# Patient Record
Sex: Male | Born: 1950 | Race: Black or African American | Hispanic: No | Marital: Single | Smoking: Never smoker
Health system: Southern US, Community
[De-identification: ages and names within clinical notes are randomized; demographics above are authoritative.]

## PROBLEM LIST (undated history)

## (undated) DIAGNOSIS — J449 Chronic obstructive pulmonary disease, unspecified: Secondary | ICD-10-CM

## (undated) DIAGNOSIS — G319 Degenerative disease of nervous system, unspecified: Secondary | ICD-10-CM

## (undated) DIAGNOSIS — Z960 Presence of urogenital implants: Secondary | ICD-10-CM

## (undated) DIAGNOSIS — S78111A Complete traumatic amputation at level between right hip and knee, initial encounter: Secondary | ICD-10-CM

## (undated) DIAGNOSIS — Z978 Presence of other specified devices: Secondary | ICD-10-CM

## (undated) DIAGNOSIS — N184 Chronic kidney disease, stage 4 (severe): Secondary | ICD-10-CM

## (undated) DIAGNOSIS — I480 Paroxysmal atrial fibrillation: Secondary | ICD-10-CM

## (undated) DIAGNOSIS — I5020 Unspecified systolic (congestive) heart failure: Secondary | ICD-10-CM

## (undated) DIAGNOSIS — Z931 Gastrostomy status: Secondary | ICD-10-CM

## (undated) DIAGNOSIS — G8220 Paraplegia, unspecified: Secondary | ICD-10-CM

## (undated) DIAGNOSIS — Z930 Tracheostomy status: Secondary | ICD-10-CM

---

## 2017-01-22 ENCOUNTER — Emergency Department (HOSPITAL_COMMUNITY): Payer: Medicare Other

## 2017-01-22 ENCOUNTER — Inpatient Hospital Stay (HOSPITAL_COMMUNITY)
Admission: EM | Admit: 2017-01-22 | Discharge: 2017-01-25 | DRG: 698 | Disposition: A | Discharge status: Other Acute Inpatient Hospital | Payer: Medicare Other | Attending: Pulmonary Disease | Admitting: Pulmonary Disease

## 2017-01-22 DIAGNOSIS — J151 Pneumonia due to Pseudomonas: Secondary | ICD-10-CM | POA: Diagnosis not present

## 2017-01-22 DIAGNOSIS — Z79899 Other long term (current) drug therapy: Secondary | ICD-10-CM

## 2017-01-22 DIAGNOSIS — A419 Sepsis, unspecified organism: Secondary | ICD-10-CM | POA: Diagnosis present

## 2017-01-22 DIAGNOSIS — E872 Acidosis, unspecified: Secondary | ICD-10-CM

## 2017-01-22 DIAGNOSIS — D638 Anemia in other chronic diseases classified elsewhere: Secondary | ICD-10-CM | POA: Diagnosis not present

## 2017-01-22 DIAGNOSIS — Z930 Tracheostomy status: Secondary | ICD-10-CM

## 2017-01-22 DIAGNOSIS — G8250 Quadriplegia, unspecified: Secondary | ICD-10-CM | POA: Diagnosis not present

## 2017-01-22 DIAGNOSIS — N390 Urinary tract infection, site not specified: Secondary | ICD-10-CM

## 2017-01-22 DIAGNOSIS — D62 Acute posthemorrhagic anemia: Secondary | ICD-10-CM | POA: Diagnosis not present

## 2017-01-22 DIAGNOSIS — I482 Chronic atrial fibrillation: Secondary | ICD-10-CM | POA: Diagnosis present

## 2017-01-22 DIAGNOSIS — Y838 Other surgical procedures as the cause of abnormal reaction of the patient, or of later complication, without mention of misadventure at the time of the procedure: Secondary | ICD-10-CM | POA: Diagnosis present

## 2017-01-22 DIAGNOSIS — J440 Chronic obstructive pulmonary disease with acute lower respiratory infection: Secondary | ICD-10-CM | POA: Diagnosis not present

## 2017-01-22 DIAGNOSIS — D6489 Other specified anemias: Secondary | ICD-10-CM | POA: Diagnosis present

## 2017-01-22 DIAGNOSIS — N179 Acute kidney failure, unspecified: Secondary | ICD-10-CM | POA: Diagnosis not present

## 2017-01-22 DIAGNOSIS — Y95 Nosocomial condition: Secondary | ICD-10-CM | POA: Diagnosis not present

## 2017-01-22 DIAGNOSIS — R680 Hypothermia, not associated with low environmental temperature: Secondary | ICD-10-CM | POA: Diagnosis present

## 2017-01-22 DIAGNOSIS — N183 Chronic kidney disease, stage 3 (moderate): Secondary | ICD-10-CM | POA: Diagnosis present

## 2017-01-22 DIAGNOSIS — E039 Hypothyroidism, unspecified: Secondary | ICD-10-CM | POA: Diagnosis not present

## 2017-01-22 DIAGNOSIS — R6521 Severe sepsis with septic shock: Secondary | ICD-10-CM | POA: Diagnosis not present

## 2017-01-22 DIAGNOSIS — R739 Hyperglycemia, unspecified: Secondary | ICD-10-CM | POA: Diagnosis present

## 2017-01-22 DIAGNOSIS — R578 Other shock: Secondary | ICD-10-CM | POA: Diagnosis present

## 2017-01-22 DIAGNOSIS — L89159 Pressure ulcer of sacral region, unspecified stage: Secondary | ICD-10-CM | POA: Diagnosis present

## 2017-01-22 DIAGNOSIS — T83518A Infection and inflammatory reaction due to other urinary catheter, initial encounter: Principal | ICD-10-CM | POA: Diagnosis present

## 2017-01-22 DIAGNOSIS — I5022 Chronic systolic (congestive) heart failure: Secondary | ICD-10-CM | POA: Diagnosis present

## 2017-01-22 DIAGNOSIS — D72829 Elevated white blood cell count, unspecified: Secondary | ICD-10-CM

## 2017-01-22 DIAGNOSIS — Z7951 Long term (current) use of inhaled steroids: Secondary | ICD-10-CM

## 2017-01-22 DIAGNOSIS — T83511A Infection and inflammatory reaction due to indwelling urethral catheter, initial encounter: Secondary | ICD-10-CM

## 2017-01-22 DIAGNOSIS — J189 Pneumonia, unspecified organism: Secondary | ICD-10-CM

## 2017-01-22 DIAGNOSIS — G319 Degenerative disease of nervous system, unspecified: Secondary | ICD-10-CM | POA: Diagnosis not present

## 2017-01-22 DIAGNOSIS — L89610 Pressure ulcer of right heel, unstageable: Secondary | ICD-10-CM | POA: Diagnosis present

## 2017-01-22 DIAGNOSIS — J9621 Acute and chronic respiratory failure with hypoxia: Secondary | ICD-10-CM | POA: Diagnosis not present

## 2017-01-22 DIAGNOSIS — R1310 Dysphagia, unspecified: Secondary | ICD-10-CM | POA: Diagnosis present

## 2017-01-22 DIAGNOSIS — J9600 Acute respiratory failure, unspecified whether with hypoxia or hypercapnia: Secondary | ICD-10-CM

## 2017-01-22 DIAGNOSIS — R4182 Altered mental status, unspecified: Secondary | ICD-10-CM | POA: Diagnosis present

## 2017-01-22 DIAGNOSIS — Z931 Gastrostomy status: Secondary | ICD-10-CM

## 2017-01-22 DIAGNOSIS — L89314 Pressure ulcer of right buttock, stage 4: Secondary | ICD-10-CM | POA: Insufficient documentation

## 2017-01-22 DIAGNOSIS — J9690 Respiratory failure, unspecified, unspecified whether with hypoxia or hypercapnia: Secondary | ICD-10-CM

## 2017-01-22 DIAGNOSIS — E876 Hypokalemia: Secondary | ICD-10-CM | POA: Diagnosis present

## 2017-01-22 LAB — PROCALCITONIN
PROCALCITONIN: 12.21 ng/mL
Procalcitonin: 16.95 ng/mL

## 2017-01-22 LAB — CBC WITH DIFFERENTIAL/PLATELET
BASOS PCT: 0 %
Basophils Absolute: 0 10*3/uL (ref 0.0–0.1)
EOS ABS: 0.1 10*3/uL (ref 0.0–0.7)
EOS PCT: 1 %
HCT: 16.9 % — ABNORMAL LOW (ref 39.0–52.0)
HEMOGLOBIN: 4.6 g/dL — AB (ref 13.0–17.0)
LYMPHS PCT: 11 %
Lymphs Abs: 1.3 10*3/uL (ref 0.7–4.0)
MCH: 19.8 pg — ABNORMAL LOW (ref 26.0–34.0)
MCHC: 27.2 g/dL — ABNORMAL LOW (ref 30.0–36.0)
MCV: 72.8 fL — AB (ref 78.0–100.0)
Monocytes Absolute: 1.1 10*3/uL — ABNORMAL HIGH (ref 0.1–1.0)
Monocytes Relative: 9 %
NEUTROS PCT: 79 %
Neutro Abs: 9.4 10*3/uL — ABNORMAL HIGH (ref 1.7–7.7)
PLATELETS: 149 10*3/uL — AB (ref 150–400)
RBC: 2.32 MIL/uL — AB (ref 4.22–5.81)
RDW: 20.6 % — ABNORMAL HIGH (ref 11.5–15.5)
WBC: 11.9 10*3/uL — AB (ref 4.0–10.5)

## 2017-01-22 LAB — URINALYSIS, ROUTINE W REFLEX MICROSCOPIC
Bilirubin Urine: NEGATIVE
GLUCOSE, UA: NEGATIVE mg/dL
Ketones, ur: NEGATIVE mg/dL
NITRITE: NEGATIVE
Specific Gravity, Urine: 1.013 (ref 1.005–1.030)
Squamous Epithelial / LPF: NONE SEEN
pH: 8 (ref 5.0–8.0)

## 2017-01-22 LAB — PREPARE RBC (CROSSMATCH)

## 2017-01-22 LAB — GLUCOSE, CAPILLARY: GLUCOSE-CAPILLARY: 130 mg/dL — AB (ref 65–99)

## 2017-01-22 LAB — IRON AND TIBC
IRON: 55 ug/dL (ref 45–182)
Saturation Ratios: 35 % (ref 17.9–39.5)
TIBC: 158 ug/dL — ABNORMAL LOW (ref 250–450)
UIBC: 103 ug/dL

## 2017-01-22 LAB — PROTIME-INR
INR: 1.26
INR: 1.32
PROTHROMBIN TIME: 16.4 s — AB (ref 11.4–15.2)
Prothrombin Time: 15.9 seconds — ABNORMAL HIGH (ref 11.4–15.2)

## 2017-01-22 LAB — COMPREHENSIVE METABOLIC PANEL
ALBUMIN: 1.6 g/dL — AB (ref 3.5–5.0)
ALT: 43 U/L (ref 17–63)
AST: 59 U/L — AB (ref 15–41)
Alkaline Phosphatase: 255 U/L — ABNORMAL HIGH (ref 38–126)
Anion gap: 10 (ref 5–15)
BUN: 81 mg/dL — AB (ref 6–20)
CHLORIDE: 90 mmol/L — AB (ref 101–111)
CO2: 32 mmol/L (ref 22–32)
Calcium: 7.9 mg/dL — ABNORMAL LOW (ref 8.9–10.3)
Creatinine, Ser: 2.66 mg/dL — ABNORMAL HIGH (ref 0.61–1.24)
GFR calc Af Amer: 28 mL/min — ABNORMAL LOW (ref >60)
GFR calc non Af Amer: 24 mL/min — ABNORMAL LOW (ref >60)
GLUCOSE: 182 mg/dL — AB (ref 65–99)
POTASSIUM: 3.8 mmol/L (ref 3.5–5.1)
SODIUM: 132 mmol/L — AB (ref 135–145)
Total Bilirubin: 1.5 mg/dL — ABNORMAL HIGH (ref 0.3–1.2)
Total Protein: 7 g/dL (ref 6.5–8.1)

## 2017-01-22 LAB — I-STAT ARTERIAL BLOOD GAS, ED
Acid-Base Excess: 5 mmol/L — ABNORMAL HIGH (ref 0.0–2.0)
Bicarbonate: 32.8 mmol/L — ABNORMAL HIGH (ref 20.0–28.0)
O2 SAT: 63 %
PCO2 ART: 61.8 mmHg — AB (ref 32.0–48.0)
PO2 ART: 31 mmHg — AB (ref 83.0–108.0)
Patient temperature: 34.2
TCO2: 35 mmol/L (ref 0–100)
pH, Arterial: 7.319 — ABNORMAL LOW (ref 7.350–7.450)

## 2017-01-22 LAB — I-STAT CG4 LACTIC ACID, ED
Lactic Acid, Venous: 2.6 mmol/L (ref 0.5–1.9)
Lactic Acid, Venous: 3.08 mmol/L (ref 0.5–1.9)

## 2017-01-22 LAB — TROPONIN I: Troponin I: 0.11 ng/mL (ref <0.03)

## 2017-01-22 LAB — ABO/RH: ABO/RH(D): O POS

## 2017-01-22 LAB — LACTIC ACID, PLASMA
LACTIC ACID, VENOUS: 3 mmol/L — AB (ref 0.5–1.9)
Lactic Acid, Venous: 2.2 mmol/L (ref 0.5–1.9)

## 2017-01-22 LAB — FOLATE: FOLATE: 60.6 ng/mL (ref >5.9)

## 2017-01-22 LAB — VITAMIN B12: VITAMIN B 12: 1148 pg/mL — AB (ref 180–914)

## 2017-01-22 LAB — POC OCCULT BLOOD, ED: Fecal Occult Bld: POSITIVE — AB

## 2017-01-22 LAB — MRSA PCR SCREENING: MRSA by PCR: NEGATIVE

## 2017-01-22 LAB — PHOSPHORUS: PHOSPHORUS: 2.4 mg/dL — AB (ref 2.5–4.6)

## 2017-01-22 LAB — APTT
aPTT: 40 seconds — ABNORMAL HIGH (ref 24–36)
aPTT: 41 seconds — ABNORMAL HIGH (ref 24–36)

## 2017-01-22 LAB — MAGNESIUM: Magnesium: 3.3 mg/dL — ABNORMAL HIGH (ref 1.7–2.4)

## 2017-01-22 LAB — CORTISOL: Cortisol, Plasma: 16.6 ug/dL

## 2017-01-22 LAB — FERRITIN: Ferritin: >7500 ng/mL — ABNORMAL HIGH (ref 24–336)

## 2017-01-22 MED ORDER — PANTOPRAZOLE SODIUM 40 MG IV SOLR
40.0000 mg | Freq: Once | INTRAVENOUS | Status: DC
  Administered 2017-01-22: 40 mg via INTRAVENOUS
  Filled 2017-01-22: qty 40

## 2017-01-22 MED ORDER — ORAL CARE MOUTH RINSE
15.0000 mL | Freq: Four times a day (QID) | OROMUCOSAL | Status: DC
  Administered 2017-01-23 – 2017-01-25 (×11): 15 mL via OROMUCOSAL

## 2017-01-22 MED ORDER — NOREPINEPHRINE BITARTRATE 1 MG/ML IV SOLN
0.0000 ug/min | Freq: Once | INTRAVENOUS | Status: AC
  Administered 2017-01-22: 2 ug/min via INTRAVENOUS
  Filled 2017-01-22: qty 4

## 2017-01-22 MED ORDER — VANCOMYCIN HCL IN DEXTROSE 1-5 GM/200ML-% IV SOLN
1000.0000 mg | INTRAVENOUS | Status: DC
  Administered 2017-01-23: 1000 mg via INTRAVENOUS
  Filled 2017-01-22: qty 200

## 2017-01-22 MED ORDER — SODIUM CHLORIDE 0.9 % IV SOLN
250.0000 mL | INTRAVENOUS | Status: DC | PRN
  Administered 2017-01-22: 250 mL via INTRAVENOUS

## 2017-01-22 MED ORDER — PIPERACILLIN-TAZOBACTAM 3.375 G IVPB 30 MIN
3.3750 g | Freq: Once | INTRAVENOUS | Status: AC
  Administered 2017-01-22: 3.375 g via INTRAVENOUS

## 2017-01-22 MED ORDER — FERROUS SULFATE 300 (60 FE) MG/5ML PO SYRP
300.0000 mg | ORAL_SOLUTION | Freq: Every day | ORAL | Status: DC
  Administered 2017-01-23 – 2017-01-25 (×3): 300 mg
  Filled 2017-01-22 (×3): qty 5

## 2017-01-22 MED ORDER — SODIUM CHLORIDE 0.9 % IV SOLN: 500.0000 mL | INTRAVENOUS | Status: DC | PRN

## 2017-01-22 MED ORDER — VANCOMYCIN HCL 10 G IV SOLR
2000.0000 mg | Freq: Once | INTRAVENOUS | Status: AC
  Administered 2017-01-22: 2000 mg via INTRAVENOUS
  Filled 2017-01-22: qty 2000

## 2017-01-22 MED ORDER — VANCOMYCIN HCL 10 G IV SOLR: 20.0000 mg/kg | Freq: Once | INTRAVENOUS | Status: DC

## 2017-01-22 MED ORDER — PIPERACILLIN-TAZOBACTAM 3.375 G IVPB
3.3750 g | Freq: Three times a day (TID) | INTRAVENOUS | Status: DC
  Administered 2017-01-22 – 2017-01-25 (×8): 3.375 g via INTRAVENOUS
  Filled 2017-01-22 (×9): qty 50

## 2017-01-22 MED ORDER — SODIUM CHLORIDE 0.9 % IV SOLN: INTRAVENOUS | Status: DC

## 2017-01-22 MED ORDER — CHLORHEXIDINE GLUCONATE 0.12% ORAL RINSE (MEDLINE KIT)
15.0000 mL | Freq: Two times a day (BID) | OROMUCOSAL | Status: DC
  Administered 2017-01-22 – 2017-01-25 (×6): 15 mL via OROMUCOSAL

## 2017-01-22 MED ORDER — PANTOPRAZOLE SODIUM 40 MG IV SOLR
40.0000 mg | Freq: Every day | INTRAVENOUS | Status: DC
  Administered 2017-01-22 – 2017-01-23 (×2): 40 mg via INTRAVENOUS
  Filled 2017-01-22 (×2): qty 40

## 2017-01-22 MED ORDER — INSULIN ASPART 100 UNIT/ML ~~LOC~~ SOLN
0.0000 [IU] | SUBCUTANEOUS | Status: DC
  Administered 2017-01-22 – 2017-01-25 (×5): 2 [IU] via SUBCUTANEOUS

## 2017-01-22 MED ORDER — LACTATED RINGERS IV SOLN
INTRAVENOUS | Status: DC
  Administered 2017-01-22 – 2017-01-24 (×4): via INTRAVENOUS

## 2017-01-22 MED ORDER — SODIUM CHLORIDE 0.9 % IV BOLUS (SEPSIS)
2000.0000 mL | Freq: Once | INTRAVENOUS | Status: AC
  Administered 2017-01-22: 2000 mL via INTRAVENOUS

## 2017-01-22 MED ORDER — SODIUM CHLORIDE 0.9 % IV SOLN
Freq: Once | INTRAVENOUS | Status: AC
  Administered 2017-01-23: 03:00:00 via INTRAVENOUS

## 2017-01-22 MED ORDER — SODIUM CHLORIDE 0.9 % IV BOLUS (SEPSIS): 1700.0000 mL | Freq: Once | INTRAVENOUS | Status: DC

## 2017-01-22 MED ORDER — NOREPINEPHRINE BITARTRATE 1 MG/ML IV SOLN
0.0000 ug/min | INTRAVENOUS | Status: DC
  Administered 2017-01-23: 12 ug/min via INTRAVENOUS
  Filled 2017-01-22: qty 4

## 2017-01-22 MED ORDER — PIPERACILLIN-TAZOBACTAM 4.5 G IVPB
3.3750 g | Freq: Once | INTRAVENOUS | Status: DC
  Filled 2017-01-22: qty 100

## 2017-01-22 MED ORDER — LEVOTHYROXINE SODIUM 75 MCG PO TABS
75.0000 ug | ORAL_TABLET | Freq: Every day | ORAL | Status: DC
  Administered 2017-01-23 – 2017-01-25 (×3): 75 ug
  Filled 2017-01-22 (×3): qty 1

## 2017-01-22 NOTE — Progress Notes (Signed)
Pt stuck for ABG, sample was venous, CCM NP aware. Increased rate to 24 due to increased CO2. Will cont to monitor

## 2017-01-22 NOTE — Progress Notes (Signed)
CRITICAL VALUE ALERT  Critical value received:  Troponin 0.11, Lactic acid 3.0  Date of notification:  01/22/2017  Time of notification:  21:40  Critical value read back:Yes.    Nurse who received alert:  BShepherd, RN  MD notified (1st page):  Sommer  Time of first page:  21:55  MD notified (2nd page):  Time of second page:  Responding MD:  Arsenio LoaderSommer  Time MD responded:  21:55

## 2017-01-22 NOTE — ED Provider Notes (Signed)
I saw and evaluated the Craig Oneal, reviewed the resident's note and I agree with the findings and plan with the following exceptions.   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 Craig Oneal. Had decreased mental status over the last couple days was found to have gram-negative rods in his urine had not been started on antibiotic she had. Arrival here the Craig Oneal is tachycardic, hypotensive, hypothermic. He is minimally responsive to verbal stimuli but will follow commands when he is awake. His extremities are slightly cool to touch but not cold. Has mild bibasilar crackles. Abdomen is distended and tympanic. We'll start treatment for sepsis. Likely urinary source however has multiple other abnormalities so we'll do broad-spectrum antibiotics including vancomycin and Zosyn. We'll resuscitate with 30 mL/kg of normal saline Craig Oneal has a PICC line which is recently placed so I doubt that it is infected but can likely be used for pressors if needed.  Craig Oneal is a full code for now but family would consider having a limited scope of care depending on the clinical situation.  Craig Oneal will require admission to ICU for further management once resuscitation efforts have begun.   CRITICAL CARE Performed by: Marily MemosMesner, Deshanae Lindo Total critical care time: 32 minutes Critical care time was exclusive of separately billable procedures and treating other patients. Critical care was necessary to treat or prevent imminent or life-threatening deterioration. Critical care was time spent personally by me on the following activities: development of treatment plan with Craig Oneal and/or surrogate as well as nursing, discussions with consultants, evaluation of Craig Oneal's response to treatment, examination of Craig Oneal, obtaining history from Craig Oneal or surrogate, ordering and performing treatments and interventions, ordering and review  of laboratory studies, ordering and review of radiographic studies, pulse oximetry and re-evaluation of Craig Oneal's condition.    EKG Interpretation  Date/Time:  Saturday Jan 22 2017 16:19:09 EDT Ventricular Rate:  104 PR Interval:    QRS Duration: 96 QT Interval:  381 QTC Calculation: 502 R Axis:   8 Text Interpretation:  Atrial fibrillation Ventricular premature complex Low voltage, precordial leads Nonspecific T abnormalities, lateral leads Prolonged QT interval Confirmed by Marily MemosMesner, Tory Septer [phone]) on 01/22/2017 6:05:28 PM         Harol Shabazz, Barbara CowerJason, MD 01/22/17 2237

## 2017-01-22 NOTE — ED Provider Notes (Signed)
[facility] DEPT Provider Note   CSN: [id number] Arrival date & time: 01/22/17  1606     History   Chief Complaint Chief Complaint  Patient presents with  . Urinary Tract Infection    HPI Craig See. is a 66 y.o. male.  Patient with history of quadriplegia and currently been staying at [facility] and transferred to [facility] following three days of fever and found to have low blood pressure today. Patient had PICC line place three days ago and antibiotics started for likely UTI. Patient usually alert and able to follow commands and today with decreased level on consciousness.    The history is provided by the EMS personnel and the nursing home.  Illness  The current episode started more than 2 days ago. The problem occurs daily. The problem has been gradually worsening. Nothing aggravates the symptoms. Nothing relieves the symptoms.    No past medical history on file.  Patient Active Problem List   Diagnosis Date Noted  . Septic shock ([place]) 01/22/2017    No past surgical history on file.     Home Medications    Prior to Admission medications   Medication Sig Start Date End Date Taking? Authorizing Provider  acetaminophen (TYLENOL) 325 MG tablet Place 650 mg into feeding tube.   Yes [provider]  aluminum-magnesium hydroxide 200-200 MG/5ML suspension Place 30 mLs into feeding tube.   Yes [provider]  atorvastatin (LIPITOR) 10 MG tablet Place 10 mg into feeding tube.   Yes [provider]  baclofen (LIORESAL) 5 mg TABS tablet Place 5 mg into feeding tube.   Yes [provider]  budesonide (PULMICORT) 0.25 MG/2ML nebulizer solution 0.25 mg.   Yes [provider]  chlorhexidine (PERIDEX) 0.12 % solution Use as directed 15 mLs in the mouth or throat.   Yes [provider]  clonazePAM (KLONOPIN) 0.5 MG tablet Place 0.25 mg into feeding tube.   Yes [provider]  Darbepoetin Alfa  (ARANESP) 100 MCG/0.5ML SOSY injection Inject 100 mcg into the skin.   Yes [provider]  Docusate Sodium 100 MG capsule Place 100 mg into feeding tube.   Yes [provider]  ferrous sulfate 300 (60 Fe) MG/5ML syrup Place 300 mg into feeding tube.   Yes [provider]  fluticasone (FLONASE) 50 MCG/ACT nasal spray Place 2 sprays into both nostrils.   Yes [provider]  furosemide (LASIX) 20 MG tablet Place 20 mg into feeding tube.   Yes [provider]  ipratropium-albuterol (DUONEB) 0.5-2.5 (3) MG/3ML SOLN Take 3 mLs by nebulization.   Yes [provider]  levothyroxine (SYNTHROID, LEVOTHROID) 75 MCG tablet Place 75 mcg into feeding tube.   Yes [provider]  loperamide (IMODIUM A-D) 2 MG tablet Place 2 mg into feeding tube.   Yes [provider]  loratadine (CLARITIN) 10 MG tablet Place 10 mg into feeding tube.   Yes [provider]  metoprolol tartrate (LOPRESSOR) 25 MG tablet Place 25 mg into feeding tube.   Yes [provider]  montelukast (SINGULAIR) 10 MG tablet Place 10 mg into feeding tube.   Yes [provider]  Multiple Vitamin (MULTIVITAMIN WITH MINERALS) TABS tablet Place 1 tablet into feeding tube.   Yes [provider]  pantoprazole sodium (PROTONIX) 40 mg/20 mL PACK Place 40 mg into feeding tube.   Yes [provider]  polyethylene glycol (MIRALAX / GLYCOLAX) packet Place 17 g into feeding tube.  Yes [provider]  potassium chloride in dextrose 5 % and 0.45% NaCl 1,000 mL Inject into the vein See admin instructions. Order date 01/20/17 - use 75 ml/hr intravenously   Yes [provider]  Protein POWD    Yes [provider]  Skin Protectants, Misc. (EUCERIN) cream Apply 1 application topically.   Yes [provider]  Vitamin D, Ergocalciferol, (DRISDOL) 50000 units CAPS capsule Place 50,000 Units into feeding tube.   Yes  [provider]    Family History No family history on file.  Social History Social History  Substance Use Topics  . Smoking status: Not on file  . Smokeless tobacco: Not on file  . Alcohol use Not on file     Allergies   Patient has no known allergies.   Review of Systems Review of Systems  Unable to perform ROS: Acuity of condition     Physical Exam Updated Vital Signs  ED Triage Vitals  Enc Vitals Group     BP 01/22/17 1611 (!) 74/47     Pulse Rate 01/22/17 1611 100     Resp 01/22/17 1611 15     Temp 01/22/17 1611 (!) 94.4 F (34.7 C)     Temp Source 01/22/17 1611 Rectal     SpO2 01/22/17 1608 100 %     Weight 01/22/17 1608 191 lb (86.6 kg)     Height 01/22/17 1608 5' 8"  (1.727 m)     Head Circumference --      Peak Flow --      Pain Score --      Pain Loc --      Pain Edu? --      Excl. in [place]? --     Physical Exam  Constitutional: He appears distressed.  HENT:  Head: Normocephalic and atraumatic.  Eyes: EOM are normal. Pupils are equal, round, and reactive to light.  Neck: Normal range of motion. Neck supple.  Trach in place on home vent settings  Cardiovascular: Normal heart sounds and intact distal pulses.  Tachycardia present.   Pulmonary/Chest: Breath sounds normal. No respiratory distress. He has no wheezes.  Abdominal: Soft. He exhibits no distension.  Musculoskeletal: He exhibits no edema.  Neurological: He is alert. GCS eye subscore is 4. GCS verbal subscore is 5. GCS motor subscore is 6.  Skin: Capillary refill takes less than 2 seconds. No rash noted. He is diaphoretic.     ED Treatments / Results  Labs (all labs ordered are listed, but only abnormal results are displayed) Labs Reviewed  COMPREHENSIVE METABOLIC PANEL - Abnormal; Notable for the following:       Result Value   Sodium 132 (*)    Chloride 90 (*)    Glucose, Bld 182 (*)    BUN 81 (*)    Creatinine, Ser 2.66 (*)    Calcium 7.9 (*)    Albumin 1.6 (*)    AST  59 (*)    Alkaline Phosphatase 255 (*)    Total Bilirubin 1.5 (*)    GFR calc non Af Amer 24 (*)    GFR calc Af Amer 28 (*)    All other components within normal limits  CBC WITH DIFFERENTIAL/PLATELET - Abnormal; Notable for the following:    WBC 11.9 (*)    RBC 2.32 (*)    Hemoglobin 4.6 (*)    HCT 16.9 (*)    MCV 72.8 (*)    MCH 19.8 (*)    MCHC 27.2 (*)  RDW 20.6 (*)    Platelets 149 (*)    Neutro Abs 9.4 (*)    Monocytes Absolute 1.1 (*)    All other components within normal limits  PROTIME-INR - Abnormal; Notable for the following:    Prothrombin Time 15.9 (*)    All other components within normal limits  URINALYSIS, ROUTINE W REFLEX MICROSCOPIC - Abnormal; Notable for the following:    Color, Urine AMBER (*)    APPearance TURBID (*)    Hgb urine dipstick SMALL (*)    Protein, ur >=300 (*)    Leukocytes, UA LARGE (*)    Bacteria, UA MANY (*)    All other components within normal limits  APTT - Abnormal; Notable for the following:    aPTT 40 (*)    All other components within normal limits  LACTIC ACID, PLASMA - Abnormal; Notable for the following:    Lactic Acid, Venous 3.0 (*)    All other components within normal limits  LACTIC ACID, PLASMA - Abnormal; Notable for the following:    Lactic Acid, Venous 2.2 (*)    All other components within normal limits  TROPONIN I - Abnormal; Notable for the following:    Troponin I 0.11 (*)    All other components within normal limits  PROTIME-INR - Abnormal; Notable for the following:    Prothrombin Time 16.4 (*)    All other components within normal limits  APTT - Abnormal; Notable for the following:    aPTT 41 (*)    All other components within normal limits  MAGNESIUM - Abnormal; Notable for the following:    Magnesium 3.3 (*)    All other components within normal limits  PHOSPHORUS - Abnormal; Notable for the following:    Phosphorus 2.4 (*)    All other components within normal limits  VITAMIN B12 - Abnormal;  Notable for the following:    Vitamin B-12 1,148 (*)    All other components within normal limits  IRON AND TIBC - Abnormal; Notable for the following:    TIBC 158 (*)    All other components within normal limits  FERRITIN - Abnormal; Notable for the following:    Ferritin >7,500 (*)    All other components within normal limits  GLUCOSE, CAPILLARY - Abnormal; Notable for the following:    Glucose-Capillary 130 (*)    All other components within normal limits  CBC - Abnormal; Notable for the following:    WBC 15.4 (*)    RBC 3.10 (*)    Hemoglobin 6.6 (*)    HCT 23.2 (*)    MCV 74.8 (*)    MCH 21.3 (*)    MCHC 28.4 (*)    RDW 20.2 (*)    All other components within normal limits  I-STAT CG4 LACTIC ACID, ED - Abnormal; Notable for the following:    Lactic Acid, Venous 2.60 (*)    All other components within normal limits  I-STAT ARTERIAL BLOOD GAS, ED - Abnormal; Notable for the following:    pH, Arterial 7.319 (*)    pCO2 arterial 61.8 (*)    pO2, Arterial 31.0 (*)    Bicarbonate 32.8 (*)    Acid-Base Excess 5.0 (*)    All other components within normal limits  POC OCCULT BLOOD, ED - Abnormal; Notable for the following:    Fecal Occult Bld POSITIVE (*)    All other components within normal limits  I-STAT CG4 LACTIC ACID, ED - Abnormal; Notable for the  following:    Lactic Acid, Venous 3.08 (*)    All other components within normal limits  CULTURE, BLOOD (ROUTINE X 2)  CULTURE, BLOOD (ROUTINE X 2)  CULTURE, RESPIRATORY (NON-EXPECTORATED)  MRSA PCR SCREENING  PROCALCITONIN  PROCALCITONIN  CORTISOL  HIV ANTIBODY (ROUTINE TESTING)  PROCALCITONIN  URINALYSIS, ROUTINE W REFLEX MICROSCOPIC  BLOOD GAS, ARTERIAL  CBC  BASIC METABOLIC PANEL  BLOOD GAS, ARTERIAL  MAGNESIUM  PHOSPHORUS  FOLATE  SAVE SMEAR  TYPE AND SCREEN  ABO/RH  PREPARE RBC (CROSSMATCH)    EKG  EKG Interpretation  Date/Time:  Saturday Jan 22 2017 16:19:09 EDT Ventricular Rate:  104 PR  Interval:    QRS Duration: 96 QT Interval:  381 QTC Calculation: 502 R Axis:   8 Text Interpretation:  Atrial fibrillation Ventricular premature complex Low voltage, precordial leads Nonspecific T abnormalities, lateral leads Prolonged QT interval Confirmed by Merrily Pew [phone]) on 01/22/2017 6:05:28 PM       Radiology Dg Abdomen 1 View  Result Date: 01/22/2017 CLINICAL DATA:  Fever for 2 days.  UTI.  Sepsis. EXAM: ABDOMEN - 1 VIEW COMPARISON:  None. FINDINGS: Normal bowel gas pattern. Gastrostomy tube projects in the central abdomen over the superior aspect of the gastric air shadow. Soft tissues are unremarkable.  No acute skeletal abnormality. IMPRESSION: No acute findings.  No bowel obstruction. Electronically Signed   By: Lajean Manes M.D.   On: 01/22/2017 18:10   Dg Chest Port 1 View  Result Date: 01/22/2017 CLINICAL DATA:  Fever EXAM: PORTABLE CHEST 1 VIEW COMPARISON:  None. FINDINGS: Cardiac shadow is enlarged. Right-sided PICC line is noted in the mid right atrium. Tracheostomy tube is noted in satisfactory position. Diffuse bilateral perihilar density which may be related to pulmonary edema or diffuse bilateral infiltrates. No focal confluent infiltrate is seen. No bony abnormality is noted. IMPRESSION: Diffuse perihilar densities consistent with infiltrate/edema. Follow-up imaging is recommended. Electronically Signed   By: Inez Catalina M.D.   On: 01/22/2017 18:09    Procedures Procedures (including critical care time)  Medications Ordered in ED Medications  0.9 %  sodium chloride infusion (250 mLs Intravenous New Bag/Given 01/22/17 2200)  insulin aspart (novoLOG) injection 0-15 Units (2 Units Subcutaneous Given 01/22/17 2059)  pantoprazole (PROTONIX) injection 40 mg (40 mg Intravenous Given 01/22/17 2229)  lactated ringers infusion ( Intravenous Rate/Dose Verify 01/22/17 2100)  0.9 %  sodium chloride infusion (not administered)  piperacillin-tazobactam (ZOSYN) IVPB 3.375 g  (3.375 g Intravenous New Bag/Given 01/22/17 2230)  vancomycin (VANCOCIN) IVPB 1000 mg/200 mL premix (not administered)  levothyroxine (SYNTHROID, LEVOTHROID) tablet 75 mcg (not administered)  ferrous sulfate 300 (60 Fe) MG/5ML syrup 300 mg (not administered)  chlorhexidine gluconate (MEDLINE KIT) (PERIDEX) 0.12 % solution 15 mL (15 mLs Mouth Rinse Given 01/22/17 2214)  MEDLINE mouth rinse (not administered)  norepinephrine (LEVOPHED) 4 mg in dextrose 5 % 250 mL (0.016 mg/mL) infusion (not administered)  0.9 %  sodium chloride infusion (not administered)  vancomycin (VANCOCIN) 2,000 mg in sodium chloride 0.9 % 500 mL IVPB (0 mg Intravenous Stopped 01/22/17 1952)  sodium chloride 0.9 % bolus 2,000 mL (0 mLs Intravenous Stopped 01/22/17 1758)  piperacillin-tazobactam (ZOSYN) IVPB 3.375 g (0 g Intravenous Stopped 01/22/17 1733)  norepinephrine (LEVOPHED) 4 mg in dextrose 5 % 250 mL (0.016 mg/mL) infusion (12 mcg/min Intravenous Rate/Dose Change 01/22/17 2218)     Initial Impression / Assessment and Plan / ED Course  I have reviewed the triage vital signs and the nursing notes.  Pertinent labs & imaging results that were available during my care of the patient were reviewed by me and considered in my medical decision making (see chart for details).     Craig J Isay Perleberg. is a 66 year old male with history of spinal cerebellar ataxia status post trach, PEG, chronic indwelling Foley catheter who presents to the ED from outside long-term facility for hypotension and urinary tract infection. Patient's vitals at time of arrival to the ED are significant for tachycardia, hypotension, hypothermia and concerned for severe sepsis/septic shock. According to EMS patient has had fever for the last several days and had a PICC placed 3 days ago and found to have urinary tract infection with gram-negative rods. Patient was to be started on Zosyn today but never got antibiotics and sent here as his blood pressure  decreased. Patient with chronic indwelling Foley catheter. Patient alert, able to follow commands. Overall exam is unremarkable, patient is on home vent settings. Patient has chronic right foot wound. Contacted outside facility and states that patient has been sick for the last several days but unaware of what antibiotics patient has been on. Concern for severe sepsis and sepsis order set ordered. Code sepsis called. Patient given 30 mL/kg of normal saline, empirically started on IV vancomycin and Zosyn. Urine studies, blood cultures, chest x-ray ordered.  Patient with chest x-ray showing signs concerning for edema versus infiltrates. Patient with mild leukocytosis at 11.9 and lactic acidosis at 2.6. Patient also with signs of urinary tract infection with many leukocytes and many bacteria. Patient with hemoglobin of 4.6 and emergently given 2 units of packed red blood cells. No grossly melanotic or bloody stool on exam however hemoccult was positive. Patient was given IV Protonix. After 30 mL of fluid per kilogram patient still hypotensive and was started on IV Levophed. Type and screen was sent. Facility was recalled and state that hemoglobin yesterday was 8.6. Patient has history of chronic anemia however concern for anemia chronic illness versus acute GI process. Patient had elevated troponin and EKG show atrial fibrillation with no signs of acute anemia. Troponin elevation likely from demand. Patient with improved blood pressure following blood products and levophed. Patient admitted to the medical ICU for further care. Patient with repeat lactic acidosis increased to 3.0. Patient transferred to ICU in hemodynamically stable but patient in critical condition.   Final Clinical Impressions(s) / ED Diagnoses   Final diagnoses:  Sepsis ([place])  Septic shock ([place])  Urinary tract infection associated with catheterization of urinary tract, unspecified indwelling urinary catheter type, initial encounter ([place])   HAP (hospital-acquired pneumonia)  Leukocytosis, unspecified type  Lactic acidosis  AKI (acute kidney injury) ([place])  Acute blood loss anemia    New Prescriptions Current Discharge Medication List       Lennice Sites, DO 01/22/17 2338    Mesner, Corene Cornea, MD 01/23/17 1746

## 2017-01-22 NOTE — ED Notes (Signed)
MD notified of pt hemoglobin, ordered 2 units of emergency release.

## 2017-01-22 NOTE — ED Notes (Signed)
Both sets of blood cultures collected early today at 1630.  Duplicate order put in and collected in error.

## 2017-01-22 NOTE — H&P (Addendum)
PULMONARY / CRITICAL CARE MEDICINE   Name: Craig Oneal MRN: [id number] DOB: 09/27/1950    ADMISSION DATE:  01/22/2017 CONSULTATION DATE:  01/22/17  REFERRING MD:  Carolynn ServeJ Mesner MD  CHIEF COMPLAINT:  Septic shock  HISTORY OF PRESENT ILLNESS:   66 year old with history of cerebellar atrophy, chronic trach, PEG patient transferred from [facility] with 3 days of fever, hypotension. Noted to have positive UA with gram-negative rods at [facility] and he was started on zosyn. In the ED patient noted to be tachycardic, hypotensive, hypothermic altered mental status. Started on [employer] Zosyn. Labs significant for hemoglobin of 4.6, lactic acid 2.6, creatinine 2.66. 30cc/kg  fluid bolus given and PRBC ordered. PCCM called for admission.  PAST MEDICAL HISTORY :  He  has no past medical history on file.  PAST SURGICAL HISTORY: He  has no past surgical history on file.  No Known Allergies  No current facility-administered medications on file prior to encounter.    No current outpatient prescriptions on file prior to encounter.    FAMILY HISTORY:  His has no family status information on file.    SOCIAL HISTORY: He    REVIEW OF SYSTEMS:   Unable to obtain as patient is on trach, vented  SUBJECTIVE:    VITAL SIGNS: BP (!) 79/58   Pulse 88   Temp (!) 93.6 F (34.2 C)   Resp 17   Ht 5\' 8"  (1.727 m)   Wt 191 lb (86.6 kg)   SpO2 99%   BMI 29.04 kg/m   HEMODYNAMICS:   VENTILATOR SETTINGS: Vent Mode: PRVC FiO2 (%):  [35 %] 35 % Set Rate:  [12 bmp] 12 bmp Vt Set:  [500 mL] 500 mL PEEP:  [5 cmH20] 5 cmH20 Plateau Pressure:  [9 cmH20] 9 cmH20  INTAKE / OUTPUT: No intake/output data recorded.  PHYSICAL EXAMINATION: Gen:      No acute distress, obese HEENT:  EOMI, sclera anicteric Neck:     No masses; no thyromegaly Lungs:    Clear to auscultation bilaterally; normal respiratory effort CV:         Regular rate and rhythm; no murmurs Abd:      Distended + bowel sounds; soft,  non-tender; no palpable masses Ext:    1-2+ edema; adequate peripheral perfusion Skin:      Warm and dry; no rash Neuro: Awake, obese simple commands, nonfocal  LABS:  BMET  Recent Labs Lab 01/22/17 1626  NA 132*  K 3.8  CL 90*  CO2 32  BUN 81*  CREATININE 2.66*  GLUCOSE 182*    Electrolytes  Recent Labs Lab 01/22/17 1626  CALCIUM 7.9*    CBC  Recent Labs Lab 01/22/17 1626  WBC 11.9*  HGB 4.6*  HCT 16.9*  PLT 149*    Coag's  Recent Labs Lab 01/22/17 1626  APTT 40*  INR 1.26    Sepsis Markers  Recent Labs Lab 01/22/17 1626 01/22/17 1642  LATICACIDVEN  --  2.60*  PROCALCITON 16.95  --     ABG No results for input(s): PHART, PCO2ART, PO2ART in the last 168 hours.  Liver Enzymes  Recent Labs Lab 01/22/17 1626  AST 59*  ALT 43  ALKPHOS 255*  BILITOT 1.5*  ALBUMIN 1.6*    Cardiac Enzymes No results for input(s): TROPONINI, PROBNP in the last 168 hours.  Glucose No results for input(s): GLUCAP in the last 168 hours.  Imaging No results found.   STUDIES:  Chest x-ray 5/19> trach in place, dense bilateral  infiltrates suspicious for edema versus pneumonia Abdominal x-ray 5/19> no obstruction  CULTURES: Bcx ([facility]) 5/16 > Negative Ucx ([facility]) 5/17 > GNR  Blood cultures 5/19 > Urine cultures 5/19 > Sputum cultures 5/19 >  ANTIBIOTICS: Vancomycin 5/19> Zosyn 5/19>   SIGNIFICANT EVENTS: 5/19> admitted from [facility]  LINES/TUBES:   DISCUSSION: 66 year old with cerebral atrophy, chronic trach PEG admitted with sepsis from HCAP pneumonia versus UTI. Also noted to have low hemoglobin with no obvious sign of bleed.  ASSESSMENT / PLAN:  PULMONARY A: Acute on chronic respiratory failure P:   Continue full vent support Check ABG. Follow chest x-ray  CARDIOVASCULAR A:  Sepsis Elevated lactic acid P:  Telemetry monitoring Check troponins, EKG Follow lactic acid Fluid resuscitation. 30 mL/kg bolus and then LR at  75 Levophed for MAP > 65  RENAL A:   Elevated creatinine. Need to get baseline levels P:   Follow urine output and creatinine after fluid resuscitation  GASTROINTESTINAL A:   No major issues P:   Keep NPO for now Protonix for SUP  HEMATOLOGIC A:   Anemia. Hemoglobin 4.6 No sign of active bleeding P:  Transfuse 2 units PRBCs Goal hemoglobin greater than 7 Check DIC, Hemolysis panel and iron studies  INFECTIOUS A:   Septic shock from PNA vs UTI P:   Continue vanco, zosyn Follow cultures Obtain records from [facility]  ENDOCRINE A:   Hyperglycemia Hypothyroidism P:   SSI coverage IV synthyroid  NEUROLOGIC A:   Cerebellar atrophy P:   Monitor neuro status  FAMILY  - Updates: Son and daughter updated at bedside - Inter-disciplinary family meet or Palliative Care meeting due by: 5/26  The patient is critically ill with multiple organ system failure and requires high complexity decision making for assessment and support, frequent evaluation and titration of therapies, advanced monitoring, review of radiographic studies and interpretation of complex data.   Critical Care Time devoted to patient care services, exclusive of separately billable procedures, described in this note is 45 minutes.   Chilton Greathouse MD [facility] Pager [phone] If no answer or after 3pm call: [phone] 01/22/2017, 6:39 PM

## 2017-01-22 NOTE — ED Notes (Signed)
Pt received 1 liter by ems prior to arrival

## 2017-01-22 NOTE — Progress Notes (Signed)
eLink Physician-Brief Progress Note Patient Name: Craig DiegoCharlie J Hearne Jr. DOB: 1950-09-23 MRN: [id number]   Date of Service  01/22/2017  HPI/Events of Note  Anemia - Hgb = 6.6. Currently on Norepinephrine IV infusion.   eICU Interventions  Will order: 1. Transfuse 2 units PRBC now.      Intervention Category Major Interventions: Other:  Lenell AntuSommer,Markeya Mincy Eugene 01/22/2017, 10:49 PM

## 2017-01-22 NOTE — ED Notes (Signed)
Pt placed on bair hugger. Critical lab notification hemoglobin 4.6

## 2017-01-22 NOTE — Progress Notes (Signed)
eLink Physician-Brief Progress Note Patient Name: Craig Oneal. DOB: 1950-12-30 MRN: [id number]   Date of Service  01/22/2017  HPI/Events of Note  Troponin #1 = 1.1 (Demand Ischemia?) and 2. Lactic Acid = 3.0 (Related to anemia and +/- sepsis). Expect lactic acid to improve with PRBC transfusion.   eICU Interventions  Will order: 1. Continue to trend Troponin. 2. Continue to trend Lactic Acid level.      Intervention Category Intermediate Interventions: Diagnostic test evaluation  Lenell AntuSommer,Steven Eugene 01/22/2017, 9:58 PM

## 2017-01-22 NOTE — ED Triage Notes (Signed)
Per [facility], pt from [facility]. Called out for decrased LOC x3 days with hypotension. Pt has picc, foley, is trached. 70s systolic. Pt baseline is nonverbal, more interactive. Pt has known UTI, zosyn ordered at [facility] but not given.

## 2017-01-22 NOTE — Progress Notes (Signed)
Pharmacy Antibiotic Note  Craig Oneal is a 66 y.o. male admitted on 01/22/2017 with sepsis.  Pharmacy has been consulted for vanc and zosyn dosing. -hypothermic down to 92.2F -WBC 11.9 -SCr 2.66, CrCl ~30  Plan: -vanc 2 g x1, then 1g q24h -zosyn 3.375g q8h -monitor renal function, cultures, LOT -VT at steady state  Height: 5\' 8"  (172.7 cm) Weight: 191 lb (86.6 kg) IBW/kg (Calculated) : 68.4  Temp (24hrs), Avg:93.5 F (34.2 C), Min:92.8 F (33.8 C), Max:94.4 F (34.7 C)   Recent Labs Lab 01/22/17 1626 01/22/17 1642  WBC 11.9*  --   CREATININE 2.66*  --   LATICACIDVEN  --  2.60*    Estimated Creatinine Clearance: 30.4 mL/min (A) (by C-G formula based on SCr of 2.66 mg/dL (H)).    No Known Allergies  Antimicrobials this admission: Vanc 5/19>> Zosyn 5/19>>   Microbiology results: 5/19 BCx x2: sent 5/19 Trach aspirate: sent Thank you for allowing pharmacy to be a part of this patient's care.  Craig KaufmanKai Clovis Warwick Bernette Redbird(Kenny), PharmD  PGY1 Pharmacy Resident Pager: [phone] 01/22/2017 6:44 PM

## 2017-01-22 NOTE — ED Notes (Signed)
Per Critical care, to hold on levo if pt MAP is greater than 65

## 2017-01-23 ENCOUNTER — Inpatient Hospital Stay (HOSPITAL_COMMUNITY): Payer: Medicare Other

## 2017-01-23 DIAGNOSIS — T83518A Infection and inflammatory reaction due to other urinary catheter, initial encounter: Secondary | ICD-10-CM | POA: Diagnosis not present

## 2017-01-23 DIAGNOSIS — A419 Sepsis, unspecified organism: Secondary | ICD-10-CM | POA: Diagnosis not present

## 2017-01-23 DIAGNOSIS — L89314 Pressure ulcer of right buttock, stage 4: Secondary | ICD-10-CM | POA: Insufficient documentation

## 2017-01-23 DIAGNOSIS — R6521 Severe sepsis with septic shock: Secondary | ICD-10-CM | POA: Diagnosis not present

## 2017-01-23 LAB — GLUCOSE, CAPILLARY
GLUCOSE-CAPILLARY: 106 mg/dL — AB (ref 65–99)
GLUCOSE-CAPILLARY: 122 mg/dL — AB (ref 65–99)
GLUCOSE-CAPILLARY: 93 mg/dL (ref 65–99)
Glucose-Capillary: 106 mg/dL — ABNORMAL HIGH (ref 65–99)
Glucose-Capillary: 95 mg/dL (ref 65–99)
Glucose-Capillary: 122 mg/dL — ABNORMAL HIGH (ref 65–99)

## 2017-01-23 LAB — BASIC METABOLIC PANEL
ANION GAP: 10 (ref 5–15)
BUN: 71 mg/dL — ABNORMAL HIGH (ref 6–20)
CO2: 32 mmol/L (ref 22–32)
Calcium: 8 mg/dL — ABNORMAL LOW (ref 8.9–10.3)
Chloride: 95 mmol/L — ABNORMAL LOW (ref 101–111)
Creatinine, Ser: 2.09 mg/dL — ABNORMAL HIGH (ref 0.61–1.24)
GFR, EST AFRICAN AMERICAN: 37 mL/min — AB (ref >60)
GFR, EST NON AFRICAN AMERICAN: 32 mL/min — AB (ref >60)
GLUCOSE: 100 mg/dL — AB (ref 65–99)
POTASSIUM: 4 mmol/L (ref 3.5–5.1)
SODIUM: 137 mmol/L (ref 135–145)

## 2017-01-23 LAB — CBC
HCT: 23.2 % — ABNORMAL LOW (ref 39.0–52.0)
HEMATOCRIT: 27 % — AB (ref 39.0–52.0)
HEMOGLOBIN: 8.2 g/dL — AB (ref 13.0–17.0)
Hemoglobin: 6.6 g/dL — CL (ref 13.0–17.0)
MCH: 21.3 pg — AB (ref 26.0–34.0)
MCH: 22.8 pg — ABNORMAL LOW (ref 26.0–34.0)
MCHC: 28.4 g/dL — AB (ref 30.0–36.0)
MCHC: 30.4 g/dL (ref 30.0–36.0)
MCV: 74.8 fL — AB (ref 78.0–100.0)
MCV: 75 fL — ABNORMAL LOW (ref 78.0–100.0)
Platelets: 164 10*3/uL (ref 150–400)
Platelets: 166 10*3/uL (ref 150–400)
RBC: 3.1 MIL/uL — ABNORMAL LOW (ref 4.22–5.81)
RBC: 3.6 MIL/uL — AB (ref 4.22–5.81)
RDW: 20.2 % — AB (ref 11.5–15.5)
RDW: 19.5 % — ABNORMAL HIGH (ref 11.5–15.5)
WBC: 15.4 10*3/uL — ABNORMAL HIGH (ref 4.0–10.5)
WBC: 13.8 10*3/uL — ABNORMAL HIGH (ref 4.0–10.5)

## 2017-01-23 LAB — BLOOD GAS, ARTERIAL
ACID-BASE EXCESS: 7 mmol/L — AB (ref 0.0–2.0)
BICARBONATE: 31.8 mmol/L — AB (ref 20.0–28.0)
Drawn by: 252031
FIO2: 30
LHR: 24 {breaths}/min
O2 SAT: 88.6 %
PEEP/CPAP: 5 cmH2O
PH ART: 7.404 (ref 7.350–7.450)
Patient temperature: 98.6
VT: 500 mL
pCO2 arterial: 51.9 mmHg — ABNORMAL HIGH (ref 32.0–48.0)
pO2, Arterial: 54.3 mmHg — ABNORMAL LOW (ref 83.0–108.0)

## 2017-01-23 LAB — PHOSPHORUS: Phosphorus: 2.2 mg/dL — ABNORMAL LOW (ref 2.5–4.6)

## 2017-01-23 LAB — SAVE SMEAR

## 2017-01-23 LAB — PROCALCITONIN: PROCALCITONIN: 9.01 ng/mL

## 2017-01-23 LAB — MAGNESIUM: Magnesium: 3.2 mg/dL — ABNORMAL HIGH (ref 1.7–2.4)

## 2017-01-23 LAB — HIV ANTIBODY (ROUTINE TESTING W REFLEX): HIV Screen 4th Generation wRfx: NONREACTIVE

## 2017-01-23 MED ORDER — CHLORHEXIDINE GLUCONATE CLOTH 2 % EX PADS
6.0000 | MEDICATED_PAD | Freq: Every day | CUTANEOUS | Status: DC
  Administered 2017-01-23 – 2017-01-25 (×3): 6 via TOPICAL

## 2017-01-23 MED ORDER — VITAL HIGH PROTEIN PO LIQD
1000.0000 mL | ORAL | Status: DC
  Administered 2017-01-23: 1000 mL
  Filled 2017-01-23: qty 1000

## 2017-01-23 MED ORDER — SODIUM CHLORIDE 0.9% FLUSH: 10.0000 mL | INTRAVENOUS | Status: DC | PRN

## 2017-01-23 MED ORDER — POTASSIUM PHOSPHATES 15 MMOLE/5ML IV SOLN
20.0000 mmol | Freq: Once | INTRAVENOUS | Status: AC
  Administered 2017-01-23: 20 mmol via INTRAVENOUS
  Filled 2017-01-23: qty 6.67

## 2017-01-23 MED ORDER — PRO-STAT SUGAR FREE PO LIQD
30.0000 mL | Freq: Two times a day (BID) | ORAL | Status: DC
  Administered 2017-01-23 – 2017-01-24 (×3): 30 mL
  Filled 2017-01-23 (×3): qty 30

## 2017-01-23 MED ORDER — SODIUM CHLORIDE 0.9% FLUSH
10.0000 mL | Freq: Two times a day (BID) | INTRAVENOUS | Status: DC
  Administered 2017-01-24 – 2017-01-25 (×3): 10 mL

## 2017-01-23 NOTE — Consult Note (Signed)
WOC Nurse wound consult note Reason for Consult: Healed full thickness pressure injuries at the coccyx and right IT. Large stable eschar on the right heel (Unstageable Pressure Injury), Full thickness wound on right foot, lateral aspect at 5th digit, right lateral LE healing full thickness wound. IAD the perianal area. Grade 1, erythematous, intact.  Left heel is intact, as is sacrum, bilateral hips and left IT. Wound type: Pressure, moisture Pressure Injury POA: Yes Measurement: Right heel:  Stable, black dry eschar measuring 8cm x 6.5cm. No odor, no drainage. Right lateral LE:  Healing full thickness wound measuring 10cm x 2cm with two open areas. Distal measures 1.5cm x 0.6cm x 0.2cm and presents with a red, moist wound bed.  Proximal measures 2.5cm x 0.8cm x 0.2cm and presents with a red, moist wound bed. Right lateral foot at the 5th digit:  0.6cm round x 0.2cm wound with evidence of previous wound healing at the periphery.  Red, moist wound bed. Coccyx and right IT with healed full thickness pressure injuries.  I am unable to determine if these were a stage 3 or 4, but they have healed via scarring and contraction and the patient remains at risk for pressure injury in these areas due to the diminished tensile strength of the tissue. Wound bed:As described above Drainage (amount, consistency, odor) Scant serous from right lateral LE and right lateral foot. Other wounds are dry. Periwound: Intact, dry Dressing procedure/placement/frequency: I have provided patient with replacement Profore pressure redistribution heel boots as his are both soiled and without the rotational "wedge" to promote foot alignment. Topical wound care to the stable heel eschar is provided-please note that care is aimed at keeping eschar intact and dry rather than softening or removing. The two full thickness wounds at the right LE (lateral LE and lateral foot at 5th digit) will receive conservative twice daily care.  (Additionally, I have provided the Nursing team with guidance for turning and repositioning and for routine skin care and incontinence skin care. WOC nursing team will not follow, but will remain available to this patient, the nursing and medical teams.  Please re-consult if needed. Thanks, Ladona MowLaurie Ajeet Casasola, MSN, RN, GNP, Hans EdenCWOCN, CWON-AP, FAAN  Pager# [phone]

## 2017-01-23 NOTE — Progress Notes (Signed)
PULMONARY / CRITICAL CARE MEDICINE   Name: Craig Oneal. MRN: [id number] DOB: Dec 05, 1950    ADMISSION DATE:  01/22/2017 CONSULTATION DATE:  01/22/17  REFERRING MD:  Carolynn Serve MD  CHIEF COMPLAINT:  Septic shock  HISTORY OF PRESENT ILLNESS:   66 year old with history of cerebellar atrophy, chronic trach, PEG patient transferred from [facility] with 3 days of fever, hypotension. Noted to have positive UA with gram-negative rods at [facility] and he was started on zosyn. In the ED patient noted to be tachycardic, hypotensive, hypothermic altered mental status. Started on [employer]. Labs significant for hemoglobin of 4.6, lactic acid 2.6, creatinine 2.66. 30cc/kg  fluid bolus given and PRBC ordered. PCCM called for admission.  PAST MEDICAL HISTORY :  He  has no past medical history on file.  PAST SURGICAL HISTORY: He  has no past surgical history on file.  No Known Allergies  No current facility-administered medications on file prior to encounter.    No current outpatient prescriptions on file prior to encounter.    FAMILY HISTORY:  His has no family status information on file.    SOCIAL HISTORY: He    REVIEW OF SYSTEMS:   Unable to obtain as patient is on trach, vented  SUBJECTIVE:  Required transfusions yesterday for low crit. No sign of active bleeding Levophed is weaning down. Maintained on the vent  VITAL SIGNS: BP 92/68   Pulse (!) 128   Temp 99.7 F (37.6 C)   Resp (!) 24   Ht 5\' 6"  (1.676 m)   Wt 218 lb 11.1 oz (99.2 kg)   SpO2 94%   BMI 35.30 kg/m   HEMODYNAMICS:   VENTILATOR SETTINGS: Vent Mode: PRVC FiO2 (%):  [30 %-35 %] 30 % Set Rate:  [12 bmp-24 bmp] 24 bmp Vt Set:  [500 mL] 500 mL PEEP:  [5 cmH20] 5 cmH20 Plateau Pressure:  [9 cmH20-25 cmH20] 19 cmH20  INTAKE / OUTPUT: I/O last 3 completed shifts: In: 5002.8 [I.V.:1482.8; Blood:1365; Other:30; IV Piggyback:2125] Out: 1335 [Urine:1335]  PHYSICAL EXAMINATION: Gen:      No acute  distress, obese HEENT:  EOMI, sclera anicteric Neck:     No masses; no thyromegaly Lungs:    Clear to auscultation bilaterally; normal respiratory effort CV:         Regular rate and rhythm; no murmurs Abd:      + bowel sounds; soft, non-tender; no palpable masses, no distension Ext:    No edema; adequate peripheral perfusion Skin:      Warm and dry; no rash Neuro: Awake on vent. Non focal, follows commands  LABS:  BMET  Recent Labs Lab 01/22/17 1626 01/23/17 0745  NA 132* 137  K 3.8 4.0  CL 90* 95*  CO2 32 32  BUN 81* 71*  CREATININE 2.66* 2.09*  GLUCOSE 182* 100*    Electrolytes  Recent Labs Lab 01/22/17 1626 01/22/17 1934 01/23/17 0745  CALCIUM 7.9*  --  8.0*  MG  --  3.3* 3.2*  PHOS  --  2.4* 2.2*    CBC  Recent Labs Lab 01/22/17 1626 01/22/17 2107 01/23/17 0745  WBC 11.9* 15.4* 13.8*  HGB 4.6* 6.6* 8.2*  HCT 16.9* 23.2* 27.0*  PLT 149* 164 166    Coag's  Recent Labs Lab 01/22/17 1626 01/22/17 1934  APTT 40* 41*  INR 1.26 1.32    Sepsis Markers  Recent Labs Lab 01/22/17 1626  01/22/17 1934 01/22/17 2000 01/22/17 2122 01/23/17 0745  LATICACIDVEN  --   < >  3.0* 3.08* 2.2*  --   PROCALCITON 16.95  --  12.21  --   --  9.01  < > = values in this interval not displayed.  ABG  Recent Labs Lab 01/22/17 1832 01/23/17 0345  PHART 7.319* 7.404  PCO2ART 61.8* 51.9*  PO2ART 31.0* 54.3*    Liver Enzymes  Recent Labs Lab 01/22/17 1626  AST 59*  ALT 43  ALKPHOS 255*  BILITOT 1.5*  ALBUMIN 1.6*    Cardiac Enzymes  Recent Labs Lab 01/22/17 1934  TROPONINI 0.11*    Glucose  Recent Labs Lab 01/22/17 2042 01/23/17 0018 01/23/17 0348 01/23/17 0754  GLUCAP 130* 122* 106* 95   STUDIES:  Chest x-ray 5/19> trach in place, dense bilateral infiltrates suspicious for edema versus pneumonia Chest x-ray 5/20 > trach in place. Improving b/l infiltrates. Abdominal x-ray 5/19> no obstruction I have reviewed the images  personally  CULTURES: Bcx ([facility]) 5/16 > Negative Ucx ([facility]) 5/17 > GNR  Blood cultures 5/19 > Urine cultures 5/19 > Sputum cultures 5/19 >  ANTIBIOTICS: Vancomycin 5/19> Zosyn 5/19>   SIGNIFICANT EVENTS: 5/19> admitted from [facility]  LINES/TUBES: Rt PICC ([facility]) 01/20/17 >  DISCUSSION: 66 year old with cerebral atrophy, chronic trach PEG admitted with sepsis from HCAP pneumonia versus UTI. Also noted to have low hemoglobin with no obvious sign of active bleed.  ASSESSMENT / PLAN:  PULMONARY A: Acute on chronic respiratory failure COPD P:   Continue vent Start PSV weans  CARDIOVASCULAR A:  Sepsis Elevated lactic acid Chronic A fib. No sure why he is not an anticoagulation as outpt.  Chronic systolic CHF P:  Tele monitoring Follow troponins, lactic acid Wean levaphed Holding lopressor.  RENAL A:   CKD P:   Follow urine output and creatinine after fluid resuscitation  GASTROINTESTINAL A:   No major issues P:   Start tube feeds Protonix for SUP  HEMATOLOGIC A:   Chronic Anemia. Hemoglobin 4.6.  Looks like anemia of chronic disease.  No sign of active bleeding. + hemoccult P:  Follow DIC, Hemolysis panel Iron supplementation GI consult tomorrow.   INFECTIOUS A:   Septic shock from PNA vs UTI P:   Continue vanco, zosyn Follow cultures  ENDOCRINE A:   Hyperglycemia Hypothyroidism P:   SSI coverage IV synthyroid  NEUROLOGIC A:   Spinal cerebellar ataxia Quadriplegia P:   Monitor neuro status Hold baclofen  FAMILY  - Updates: Son and daughter updated at bedside 5/19 - Inter-disciplinary family meet or Palliative Care meeting due by: 5/26  The patient is critically ill with multiple organ system failure and requires high complexity decision making for assessment and support, frequent evaluation and titration of therapies, advanced monitoring, review of radiographic studies and interpretation of complex data.   Critical Care  Time devoted to patient care services, exclusive of separately billable procedures, described in this note is 45 minutes.   Chilton GreathousePraveen Nekeisha Aure MD [employer] Pulmonary and Critical Care Pager [phone] If no answer or after 3pm call: [phone] 01/23/2017, 10:00 AM

## 2017-01-23 NOTE — Progress Notes (Signed)
Brief Nutrition Note  Consult received for enteral/tube feeding initiation and management.  PEPUP Adult Enteral Nutrition Protocol initiated. Full assessment to follow.  Admitting Dx: Sepsis (HCC) [A41.9]  Body mass index is 35.3 kg/m. Pt meets criteria for obesity based on current BMI.  Labs:   Recent Labs Lab 01/22/17 1626 01/22/17 1934 01/23/17 0745  NA 132*  --  137  K 3.8  --  4.0  CL 90*  --  95*  CO2 32  --  32  BUN 81*  --  71*  CREATININE 2.66*  --  2.09*  CALCIUM 7.9*  --  8.0*  MG  --  3.3* 3.2*  PHOS  --  2.4* 2.2*  GLUCOSE 182*  --  100*    Tilda FrancoLindsey Elynor Kallenberger, MS, RD, LDN Pager: [phone] After Hours Pager: [phone]

## 2017-01-23 NOTE — Progress Notes (Signed)
Tracheal Aspirate collected and sent to lab.

## 2017-01-24 ENCOUNTER — Inpatient Hospital Stay (HOSPITAL_COMMUNITY): Payer: Medicare Other

## 2017-01-24 DIAGNOSIS — A419 Sepsis, unspecified organism: Secondary | ICD-10-CM | POA: Diagnosis not present

## 2017-01-24 DIAGNOSIS — R6521 Severe sepsis with septic shock: Secondary | ICD-10-CM | POA: Diagnosis not present

## 2017-01-24 DIAGNOSIS — T83518A Infection and inflammatory reaction due to other urinary catheter, initial encounter: Secondary | ICD-10-CM | POA: Diagnosis not present

## 2017-01-24 LAB — GLUCOSE, CAPILLARY
GLUCOSE-CAPILLARY: 105 mg/dL — AB (ref 65–99)
GLUCOSE-CAPILLARY: 129 mg/dL — AB (ref 65–99)
Glucose-Capillary: 107 mg/dL — ABNORMAL HIGH (ref 65–99)
Glucose-Capillary: 111 mg/dL — ABNORMAL HIGH (ref 65–99)
Glucose-Capillary: 114 mg/dL — ABNORMAL HIGH (ref 65–99)
Glucose-Capillary: 118 mg/dL — ABNORMAL HIGH (ref 65–99)
Glucose-Capillary: 118 mg/dL — ABNORMAL HIGH (ref 65–99)

## 2017-01-24 LAB — TYPE AND SCREEN
ABO/RH(D): O POS
Antibody Screen: NEGATIVE
UNIT DIVISION: 0
UNIT DIVISION: 0
UNIT DIVISION: 0
Unit division: 0

## 2017-01-24 LAB — BASIC METABOLIC PANEL
ANION GAP: 10 (ref 5–15)
BUN: 70 mg/dL — ABNORMAL HIGH (ref 6–20)
CO2: 31 mmol/L (ref 22–32)
Calcium: 7.8 mg/dL — ABNORMAL LOW (ref 8.9–10.3)
Chloride: 96 mmol/L — ABNORMAL LOW (ref 101–111)
Creatinine, Ser: 1.85 mg/dL — ABNORMAL HIGH (ref 0.61–1.24)
GFR calc Af Amer: 43 mL/min — ABNORMAL LOW (ref >60)
GFR, EST NON AFRICAN AMERICAN: 37 mL/min — AB (ref >60)
GLUCOSE: 108 mg/dL — AB (ref 65–99)
Potassium: 3.8 mmol/L (ref 3.5–5.1)
SODIUM: 137 mmol/L (ref 135–145)

## 2017-01-24 LAB — PHOSPHORUS
PHOSPHORUS: 2.1 mg/dL — AB (ref 2.5–4.6)
Phosphorus: 3 mg/dL (ref 2.5–4.6)
Phosphorus: 2.4 mg/dL — ABNORMAL LOW (ref 2.5–4.6)

## 2017-01-24 LAB — CBC
HCT: 25.9 % — ABNORMAL LOW (ref 39.0–52.0)
Hemoglobin: 7.7 g/dL — ABNORMAL LOW (ref 13.0–17.0)
MCH: 22.3 pg — ABNORMAL LOW (ref 26.0–34.0)
MCHC: 29.7 g/dL — ABNORMAL LOW (ref 30.0–36.0)
MCV: 74.9 fL — AB (ref 78.0–100.0)
PLATELETS: 164 10*3/uL (ref 150–400)
RBC: 3.46 MIL/uL — AB (ref 4.22–5.81)
RDW: 21 % — ABNORMAL HIGH (ref 11.5–15.5)
WBC: 12.8 10*3/uL — AB (ref 4.0–10.5)

## 2017-01-24 LAB — BPAM RBC
BLOOD PRODUCT EXPIRATION DATE: 201805262359
BLOOD PRODUCT EXPIRATION DATE: 201805282359
Blood Product Expiration Date: 201806132359
Blood Product Expiration Date: 201806132359
ISSUE DATE / TIME: 201805200108
ISSUE DATE / TIME: 201805191729
ISSUE DATE / TIME: 201805191729
ISSUE DATE / TIME: 201805200108
UNIT TYPE AND RH: 9500
UNIT TYPE AND RH: 9500
Unit Type and Rh: 5100
Unit Type and Rh: 5100

## 2017-01-24 LAB — MAGNESIUM
MAGNESIUM: 3.1 mg/dL — AB (ref 1.7–2.4)
MAGNESIUM: 3.2 mg/dL — AB (ref 1.7–2.4)
MAGNESIUM: 3 mg/dL — AB (ref 1.7–2.4)

## 2017-01-24 LAB — BLOOD PRODUCT ORDER (VERBAL) VERIFICATION

## 2017-01-24 LAB — PROCALCITONIN: Procalcitonin: 7.2 ng/mL

## 2017-01-24 MED ORDER — POLYETHYLENE GLYCOL 3350 17 G PO PACK
17.0000 g | PACK | Freq: Every day | ORAL | Status: DC
  Administered 2017-01-24: 17 g
  Filled 2017-01-24 (×2): qty 1

## 2017-01-24 MED ORDER — DOCUSATE SODIUM 100 MG PO TABS: 100.0000 mg | ORAL_TABLET | Freq: Two times a day (BID) | ORAL | Status: DC

## 2017-01-24 MED ORDER — CLONAZEPAM 0.5 MG PO TABS
0.2500 mg | ORAL_TABLET | Freq: Every day | ORAL | Status: DC
  Administered 2017-01-24: 0.25 mg
  Filled 2017-01-24: qty 1

## 2017-01-24 MED ORDER — METOPROLOL TARTRATE 12.5 MG HALF TABLET
12.5000 mg | ORAL_TABLET | Freq: Two times a day (BID) | ORAL | Status: DC
  Administered 2017-01-24 – 2017-01-25 (×4): 12.5 mg
  Filled 2017-01-24 (×5): qty 1

## 2017-01-24 MED ORDER — VITAL HIGH PROTEIN PO LIQD
1000.0000 mL | ORAL | Status: DC
  Administered 2017-01-24 – 2017-01-25 (×2): 1000 mL

## 2017-01-24 MED ORDER — IPRATROPIUM-ALBUTEROL 0.5-2.5 (3) MG/3ML IN SOLN: 3.0000 mL | RESPIRATORY_TRACT | Status: DC | PRN

## 2017-01-24 MED ORDER — BUDESONIDE 0.25 MG/2ML IN SUSP
0.2500 mg | Freq: Two times a day (BID) | RESPIRATORY_TRACT | Status: DC
  Administered 2017-01-24 – 2017-01-25 (×3): 0.25 mg via RESPIRATORY_TRACT
  Filled 2017-01-24 (×4): qty 2

## 2017-01-24 MED ORDER — WHITE PETROLATUM GEL
Status: AC
  Administered 2017-01-24: 10:00:00
  Filled 2017-01-24: qty 1

## 2017-01-24 MED ORDER — VITAL HIGH PROTEIN PO LIQD
1000.0000 mL | ORAL | Status: DC
  Administered 2017-01-24: 1000 mL

## 2017-01-24 MED ORDER — PANTOPRAZOLE SODIUM 40 MG PO PACK
40.0000 mg | PACK | ORAL | Status: DC
  Administered 2017-01-24 – 2017-01-25 (×2): 40 mg
  Filled 2017-01-24 (×2): qty 20

## 2017-01-24 MED ORDER — ATORVASTATIN CALCIUM 10 MG PO TABS
10.0000 mg | ORAL_TABLET | Freq: Every day | ORAL | Status: DC
Start: 2017-01-24 — End: 2017-01-25
  Administered 2017-01-24: 10 mg
  Filled 2017-01-24 (×2): qty 1

## 2017-01-24 MED ORDER — BACLOFEN 5 MG HALF TABLET
5.0000 mg | ORAL_TABLET | Freq: Two times a day (BID) | ORAL | Status: DC
  Administered 2017-01-24 – 2017-01-25 (×3): 5 mg
  Filled 2017-01-24 (×4): qty 1

## 2017-01-24 MED ORDER — DOCUSATE SODIUM 50 MG/5ML PO LIQD
100.0000 mg | Freq: Two times a day (BID) | ORAL | Status: DC
  Administered 2017-01-24 (×2): 100 mg
  Filled 2017-01-24 (×4): qty 10

## 2017-01-24 NOTE — Progress Notes (Signed)
PULMONARY / CRITICAL CARE MEDICINE   Name: Craig Oneal. MRN: [id number] DOB: 02-Nov-1950    ADMISSION DATE:  01/22/2017 CONSULTATION DATE:  01/22/17  REFERRING MD:  Carolynn Serve MD  CHIEF COMPLAINT:  Septic shock  HISTORY OF PRESENT ILLNESS:   66 year old with history of cerebellar atrophy, chronic trach, PEG patient transferred from [facility] with 3 days of fever, hypotension. Noted to have positive UA with gram-negative rods at [facility] and he was started on zosyn. In the ED patient noted to be tachycardic, hypotensive, hypothermic altered mental status. Started on [employer]. Labs significant for hemoglobin of 4.6, lactic acid 2.6, creatinine 2.66. 30cc/kg  fluid bolus given and PRBC ordered. PCCM called for admission.   SUBJECTIVE:  Remains on full vent support.  Off pressors 5/20.  VITAL SIGNS: BP (!) 100/59   Pulse (!) 105   Temp 99.5 F (37.5 C)   Resp (!) 24   Ht 5\' 6"  (1.676 m)   Wt 220 lb 14.4 oz (100.2 kg)   SpO2 92%   BMI 35.65 kg/m   HEMODYNAMICS:   VENTILATOR SETTINGS: Vent Mode: PRVC FiO2 (%):  [30 %] 30 % Set Rate:  [24 bmp] 24 bmp Vt Set:  [500 mL] 500 mL PEEP:  [5 cmH20] 5 cmH20 Plateau Pressure:  [23 cmH20-28 cmH20] 24 cmH20  INTAKE / OUTPUT: I/O last 3 completed shifts: In: 7028.5 [I.V.:2589.9; Blood:1365; Other:30; NG/GT:168.7; IV Piggyback:2875] Out: 1985 [Urine:1985]  PHYSICAL EXAMINATION:  General - alert Eyes - pupils reactive ENT - trach site clean Cardiac - irregular, no murmur Chest - b/l crackles Abd - soft, non tender Ext - 1+ edema Skin - no rashes Neuro - normal strength Psych - normal mood   LABS:  BMET  Recent Labs Lab 01/22/17 1626 01/23/17 0745 01/24/17 0312  NA 132* 137 137  K 3.8 4.0 3.8  CL 90* 95* 96*  CO2 32 32 31  BUN 81* 71* 70*  CREATININE 2.66* 2.09* 1.85*  GLUCOSE 182* 100* 108*    Electrolytes  Recent Labs Lab 01/22/17 1626  01/23/17 0745 01/23/17 1730 01/24/17 0312  CALCIUM  7.9*  --  8.0*  --  7.8*  MG  --   < > 3.2* 3.1* 3.2*  PHOS  --   < > 2.2* 3.0 2.4*  < > = values in this interval not displayed.  CBC  Recent Labs Lab 01/22/17 2107 01/23/17 0745 01/24/17 0312  WBC 15.4* 13.8* 12.8*  HGB 6.6* 8.2* 7.7*  HCT 23.2* 27.0* 25.9*  PLT 164 166 164    Coag's  Recent Labs Lab 01/22/17 1626 01/22/17 1934  APTT 40* 41*  INR 1.26 1.32    Sepsis Markers  Recent Labs Lab 01/22/17 1934 01/22/17 2000 01/22/17 2122 01/23/17 0745 01/24/17 0312  LATICACIDVEN 3.0* 3.08* 2.2*  --   --   PROCALCITON 12.21  --   --  9.01 7.20    ABG  Recent Labs Lab 01/22/17 1832 01/23/17 0345  PHART 7.319* 7.404  PCO2ART 61.8* 51.9*  PO2ART 31.0* 54.3*    Liver Enzymes  Recent Labs Lab 01/22/17 1626  AST 59*  ALT 43  ALKPHOS 255*  BILITOT 1.5*  ALBUMIN 1.6*    Cardiac Enzymes  Recent Labs Lab 01/22/17 1934  TROPONINI 0.11*    Glucose  Recent Labs Lab 01/23/17 0754 01/23/17 1305 01/23/17 1703 01/23/17 1926 01/24/17 0038 01/24/17 0321  GLUCAP 95 93 122* 106* 107* 105*    Dg Abdomen 1 View  Result  Date: 01/22/2017 CLINICAL DATA:  Fever for 2 days.  UTI.  Sepsis. EXAM: ABDOMEN - 1 VIEW COMPARISON:  None. FINDINGS: Normal bowel gas pattern. Gastrostomy tube projects in the central abdomen over the superior aspect of the gastric air shadow. Soft tissues are unremarkable.  No acute skeletal abnormality. IMPRESSION: No acute findings.  No bowel obstruction. Electronically Signed   By: Amie Portlandavid  Ormond M.D.   On: 01/22/2017 18:10   Dg Chest Port 1 View  Result Date: 01/23/2017 CLINICAL DATA:  Tracheostomy.  Respiratory failure EXAM: PORTABLE CHEST 1 VIEW COMPARISON:  01/22/2017 FINDINGS: Cardiomegaly with vascular congestion and mild bilateral airspace opacities, likely edema. This is improved since prior study. Small effusions. No acute bony abnormality. IMPRESSION: Improving pulmonary edema/ CHF pattern.  Suspect small effusions.  Electronically Signed   By: Charlett NoseKevin  Dover M.D.   On: 01/23/2017 07:28   Dg Chest Port 1 View  Result Date: 01/22/2017 CLINICAL DATA:  Fever EXAM: PORTABLE CHEST 1 VIEW COMPARISON:  None. FINDINGS: Cardiac shadow is enlarged. Right-sided PICC line is noted in the mid right atrium. Tracheostomy tube is noted in satisfactory position. Diffuse bilateral perihilar density which may be related to pulmonary edema or diffuse bilateral infiltrates. No focal confluent infiltrate is seen. No bony abnormality is noted. IMPRESSION: Diffuse perihilar densities consistent with infiltrate/edema. Follow-up imaging is recommended. Electronically Signed   By: Alcide CleverMark  Lukens M.D.   On: 01/22/2017 18:09    STUDIES:    CULTURES: Bcx ([facility]) 5/16 > Negative Ucx ([facility]) 5/17 > GNR  Blood cultures 5/19 > Urine cultures 5/19 > Sputum cultures 5/19 >  ANTIBIOTICS: Vancomycin 5/19> Zosyn 5/19>   SIGNIFICANT EVENTS: 5/19 admitted from [facility] 5/20 off pressors  LINES/TUBES: Rt PICC ([facility]) 01/20/17 >  DISCUSSION: 66 year old with cerebral atrophy, chronic trach PEG admitted with sepsis from HCAP pneumonia versus UTI.  Also noted to have low hemoglobin with no obvious sign of active bleed.  ASSESSMENT / PLAN:  PULMONARY A: Acute on chronic respiratory failure COPD P:   Pressure support wean as tolerated Prn BDs F/u CXR  CARDIOVASCULAR A:  Septic shock with elevated lactic acid >> off pressors 5/20. Chronic A fib. Not sure why he is not an anticoagulation as outpt.  Chronic systolic CHF. P:  Resumed lopressor 5/21  RENAL A:   CKD 3. P:   Monitor renal fx, urine outpt  GASTROINTESTINAL A:   Nutrition. P:   Tube feeds  HEMATOLOGIC A:   Anemia of critical illness and chronic disease. Hb 4.6 on admission >> no signs of bleeding. P:  Monitor Hb Transfuse for Hb < 7 Might need GI evaluation Continue feosol  INFECTIOUS A:   Septic shock 2nd to UTI. P:   Continue zosyn D/c  vancomycin  ENDOCRINE A:   Hyperglycemia Hypothyroidism P:   SSI Synthroid  NEUROLOGIC A:   Spinal cerebellar ataxia Quadriplegia P:   Resume baclofen 5/21  CC time 32 minutes  Coralyn HellingVineet Walt Geathers, MD [facility] 01/24/2017, 6:43 AM Pager:  [phone] After 3pm call: [phone]

## 2017-01-24 NOTE — Progress Notes (Signed)
Initial Nutrition Assessment  DOCUMENTATION CODES:   Obesity unspecified  INTERVENTION:    Vital High Protein at 60 ml/h (1440 ml per day)   Provides 1440 kcal, 126 gm protein, 1204 ml free water daily  NUTRITION DIAGNOSIS:   Inadequate oral intake related to inability to eat as evidenced by NPO status.  GOAL:   Provide needs based on ASPEN/SCCM guidelines  MONITOR:   Vent status, TF tolerance, Skin, Labs, I & O's  REASON FOR ASSESSMENT:   Consult Enteral/tube feeding initiation and management  ASSESSMENT:   66 year old with cerebral atrophy, chronic trach PEG-J admitted with sepsis from HCAP pneumonia versus UTI.  Also noted to have low hemoglobin with no obvious sign of active bleed.  Discussed patient in ICU rounds and with RN today. Received MD Consult for TF initiation and management. Currently receiving Vital High Protein at 40 ml/h via G-J tube with Pro-stat 30 ml BID per Adult TF Protocol. Nutrition-Focused physical exam completed. Findings are no fat depletion, no muscle depletion, and mild-moderate edema.  Patient is currently intubated on ventilator support MV: 13.3 L/min Temp (24hrs), Avg:100.1 F (37.8 C), Min:98.8 F (37.1 C), Max:100.8 F (38.2 C)  Labs reviewed: phosphorus 2.4 (L), magnesium 3.2 (H) CBG's: [phone] Medications reviewed and include Colace, iron.  Diet Order:  Diet NPO time specified  Skin:  Wound (see comment) (unstageable R heel, stg II R foot, stg III R leg)  Last BM:  5/21  Height:   Ht Readings from Last 1 Encounters:  01/22/17 5\' 6"  (1.676 m)    Weight:   Wt Readings from Last 1 Encounters:  01/24/17 220 lb 14.4 oz (100.2 kg)  Admission weight 191 lbs (86.6 kg); BMI = 30.8  Ideal Body Weight:  64.5 kg  BMI:  Body mass index is 35.65 kg/m.  Estimated Nutritional Needs:   Kcal:  1200-1400  Protein:  129 gm  Fluid:  1.8 L  EDUCATION NEEDS:   No education needs identified at this time  Joaquin CourtsKimberly  Harris, RD, LDN, CNSC Pager [phone] After Hours Pager [phone]

## 2017-01-24 NOTE — Progress Notes (Signed)
eLink Physician-Brief Progress Note Patient Name: Craig DiegoCharlie J Zukowski Jr. DOB: [birth year] MRN: [id number]   Date of Service  01/24/2017  HPI/Events of Note  Remains in A fib, HR now to 120's. Off norepi gtt. Will start low dose enteral metoprolol, follow HR and BP  eICU Interventions       Intervention Category Intermediate Interventions: Arrhythmia - evaluation and management  Craig Medellin S. 01/24/2017, 2:26 AM

## 2017-01-25 ENCOUNTER — Inpatient Hospital Stay (HOSPITAL_COMMUNITY): Payer: Medicare Other

## 2017-01-25 DIAGNOSIS — J9620 Acute and chronic respiratory failure, unspecified whether with hypoxia or hypercapnia: Secondary | ICD-10-CM | POA: Diagnosis not present

## 2017-01-25 DIAGNOSIS — T83518A Infection and inflammatory reaction due to other urinary catheter, initial encounter: Secondary | ICD-10-CM | POA: Diagnosis not present

## 2017-01-25 DIAGNOSIS — R6521 Severe sepsis with septic shock: Secondary | ICD-10-CM | POA: Diagnosis not present

## 2017-01-25 DIAGNOSIS — A419 Sepsis, unspecified organism: Secondary | ICD-10-CM | POA: Diagnosis not present

## 2017-01-25 LAB — BASIC METABOLIC PANEL
Anion gap: 9 (ref 5–15)
BUN: 66 mg/dL — ABNORMAL HIGH (ref 6–20)
CHLORIDE: 98 mmol/L — AB (ref 101–111)
CO2: 31 mmol/L (ref 22–32)
Calcium: 7.8 mg/dL — ABNORMAL LOW (ref 8.9–10.3)
Creatinine, Ser: 1.53 mg/dL — ABNORMAL HIGH (ref 0.61–1.24)
GFR calc non Af Amer: 47 mL/min — ABNORMAL LOW (ref >60)
GFR, EST AFRICAN AMERICAN: 54 mL/min — AB (ref >60)
Glucose, Bld: 122 mg/dL — ABNORMAL HIGH (ref 65–99)
POTASSIUM: 3.3 mmol/L — AB (ref 3.5–5.1)
SODIUM: 138 mmol/L (ref 135–145)

## 2017-01-25 LAB — CBC
HEMATOCRIT: 26 % — AB (ref 39.0–52.0)
Hemoglobin: 7.8 g/dL — ABNORMAL LOW (ref 13.0–17.0)
MCH: 22.7 pg — ABNORMAL LOW (ref 26.0–34.0)
MCHC: 30 g/dL (ref 30.0–36.0)
MCV: 75.8 fL — AB (ref 78.0–100.0)
Platelets: 197 10*3/uL (ref 150–400)
RBC: 3.43 MIL/uL — AB (ref 4.22–5.81)
RDW: 23 % — ABNORMAL HIGH (ref 11.5–15.5)
WBC: 13.2 10*3/uL — AB (ref 4.0–10.5)

## 2017-01-25 LAB — CULTURE, RESPIRATORY

## 2017-01-25 LAB — GLUCOSE, CAPILLARY
GLUCOSE-CAPILLARY: 131 mg/dL — AB (ref 65–99)
GLUCOSE-CAPILLARY: 121 mg/dL — AB (ref 65–99)
Glucose-Capillary: 113 mg/dL — ABNORMAL HIGH (ref 65–99)

## 2017-01-25 LAB — CULTURE, RESPIRATORY W GRAM STAIN

## 2017-01-25 MED ORDER — VITAL HIGH PROTEIN PO LIQD
1000.0000 mL | ORAL | Status: DC
Start: 2017-01-26 — End: 2017-08-20

## 2017-01-25 MED ORDER — METOPROLOL TARTRATE 25 MG PO TABS
12.5000 mg | ORAL_TABLET | Freq: Two times a day (BID) | ORAL | Status: DC
Start: 2017-01-25 — End: 2017-10-14

## 2017-01-25 MED ORDER — DOCUSATE SODIUM 50 MG/5ML PO LIQD: 100.0000 mg | Freq: Two times a day (BID) | ORAL | 0 refills | Status: DC

## 2017-01-25 MED ORDER — BACLOFEN 10 MG PO TABS: 5.0000 mg | ORAL_TABLET | Freq: Two times a day (BID) | ORAL | Status: DC

## 2017-01-25 MED ORDER — DEXTROSE 5 % IV SOLN
2.0000 g | Freq: Three times a day (TID) | INTRAVENOUS | Status: DC
  Administered 2017-01-25: 2 g via INTRAVENOUS
  Filled 2017-01-25 (×2): qty 2

## 2017-01-25 MED ORDER — FERROUS SULFATE 300 (60 FE) MG/5ML PO SYRP: 300.0000 mg | ORAL_SOLUTION | Freq: Every day | ORAL | 3 refills | Status: DC

## 2017-01-25 MED ORDER — DEXTROSE 5 % IV SOLN: 2.0000 g | Freq: Three times a day (TID) | INTRAVENOUS | Status: DC

## 2017-01-25 MED ORDER — POTASSIUM CHLORIDE 20 MEQ/15ML (10%) PO SOLN
40.0000 meq | Freq: Once | ORAL | Status: AC
  Administered 2017-01-25: 40 meq via ORAL
  Filled 2017-01-25: qty 30

## 2017-01-25 MED ORDER — FUROSEMIDE 10 MG/ML IJ SOLN
20.0000 mg | Freq: Once | INTRAMUSCULAR | Status: AC
  Administered 2017-01-25: 20 mg via INTRAVENOUS
  Filled 2017-01-25: qty 2

## 2017-01-25 MED ORDER — IPRATROPIUM-ALBUTEROL 0.5-2.5 (3) MG/3ML IN SOLN: 3.0000 mL | RESPIRATORY_TRACT | Status: DC | PRN

## 2017-01-25 NOTE — Progress Notes (Signed)
[facility]resa[facility] Patient to transfer via [facility]. All questions answered. Family at bedside.

## 2017-01-25 NOTE — NC FL2 (Signed)
[place] LEVEL OF CARE SCREENING TOOL     IDENTIFICATION  Patient Name: Craig Oneal. Birthdate: 07-15-1951 Sex: male Admission Date (Current Location): 01/22/2017  [facility] and [place] Number:  Herbalist and Address:  [facility] [place], [place] [address], [place], [place] [address]      Provider Number: [id number]  Attending Physician Name and Address:  Marshell Garfinkel, MD  Relative Name and Phone Number:       Current Level of Care: Hospital Recommended Level of Care: [place] Prior Approval Number:    Date Approved/Denied:   PASRR Number:  [id number]   Discharge Plan: SNF    Current Diagnoses: Patient Active Problem List   Diagnosis Date Noted  . Pressure injury of skin 01/23/2017  . Septic shock ([place]) 01/22/2017    Orientation RESPIRATION BLADDER Height & Weight      (Intubated/ unable to assess)  Vent (FiO2: 30%) Continent Weight: 221 lb 5.5 oz (100.4 kg) Height:  5' 6" (167.6 cm)  BEHAVIORAL SYMPTOMS/MOOD NEUROLOGICAL BOWEL NUTRITION STATUS      Incontinent Diet (NPO)  AMBULATORY STATUS COMMUNICATION OF NEEDS Skin     Does not communicate (Intubated) PU Stage and Appropriate Care   PU Stage 2 Dressing:  (Right foot, moist to dry dressing, change twice a day) PU Stage 3 Dressing:  (Right leg, moist to dry dressing, twice a day) PU Stage 4 Dressing:  (Right heel, unstagable, (Painted with Providone-iodine))               Personal Care Assistance Level of Assistance  Total care       Total Care Assistance: Maximum assistance   Functional Limitations Info  Sight, Hearing, Speech Sight Info: Adequate Hearing Info: Adequate Speech Info:  (Intubated)    SPECIAL CARE FACTORS FREQUENCY                       Contractures Contractures Info: Not present    Additional Factors Info  Code Status, Allergies Code Status Info: Full [place] Allergies Info: No known allergies            Current Medications (01/25/2017):  This is the current hospital active medication list Current Facility-Administered Medications  Medication Dose Route Frequency Provider Last Rate Last Dose  . 0.9 %  sodium chloride infusion  250 mL Intravenous PRN Erick Colace, NP 20 mL/hr at 01/24/17 0400 250 mL at 01/24/17 0400  . atorvastatin (LIPITOR) tablet 10 mg  10 mg Per Tube [id number] Chesley Mires, MD   10 mg at 01/24/17 1712  . baclofen (LIORESAL) tablet 5 mg  5 mg Per Tube BID Chesley Mires, MD   5 mg at 01/25/17 0914  . budesonide (PULMICORT) nebulizer solution 0.25 mg  0.25 mg Nebulization BID Chesley Mires, MD   0.25 mg at 01/25/17 0736  . cefTAZidime (FORTAZ) 2 g in dextrose 5 % 50 mL IVPB  2 g Intravenous Q8H Priscella Mann, RPH   Stopped at 01/25/17 1204  . chlorhexidine gluconate (MEDLINE KIT) (PERIDEX) 0.12 % solution 15 mL  15 mL Mouth Rinse BID Mannam, Praveen, MD   15 mL at 01/25/17 0800  . Chlorhexidine Gluconate Cloth 2 % PADS 6 each  6 each Topical Daily Mannam, Praveen, MD   6 each at 01/25/17 1000  . clonazePAM (KLONOPIN) tablet 0.25 mg  0.25 mg Per Tube QHS Chesley Mires, MD   0.25 mg at 01/24/17 2200  .  docusate (COLACE) 50 MG/5ML liquid 100 mg  100 mg Per Tube BID Marshell Garfinkel, MD   Stopped at 01/25/17 1000  . feeding supplement (VITAL HIGH PROTEIN) liquid 1,000 mL  1,000 mL Per Tube Q24H Chesley Mires, MD   1,000 mL at 01/25/17 0800  . ferrous sulfate 300 (60 Fe) MG/5ML syrup 300 mg  300 mg Per Tube Q breakfast Erick Colace, NP   300 mg at 01/25/17 4782  . insulin aspart (novoLOG) injection 0-15 Units  0-15 Units Subcutaneous Q4H Erick Colace, NP   2 Units at 01/25/17 [phone]  . ipratropium-albuterol (DUONEB) 0.5-2.5 (3) MG/3ML nebulizer solution 3 mL  3 mL Nebulization Q2H PRN Chesley Mires, MD      . lactated ringers infusion   Intravenous Continuous Chesley Mires, MD 50 mL/hr at 01/24/17 1403    . levothyroxine (SYNTHROID, LEVOTHROID) tablet 75 mcg  75 mcg Per Tube QAC  breakfast Erick Colace, NP   75 mcg at 01/25/17 1308  . MEDLINE mouth rinse  15 mL Mouth Rinse QID Mannam, Praveen, MD   15 mL at 01/25/17 1200  . metoprolol tartrate (LOPRESSOR) tablet 12.5 mg  12.5 mg Per Tube BID Collene Gobble, MD   12.5 mg at 01/25/17 0913  . pantoprazole sodium (PROTONIX) 40 mg/20 mL oral suspension 40 mg  40 mg Per Tube Q24H Chesley Mires, MD   40 mg at 01/25/17 0554  . polyethylene glycol (MIRALAX / GLYCOLAX) packet 17 g  17 g Per Tube Daily Chesley Mires, MD   Stopped at 01/25/17 1000  . sodium chloride flush (NS) 0.9 % injection 10-40 mL  10-40 mL Intracatheter Q12H Mannam, Praveen, MD   10 mL at 01/25/17 0914  . sodium chloride flush (NS) 0.9 % injection 10-40 mL  10-40 mL Intracatheter PRN Mannam, [place], MD         Discharge Medications: Please see discharge summary for a list of discharge medications.  Relevant Imaging Results:  Relevant Lab Results:   Additional Information SSN: [id number]  Eileen Stanford, LCSW

## 2017-01-25 NOTE — Discharge Summary (Signed)
Name: Craig Oneal. Date of Birth: 11/09/1950 MRN: [id number]  Admission date: 01/22/2017 Discharge date: 01/25/2017  Admitting diagnoses: Septic shock Hemorrhagic shock Anemia of critical illness and chronic disease Urinary tract infection HCAP Lactic acidosis Acute on chronic hypoxic respiratory failure Failure to wean from ventilator s/p tracheostomy Dysphagia s/p G tube Cerebellar atrophy with ataxia CKD 3 Hypothyroidism Pressure ulcers coccyx, Rt IT, Rt heel present prior to admission COPD Chronic atrial fibrillation Chronic systolic CHF Hyperglycemia  Discharge diagnoses: Septic shock Proteus bacteremia from blood culture at [facility] 01/19/17 Hemorrhagic shock Anemia of critical illness and chronic disease Urinary tract infection with Providencia and Proteus in urine culture from at [facility] [place] 01/20/17 HCAP with Pseudomonas and Serratia Lactic acidosis Acute on chronic hypoxic respiratory failure Failure to wean from ventilator s/p tracheostomy Dysphagia s/p G tube Cerebellar atrophy with ataxia CKD 3 Hypothyroidism Pressure ulcers coccyx, Rt IT, Rt heel present prior to admission COPD Chronic atrial fibrillation Chronic systolic CHF Hyperglycemia Hypokalemia  Chief Complaint:  Altered mental status  History of Present Illness: 66 yo old male with cerebellar atrophy, functional paraplegia with ataxia, chronic respiratory failure s/p tracheostomy and resident at [facility] was transferred to [facility] with 3 days of fever and hypotension.  He was noted to have abnormal U/A and started on zosyn.  In ER he was noted to have tachycardia, hypotension, hypothermia, and lactic acidosis.  He was also noted to have profound anemia with Hb 4.6.  He was continued on zosyn and started on vancomycin.  He received 4 units PRBC with improvement in Hb.  There was no signs of bleeding.  He required pressor agents, but was weaned off these on 01/23/17.  His mental status  improved.  Culture results from [facility] and [facility] became available.  He was transitioned to [facility] for antibiotics.    Vital signs: BP 96/75 (BP Location: Left Arm)   Pulse (!) 112   Temp 98.8 F (37.1 C) (Core (Comment))   Resp (!) 21   Ht _0  (1.676 m)   Wt 221 lb 5.5 oz (100.4 kg)   SpO2 95%   BMI 35.73 kg/m   Physical exam: General - alert Eyes - pupils reactive ENT - trach site clean Cardiac - irregular, no murmur Chest - faint b/l crackles  Abdomen - soft, non tender Ext - 1+ edema b/l Skin - pressure wounds present prior to admission Psych calm  Labs: CMP Latest Ref Rng & Units 01/25/2017 01/24/2017 01/23/2017  Glucose 65 - 99 mg/dL 122(H) 108(H) 100(H)  BUN 6 - 20 mg/dL 66(H) 70(H) 71(H)  Creatinine 0.61 - 1.24 mg/dL 1.53(H) 1.85(H) 2.09(H)  Sodium 135 - 145 mmol/L 138 137 137  Potassium 3.5 - 5.1 mmol/L 3.3(L) 3.8 4.0  Chloride 101 - 111 mmol/L 98(L) 96(L) 95(L)  CO2 22 - 32 mmol/L 31 31 32  Calcium 8.9 - 10.3 mg/dL 7.8(L) 7.8(L) 8.0(L)  Total Protein 6.5 - 8.1 g/dL - - -  Total Bilirubin 0.3 - 1.2 mg/dL - - -  Alkaline Phos 38 - 126 U/L - - -  AST 15 - 41 U/L - - -  ALT 17 - 63 U/L - - -    CBC Latest Ref Rng & Units 01/25/2017 01/24/2017 01/23/2017  WBC 4.0 - 10.5 K/uL 13.2(H) 12.8(H) 13.8(H)  Hemoglobin 13.0 - 17.0 g/dL 7.8(L) 7.7(L) 8.2(L)  Hematocrit 39.0 - 52.0 % 26.0(L) 25.9(L) 27.0(L)  Platelets 150 - 400 K/uL 197 164 166   Imaging studies: Dg  Abdomen 1 View  Result Date: 01/22/2017 CLINICAL DATA:  Fever for 2 days.  UTI.  Sepsis. EXAM: ABDOMEN - 1 VIEW COMPARISON:  None. FINDINGS: Normal bowel gas pattern. Gastrostomy tube projects in the central abdomen over the superior aspect of the gastric air shadow. Soft tissues are unremarkable.  No acute skeletal abnormality. IMPRESSION: No acute findings.  No bowel obstruction. Electronically Signed   By: Lajean Manes M.D.   On: 01/22/2017 18:10   Dg Chest Port 1 View  Result Date:  01/25/2017 CLINICAL DATA:  Respiratory failure EXAM: PORTABLE CHEST 1 VIEW COMPARISON:  Yesterday FINDINGS: Tracheostomy in place. The balloon appears to distend the trachea. Right upper extremity PICC with tip at the right atrium. Diffuse airspace opacity. Cardiomegaly. No visible pneumothorax. IMPRESSION: 1. Widespread edema and/or pneumonia. Opacity appears increased from yesterday. 2. Tracheostomy with cuff distending the trachea. Please correlate with cuff pressure. Electronically Signed   By: Monte Fantasia M.D.   On: 01/25/2017 07:15   Dg Chest Port 1 View  Result Date: 01/24/2017 CLINICAL DATA:  66 year old male with history of acute respiratory failure. Shortness of breath. EXAM: PORTABLE CHEST 1 VIEW COMPARISON:  Chest x-ray 01/23/2017. FINDINGS: A tracheostomy tube is in place with tip 5.9 cm above the carina. There is a right upper extremity PICC with tip terminating in the right atrium approximately 6.8 cm distal to the superior cavoatrial junction. There is cephalization of the pulmonary vasculature and slight indistinctness of the interstitial markings suggestive of mild pulmonary edema. Small left pleural effusion. Heart size is moderately enlarged. The patient is rotated to the right on today's exam, resulting in distortion of the mediastinal contours and reduced diagnostic sensitivity and specificity for mediastinal pathology. IMPRESSION: 1. Support apparatus, as above. 2. The appearance the chest remains most compatible with congestive heart failure, as detailed above. Electronically Signed   By: Vinnie Langton M.D.   On: 01/24/2017 07:07   Dg Chest Port 1 View  Result Date: 01/23/2017 CLINICAL DATA:  Tracheostomy.  Respiratory failure EXAM: PORTABLE CHEST 1 VIEW COMPARISON:  01/22/2017 FINDINGS: Cardiomegaly with vascular congestion and mild bilateral airspace opacities, likely edema. This is improved since prior study. Small effusions. No acute bony abnormality. IMPRESSION: Improving  pulmonary edema/ CHF pattern.  Suspect small effusions. Electronically Signed   By: Rolm Baptise M.D.   On: 01/23/2017 07:28   Dg Chest Port 1 View  Result Date: 01/22/2017 CLINICAL DATA:  Fever EXAM: PORTABLE CHEST 1 VIEW COMPARISON:  None. FINDINGS: Cardiac shadow is enlarged. Right-sided PICC line is noted in the mid right atrium. Tracheostomy tube is noted in satisfactory position. Diffuse bilateral perihilar density which may be related to pulmonary edema or diffuse bilateral infiltrates. No focal confluent infiltrate is seen. No bony abnormality is noted. IMPRESSION: Diffuse perihilar densities consistent with infiltrate/edema. Follow-up imaging is recommended. Electronically Signed   By: Inez Catalina M.D.   On: 01/22/2017 18:09    ABG    Component Value Date/Time   PHART 7.404 01/23/2017 0345   PCO2ART 51.9 (H) 01/23/2017 0345   PO2ART 54.3 (L) 01/23/2017 0345   HCO3 31.8 (H) 01/23/2017 0345   TCO2 35 01/22/2017 1832   O2SAT 88.6 01/23/2017 0345    CBG (last 3)   Recent Labs  01/25/17 0355 01/25/17 0754 01/25/17 1207  GLUCAP 113* 131* 121*    Allergies: No Known Allergies  Admission medications: Lipitor 40 mg daily Baclofen 5 mg bid Pulmicort 0.25 mg bid Klonopin 0..25 mg qhs Aranesp 100 mcg  weekly Colace 100 mg bid Ferrous sulfate 300 mg bid Flonase two sprays daily Lasix 20 mg daily Duoneb q6hr Synthroid 75 mcg daily Claritin 10 mg daily Lopressors 25 mg bid Singulair 10 mg daily Protonix 40 mg daily Miralax 17 gm daily Zosyn  Discharge medications Lipitor 10 mg daily Baclofen 5 mg bid Pulmicort 0.25 mg bid Fortaz 2 gm q8h Klonopin 0.25 mg qhs Colace 100 mg bid Feosol 300 mg daily Synthroid 75 mcg daily Lopressor 12.5 mg bid Protonix 40 mg daily Miralaz 17 gm daily Duoneb q2h prn  Activity: Bed rest Nutrition: Vital high protein @ 60 ml/hr  Lines: Tracheostomy Rt PICC line placed 01/20/17 at [facility] Gastrostomy tube  Vent settings: PRVC  rate 24, Vt 500, FiO2 30%, PEEP 5  Condition on discharge: stable Disposition: Transfer to [facility] hospital Benefits of transfer: Ventilator weaning, physical therapy, wound care, completion of antibiotic therapy  He has met maximal benefit from hospitalization at [facility].  Time spent with discharge 42 minutes  Chesley Mires, MD [place] 01/25/2017, 2:21 PM Pager:  [phone] After 3pm call: [phone]

## 2017-01-25 NOTE — Progress Notes (Signed)
1438 Patient left with [facility] for [facility]. Pt VSS. No c/o pain. All questions answered.

## 2017-01-25 NOTE — Clinical Social Work Note (Addendum)
Pt ventilated. Plan is for pt to return to [facility] today. CSW spoke with [facility] they are prepared to take pt today. RN notified. CSW will set up [facility] once Summary provided. Patient will go to room 304. Admitting MD is Dr. Amada JupiterKirkpatrick. RN to print completed EMTALA form once report is called [phone].  IatanBridget Dockery, [place] [phone]

## 2017-01-25 NOTE — Progress Notes (Signed)
Pharmacy Antibiotic Note  Craig DiegoCharlie J Shuler Jr. is a 66 y.o. male admitted on 01/22/2017 with pneumonia, bacteremia and UTI.  Pharmacy has been consulted for Ceftazidime dosing. Patient has Proteus and Provendencia UTI and Proteus bacteremia on cultures from [facility]. Here patient is positive for Pseudomonas and Serratia pneumonia.   See culture results below (also copies of [facility] cultures in paper chart).  Plan: After discussion with Dr. Earlie CountsSood/Craig Groce, NP, antibiotics will be changed Ceftazidime 2g IV every 8 hours. .  Discontinue Zosyn.   Height: 5\' 6"  (167.6 cm) Weight: 221 lb 5.5 oz (100.4 kg) IBW/kg (Calculated) : 63.8  Temp (24hrs), Avg:99.2 F (37.3 C), Min:98.7 F (37.1 C), Max:99.7 F (37.6 C)   Recent Labs Lab 01/22/17 1626 01/22/17 1642 01/22/17 1934 01/22/17 2000 01/22/17 2107 01/22/17 2122 01/23/17 0745 01/24/17 0312 01/25/17 0405  WBC 11.9*  --   --   --  15.4*  --  13.8* 12.8* 13.2*  CREATININE 2.66*  --   --   --   --   --  2.09* 1.85* 1.53*  LATICACIDVEN  --  2.60* 3.0* 3.08*  --  2.2*  --   --   --     Estimated Creatinine Clearance: 54.8 mL/min (A) (by C-G formula based on SCr of 1.53 mg/dL (H)).    No Known Allergies  Antimicrobials this admission: Vanc 5/19>>5/21 Zosyn 5/19>> 5/22 Elita QuickFortaz 5/22 >>  Dose adjustments this admission:   Microbiology results: /16 ([facility]) BCx: 1/2 Staph capitis (Oxacillin R; S-Doxy, Rifampin, Vanc, Bactrim); 1/2 Proteus (R-FQ, S-CTX, Cefepime, Zosyn) 5/17 ([facility]) UCx: >100 K Providencia stuarti  (R-FQ, S-CTX, Cefepime) & Proteus vulgaris (R-FQ, S-CTX,  Zosyn, Cefepime, Bactrim) 5/19 BCx2: pending 5/19 TA: Pseudomonas (I-Gent,S- CTX, Cefepime [MIC 8], Fortaz MIC 4, S-Cipro), Serratia (R-Ancef; S-CTX, Cefepime [MIC 8], Cipro) 5/19 MRSA PCR: neg  Thank you for allowing pharmacy to be a part of this patient's care.  Link SnufferJessica Kateryn Marasigan, PharmD, BCPS Clinical Pharmacist Clinical phone 01/25/2017 until 3:30PM [phone]-  #[phone] After hours, please call #[phone] 01/25/2017 11:10 AM

## 2017-01-25 NOTE — Care Management (Signed)
CSW verified that pt is from [facility] - pt will discharge back to [facility] today

## 2017-01-25 NOTE — Progress Notes (Signed)
PULMONARY / CRITICAL CARE MEDICINE   Name: Craig Oneal. MRN: [id number] DOB: 1951/04/18    ADMISSION DATE:  01/22/2017 CONSULTATION DATE:  01/22/17  REFERRING Oneal:  Craig Oneal  CHIEF COMPLAINT:  Septic shock  HISTORY OF PRESENT ILLNESS:   66 year old with history of cerebellar atrophy, chronic trach, PEG patient transferred from [facility] with 3 days of fever, hypotension. Noted to have positive UA with gram-negative rods at [facility] and he was started on zosyn. In the ED patient noted to be tachycardic, hypotensive, hypothermic altered mental status. Started on [employer]. Labs significant for hemoglobin of 4.6, lactic acid 2.6, creatinine 2.66. 30cc/kg  fluid bolus given and PRBC ordered. PCCM called for admission.   SUBJECTIVE:  Remains on full vent support. Liberated from pressors 5/20  VITAL SIGNS: BP (!) 98/57 (BP Location: Left Arm)   Pulse (!) 113   Temp 99 F (37.2 C) (Core (Comment))   Resp (!) 24   Ht 5\' 6"  (1.676 m)   Wt 221 lb 5.5 oz (100.4 kg)   SpO2 97%   BMI 35.73 kg/m   HEMODYNAMICS:   VENTILATOR SETTINGS: Vent Mode: PRVC FiO2 (%):  [30 %] 30 % Set Rate:  [24 bmp] 24 bmp Vt Set:  [500 mL] 500 mL PEEP:  [5 cmH20] 5 cmH20 Plateau Pressure:  [22 cmH20-27 cmH20] 24 cmH20  INTAKE / OUTPUT: I/O last 3 completed shifts: In: 4385.8 [I.V.:2308.3; Other:110; NG/GT:1780; IV Piggyback:187.5] Out: 1945 [Urine:1945]  PHYSICAL EXAMINATION:  General - awake and alert, on full vent support Eyes - PERRLA ENT - trach clean dry and intact, site is secure Cardiac -irregular RR, no MRG Chest - crackles and rhonchi noted  Abd - soft, non tender, disended Ext - 1+ edema to lower extremities Skin - intact without rash or lesion noted Neuro - deconditioned at baseline Psych - appropriate , calm   LABS:  BMET  Recent Labs Lab 01/23/17 0745 01/24/17 0312 01/25/17 0405  NA 137 137 138  K 4.0 3.8 3.3*  CL 95* 96* 98*  CO2 32 31 31  BUN 71* 70*  66*  CREATININE 2.09* 1.85* 1.53*  GLUCOSE 100* 108* 122*    Electrolytes  Recent Labs Lab 01/23/17 0745 01/23/17 1730 01/24/17 0312 01/24/17 1715 01/25/17 0405  CALCIUM 8.0*  --  7.8*  --  7.8*  MG 3.2* 3.1* 3.2* 3.0*  --   PHOS 2.2* 3.0 2.4* 2.1*  --     CBC  Recent Labs Lab 01/23/17 0745 01/24/17 0312 01/25/17 0405  WBC 13.8* 12.8* 13.2*  HGB 8.2* 7.7* 7.8*  HCT 27.0* 25.9* 26.0*  PLT 166 164 197    Coag's  Recent Labs Lab 01/22/17 1626 01/22/17 1934  APTT 40* 41*  INR 1.26 1.32    Sepsis Markers  Recent Labs Lab 01/22/17 1934 01/22/17 2000 01/22/17 2122 01/23/17 0745 01/24/17 0312  LATICACIDVEN 3.0* 3.08* 2.2*  --   --   PROCALCITON 12.21  --   --  9.01 7.20    ABG  Recent Labs Lab 01/22/17 1832 01/23/17 0345  PHART 7.319* 7.404  PCO2ART 61.8* 51.9*  PO2ART 31.0* 54.3*    Liver Enzymes  Recent Labs Lab 01/22/17 1626  AST 59*  ALT 43  ALKPHOS 255*  BILITOT 1.5*  ALBUMIN 1.6*    Cardiac Enzymes  Recent Labs Lab 01/22/17 1934  TROPONINI 0.11*    Glucose  Recent Labs Lab 01/24/17 1228 01/24/17 1533 01/24/17 2007 01/24/17 2331 01/25/17 0355 01/25/17 0754  GLUCAP 129* 118* 111* 118* 113* 131*    Dg Chest Port 1 View  Result Date: 01/25/2017 CLINICAL DATA:  Respiratory failure EXAM: PORTABLE CHEST 1 VIEW COMPARISON:  Yesterday FINDINGS: Tracheostomy in place. The balloon appears to distend the trachea. Right upper extremity PICC with tip at the right atrium. Diffuse airspace opacity. Cardiomegaly. No visible pneumothorax. IMPRESSION: 1. Widespread edema and/or pneumonia. Opacity appears increased from yesterday. 2. Tracheostomy with cuff distending the trachea. Please correlate with cuff pressure. Electronically Signed   By: Craig Oneal M.D.   On: 01/25/2017 07:15   Dg Chest Port 1 View  Result Date: 01/24/2017 CLINICAL DATA:  66 year old male with history of acute respiratory failure. Shortness of breath. EXAM:  PORTABLE CHEST 1 VIEW COMPARISON:  Chest x-ray 01/23/2017. FINDINGS: A tracheostomy tube is in place with tip 5.9 cm above the carina. There is a right upper extremity PICC with tip terminating in the right atrium approximately 6.8 cm distal to the superior cavoatrial junction. There is cephalization of the pulmonary vasculature and slight indistinctness of the interstitial markings suggestive of mild pulmonary edema. Small left pleural effusion. Heart size is moderately enlarged. The patient is rotated to the right on today's exam, resulting in distortion of the mediastinal contours and reduced diagnostic sensitivity and specificity for mediastinal pathology. IMPRESSION: 1. Support apparatus, as above. 2. The appearance the chest remains most compatible with congestive heart failure, as detailed above. Electronically Signed   By: Craig Oneal M.D.   On: 01/24/2017 07:07    STUDIES:    CULTURES: Bcx ([facility]) 5/16 > Proteus Ucx ([facility]) 5/17 > Providencia and proteus  Blood cultures 5/19 > Urine cultures 5/19 > Sputum cultures 5/19 > pseudomonas aeruginosa and serratia  ANTIBIOTICS: Vancomycin 5/19> Zosyn 5/19>   SIGNIFICANT EVENTS: 5/19 admitted from [facility] 5/20 off pressors  LINES/TUBES: Rt PICC ([facility]) 01/20/17 >  DISCUSSION: 66 year old with cerebral atrophy, chronic trach PEG admitted with sepsis from HCAP pneumonia versus UTI.  Also noted to have low hemoglobin with no obvious sign of active bleed.  ASSESSMENT / PLAN:  PULMONARY A: Acute on chronic respiratory failure COPD Worsening infiltrate/ edema  per CXR P:   Pressure support wean as tolerated Prn BDs F/u CXR Lasix 20 mg x 1 dose  CARDIOVASCULAR A:  Septic shock with elevated lactic acid >> off pressors 5/20. Chronic A fib. Not sure why he is not an anticoagulation as outpt.  Chronic systolic CHF. P:  Resumed lopressor 5/21 Maintain MAP > 65   RENAL A:   CKD 3. Hypokalemia P:   Monitor renal  fx, urine outpt Replete electrolytes as needed ( 5/22) Avoid nephrotoxic drugs maintain renal perfusion  GASTROINTESTINAL A:   Nutrition. Distended abdomen P:   Tube feeds Ensure regular BM's  Continue laxatives as ordered   HEMATOLOGIC A:   Anemia of critical illness and chronic disease. Hb 4.6 on admission >> no signs of bleeding. P:  Monitor Hb Monitor for any obvious s/s of bleeding Transfuse for Hb < 7 Consider  GI evaluation Continue feosol  INFECTIOUS A:   Septic shock 2nd to UTI. Sputum and Urine cultures positive as noted above P:  Contact Precautions initiated  Continue zosyn  Vancomycin d/c 5/21 Awaiting sensitivities per [facility], will need to adjust antibiotics for coverage  ENDOCRINE A:   Hyperglycemia Hypothyroidism P:   SSI Synthroid  NEUROLOGIC A:   Spinal cerebellar ataxia Quadriplegia P:   Resume baclofen 5/21  Consider transfer back to [facility]  APP CC  time 30 minutes  Bevelyn NgoSarah F. Daivik Overley, AGACNP-BC [facility]monary[facility]Critical [facility] Medicine Pager # [phone] 01/25/2017, 10:11 AM After 3pm call: [phone]

## 2017-01-25 NOTE — Clinical Social Work Note (Signed)
Clinical Social Worker facilitated patient discharge including contacting patient family and facility to confirm patient discharge plans.  Clinical information faxed to facility and family agreeable with plan.  CSW arranged ambulance transport via [facility] to [facility] .  RN to call for report prior to discharge.  Clinical Social Worker will sign off for now as social work intervention is no longer needed. Please consult us again if new need arises.  MaranaBridget Ellery, [place] [phone]

## 2017-01-27 LAB — CULTURE, BLOOD (ROUTINE X 2)
CULTURE: NO GROWTH
Culture: NO GROWTH
SPECIAL REQUESTS: ADEQUATE
Special Requests: ADEQUATE

## 2017-01-27 LAB — GLUCOSE, CAPILLARY: GLUCOSE-CAPILLARY: 189 mg/dL — AB (ref 65–99)

## 2017-08-01 ENCOUNTER — Inpatient Hospital Stay (HOSPITAL_COMMUNITY)
Admission: EM | Admit: 2017-08-01 | Discharge: 2017-08-20 | DRG: 870 | Disposition: A | Discharge status: Skilled Nursing Facility | Payer: Medicare Other | Source: Other Acute Inpatient Hospital | Attending: Internal Medicine | Admitting: Internal Medicine

## 2017-08-01 ENCOUNTER — Encounter (HOSPITAL_COMMUNITY): Payer: Self-pay | Admitting: Emergency Medicine

## 2017-08-01 ENCOUNTER — Other Ambulatory Visit: Payer: Self-pay

## 2017-08-01 ENCOUNTER — Emergency Department (HOSPITAL_COMMUNITY): Payer: Medicare Other

## 2017-08-01 DIAGNOSIS — J9621 Acute and chronic respiratory failure with hypoxia: Secondary | ICD-10-CM | POA: Diagnosis present

## 2017-08-01 DIAGNOSIS — E871 Hypo-osmolality and hyponatremia: Secondary | ICD-10-CM | POA: Diagnosis not present

## 2017-08-01 DIAGNOSIS — G319 Degenerative disease of nervous system, unspecified: Secondary | ICD-10-CM | POA: Diagnosis present

## 2017-08-01 DIAGNOSIS — Z930 Tracheostomy status: Secondary | ICD-10-CM | POA: Diagnosis not present

## 2017-08-01 DIAGNOSIS — B49 Unspecified mycosis: Secondary | ICD-10-CM

## 2017-08-01 DIAGNOSIS — Z7401 Bed confinement status: Secondary | ICD-10-CM | POA: Diagnosis not present

## 2017-08-01 DIAGNOSIS — A498 Other bacterial infections of unspecified site: Secondary | ICD-10-CM

## 2017-08-01 DIAGNOSIS — G9349 Other encephalopathy: Secondary | ICD-10-CM | POA: Diagnosis present

## 2017-08-01 DIAGNOSIS — L89152 Pressure ulcer of sacral region, stage 2: Secondary | ICD-10-CM | POA: Diagnosis present

## 2017-08-01 DIAGNOSIS — E873 Alkalosis: Secondary | ICD-10-CM | POA: Diagnosis not present

## 2017-08-01 DIAGNOSIS — J151 Pneumonia due to Pseudomonas: Secondary | ICD-10-CM | POA: Diagnosis present

## 2017-08-01 DIAGNOSIS — J9600 Acute respiratory failure, unspecified whether with hypoxia or hypercapnia: Secondary | ICD-10-CM | POA: Diagnosis present

## 2017-08-01 DIAGNOSIS — N136 Pyonephrosis: Secondary | ICD-10-CM | POA: Diagnosis present

## 2017-08-01 DIAGNOSIS — Z9911 Dependence on respirator [ventilator] status: Secondary | ICD-10-CM | POA: Diagnosis not present

## 2017-08-01 DIAGNOSIS — B379 Candidiasis, unspecified: Secondary | ICD-10-CM | POA: Diagnosis not present

## 2017-08-01 DIAGNOSIS — R319 Hematuria, unspecified: Secondary | ICD-10-CM | POA: Diagnosis present

## 2017-08-01 DIAGNOSIS — N319 Neuromuscular dysfunction of bladder, unspecified: Secondary | ICD-10-CM | POA: Diagnosis present

## 2017-08-01 DIAGNOSIS — E875 Hyperkalemia: Secondary | ICD-10-CM | POA: Diagnosis present

## 2017-08-01 DIAGNOSIS — B952 Enterococcus as the cause of diseases classified elsewhere: Secondary | ICD-10-CM | POA: Diagnosis present

## 2017-08-01 DIAGNOSIS — B377 Candidal sepsis: Secondary | ICD-10-CM | POA: Diagnosis present

## 2017-08-01 DIAGNOSIS — S78111A Complete traumatic amputation at level between right hip and knee, initial encounter: Secondary | ICD-10-CM

## 2017-08-01 DIAGNOSIS — D62 Acute posthemorrhagic anemia: Secondary | ICD-10-CM

## 2017-08-01 DIAGNOSIS — J810 Acute pulmonary edema: Secondary | ICD-10-CM | POA: Insufficient documentation

## 2017-08-01 DIAGNOSIS — R6521 Severe sepsis with septic shock: Secondary | ICD-10-CM | POA: Diagnosis present

## 2017-08-01 DIAGNOSIS — J189 Pneumonia, unspecified organism: Secondary | ICD-10-CM

## 2017-08-01 DIAGNOSIS — Z7901 Long term (current) use of anticoagulants: Secondary | ICD-10-CM

## 2017-08-01 DIAGNOSIS — Y95 Nosocomial condition: Secondary | ICD-10-CM | POA: Diagnosis present

## 2017-08-01 DIAGNOSIS — K5900 Constipation, unspecified: Secondary | ICD-10-CM | POA: Diagnosis not present

## 2017-08-01 DIAGNOSIS — R532 Functional quadriplegia: Secondary | ICD-10-CM | POA: Diagnosis present

## 2017-08-01 DIAGNOSIS — Z6824 Body mass index (BMI) 24.0-24.9, adult: Secondary | ICD-10-CM

## 2017-08-01 DIAGNOSIS — R1310 Dysphagia, unspecified: Secondary | ICD-10-CM

## 2017-08-01 DIAGNOSIS — R140 Abdominal distension (gaseous): Secondary | ICD-10-CM | POA: Diagnosis not present

## 2017-08-01 DIAGNOSIS — N179 Acute kidney failure, unspecified: Secondary | ICD-10-CM | POA: Diagnosis not present

## 2017-08-01 DIAGNOSIS — A491 Streptococcal infection, unspecified site: Secondary | ICD-10-CM

## 2017-08-01 DIAGNOSIS — J9382 Other air leak: Secondary | ICD-10-CM | POA: Diagnosis not present

## 2017-08-01 DIAGNOSIS — J13 Pneumonia due to Streptococcus pneumoniae: Secondary | ICD-10-CM | POA: Diagnosis not present

## 2017-08-01 DIAGNOSIS — I272 Pulmonary hypertension, unspecified: Secondary | ICD-10-CM

## 2017-08-01 DIAGNOSIS — Y833 Surgical operation with formation of external stoma as the cause of abnormal reaction of the patient, or of later complication, without mention of misadventure at the time of the procedure: Secondary | ICD-10-CM | POA: Diagnosis present

## 2017-08-01 DIAGNOSIS — G9340 Encephalopathy, unspecified: Secondary | ICD-10-CM | POA: Diagnosis not present

## 2017-08-01 DIAGNOSIS — I361 Nonrheumatic tricuspid (valve) insufficiency: Secondary | ICD-10-CM | POA: Diagnosis not present

## 2017-08-01 DIAGNOSIS — N170 Acute kidney failure with tubular necrosis: Secondary | ICD-10-CM | POA: Diagnosis not present

## 2017-08-01 DIAGNOSIS — K9423 Gastrostomy malfunction: Secondary | ICD-10-CM | POA: Diagnosis present

## 2017-08-01 DIAGNOSIS — Z89511 Acquired absence of right leg below knee: Secondary | ICD-10-CM | POA: Diagnosis not present

## 2017-08-01 DIAGNOSIS — Z7951 Long term (current) use of inhaled steroids: Secondary | ICD-10-CM

## 2017-08-01 DIAGNOSIS — E870 Hyperosmolality and hypernatremia: Secondary | ICD-10-CM

## 2017-08-01 DIAGNOSIS — R6520 Severe sepsis without septic shock: Secondary | ICD-10-CM

## 2017-08-01 DIAGNOSIS — Z228 Carrier of other infectious diseases: Secondary | ICD-10-CM | POA: Diagnosis not present

## 2017-08-01 DIAGNOSIS — I5022 Chronic systolic (congestive) heart failure: Secondary | ICD-10-CM

## 2017-08-01 DIAGNOSIS — R58 Hemorrhage, not elsewhere classified: Secondary | ICD-10-CM | POA: Diagnosis present

## 2017-08-01 DIAGNOSIS — E860 Dehydration: Secondary | ICD-10-CM | POA: Diagnosis present

## 2017-08-01 DIAGNOSIS — J440 Chronic obstructive pulmonary disease with acute lower respiratory infection: Secondary | ICD-10-CM | POA: Diagnosis present

## 2017-08-01 DIAGNOSIS — N2889 Other specified disorders of kidney and ureter: Secondary | ICD-10-CM | POA: Diagnosis present

## 2017-08-01 DIAGNOSIS — I482 Chronic atrial fibrillation, unspecified: Secondary | ICD-10-CM

## 2017-08-01 DIAGNOSIS — Z1624 Resistance to multiple antibiotics: Secondary | ICD-10-CM

## 2017-08-01 DIAGNOSIS — K819 Cholecystitis, unspecified: Secondary | ICD-10-CM | POA: Diagnosis not present

## 2017-08-01 DIAGNOSIS — Z89611 Acquired absence of right leg above knee: Secondary | ICD-10-CM

## 2017-08-01 DIAGNOSIS — X58XXXA Exposure to other specified factors, initial encounter: Secondary | ICD-10-CM | POA: Diagnosis present

## 2017-08-01 DIAGNOSIS — E876 Hypokalemia: Secondary | ICD-10-CM

## 2017-08-01 DIAGNOSIS — R0902 Hypoxemia: Secondary | ICD-10-CM | POA: Diagnosis present

## 2017-08-01 DIAGNOSIS — A419 Sepsis, unspecified organism: Secondary | ICD-10-CM | POA: Diagnosis not present

## 2017-08-01 DIAGNOSIS — Z79890 Hormone replacement therapy (postmenopausal): Secondary | ICD-10-CM

## 2017-08-01 DIAGNOSIS — R509 Fever, unspecified: Secondary | ICD-10-CM | POA: Diagnosis not present

## 2017-08-01 DIAGNOSIS — N1330 Unspecified hydronephrosis: Secondary | ICD-10-CM

## 2017-08-01 DIAGNOSIS — Z87440 Personal history of urinary (tract) infections: Secondary | ICD-10-CM | POA: Diagnosis not present

## 2017-08-01 DIAGNOSIS — I4891 Unspecified atrial fibrillation: Secondary | ICD-10-CM | POA: Diagnosis not present

## 2017-08-01 DIAGNOSIS — R4182 Altered mental status, unspecified: Secondary | ICD-10-CM | POA: Diagnosis present

## 2017-08-01 DIAGNOSIS — Z978 Presence of other specified devices: Secondary | ICD-10-CM

## 2017-08-01 DIAGNOSIS — L89312 Pressure ulcer of right buttock, stage 2: Secondary | ICD-10-CM | POA: Diagnosis present

## 2017-08-01 DIAGNOSIS — Z1621 Resistance to vancomycin: Secondary | ICD-10-CM | POA: Diagnosis present

## 2017-08-01 DIAGNOSIS — K56609 Unspecified intestinal obstruction, unspecified as to partial versus complete obstruction: Secondary | ICD-10-CM

## 2017-08-01 DIAGNOSIS — Z7902 Long term (current) use of antithrombotics/antiplatelets: Secondary | ICD-10-CM

## 2017-08-01 DIAGNOSIS — K810 Acute cholecystitis: Secondary | ICD-10-CM | POA: Diagnosis not present

## 2017-08-01 DIAGNOSIS — Z960 Presence of urogenital implants: Secondary | ICD-10-CM

## 2017-08-01 DIAGNOSIS — Z79899 Other long term (current) drug therapy: Secondary | ICD-10-CM

## 2017-08-01 DIAGNOSIS — I480 Paroxysmal atrial fibrillation: Secondary | ICD-10-CM | POA: Diagnosis present

## 2017-08-01 DIAGNOSIS — I69391 Dysphagia following cerebral infarction: Secondary | ICD-10-CM

## 2017-08-01 DIAGNOSIS — B9689 Other specified bacterial agents as the cause of diseases classified elsewhere: Secondary | ICD-10-CM | POA: Diagnosis not present

## 2017-08-01 DIAGNOSIS — S3121XA Laceration without foreign body of penis, initial encounter: Secondary | ICD-10-CM

## 2017-08-01 DIAGNOSIS — N390 Urinary tract infection, site not specified: Secondary | ICD-10-CM | POA: Diagnosis not present

## 2017-08-01 DIAGNOSIS — J9610 Chronic respiratory failure, unspecified whether with hypoxia or hypercapnia: Secondary | ICD-10-CM

## 2017-08-01 DIAGNOSIS — I5032 Chronic diastolic (congestive) heart failure: Secondary | ICD-10-CM | POA: Diagnosis not present

## 2017-08-01 DIAGNOSIS — E669 Obesity, unspecified: Secondary | ICD-10-CM | POA: Diagnosis present

## 2017-08-01 DIAGNOSIS — N183 Chronic kidney disease, stage 3 (moderate): Secondary | ICD-10-CM | POA: Diagnosis present

## 2017-08-01 DIAGNOSIS — E039 Hypothyroidism, unspecified: Secondary | ICD-10-CM | POA: Diagnosis present

## 2017-08-01 HISTORY — DX: Tracheostomy status: Z93.0

## 2017-08-01 LAB — URINALYSIS, ROUTINE W REFLEX MICROSCOPIC
Bilirubin Urine: NEGATIVE
GLUCOSE, UA: NEGATIVE mg/dL
Ketones, ur: NEGATIVE mg/dL
Nitrite: NEGATIVE
PH: 5.5 (ref 5.0–8.0)
Protein, ur: 100 mg/dL — AB
SPECIFIC GRAVITY, URINE: 1.02 (ref 1.005–1.030)

## 2017-08-01 LAB — COMPREHENSIVE METABOLIC PANEL
ALBUMIN: 2.3 g/dL — AB (ref 3.5–5.0)
ALK PHOS: 229 U/L — AB (ref 38–126)
ALT: 71 U/L — ABNORMAL HIGH (ref 17–63)
ANION GAP: 13 (ref 5–15)
AST: 55 U/L — ABNORMAL HIGH (ref 15–41)
BUN: 213 mg/dL — ABNORMAL HIGH (ref 6–20)
CALCIUM: 11.7 mg/dL — AB (ref 8.9–10.3)
CHLORIDE: 106 mmol/L (ref 101–111)
CO2: 23 mmol/L (ref 22–32)
Creatinine, Ser: 4.74 mg/dL — ABNORMAL HIGH (ref 0.61–1.24)
GFR calc non Af Amer: 12 mL/min — ABNORMAL LOW (ref >60)
GFR, EST AFRICAN AMERICAN: 14 mL/min — AB (ref >60)
GLUCOSE: 129 mg/dL — AB (ref 65–99)
Potassium: 6.5 mmol/L (ref 3.5–5.1)
SODIUM: 142 mmol/L (ref 135–145)
Total Bilirubin: 0.6 mg/dL (ref 0.3–1.2)
Total Protein: 9.6 g/dL — ABNORMAL HIGH (ref 6.5–8.1)

## 2017-08-01 LAB — CBC WITH DIFFERENTIAL/PLATELET
BASOS ABS: 0 10*3/uL (ref 0.0–0.1)
BASOS PCT: 0 %
Eosinophils Absolute: 0.3 10*3/uL (ref 0.0–0.7)
Eosinophils Relative: 2 %
HCT: 26.9 % — ABNORMAL LOW (ref 39.0–52.0)
HEMOGLOBIN: 8.3 g/dL — AB (ref 13.0–17.0)
LYMPHS PCT: 9 %
Lymphs Abs: 1.5 10*3/uL (ref 0.7–4.0)
MCH: 24 pg — ABNORMAL LOW (ref 26.0–34.0)
MCHC: 30.9 g/dL (ref 30.0–36.0)
MCV: 77.7 fL — AB (ref 78.0–100.0)
MONOS PCT: 5 %
Monocytes Absolute: 0.9 10*3/uL (ref 0.1–1.0)
NEUTROS PCT: 84 %
Neutro Abs: 14.2 10*3/uL — ABNORMAL HIGH (ref 1.7–7.7)
Platelets: 154 10*3/uL (ref 150–400)
RBC: 3.46 MIL/uL — ABNORMAL LOW (ref 4.22–5.81)
RDW: 16.9 % — ABNORMAL HIGH (ref 11.5–15.5)
WBC: 17 10*3/uL — ABNORMAL HIGH (ref 4.0–10.5)

## 2017-08-01 LAB — URINALYSIS, MICROSCOPIC (REFLEX): SQUAMOUS EPITHELIAL / LPF: NONE SEEN

## 2017-08-01 LAB — I-STAT CG4 LACTIC ACID, ED
LACTIC ACID, VENOUS: 1.2 mmol/L (ref 0.5–1.9)
Lactic Acid, Venous: 0.82 mmol/L (ref 0.5–1.9)

## 2017-08-01 LAB — I-STAT ARTERIAL BLOOD GAS, ED
ACID-BASE DEFICIT: 3 mmol/L — AB (ref 0.0–2.0)
Bicarbonate: 22.1 mmol/L (ref 20.0–28.0)
O2 Saturation: 96 %
PH ART: 7.386 (ref 7.350–7.450)
PO2 ART: 84 mmHg (ref 83.0–108.0)
TCO2: 23 mmol/L (ref 22–32)
pCO2 arterial: 36.8 mmHg (ref 32.0–48.0)

## 2017-08-01 LAB — PROTIME-INR
INR: 1.18
Prothrombin Time: 14.9 seconds (ref 11.4–15.2)

## 2017-08-01 LAB — I-STAT CHEM 8, ED
Calcium, Ion: 1.35 mmol/L (ref 1.15–1.40)
Chloride: 117 mmol/L — ABNORMAL HIGH (ref 101–111)
Creatinine, Ser: 4.5 mg/dL — ABNORMAL HIGH (ref 0.61–1.24)
Glucose, Bld: 123 mg/dL — ABNORMAL HIGH (ref 65–99)
HEMATOCRIT: 22 % — AB (ref 39.0–52.0)
HEMOGLOBIN: 7.5 g/dL — AB (ref 13.0–17.0)
Potassium: 5.6 mmol/L — ABNORMAL HIGH (ref 3.5–5.1)
SODIUM: 146 mmol/L — AB (ref 135–145)
TCO2: 23 mmol/L (ref 22–32)

## 2017-08-01 LAB — APTT: aPTT: 21 seconds — ABNORMAL LOW (ref 24–36)

## 2017-08-01 LAB — BRAIN NATRIURETIC PEPTIDE: B NATRIURETIC PEPTIDE 5: 316.7 pg/mL — AB (ref 0.0–100.0)

## 2017-08-01 LAB — I-STAT TROPONIN, ED: Troponin i, poc: 0.03 ng/mL (ref 0.00–0.08)

## 2017-08-01 LAB — PHOSPHORUS: PHOSPHORUS: 4.1 mg/dL (ref 2.5–4.6)

## 2017-08-01 LAB — MAGNESIUM: Magnesium: 3.1 mg/dL — ABNORMAL HIGH (ref 1.7–2.4)

## 2017-08-01 MED ORDER — PIPERACILLIN-TAZOBACTAM 3.375 G IVPB 30 MIN: 3.3750 g | Freq: Once | INTRAVENOUS | Status: DC

## 2017-08-01 MED ORDER — SODIUM CHLORIDE 0.9 % IV BOLUS (SEPSIS): 1000.0000 mL | Freq: Once | INTRAVENOUS | Status: DC

## 2017-08-01 MED ORDER — ACETAMINOPHEN 650 MG RE SUPP
650.0000 mg | Freq: Once | RECTAL | Status: AC
  Administered 2017-08-01: 650 mg via RECTAL

## 2017-08-01 MED ORDER — DEXTROSE 5 % IV SOLN
2.0000 g | Freq: Once | INTRAVENOUS | Status: AC
  Administered 2017-08-01: 2 g via INTRAVENOUS
  Filled 2017-08-01: qty 2

## 2017-08-01 MED ORDER — SODIUM CHLORIDE 0.9 % IV BOLUS (SEPSIS)
1000.0000 mL | Freq: Once | INTRAVENOUS | Status: AC
  Administered 2017-08-01: 1000 mL via INTRAVENOUS

## 2017-08-01 MED ORDER — HEPARIN SODIUM (PORCINE) 5000 UNIT/ML IJ SOLN
5000.0000 [IU] | Freq: Three times a day (TID) | INTRAMUSCULAR | Status: DC
  Administered 2017-08-02 – 2017-08-06 (×10): 5000 [IU] via SUBCUTANEOUS
  Filled 2017-08-01 (×13): qty 1

## 2017-08-01 MED ORDER — ACETAMINOPHEN 325 MG PO TABS
650.0000 mg | ORAL_TABLET | Freq: Four times a day (QID) | ORAL | Status: DC | PRN
  Administered 2017-08-02: 650 mg via ORAL
  Filled 2017-08-01: qty 2

## 2017-08-01 MED ORDER — VANCOMYCIN HCL IN DEXTROSE 1-5 GM/200ML-% IV SOLN: 1000.0000 mg | Freq: Once | INTRAVENOUS | Status: DC

## 2017-08-01 MED ORDER — VANCOMYCIN HCL 10 G IV SOLR
1500.0000 mg | Freq: Once | INTRAVENOUS | Status: AC
  Administered 2017-08-01: 1500 mg via INTRAVENOUS
  Filled 2017-08-01: qty 1500

## 2017-08-01 MED ORDER — METOPROLOL TARTRATE 5 MG/5ML IV SOLN
5.0000 mg | INTRAVENOUS | Status: DC | PRN
  Administered 2017-08-01: 5 mg via INTRAVENOUS
  Filled 2017-08-01: qty 5

## 2017-08-01 MED ORDER — IPRATROPIUM-ALBUTEROL 0.5-2.5 (3) MG/3ML IN SOLN
3.0000 mL | Freq: Four times a day (QID) | RESPIRATORY_TRACT | Status: DC
  Administered 2017-08-01 – 2017-08-08 (×27): 3 mL via RESPIRATORY_TRACT
  Filled 2017-08-01 (×27): qty 3

## 2017-08-01 MED ORDER — PANTOPRAZOLE SODIUM 40 MG IV SOLR
40.0000 mg | Freq: Every day | INTRAVENOUS | Status: DC
  Administered 2017-08-02: 40 mg via INTRAVENOUS
  Filled 2017-08-01: qty 40

## 2017-08-01 MED ORDER — SODIUM CHLORIDE 0.9 % IV BOLUS (SEPSIS)
500.0000 mL | Freq: Once | INTRAVENOUS | Status: AC
  Administered 2017-08-01: 500 mL via INTRAVENOUS

## 2017-08-01 MED ORDER — LEVOTHYROXINE SODIUM 125 MCG PO TABS: 125.0000 ug | ORAL_TABLET | Freq: Every day | ORAL | Status: DC

## 2017-08-01 MED ORDER — HEPARIN SODIUM (PORCINE) 5000 UNIT/ML IJ SOLN: 5000.0000 [IU] | Freq: Three times a day (TID) | INTRAMUSCULAR | Status: DC

## 2017-08-01 MED ORDER — SODIUM CHLORIDE 0.9 % IV SOLN
INTRAVENOUS | Status: DC
  Administered 2017-08-02: 100 mL/h via INTRAVENOUS

## 2017-08-01 NOTE — ED Provider Notes (Signed)
[facility] Provider Note   CSN: [id number] Arrival date & time: 08/01/17  1651     History   Chief Complaint Chief Complaint  Patient presents with  . Altered Mental Status  . Weakness    HPI Craig DiegoCharlie J Radcliffe Jr. is a 66 y.o. male.  HPI: 66 year old male. Patient from [facility] vent dependent unit.  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 Requiring tracheostomy, PEG tube, Foley. Frequent UTIs. Patient has PICC line placed today. Has had fevers to 102 for last 4 days. Orders placed to start Maxipime, and amikacin as patient has history of CR E. No at about a started as she had. Blood cultures, urine culture, were obtai and sent to lab on Friday. No pulmonary results as yet.  Patient became more tachycardic. Has not been hypotensive. Today, Jencarlos developed A. Fib with RVR. Daughter requested transfer to acute care hospital.  Daughter sees him every Friday through Sunday. She dries [place]. She states normally he is interactive and speaks. He is on vent at night only. Talks, and interacts. but is bedridden during the day.    Past Medical History:  Diagnosis Date  . A-fib (HCC)   . Hx of AKA (above knee amputation), right (HCC)   . Tracheostomy in place [facility](HCC)     Patient Active Problem List   Diagnosis Date Noted  . Pressure injury of skin 01/23/2017  . Septic shock (HCC) 01/22/2017    History reviewed. No pertinent surgical history.     Home Medications    Prior to Admission medications   Medication Sig Start Date End Date Taking? Authorizing Provider  acetaminophen (TYLENOL) 325 MG tablet Place 650 mg into feeding tube every 6 (six) hours as needed for mild pain (temp greater than than 100.6).    Yes [provider]  aluminum-magnesium hydroxide 200-200 MG/5ML suspension Place 30 mLs into feeding tube every 6 (six) hours as needed (gas).    Yes [provider]  amikacin (AMIKIN) IVPB Inject 100 mg into the vein daily at 6 (six) AM. Order date 08/02/17 - 14 day course for UTI   Yes [provider]  Amino Acids-Protein Hydrolys (FEEDING SUPPLEMENT, PRO-STAT SUGAR FREE 64,) LIQD Take 30 mLs by mouth 2 (two) times daily.   Yes [provider]  atorvastatin (LIPITOR) 10 MG tablet Place 10 mg into feeding tube at bedtime.    Yes [provider]  baclofen (LIORESAL) 10 MG tablet Place 0.5 tablets (5 mg total) into feeding tube 2 (two) times daily. 01/25/17  Yes [name], [name], MD  Cefepime HCl 2 GM/100ML SOLN Inject 2 g into the vein 2 (two) times daily. Order date 08/01/17: 14 day course for UTI   Yes [provider]  chlorhexidine (PERIDEX) 0.12 % solution Use as directed 15 mLs in the mouth or throat 2 (two) times daily.    Yes [provider]  clonazePAM (KLONOPIN) 0.5 MG tablet Place 0.25 mg into feeding tube at bedtime.    Yes [provider]  Darbepoetin Alfa 25 MCG/ML SOLN Inject 25 mcg as directed every 28 (twenty-eight) days.    Yes [provider]  docusate sodium (COLACE) 100 MG capsule 100 mg 2 (two) times daily.   Yes [provider]  ferrous sulfate 300 (60 Fe) MG/5ML syrup Place 5 mLs (300 mg total) into feeding tube daily with breakfast. Patient taking differently: Place 300 mg into feeding tube 2 (two) times daily  with a meal.  01/26/17  Yes Sood, [name], MD  fluticasone (FLONASE) 50 MCG/ACT nasal spray Place 2 sprays into both nostrils daily.    Yes [provider]  furosemide (LASIX) 20 MG tablet Place 20 mg into feeding tube every other day.   Yes [provider]  heparin 5000 UNIT/ML injection Inject 5,000 Units into the skin every 12 (twelve) hours. For prophylactic   Yes [provider]  ipratropium-albuterol (DUONEB) 0.5-2.5 (3) MG/3ML SOLN Take 3 mLs by nebulization every 2 (two) hours as needed. Patient taking differently: Take  3 mLs by nebulization every 6 (six) hours.  01/25/17  Yes Coralyn Helling, MD  lactulose (CHRONULAC) 10 GM/15ML solution Place 20 g into feeding tube every 12 (twelve) hours. Hold for regular BMs X 3 days   Yes [provider]  lansoprazole (PREVACID) 30 MG capsule Place 30 mg into feeding tube daily before breakfast.   Yes [provider]  levothyroxine (SYNTHROID, LEVOTHROID) 125 MCG tablet Place 125 mcg into feeding tube daily before breakfast.   Yes [provider]  loratadine (CLARITIN) 10 MG tablet Place 10 mg into feeding tube daily.    Yes [provider]  metoprolol tartrate (LOPRESSOR) 25 MG tablet Place 0.5 tablets (12.5 mg total) into feeding tube 2 (two) times daily. 01/25/17  Yes Coralyn Helling, MD  montelukast (SINGULAIR) 10 MG tablet Place 10 mg into feeding tube at bedtime.    Yes [provider]  Multiple Vitamin (MULTIVITAMIN WITH MINERALS) TABS tablet Place 1 tablet into feeding tube daily.    Yes [provider]  Nutritional Supplements (NEPRO) LIQD Place 1,200 mLs into feeding tube See admin instructions. 60 m./hr via pump per G-tube - start time 10am, run until 0600 or until total volume of 1200 ml is infused   Yes [provider]  oxyCODONE (OXY IR/ROXICODONE) 5 MG immediate release tablet Take 5-10 mg by mouth every 6 (six) hours as needed (5 mg for moderate pain, 10 mg for severe pain).   Yes [provider]  polyethylene glycol (MIRALAX / GLYCOLAX) packet Place 17 g into feeding tube every 12 (twelve) hours.    Yes [provider]  potassium chloride (KLOR-CON) 20 MEQ packet Place 20 mEq into feeding tube every 8 (eight) hours.   Yes [provider]  sodium phosphate (FLEET) 7-19 GM/118ML ENEM Place 1 enema rectally daily as needed for severe constipation (constipation).   Yes [provider]  STERILE WATER PO Place 200 mLs into feeding tube every 4 (four) hours.   Yes [provider]  Vitamin D, Ergocalciferol, (DRISDOL) 50000 units CAPS capsule Place 50,000 Units into feeding tube every Friday.    Yes [provider]  Nutritional Supplements (FEEDING SUPPLEMENT, VITAL HIGH PROTEIN,) LIQD liquid Place 1,000 mLs into feeding tube daily. Patient not taking: Reported on 08/01/2017 01/26/17   Coralyn Helling, MD    Family History No family history on file.  Social History Social History   Tobacco Use  . Smoking status: Not on file  Substance Use Topics  . Alcohol use: Not on file  . Drug use: Not on file     Allergies   Patient has no known allergies.   Review of Systems Review of Systems  Unable to perform ROS: Mental status change     Physical Exam Updated Vital Signs BP 99/62   Pulse (!) 126   Resp (!) 23   Ht 5\' 11"  (1.803 m)   Wt  100.2 kg (221 lb)   SpO2 97%   BMI 30.82 kg/m   Physical Exam  Constitutional:  66 year old black male supine in bed. Eyes closed. Will awaken them to voice. Does not attempt to communicate.  HENT:  Conjunctiva are not pale. No scleral icterus. Mucous membranes are dry.  Eyes:  Equal reactive.  Neck:  Tracheostomy site appears well cared. No drainage bleeding source obvious infection  Cardiovascular:  Tachycardic irregular, A. Fib with rapid ventricular response on the monitor. BP 147/73.  Pulmonary/Chest:  Crackles bilateral bases left greater than right.  Abdominal:  Gastrostomy tube left upper quadrant. Site appears without infection.  Genitourinary:  Genitourinary Comments: Foley catheter. Some drainage from the glans. Cloudy amber urine.  Musculoskeletal:  Right AKA. Staple line still present. Wound does not appear dehisced. Has wound covered with dressing in right groin. Not full-thickness. His left lower extremity does not have edema. He is in a protective boot. The skin is intact.  Neurological:  GCS 2+ 1T +6 ( patient will left leg as I attempt to examine him, cooperates)+ 9T    Skin:  Sacral decubitus and wound in right groin. Not full-thickness.     ED Treatments / Results  Labs (all labs ordered are listed, but only abnormal results are displayed) Labs Reviewed  COMPREHENSIVE METABOLIC PANEL - Abnormal; Notable for the following components:      Result Value   Potassium 6.5 (*)    Glucose, Bld 129 (*)    BUN 213 (*)    Creatinine, Ser 4.74 (*)    Calcium 11.7 (*)    Total Protein 9.6 (*)    Albumin 2.3 (*)    AST 55 (*)    ALT 71 (*)    Alkaline Phosphatase 229 (*)    GFR calc non Af Amer 12 (*)    GFR calc Af Amer 14 (*)    All other components within normal limits  CBC WITH DIFFERENTIAL/PLATELET - Abnormal; Notable for the following components:   WBC 17.0 (*)    RBC 3.46 (*)    Hemoglobin 8.3 (*)    HCT 26.9 (*)    MCV 77.7 (*)    MCH 24.0 (*)    RDW 16.9 (*)    Neutro Abs 14.2 (*)    All other components within normal limits  BRAIN NATRIURETIC PEPTIDE - Abnormal; Notable for the following components:   B Natriuretic Peptide 316.7 (*)    All other components within normal limits  I-STAT CHEM 8, ED - Abnormal; Notable for the following components:   Sodium 146 (*)    Potassium 5.6 (*)    Chloride 117 (*)    BUN >140 (*)    Creatinine, Ser 4.50 (*)    Glucose, Bld 123 (*)    Hemoglobin 7.5 (*)    HCT 22.0 (*)    All other components within normal limits  CULTURE, BLOOD (ROUTINE X 2)  CULTURE, BLOOD (ROUTINE X 2)  URINALYSIS, ROUTINE W REFLEX MICROSCOPIC  I-STAT CG4 LACTIC ACID, ED  I-STAT CG4 LACTIC ACID, ED  I-STAT TROPONIN, ED    EKG  EKG Interpretation  Date/Time:  Monday August 01 2017 17:06:56 EST Ventricular Rate:  132 PR Interval:    QRS Duration: 80 QT Interval:  279 QTC Calculation: 414 R Axis:   26 Text Interpretation:  Atrial fibrillation Nonspecific T abnormalities, lateral leads Confirmed by Rolland PorterJames, Emelia Sandoval ([id number]) on 08/01/2017 6:56:02 PM       Radiology Dg Chest 1  View  Result Date:  08/01/2017 CLINICAL DATA:  Fever and altered mental status EXAM: CHEST  1 VIEW COMPARISON:  01/25/2017 FINDINGS: Stable tracheostomy tube. Stable PICC. Heart is upper normal in size. Hazy bilateral central and basilar airspace disease. No pneumothorax. No pleural effusion. IMPRESSION: Hazy bilateral airspace disease suggesting bilateral pneumonia or pulmonary edema. Electronically Signed   By: Jolaine Click M.D.   On: 08/01/2017 18:14    Procedures Procedures (including critical care time)  Medications Ordered in ED Medications  vancomycin (VANCOCIN) 1,500 mg in sodium chloride 0.9 % 500 mL IVPB (1,500 mg Intravenous New Bag/Given 08/01/17 2042)  metoprolol tartrate (LOPRESSOR) injection 5 mg (5 mg Intravenous Given 08/01/17 1902)  sodium chloride 0.9 % bolus 1,000 mL (0 mLs Intravenous Stopped 08/01/17 1813)    And  sodium chloride 0.9 % bolus 1,000 mL (0 mLs Intravenous Stopped 08/01/17 2045)    And  sodium chloride 0.9 % bolus 1,000 mL (0 mLs Intravenous Stopped 08/01/17 1812)    And  sodium chloride 0.9 % bolus 500 mL (0 mLs Intravenous Stopped 08/01/17 2045)  ceFEPIme (MAXIPIME) 2 g in dextrose 5 % 50 mL IVPB (0 g Intravenous Stopped 08/01/17 2045)     Initial Impression / Assessment and Plan / ED Course  I have reviewed the triage vital signs and the nursing notes.  Pertinent labs & imaging results that were available during my care of the patient were reviewed by me and considered in my medical decision making (see chart for details).   full temp 1012. Tachycardic. Not hypotensive. Sepsis protocol initiated.  Results:  Chest x-ray shows probable bilateral infiltrates, differential includes CHF, however. No JVD, no gallop, no dependent edema in his left lower extremity.    Acute kidney injury with creatinine of 4.7 sodium 142 potassium 6.5. WBC 17,000. Cultures obtained 2, one from a new peripheral stick, one from his right antecubital PICC line  Sepsis protocol initiated. We  will cover for probable pneumonia. Given vancomycin,and cefepime. (Pt with tracheal aspirate in July +Serratia and Pseudomonas, both Maxipime sensitive)  Giving fluids, and intermittent doses metoprolol for A. Fib/RVR. We will we check potassium after fluid hydration. He is making urine.  CRITICAL CARE Performed by: Rolland Porter JOSEPH   Total critical care time: 60 minutes  Critical care time was exclusive of separately billable procedures and treating other patients.  Critical care was necessary to treat or prevent imminent or life-threatening deterioration.  Critical care was time spent personally by me on the following activities: development of treatment plan with patient and/or surrogate as well as nursing, discussions with consultants, evaluation of patient's response to treatment, examination of patient, obtaining history from patient or surrogate, ordering and performing treatments and interventions, ordering and review of laboratory studies, ordering and review of radiographic studies, pulse oximetry and re-evaluation of patient's condition.   Discussed with Dr. Celene Skeen of pulmonary critical care. His team is here evaluating the patient for admission to the ICU.  Final Clinical Impressions(s) / ED Diagnoses   Final diagnoses:  Healthcare-associated pneumonia  Hyperkalemia  Acute kidney injury [facility])    ED Discharge Orders    None       Rolland Porter, MD 08/01/17 2113

## 2017-08-01 NOTE — Progress Notes (Signed)
Pharmacy Antibiotic Note  Craig DiegoCharlie J Thoman Jr. is a 66 y.o. male [facility] on 08/01/2017 with fever, weakness, and AMS since Friday, suspected sepsis. CXR with infiltrate  Pharmacy has been consulted for Vancomycin dosing. Patient has chronic trach and PEG to abdomen as well as PICC in R-arm. His BP is soft and he is currently in Afib with rates in 130-150s.   Functional quadriplegic SCr 4.74 with estimated CrCl ~19 mL/min Pt with history of serratia, enterobacter, morganella, and pseudomonas - sensitive to cefepime.   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 Continuing Vancomycin and Cefepime for now.  Plan: Vancomycin 1500mg  IV x1. ED MD dosed Cefepime 2g IV x1.  Will hold further Vancomycin dosing and dose based on levels.  Follow-up for further Cefepime dosing.  Monitor renal function, clinical status, and culture results.     Height: 5\' 11"  (180.3 cm) Weight: 221 lb (100.2 kg) IBW/kg (Calculated) : 75.3  No data recorded.  No results for input(s): WBC, CREATININE, LATICACIDVEN, VANCOTROUGH, VANCOPEAK, VANCORANDOM, GENTTROUGH, GENTPEAK, GENTRANDOM, TOBRATROUGH, TOBRAPEAK, TOBRARND, AMIKACINPEAK, AMIKACINTROU, AMIKACIN in the last 168 hours.  CrCl cannot be calculated (Patient's most recent lab result is older than the maximum 21 days allowed.).    No Known Allergies  Antimicrobials this admission: Vancomycin 11/26 >> Cefepime 11/26 x1  Dose adjustments this admission:   Microbiology results: -Blood cultures ([facility]) >> IP  Thank you for allowing pharmacy to be a part of this patient's care.  Link SnufferJessica Dewie Ahart, PharmD, BCPS, BCCCP Clinical Pharmacist Clinical phone 08/01/2017 until 11PM [phone]- #[phone] After hours, please call #[phone] 08/01/2017 5:24 PM

## 2017-08-01 NOTE — Progress Notes (Signed)
Patient transported on vent from ED to 35M-12 without complication.

## 2017-08-01 NOTE — ED Triage Notes (Addendum)
[facility] with weakness, fever and AMS since Friday. Per daughter, pt had fever of 102-103F, blood cultures were sent to [facility], abx given. Daughter states pt is no better today. Hx of cerebellar atrophy - trach x 1 year, functions as "functional quad" per family. Usually wears vent at night, past two nights, vent has been on 24/7. Picc to R arm, PEG to abdomen, foley in. Full code, CRE positive. BP 110/60, HR 130-150 afib, sats 98%

## 2017-08-01 NOTE — H&P (Signed)
PULMONARY / CRITICAL CARE MEDICINE   Name: Craig Oneal. MRN: [id number] DOB: 07/06/1951    ADMISSION DATE:  08/01/2017 CONSULTATION DATE:  08/01/2017  REFERRING MD:  Dr. Jeneen Rinks  CHIEF COMPLAINT:  Fever, AMS  HISTORY OF PRESENT ILLNESS:  HPI obtained from medical chart review and from daughter at bedside as patient has acute encephalopathy on mechanical ventilator.   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 Blood cultures sent and started on cefepime 11/26 and amikacin 11/27.  Additionally, daughter reports patient normally requires ventilator at night normally but has required continously for the last two days.    Patient has been at [facility] since February 2018; prior to was at [facility].  Hospitalized at [facility] in 01/2017 with proteus bacteremia, septic and hemorrhagic shock.  Additionally has history of serratia and pseudomonas from tracheal aspirate in July, both sensitive to Cefepime, as well as enterobacter and morganella.  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    In ER, initial labs noted for K 6.5, BUN 213, sCr 4.74 (   ), WBC 17, Hgb 8.3, BNP 316, lactic acid 1.2 -> 0.82,  EKG with afib rate 132, CXR with bilateral infiltrates versus edema.  Normotensive in ER.  Treated with sepsis protocol with 2L NS bolus, vancomycin and cefepime given after blood cultures, and lopressor once for HR control.  PCCM to admit patient.     PAST MEDICAL HISTORY :  He  has a past medical history of A-fib ([place]), AKA (above knee amputation), right ([place]), and Tracheostomy in place [facility]).  PAST SURGICAL HISTORY: He  has no past surgical history on  file.  No Known Allergies  No current facility-administered medications on file prior to encounter.    Current Outpatient Medications on File Prior to Encounter  Medication Sig  . acetaminophen (TYLENOL) 325 MG tablet Place 650 mg into feeding tube every 6 (six) hours as needed for mild pain (temp greater than than 100.6).   Marland Kitchen aluminum-magnesium hydroxide 200-200 MG/5ML suspension Place 30 mLs into feeding tube every 6 (six) hours as needed (gas).   Marland Kitchen amikacin (AMIKIN) IVPB Inject 100 mg into the vein daily at 6 (six) AM. Order date 08/02/17 - 14 day course for UTI  . Amino Acids-Protein Hydrolys (FEEDING SUPPLEMENT, PRO-STAT SUGAR FREE 64,) LIQD Take 30 mLs by mouth 2 (two) times daily.  Marland Kitchen atorvastatin (LIPITOR) 10 MG tablet Place 10 mg into feeding tube at bedtime.   . baclofen (LIORESAL) 10 MG tablet Place 0.5 tablets (5 mg total) into feeding tube 2 (two) times daily.  . Cefepime HCl 2 GM/100ML SOLN Inject 2 g into the vein 2 (two) times daily. Order date 08/01/17: 14 day course for UTI  . chlorhexidine (PERIDEX) 0.12 % solution Use as directed 15 mLs in the mouth or throat 2 (two) times daily.   . clonazePAM (KLONOPIN) 0.5 MG tablet Place 0.25 mg into feeding tube at bedtime.   . Darbepoetin Alfa 25 MCG/ML SOLN Inject 25 mcg as directed every 28 (twenty-eight) days.   Marland Kitchen docusate sodium (COLACE) 100 MG capsule 100 mg 2 (two) times daily.  . ferrous sulfate 300 (60 Fe) MG/5ML syrup Place 5 mLs (300  mg total) into feeding tube daily with breakfast. (Patient taking differently: Place 300 mg into feeding tube 2 (two) times daily with a meal. )  . fluticasone (FLONASE) 50 MCG/ACT nasal spray Place 2 sprays into both nostrils daily.   . furosemide (LASIX) 20 MG tablet Place 20 mg into feeding tube every other day.  . heparin 5000 UNIT/ML injection Inject 5,000 Units into the skin every 12 (twelve) hours. For prophylactic  . ipratropium-albuterol (DUONEB) 0.5-2.5 (3) MG/3ML SOLN Take 3 mLs by  nebulization every 2 (two) hours as needed. (Patient taking differently: Take 3 mLs by nebulization every 6 (six) hours. )  . lactulose (CHRONULAC) 10 GM/15ML solution Place 20 g into feeding tube every 12 (twelve) hours. Hold for regular BMs X 3 days  . lansoprazole (PREVACID) 30 MG capsule Place 30 mg into feeding tube daily before breakfast.  . levothyroxine (SYNTHROID, LEVOTHROID) 125 MCG tablet Place 125 mcg into feeding tube daily before breakfast.  . loratadine (CLARITIN) 10 MG tablet Place 10 mg into feeding tube daily.   . metoprolol tartrate (LOPRESSOR) 25 MG tablet Place 0.5 tablets (12.5 mg total) into feeding tube 2 (two) times daily.  . montelukast (SINGULAIR) 10 MG tablet Place 10 mg into feeding tube at bedtime.   . Multiple Vitamin (MULTIVITAMIN WITH MINERALS) TABS tablet Place 1 tablet into feeding tube daily.   . Nutritional Supplements (NEPRO) LIQD Place 1,200 mLs into feeding tube See admin instructions. 60 m./hr via pump per G-tube - start time 10am, run until 0600 or until total volume of 1200 ml is infused  . oxyCODONE (OXY IR/ROXICODONE) 5 MG immediate release tablet Take 5-10 mg by mouth every 6 (six) hours as needed (5 mg for moderate pain, 10 mg for severe pain).  . polyethylene glycol (MIRALAX / GLYCOLAX) packet Place 17 g into feeding tube every 12 (twelve) hours.   . potassium chloride (KLOR-CON) 20 MEQ packet Place 20 mEq into feeding tube every 8 (eight) hours.  . sodium phosphate (FLEET) 7-19 GM/118ML ENEM Place 1 enema rectally daily as needed for severe constipation (constipation).  . STERILE WATER PO Place 200 mLs into feeding tube every 4 (four) hours.  . Vitamin D, Ergocalciferol, (DRISDOL) 50000 units CAPS capsule Place 50,000 Units into feeding tube every Friday.   . Nutritional Supplements (FEEDING SUPPLEMENT, VITAL HIGH PROTEIN,) LIQD liquid Place 1,000 mLs into feeding tube daily. (Patient not taking: Reported on 08/01/2017)    FAMILY HISTORY:  His has  no family status information on file.    SOCIAL HISTORY: Not on file.  REVIEW OF SYSTEMS:   Unable to obtain due to acute encephalopathy/ VDRF  SUBJECTIVE:   VITAL SIGNS: BP (!) 110/58   Pulse 67   Resp (!) 28   Ht 5' 11"  (1.803 m)   Wt 221 lb (100.2 kg)   SpO2 98%   BMI 30.82 kg/m   HEMODYNAMICS:    VENTILATOR SETTINGS: Vent Mode: PRVC FiO2 (%):  [30 %] 30 % Set Rate:  [14 bmp] 14 bmp Vt Set:  [500 mL] 500 mL PEEP:  [5 cmH20] 5 cmH20 Plateau Pressure:  [18 cmH20-22 cmH20] 18 cmH20  INTAKE / OUTPUT: I/O last 3 completed shifts: In: 2000 [IV Piggyback:2000] Out: -   PHYSICAL EXAMINATION: General:  Chronically ill appearing male in NAD on MV  HEENT: MM pale/moist, trach size 6 clean/dry, pupils 3/reactive Neuro: Some eye opening to voice, no f/c, some spont movement of lower extremities CV: IRIR- afib 130's, strong distal pulses  PULM: even/non-labored on MV, lungs bilaterally rhonchi/ rales, no wheezes GI: distended but soft, non-tender, hyperactive BS, Gtube LUQ Extremities: warm/dry, no RLE edema, some RUE edema, R AKA w/staples intact- NO drainage, erythema, or warmth Skin: no rashes or signs of bleeding, right groin wound and sacral decub  LABS:  BMET Recent Labs  Lab 08/01/17 1730 08/01/17 1939  NA 142 146*  K 6.5* 5.6*  CL 106 117*  CO2 23  --   BUN 213* >140*  CREATININE 4.74* 4.50*  GLUCOSE 129* 123*    Electrolytes Recent Labs  Lab 08/01/17 1730  CALCIUM 11.7*    CBC Recent Labs  Lab 08/01/17 1730 08/01/17 1939  WBC 17.0*  --   HGB 8.3* 7.5*  HCT 26.9* 22.0*  PLT 154  --     Coag's No results for input(s): APTT, INR in the last 168 hours.  Sepsis Markers Recent Labs  Lab 08/01/17 1743 08/01/17 1939  LATICACIDVEN 1.20 0.82    ABG No results for input(s): PHART, PCO2ART, PO2ART in the last 168 hours.  Liver Enzymes Recent Labs  Lab 08/01/17 1730  AST 55*  ALT 71*  ALKPHOS 229*  BILITOT 0.6  ALBUMIN 2.3*     Cardiac Enzymes No results for input(s): TROPONINI, PROBNP in the last 168 hours.  Glucose No results for input(s): GLUCAP in the last 168 hours.  Imaging Dg Chest 1 View  Result Date: 08/01/2017 CLINICAL DATA:  Fever and altered mental status EXAM: CHEST  1 VIEW COMPARISON:  01/25/2017 FINDINGS: Stable tracheostomy tube. Stable PICC. Heart is upper normal in size. Hazy bilateral central and basilar airspace disease. No pneumothorax. No pleural effusion. IMPRESSION: Hazy bilateral airspace disease suggesting bilateral pneumonia or pulmonary edema. Electronically Signed   By: Marybelle Killings M.D.   On: 08/01/2017 18:14   STUDIES:  11/26 CXR >> bilateral infiltrates vs edema  TTE >>  CULTURES: Prior cultures at [place] 11/23 ? 11/26 BCx 2 >> 11/26 trach aspirate >> 11/26 UC >>  ANTIBIOTICS: ? Prior Amikacin >> 11/26 Cefepime (at [facility]) >> 11/26 Vancomycin >>  SIGNIFICANT EVENTS: 11/26 Admit  LINES/TUBES: R PICC from [facility] -  Gastrostomy tube Foley>> changed 11/26 >>  DISCUSSION: 77 yoM presenting from [facility] with AMS, fever, and acute on chronic respiratory failure since 11/23 admitted for sepsis and AKI.  ASSESSMENT / PLAN:  PULMONARY A: Acute on chronic respiratory failure  Likely HCAP +/- pulmonary edema  - PRVC rate 24, Vt 500, FiO2 30%, PEEP 5, patient previously only requiring HS vent support P:   Full MV support on prior vent settings for now ABG noted Trend CXR VAP protocol See ID and renal below Duonebs prn Trach care per protocol SBT if meets criteria  CARDIOVASCULAR A:  Afib (does not appear to be on chronic coagulation) Hx HFrEF- prior TTE 08/2016 45-50% - BNP 316 P:  Tele monitor Goal map > 65 Lactic acid normal Trend CVP q 4 Assess TTE  After lopressor IVP- BP soft- so d/c for now Control temp may help with rate control, otherwise may need to start cardizem gtt Trend troponin/ EKG  RENAL A:   AKI on CKD3- still making  urine Hyperkalemia Chronic foley P:   Renal consulted, appreciate input recs for fluid challenge, additional 1L now then NS at 100 ml/hr  Trend BMP / urinary output/ daily weights Replace electrolytes as indicated  GASTROINTESTINAL A:   NPO Gtube Transaminitis - appears chronic based on May 2018 labs P:   NPO  for now Consider starting TF 11/27 PPI for SUP Bowel regimen Trend LFTs as needed  HEMATOLOGIC A:   Anemia- chronic, H/H appear stable, no active s/s bleeding P:  Coags normal Trend CBC Heparin sq and left SCD for VTE ppx  Transfuse for Hgb < 7  INFECTIOUS A:   Sepsis- fever, WBC 17- source likely HCAP +/- UTI, r/o osteo w/sacral decub, bacteremia, R AKA site appears WNL, chronic foley/ PICC Hx of pseudomonas and serratia- trach asp in June 2018 sensitive to cefepime ? CRE per [facility] P:   Pan-culture- w/ BC, UC, and trach asp Continue vanc and cefepime for now Will try and clarify [facility] cultures/ results Consider ID consult Tylenol for fever Trend WBC Assess ESR to rule out osteo Urine strep and legionella pending Exchange out foley  ENDOCRINE A:   Hypothyroidism  P:   CBG q 4 Synthroid 125 mcg per tube  NEUROLOGIC A:   Acute encephalopathy- ddx infection/sepsis vs uremic  Hx cerebral atrophy, paraplegia w/ataxia P:   Frequent Neuro checks  Holding sedation  See ID  FAMILY  - Updates: Daughter, [employer]eds [phone], at bedside and updated.    - Inter-disciplinary family meet or Palliative Care meeting due by: 08/08/2017  CCT 60 mins  Kennieth Rad, AGACNP- [facility] [place] Pager: [phone]  08/01/2017, 9:22 PM

## 2017-08-01 NOTE — ED Notes (Signed)
Resp called to suction pt

## 2017-08-01 NOTE — Consult Note (Signed)
Craig J Pitkin Jr. Admit Date: 08/01/2017 08/01/2017 [name], Craig Okane B Requesting Physician:  Minette HeadlandAljishi MD  Reason for Consult:  Acute Renal Failure HPI:  66 year old male with a long-term resident of [facility] presented to the emergency room today with 4 days of fever and altered mentation.  Patient has a past history of progressive cerebellar atrophy with functional quadriplegia, ventilator dependence, enteral feeding dependence, chronic Foley catheter with frequent urinary tract infections.  He also has a history of a right AKA.   History is obtained from daughter at the bedside as well as from notes and other providers.  He developed fevers at the end of last week.  Cultures were obtained but nothing positive has resulted.  Because of progressive fevers and confusion he was sent to the emergency room.  Here he is 101.6, with atrial fibrillation with rapid ventricular rate on the ventilator with 30% FiO2 and greater than 97% oxygen saturation.  He has had a very large loose bowel movement since admission.  He has received 2 L of normal saline, vancomycin, cefepime.  Labs on 11/20 showed a sodium of 152, potassium 4.8, chloride 106, bicarbonate 32, BUN 91, creatinine 1.4 and his CBC at that time showed a white count of 11 with a hemoglobin 9.7 and platelets of 267.  Labs upon arrival in the emergency room had a BUN of 20-13, creatinine 4.7, sodium 142, potassium 6.5.  Repeat testing showed the potassium improved to 5.6, otherwise not much change.  His hemoglobin is 8.3 and his white count is 17.  One view chest x-ray with bilateral opacity suggestive of either pneumonia or edema.   Creatinine, Ser (mg/dL)  Date Value  78/29/562111/26/2018 4.50 (H)  08/01/2017 4.74 (H)  01/25/2017 1.53 (H)  01/24/2017 1.85 (H)  01/23/2017 2.09 (H)  01/22/2017 2.66 (H)  ] I/Os: I/O last 3 completed shifts: In: 2000 [IV Piggyback:2000] Out: -    ROS NSAIDS: not on SNF MAR IV Contrast no exposrue TMP/SMX not on  SNF MAR Hypotension not on MAR Balance of 12 systems is negative w/ exceptions as above  PMH  Past Medical History:  Diagnosis Date  . A-fib (HCC)   . Hx of AKA (above knee amputation), right ([facility])   . Tracheostomy in place [facility](HCC)    PSH History reviewed. No pertinent surgical history. FH No family history on file. SH  has no tobacco, alcohol, and drug history on file. Allergies No Known Allergies Home medications Prior to Admission medications   Medication Sig Start Date End Date Taking? Authorizing Provider  acetaminophen (TYLENOL) 325 MG tablet Place 650 mg into feeding tube every 6 (six) hours as needed for mild pain (temp greater than than 100.6).    Yes [provider]  aluminum-magnesium hydroxide 200-200 MG/5ML suspension Place 30 mLs into feeding tube every 6 (six) hours as needed (gas).    Yes [provider]  amikacin (AMIKIN) IVPB Inject 100 mg into the vein daily at 6 (six) AM. Order date 08/02/17 - 14 day course for UTI   Yes [provider]  Amino Acids-Protein Hydrolys (FEEDING SUPPLEMENT, PRO-STAT SUGAR FREE 64,) LIQD Take 30 mLs by mouth 2 (two) times daily.   Yes [provider]  atorvastatin (LIPITOR) 10 MG tablet Place 10 mg into feeding tube at bedtime.    Yes [provider]  baclofen (LIORESAL) 10 MG tablet Place 0.5 tablets (5 mg total) into feeding tube 2 (two) times daily. 01/25/17  Yes [name], [name], MD  Cefepime  HCl 2 GM/100ML SOLN Inject 2 g into the vein 2 (two) times daily. Order date 08/01/17: 14 day course for UTI   Yes [provider]  chlorhexidine (PERIDEX) 0.12 % solution Use as directed 15 mLs in the mouth or throat 2 (two) times daily.    Yes [provider]  clonazePAM (KLONOPIN) 0.5 MG tablet Place 0.25 mg into feeding tube at bedtime.    Yes [provider]  Darbepoetin Alfa 25 MCG/ML SOLN Inject 25 mcg as directed every 28 (twenty-eight) days.    Yes [provider]   docusate sodium (COLACE) 100 MG capsule 100 mg 2 (two) times daily.   Yes [provider]  ferrous sulfate 300 (60 Fe) MG/5ML syrup Place 5 mLs (300 mg total) into feeding tube daily with breakfast. Patient taking differently: Place 300 mg into feeding tube 2 (two) times daily with a meal.  01/26/17  Yes Sood, [name], MD  fluticasone (FLONASE) 50 MCG/ACT nasal spray Place 2 sprays into both nostrils daily.    Yes [provider]  furosemide (LASIX) 20 MG tablet Place 20 mg into feeding tube every other day.   Yes [provider]  heparin 5000 UNIT/ML injection Inject 5,000 Units into the skin every 12 (twelve) hours. For prophylactic   Yes [provider]  ipratropium-albuterol (DUONEB) 0.5-2.5 (3) MG/3ML SOLN Take 3 mLs by nebulization every 2 (two) hours as needed. Patient taking differently: Take 3 mLs by nebulization every 6 (six) hours.  01/25/17  Yes Coralyn Helling, MD  lactulose (CHRONULAC) 10 GM/15ML solution Place 20 g into feeding tube every 12 (twelve) hours. Hold for regular BMs X 3 days   Yes [provider]  lansoprazole (PREVACID) 30 MG capsule Place 30 mg into feeding tube daily before breakfast.   Yes [provider]  levothyroxine (SYNTHROID, LEVOTHROID) 125 MCG tablet Place 125 mcg into feeding tube daily before breakfast.   Yes [provider]  loratadine (CLARITIN) 10 MG tablet Place 10 mg into feeding tube daily.    Yes [provider]  metoprolol tartrate (LOPRESSOR) 25 MG tablet Place 0.5 tablets (12.5 mg total) into feeding tube 2 (two) times daily. 01/25/17  Yes Coralyn Helling, MD  montelukast (SINGULAIR) 10 MG tablet Place 10 mg into feeding tube at bedtime.    Yes [provider]  Multiple Vitamin (MULTIVITAMIN WITH MINERALS) TABS tablet Place 1 tablet into feeding tube daily.    Yes [provider]  Nutritional Supplements (NEPRO) LIQD Place 1,200 mLs into feeding tube See admin  instructions. 60 m./hr via pump per G-tube - start time 10am, run until 0600 or until total volume of 1200 ml is infused   Yes [provider]  oxyCODONE (OXY IR/ROXICODONE) 5 MG immediate release tablet Take 5-10 mg by mouth every 6 (six) hours as needed (5 mg for moderate pain, 10 mg for severe pain).   Yes [provider]  polyethylene glycol (MIRALAX / GLYCOLAX) packet Place 17 g into feeding tube every 12 (twelve) hours.    Yes [provider]  potassium chloride (KLOR-CON) 20 MEQ packet Place 20 mEq into feeding tube every 8 (eight) hours.   Yes [provider]  sodium phosphate (FLEET) 7-19 GM/118ML ENEM Place 1 enema rectally daily as needed for severe constipation (constipation).   Yes [provider]  STERILE WATER PO Place 200 mLs into feeding tube every 4 (four) hours.   Yes [provider]  Vitamin D, Ergocalciferol, (DRISDOL) 50000  units CAPS capsule Place 50,000 Units into feeding tube every Friday.    Yes [provider]  Nutritional Supplements (FEEDING SUPPLEMENT, VITAL HIGH PROTEIN,) LIQD liquid Place 1,000 mLs into feeding tube daily. Patient not taking: Reported on 08/01/2017 01/26/17   [name], [name], MD    Current Medications Scheduled Meds: . ipratropium-albuterol  3 mL Nebulization Q6H  . pantoprazole (PROTONIX) IV  40 mg Intravenous QHS   Continuous Infusions: . vancomycin 1,500 mg (08/01/17 2042)   PRN Meds:.acetaminophen  CBC Recent Labs  Lab 08/01/17 1730 08/01/17 1939  WBC 17.0*  --   NEUTROABS 14.2*  --   HGB 8.3* 7.5*  HCT 26.9* 22.0*  MCV 77.7*  --   PLT 154  --    Basic Metabolic Panel Recent Labs  Lab 08/01/17 1730 08/01/17 1939 08/01/17 2146  NA 142 146*  --   K 6.5* 5.6*  --   CL 106 117*  --   CO2 23  --   --   GLUCOSE 129* 123*  --   BUN 213* >140*  --   CREATININE 4.74* 4.50*  --   CALCIUM 11.7*  --   --   PHOS  --   --  4.1    Physical Exam  Blood pressure 116/75,  pulse 67, temperature (!) 101.6 F (38.7 C), temperature source Rectal, resp. rate (!) 28, height 5\' 11"  (1.803 m), weight 100.2 kg (221 lb), SpO2 97 %. GEN: Trach in place on Vent, not interactive ENT: Trach in place EYES: eyes open, doesn't follow CV: tachy IRIR PULM: coars bs b/l ABD: s/nd SKIN: no rashes/lesions EXT:No LEE  Assessment 84M chronic vent dependence with PEG / Foley in SNF admitted with fever, leukocytosis b/l pulm infiltrates and AoCKD with hyperkalemia.    1. AoCKD3: I favor prerenal etiology in the setting of sepsis and pneumonia.  He has no edema.  He had a large loose bowel movement making me wonder if he had another large bowel movements prior to admission.  On the MAR I do not identify any obvious nephrotoxins though he was prescribed to start taking amikacin tomorrow.  His azotemia might be multifactorial including renal failure, enteral nutrition. 2. Sepsis with fever, leukocytosis, tachycardia; likely pulmonary and/or urinary source; prescribed vancomycin and cefepime; outside cultures pending; per critical care medicine 3. VAP 4. Atrial fibrillation with RVR 5. Cerebellar atrophy with functional quadriplegia, ventilator dependence, using Foley and enteral nutrition; SNF resident 6. Hypercalcemia with elevated total protein 7. Hyperkalemia  Plan 1. Continue hydration and frequent monitoring of labs, would use normal saline, every 8 hourly BMP 2. If hypercalcemia and protein do not resolve with hydration consider evaluation for monoclonal process 3. He is not a candidate for long-term dialysis, prior to any short-term dialysis would need to speak frankly with the family about expectations. 4. With potassium now less than 6 I would not use patiromer or Kayexalate; follow with hydration; hold outpatient potassium chloride   Sabra Heckyan Keyleigh Manninen MD [phone] pgr 08/01/2017, 10:34 PM

## 2017-08-01 NOTE — ED Notes (Signed)
Second set of blood cultures not drawn yet from PICC line. Need portable chest xray to verify placement prior to using PICC.

## 2017-08-01 NOTE — ED Notes (Addendum)
Pt had several loose stools. This tech, IT sales professionalCorey RN and Clydie BraunKaren RN assisted in cleaning up pt. Pt's foley bag changed to a new one due to it leaking all over the bed. Denyse Amassorey RN notified

## 2017-08-02 ENCOUNTER — Inpatient Hospital Stay (HOSPITAL_COMMUNITY): Payer: Medicare Other

## 2017-08-02 DIAGNOSIS — J9600 Acute respiratory failure, unspecified whether with hypoxia or hypercapnia: Secondary | ICD-10-CM | POA: Diagnosis present

## 2017-08-02 DIAGNOSIS — Z89511 Acquired absence of right leg below knee: Secondary | ICD-10-CM

## 2017-08-02 DIAGNOSIS — I361 Nonrheumatic tricuspid (valve) insufficiency: Secondary | ICD-10-CM

## 2017-08-02 DIAGNOSIS — G9340 Encephalopathy, unspecified: Secondary | ICD-10-CM

## 2017-08-02 DIAGNOSIS — S78111A Complete traumatic amputation at level between right hip and knee, initial encounter: Secondary | ICD-10-CM

## 2017-08-02 LAB — STREP PNEUMONIAE URINARY ANTIGEN: STREP PNEUMO URINARY ANTIGEN: NEGATIVE

## 2017-08-02 LAB — CBC
HEMATOCRIT: 20.8 % — AB (ref 39.0–52.0)
HEMOGLOBIN: 5.9 g/dL — AB (ref 13.0–17.0)
MCH: 22.3 pg — ABNORMAL LOW (ref 26.0–34.0)
MCHC: 28.4 g/dL — ABNORMAL LOW (ref 30.0–36.0)
MCV: 78.5 fL (ref 78.0–100.0)
Platelets: 129 10*3/uL — ABNORMAL LOW (ref 150–400)
RBC: 2.65 MIL/uL — ABNORMAL LOW (ref 4.22–5.81)
RDW: 16.8 % — ABNORMAL HIGH (ref 11.5–15.5)
WBC: 14.8 10*3/uL — AB (ref 4.0–10.5)

## 2017-08-02 LAB — COMPREHENSIVE METABOLIC PANEL
ALT: 43 U/L (ref 17–63)
AST: 39 U/L (ref 15–41)
Albumin: 1.6 g/dL — ABNORMAL LOW (ref 3.5–5.0)
Alkaline Phosphatase: 153 U/L — ABNORMAL HIGH (ref 38–126)
Anion gap: 6 (ref 5–15)
BUN: 156 mg/dL — ABNORMAL HIGH (ref 6–20)
CHLORIDE: 122 mmol/L — AB (ref 101–111)
CO2: 19 mmol/L — ABNORMAL LOW (ref 22–32)
Calcium: 8.9 mg/dL (ref 8.9–10.3)
Creatinine, Ser: 3.39 mg/dL — ABNORMAL HIGH (ref 0.61–1.24)
GFR, EST AFRICAN AMERICAN: 21 mL/min — AB (ref >60)
GFR, EST NON AFRICAN AMERICAN: 18 mL/min — AB (ref >60)
Glucose, Bld: 96 mg/dL (ref 65–99)
POTASSIUM: 4.3 mmol/L (ref 3.5–5.1)
Sodium: 147 mmol/L — ABNORMAL HIGH (ref 135–145)
TOTAL PROTEIN: 7.2 g/dL (ref 6.5–8.1)
Total Bilirubin: 0.5 mg/dL (ref 0.3–1.2)

## 2017-08-02 LAB — HEMOGLOBIN AND HEMATOCRIT, BLOOD
HCT: 28.2 % — ABNORMAL LOW (ref 39.0–52.0)
Hemoglobin: 8.3 g/dL — ABNORMAL LOW (ref 13.0–17.0)

## 2017-08-02 LAB — SODIUM, URINE, RANDOM: SODIUM UR: 11 mmol/L

## 2017-08-02 LAB — PREPARE RBC (CROSSMATCH)

## 2017-08-02 LAB — GLUCOSE, CAPILLARY
GLUCOSE-CAPILLARY: 107 mg/dL — AB (ref 65–99)
GLUCOSE-CAPILLARY: 84 mg/dL (ref 65–99)
GLUCOSE-CAPILLARY: 95 mg/dL (ref 65–99)
Glucose-Capillary: 83 mg/dL (ref 65–99)

## 2017-08-02 LAB — SEDIMENTATION RATE

## 2017-08-02 LAB — ECHOCARDIOGRAM COMPLETE
Height: 71 in
Weight: 2652.57 oz

## 2017-08-02 LAB — MRSA PCR SCREENING: MRSA BY PCR: NEGATIVE

## 2017-08-02 LAB — CREATININE, URINE, RANDOM: Creatinine, Urine: 79.32 mg/dL

## 2017-08-02 LAB — PROCALCITONIN: Procalcitonin: 7.71 ng/mL

## 2017-08-02 MED ORDER — CHLORHEXIDINE GLUCONATE 0.12% ORAL RINSE (MEDLINE KIT)
15.0000 mL | Freq: Two times a day (BID) | OROMUCOSAL | Status: DC
  Administered 2017-08-02 – 2017-08-20 (×37): 15 mL via OROMUCOSAL

## 2017-08-02 MED ORDER — IOPAMIDOL (ISOVUE-300) INJECTION 61%
INTRAVENOUS | Status: AC
Start: 2017-08-02 — End: 2017-08-02
  Administered 2017-08-02: 18:00:00
  Filled 2017-08-02: qty 30

## 2017-08-02 MED ORDER — DEXTROSE 5 % IV SOLN
1.0000 g | INTRAVENOUS | Status: DC
  Filled 2017-08-02: qty 1

## 2017-08-02 MED ORDER — MIDAZOLAM HCL 2 MG/2ML IJ SOLN: 2.0000 mg | INTRAMUSCULAR | Status: DC | PRN

## 2017-08-02 MED ORDER — IOPAMIDOL (ISOVUE-300) INJECTION 61%
INTRAVENOUS | Status: AC
  Filled 2017-08-02: qty 100

## 2017-08-02 MED ORDER — DILTIAZEM HCL 100 MG IV SOLR
5.0000 mg/h | INTRAVENOUS | Status: DC
  Administered 2017-08-02: 15 mg/h via INTRAVENOUS
  Administered 2017-08-02: 5 mg/h via INTRAVENOUS
  Administered 2017-08-03: 10 mg/h via INTRAVENOUS
  Filled 2017-08-02 (×5): qty 100

## 2017-08-02 MED ORDER — FENTANYL CITRATE (PF) 100 MCG/2ML IJ SOLN: 100.0000 ug | INTRAMUSCULAR | Status: DC | PRN

## 2017-08-02 MED ORDER — SODIUM CHLORIDE 0.9 % IV BOLUS (SEPSIS)
2000.0000 mL | Freq: Once | INTRAVENOUS | Status: AC
  Administered 2017-08-02: 2000 mL via INTRAVENOUS

## 2017-08-02 MED ORDER — SODIUM CHLORIDE 0.45 % IV SOLN
INTRAVENOUS | Status: DC
Start: 2017-08-02 — End: 2017-08-03
  Administered 2017-08-02: 12:00:00 via INTRAVENOUS
  Administered 2017-08-03: 175 mL/h via INTRAVENOUS

## 2017-08-02 MED ORDER — ACETAMINOPHEN 325 MG PO TABS
650.0000 mg | ORAL_TABLET | ORAL | Status: DC | PRN
  Administered 2017-08-04 – 2017-08-07 (×2): 650 mg via ORAL
  Filled 2017-08-02 (×3): qty 2

## 2017-08-02 MED ORDER — ORAL CARE MOUTH RINSE
15.0000 mL | Freq: Four times a day (QID) | OROMUCOSAL | Status: DC
  Administered 2017-08-02 – 2017-08-20 (×70): 15 mL via OROMUCOSAL

## 2017-08-02 MED ORDER — LINEZOLID 600 MG/300ML IV SOLN
600.0000 mg | Freq: Two times a day (BID) | INTRAVENOUS | Status: DC
  Administered 2017-08-02 – 2017-08-04 (×4): 600 mg via INTRAVENOUS
  Filled 2017-08-02 (×5): qty 300

## 2017-08-02 MED ORDER — NOREPINEPHRINE BITARTRATE 1 MG/ML IV SOLN
0.0000 ug/min | INTRAVENOUS | Status: DC
  Administered 2017-08-02: 10 ug/min via INTRAVENOUS
  Administered 2017-08-03: 6 ug/min via INTRAVENOUS
  Filled 2017-08-02 (×4): qty 4

## 2017-08-02 MED ORDER — DEXTROSE 5 % IV SOLN
0.9400 g | Freq: Two times a day (BID) | INTRAVENOUS | Status: DC
  Administered 2017-08-02 – 2017-08-04 (×4): 0.94 g via INTRAVENOUS
  Filled 2017-08-02 (×6): qty 4.51

## 2017-08-02 MED ORDER — VANCOMYCIN HCL IN DEXTROSE 750-5 MG/150ML-% IV SOLN
750.0000 mg | INTRAVENOUS | Status: DC
  Filled 2017-08-02: qty 150

## 2017-08-02 MED ORDER — SODIUM CHLORIDE 0.9 % IV SOLN
Freq: Once | INTRAVENOUS | Status: AC
  Administered 2017-08-02: 13:00:00 via INTRAVENOUS

## 2017-08-02 MED ORDER — LEVOTHYROXINE SODIUM 25 MCG PO TABS
125.0000 ug | ORAL_TABLET | Freq: Every day | ORAL | Status: DC
  Administered 2017-08-02 – 2017-08-20 (×18): 125 ug
  Filled 2017-08-02 (×19): qty 1

## 2017-08-02 NOTE — Progress Notes (Signed)
Pt seen for trach team follow up. No education needed at this time. All needed equipment at the bedside. Will continue to follow.  Jacqulynn CadetHopper, Koua Deeg David RRT

## 2017-08-02 NOTE — Consult Note (Addendum)
WOC Nurse wound consult note Reason for Consult: Consult requested for sacrum and inner groin.  Family member at the bedside states the patient previously had a pressure injury to the sacrum which has healed at this time.  There is pink dry scar tissue which is intact.  Patchy areas of red moist partial thickness skin loss to inner groin, posterior scrotum, and buttocks near rectum; appearance is consistent with moisture associated skin damage and pt is frequently incontinent of loose liquid stool.  Flexiseal inserted to attempt to contain stool.   Wound type: Right inner thigh with several pink dry scar tissue areas which are linear; appearance is consistent with moisture associated skin damage and also a previous medical device prior to admission.  Full thickness skin loss in the middle; .5X3X.1cm, pink and moist.   Pressure Injury POA: Yes Dressing procedure/placement/frequency: Barrier cream to protect and promote healing to groin/buttocks/scrotum.  Foam dressing to protect inner thigh and promote healing.  Pt is on an air mattress to reduce pressure. Left foot and heel without wounds; Prevalon boot in place to reduce pressure.  Right AKA stump with staples intact and well-approximated; dry dressing applied. Please re-consult if further assistance is needed.  Thank-you,  Cammie Mcgeeawn Betzabeth Derringer MSN, RN, CWOCN, [place]AP, CNS [phone]

## 2017-08-02 NOTE — Progress Notes (Signed)
Assessment 66M chronic vent dependence with PEG / Foley in SNF admitted with fever, leukocytosis b/l pulm infiltrates and AoCKD with hyperkalemia.    1. AoCKD3: hemodynamically mediated. 2. Sepsis  3. VAP 4. Atrial fibrillation with RVR 5. Cerebellar atrophy with functional quadriplegia, ventilator dependence, using Foley and enteral nutrition; SNF resident 6. Hypercalcemia with elevated total protein 7. Hyperkalemia 8. 8. Hypernatremia from dehydration  Plan 1. Cont IVF with 1/2NS, dc NS  Subjective: Interval History: Improved metabolic parameters some  Objective: Vital signs in last 24 hours: Temp:  [100 F (37.8 C)-101.8 F (38.8 C)] 101.6 F (38.7 C) (11/27 1155) Pulse Rate:  [34-167] 113 (11/27 0800) Resp:  [16-32] 24 (11/27 0800) BP: (79-142)/(47-102) 79/57 (11/27 0800) SpO2:  [95 %-100 %] 99 % (11/27 0800) FiO2 (%):  [30 %] 30 % (11/27 0813) Weight:  [75.2 kg (165 lb 12.6 oz)-100.2 kg (221 lb)] 75.2 kg (165 lb 12.6 oz) (11/27 0500) Weight change:   Intake/Output from previous day: 11/26 0701 - 11/27 0700 In: 2370.8 [I.V.:370.8; IV Piggyback:2000] Out: 650 [Urine:650] Intake/Output this shift: Total I/O In: 217 [I.V.:217] Out: -   General appearance: minimally responsive GI: distended, soft Extremities: right AKA, LLE no edema  Lab Results: Recent Labs    08/01/17 1730 08/01/17 1939  WBC 17.0*  --   HGB 8.3* 7.5*  HCT 26.9* 22.0*  PLT 154  --    BMET:  Recent Labs    08/01/17 1730 08/01/17 1939  NA 142 146*  K 6.5* 5.6*  CL 106 117*  CO2 23  --   GLUCOSE 129* 123*  BUN 213* >140*  CREATININE 4.74* 4.50*  CALCIUM 11.7*  --    No results for input(s): PTH in the last 72 hours. Iron Studies: No results for input(s): IRON, TIBC, TRANSFERRIN, FERRITIN in the last 72 hours. Studies/Results: Dg Chest 1 View  Result Date: 08/01/2017 CLINICAL DATA:  Fever and altered mental status EXAM: CHEST  1 VIEW COMPARISON:  01/25/2017 FINDINGS: Stable  tracheostomy tube. Stable PICC. Heart is upper normal in size. Hazy bilateral central and basilar airspace disease. No pneumothorax. No pleural effusion. IMPRESSION: Hazy bilateral airspace disease suggesting bilateral pneumonia or pulmonary edema. Electronically Signed   By: Arthur  Hoss M.D.   On: 08/01/2017 18:14   Dg Chest Port 1 View  Result Date: 08/02/2017 CLINICAL DATA:  Respiratory failure. EXAM: PORTABLE CHEST 1 VIEW COMPARISON:  Yesterday at 1741 hour FINDINGS: Tracheostomy tube at thoracic inlet. Right upper extremity PICC tip in the mid SVC. Cardiomegaly is unchanged. Possible developing left pleural effusion and retrocardiac consolidation. Similar-appearing heterogeneous bilateral airspace opacity. No pneumothorax. IMPRESSION: 1. Possible developing left pleural effusion a retrocardiac consolidation. 2. Heterogeneous bilateral airspace opacity appears similar, pulmonary edema or infectious. Electronically Signed   By: Melanie  Ehinger M.D.   On: 08/02/2017 05:02    Scheduled: . chlorhexidine gluconate (MEDLINE KIT)  15 mL Mouth Rinse BID  . heparin subcutaneous  5,000 Units Subcutaneous Q8H  . ipratropium-albuterol  3 mL Nebulization Q6H  . levothyroxine  125 mcg Per Tube QAC breakfast  . mouth rinse  15 mL Mouth Rinse QID  . pantoprazole (PROTONIX) IV  40 mg Intravenous QHS     LOS: 1 day    C  08/02/2017,12:04 PM    

## 2017-08-02 NOTE — Progress Notes (Signed)
Pharmacy Antibiotic Note  Craig Oneal. is a 66 y.o. [facility]2017 with fever, weakness, and AMS since Friday, suspected sepsis. Pharmacy has been consulted for Vancomycin and Cefepime dosing. Patient has chronic trach and PEG to abdomen as well as PICC in R-arm. Patient received Vancomycin 1500mg  x1 and Cefepime 2g IV x1 in the ED.  Functional quadriplegic SCr 4.74 on admit has improved to 3.39. Baseline Scr ~1.5 Pt with history of serratia, enterobacter, morganella, and pseudomonas - sensitive to cefepime.   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 Called [facility] lab to follow up on cultures. No current organisms identified, but lab tech will send fax to [phone]. Continuing Vancomycin and Cefepime for now and will re-evalute once cultures are returned.  Plan: Vancomycin 750mg  IV q24h Cefepime 1g IV q24h Monitor clinic progress, WBC, TMax, renal function, electrolytes F/u BCx, C/S, vanc trough as needed, future de-escalation, & length of therapy   Height: 5\' 11"  (180.3 cm) Weight: 165 lb 12.6 oz (75.2 kg) IBW/kg (Calculated) : 75.3  Temp (24hrs), Avg:101.5 F (38.6 C), Min:100 F (37.8 C), Max:102.5 F (39.2 C)  Recent Labs  Lab 08/01/17 1730 08/01/17 1743 08/01/17 1939 08/02/17 1130  WBC 17.0*  --   --  14.8*  CREATININE 4.74*  --  4.50* 3.39*  LATICACIDVEN  --  1.20 0.82  --     Estimated Creatinine Clearance: 23.4 mL/min (A) (by C-G formula based on SCr of 3.39 mg/dL (H)).    No Known Allergies  Antimicrobials this admission: Vancomycin 11/26 >> Cefepime 11/26>>  Dose adjustments this admission:   Microbiology results: [facility] Cultures: 11/26 Resp Cx: ngtd 11/26 UCx: 50-100K with no organisms identified 11/26 BCx: ngtd 11/26 Urethral exudate Cx: ngtd   Thank you for allowing pharmacy to be a part of this patient's care.  Donnella Biyler Suzanne Garbers, PharmD PGY1  Acute Care Pharmacy Resident Pager: [phone] 08/02/2017 2:06 PM

## 2017-08-02 NOTE — Progress Notes (Addendum)
PULMONARY / CRITICAL CARE MEDICINE   Name: Craig Oneal. MRN: [id number] DOB: 07/06/1951    ADMISSION DATE:  08/01/2017 CONSULTATION DATE:  08/01/2017  REFERRING MD:  Dr. Jeneen Rinks  CHIEF COMPLAINT:  Fever, AMS  HISTORY OF PRESENT ILLNESS:  HPI obtained from medical chart review and from daughter at bedside as patient has acute encephalopathy on mechanical ventilator.   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 Blood cultures sent and started on cefepime 11/26 and amikacin 11/27.  Additionally, daughter reports patient normally requires ventilator at night normally but has required continously for the last two days.    Patient has been at [facility] since February 2018; prior to was at [facility].  Hospitalized at [facility] in 01/2017 with proteus bacteremia, septic and hemorrhagic shock.  Additionally has history of serratia and pseudomonas from tracheal aspirate in July, both sensitive to Cefepime, as well as enterobacter and morganella.  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    In ER, initial labs noted for K 6.5, BUN 213, sCr 4.74 (   ), WBC 17, Hgb 8.3, BNP 316, lactic acid 1.2 -> 0.82,  EKG with afib rate 132, CXR with bilateral infiltrates versus edema.  Normotensive in ER.  Treated with sepsis protocol with 2L NS bolus, vancomycin and cefepime given after blood cultures, and lopressor once for HR control.  PCCM to admit patient.     PAST MEDICAL HISTORY :  He  has a past medical history of A-fib ([place]), AKA (above knee amputation), right ([place]), and Tracheostomy in place [facility]).  PAST SURGICAL HISTORY: He  has no past surgical history on  file.  No Known Allergies  No current facility-administered medications on file prior to encounter.    Current Outpatient Medications on File Prior to Encounter  Medication Sig  . acetaminophen (TYLENOL) 325 MG tablet Place 650 mg into feeding tube every 6 (six) hours as needed for mild pain (temp greater than than 100.6).   Marland Kitchen aluminum-magnesium hydroxide 200-200 MG/5ML suspension Place 30 mLs into feeding tube every 6 (six) hours as needed (gas).   Marland Kitchen amikacin (AMIKIN) IVPB Inject 100 mg into the vein daily at 6 (six) AM. Order date 08/02/17 - 14 day course for UTI  . Amino Acids-Protein Hydrolys (FEEDING SUPPLEMENT, PRO-STAT SUGAR FREE 64,) LIQD Take 30 mLs by mouth 2 (two) times daily.  Marland Kitchen atorvastatin (LIPITOR) 10 MG tablet Place 10 mg into feeding tube at bedtime.   . baclofen (LIORESAL) 10 MG tablet Place 0.5 tablets (5 mg total) into feeding tube 2 (two) times daily.  . Cefepime HCl 2 GM/100ML SOLN Inject 2 g into the vein 2 (two) times daily. Order date 08/01/17: 14 day course for UTI  . chlorhexidine (PERIDEX) 0.12 % solution Use as directed 15 mLs in the mouth or throat 2 (two) times daily.   . clonazePAM (KLONOPIN) 0.5 MG tablet Place 0.25 mg into feeding tube at bedtime.   . Darbepoetin Alfa 25 MCG/ML SOLN Inject 25 mcg as directed every 28 (twenty-eight) days.   Marland Kitchen docusate sodium (COLACE) 100 MG capsule 100 mg 2 (two) times daily.  . ferrous sulfate 300 (60 Fe) MG/5ML syrup Place 5 mLs (300  mg total) into feeding tube daily with breakfast. (Patient taking differently: Place 300 mg into feeding tube 2 (two) times daily with a meal. )  . fluticasone (FLONASE) 50 MCG/ACT nasal spray Place 2 sprays into both nostrils daily.   . furosemide (LASIX) 20 MG tablet Place 20 mg into feeding tube every other day.  . heparin 5000 UNIT/ML injection Inject 5,000 Units into the skin every 12 (twelve) hours. For prophylactic  . ipratropium-albuterol (DUONEB) 0.5-2.5 (3) MG/3ML SOLN Take 3 mLs by  nebulization every 2 (two) hours as needed. (Patient taking differently: Take 3 mLs by nebulization every 6 (six) hours. )  . lactulose (CHRONULAC) 10 GM/15ML solution Place 20 g into feeding tube every 12 (twelve) hours. Hold for regular BMs X 3 days  . lansoprazole (PREVACID) 30 MG capsule Place 30 mg into feeding tube daily before breakfast.  . levothyroxine (SYNTHROID, LEVOTHROID) 125 MCG tablet Place 125 mcg into feeding tube daily before breakfast.  . loratadine (CLARITIN) 10 MG tablet Place 10 mg into feeding tube daily.   . metoprolol tartrate (LOPRESSOR) 25 MG tablet Place 0.5 tablets (12.5 mg total) into feeding tube 2 (two) times daily.  . montelukast (SINGULAIR) 10 MG tablet Place 10 mg into feeding tube at bedtime.   . Multiple Vitamin (MULTIVITAMIN WITH MINERALS) TABS tablet Place 1 tablet into feeding tube daily.   . Nutritional Supplements (NEPRO) LIQD Place 1,200 mLs into feeding tube See admin instructions. 60 m./hr via pump per G-tube - start time 10am, run until 0600 or until total volume of 1200 ml is infused  . oxyCODONE (OXY IR/ROXICODONE) 5 MG immediate release tablet Take 5-10 mg by mouth every 6 (six) hours as needed (5 mg for moderate pain, 10 mg for severe pain).  . polyethylene glycol (MIRALAX / GLYCOLAX) packet Place 17 g into feeding tube every 12 (twelve) hours.   . potassium chloride (KLOR-CON) 20 MEQ packet Place 20 mEq into feeding tube every 8 (eight) hours.  . sodium phosphate (FLEET) 7-19 GM/118ML ENEM Place 1 enema rectally daily as needed for severe constipation (constipation).  . STERILE WATER PO Place 200 mLs into feeding tube every 4 (four) hours.  . Vitamin D, Ergocalciferol, (DRISDOL) 50000 units CAPS capsule Place 50,000 Units into feeding tube every Friday.   . Nutritional Supplements (FEEDING SUPPLEMENT, VITAL HIGH PROTEIN,) LIQD liquid Place 1,000 mLs into feeding tube daily. (Patient not taking: Reported on 08/01/2017)    FAMILY HISTORY:  His has  no family status information on file.    SOCIAL HISTORY: Not on file.  REVIEW OF SYSTEMS:   Unable to obtain due to acute encephalopathy/ VDRF  SUBJECTIVE:  Started on cardizem drip overnight  VITAL SIGNS: BP (!) 79/57 (BP Location: Left Arm)   Pulse (!) 113   Temp 100.2 F (37.9 C) (Oral)   Resp (!) 24   Ht 5\' 11"  (1.803 m)   Wt 165 lb 12.6 oz (75.2 kg)   SpO2 99%   BMI 23.12 kg/m   HEMODYNAMICS: CVP:  [6 mmHg-14 mmHg] 14 mmHg  VENTILATOR SETTINGS: Vent Mode: CPAP;PSV FiO2 (%):  [30 %] 30 % Set Rate:  [14 bmp-24 bmp] 14 bmp Vt Set:  [500 mL] 500 mL PEEP:  [5 cmH20] 5 cmH20 Pressure Support:  [15 cmH20] 15 cmH20 Plateau Pressure:  [18 cmH20-22 cmH20] 18 cmH20  INTAKE / OUTPUT: I/O last 3 completed shifts: In: 2370.8 [I.V.:370.8; IV Piggyback:2000] Out: 650 [Urine:650]  PHYSICAL EXAMINATION: Gen:      No  acute distress,  HEENT:  EOMI, sclera anicteric, trach site clean, dry. Neck:     No masses; no thyromegaly Lungs:    Clear to auscultation bilaterally; normal respiratory effort CV:         Irregular; no murmurs Abd:      + bowel sounds; soft, non-tender; no palpable masses, no distension Ext:    Rt AKA Skin:      Warm and dry; no rash Neuro: Somnolent, opens eyes to command  LABS:  BMET Recent Labs  Lab 08/01/17 1730 08/01/17 1939  NA 142 146*  K 6.5* 5.6*  CL 106 117*  CO2 23  --   BUN 213* >140*  CREATININE 4.74* 4.50*  GLUCOSE 129* 123*    Electrolytes Recent Labs  Lab 08/01/17 1730 08/01/17 2146  CALCIUM 11.7*  --   MG  --  3.1*  PHOS  --  4.1    CBC Recent Labs  Lab 08/01/17 1730 08/01/17 1939  WBC 17.0*  --   HGB 8.3* 7.5*  HCT 26.9* 22.0*  PLT 154  --     Coag's Recent Labs  Lab 08/01/17 2146  APTT 21*  INR 1.18    Sepsis Markers Recent Labs  Lab 08/01/17 1743 08/01/17 1939  LATICACIDVEN 1.20 0.82    ABG Recent Labs  Lab 08/01/17 2144  PHART 7.386  PCO2ART 36.8  PO2ART 84.0    Liver  Enzymes Recent Labs  Lab 08/01/17 1730  AST 55*  ALT 71*  ALKPHOS 229*  BILITOT 0.6  ALBUMIN 2.3*    Cardiac Enzymes No results for input(s): TROPONINI, PROBNP in the last 168 hours.  Glucose Recent Labs  Lab 08/02/17 0017 08/02/17 0835  GLUCAP 95 83    Imaging Dg Chest 1 View  Result Date: 08/01/2017 CLINICAL DATA:  Fever and altered mental status EXAM: CHEST  1 VIEW COMPARISON:  01/25/2017 FINDINGS: Stable tracheostomy tube. Stable PICC. Heart is upper normal in size. Hazy bilateral central and basilar airspace disease. No pneumothorax. No pleural effusion. IMPRESSION: Hazy bilateral airspace disease suggesting bilateral pneumonia or pulmonary edema. Electronically Signed   By: Jolaine Click M.D.   On: 08/01/2017 18:14   Dg Chest Port 1 View  Result Date: 08/02/2017 CLINICAL DATA:  Respiratory failure. EXAM: PORTABLE CHEST 1 VIEW COMPARISON:  Yesterday at 1741 hour FINDINGS: Tracheostomy tube at thoracic inlet. Right upper extremity PICC tip in the mid SVC. Cardiomegaly is unchanged. Possible developing left pleural effusion and retrocardiac consolidation. Similar-appearing heterogeneous bilateral airspace opacity. No pneumothorax. IMPRESSION: 1. Possible developing left pleural effusion a retrocardiac consolidation. 2. Heterogeneous bilateral airspace opacity appears similar, pulmonary edema or infectious. Electronically Signed   By: Rubye Oaks M.D.   On: 08/02/2017 05:02   STUDIES:  11/26 CXR >> bilateral infiltrates vs edema 11/27 CXR >> stable bilateral opacities  TTE >> pending  CULTURES: Prior cultures at [facility] 11/23 ? 11/26 BCx 2 >> 11/26 trach aspirate >> 11/26 UC >>  ANTIBIOTICS: ? Prior Amikacin >> 11/26 Cefepime (at [facility]) >> 11/26 Vancomycin >>  SIGNIFICANT EVENTS: 11/26 Admit  LINES/TUBES: R PICC from [facility] -  Gastrostomy tube Foley>> changed 11/26 >>  DISCUSSION: 58 yoM presenting from [facility] with AMS, fever, and acute on chronic  respiratory failure since 11/23 admitted for sepsis and AKI.  ASSESSMENT / PLAN:  PULMONARY A: Acute on chronic respiratory failure  Likely HCAP +/- pulmonary edema  - PRVC rate 24, Vt 500, FiO2 30%, PEEP 5, patient previously only requiring HS vent  support P:   Start pressure support weans as tolerated. Follow chest x-ray. Duo nebs as needed Trach care per protocol  CARDIOVASCULAR A:  Afib (does not appear to be on chronic coagulation) Hx HFrEF- prior TTE 08/2016 45-50% - BNP 316 P:  Continue telemetry monitoring Cardizem drip  Follow echocardiogram.  RENAL A:   AKI on CKD3- still making urine Hyperkalemia Chronic foley P:   Renal on board, not a candidate for dialysis Follow urine output and cr Follow BMP  GASTROINTESTINAL A:   NPO Gtube Transaminitis - appears chronic based on May 2018 labs P:   Start tube feeds PPI for SUP Follow LFTs  HEMATOLOGIC A:   Anemia Repeat Hb is 5.9. No obvious sign of bleed P:  Tranfuse 2 unit prbc Follow CBC  INFECTIOUS A:   Sepsis- fever, WBC 17- source likely HCAP +/- UTI, r/o osteo w/sacral decub, bacteremia, R AKA site appears WNL, chronic foley/ PICC Hx of pseudomonas and serratia- trach asp in June 2018 sensitive to cefepime ? CRE per [facility] P:   Follow cultures Continue vanco, cefepime Get cultures results from [facility] Urine strep and legionella pending  ENDOCRINE A:   Hypothyroidism  P:   CBG q 4 On synthyroid  NEUROLOGIC A:   Acute encephalopathy- ddx infection/sepsis vs uremic  Hx cerebral atrophy, paraplegia w/ataxia P:   Frequent Neuro checks  Holding sedation  See ID  FAMILY  - Updates: Daughter update at bedside 11/27 - Inter-disciplinary family meet or Palliative Care meeting due by: 08/08/2017  The patient is critically ill with multiple organ system failure and requires high complexity decision making for assessment and support, frequent evaluation and titration of therapies, advanced  monitoring, review of radiographic studies and interpretation of complex data.   Critical Care Time devoted to patient care services, exclusive of separately billable procedures, described in this note is 35 minutes.   Chilton GreathousePraveen Thomasine Klutts MD [facility] Pager [phone] If no answer or after 3pm call: [phone] 08/02/2017, 11:20 AM

## 2017-08-02 NOTE — Progress Notes (Signed)
  Echocardiogram 2D Echocardiogram has been performed.  Craig SkeenVijay  Marice Oneal 08/02/2017, 9:51 AM

## 2017-08-02 NOTE — Progress Notes (Signed)
SBP decreased to 68 Dr Isaiah SergeMannam informed orders received for levophed infusion.

## 2017-08-02 NOTE — Progress Notes (Signed)
Patient transported from 2M12 to CT and back without any complications.

## 2017-08-02 NOTE — Progress Notes (Signed)
eLink Physician-Brief Progress Note Patient Name: Craig DiegoCharlie J Clum Jr. DOB: Feb 14, 1951 MRN: [id number]   Date of Service  08/02/2017  HPI/Events of Note  Patient admitted with A. fib with RVR. Systolic blood pressure 98. Has received 3.5 L IV fluid. FiO2 0.3.   eICU Interventions  1. Ordering additional 2 L IV fluid bolus of normal saline 2. Continuous telemetry monitoring 3. Diltiazem drip ordered      Intervention Category Major Interventions: Arrhythmia - evaluation and management  Lawanda CousinsJennings Ronnie Mallette 08/02/2017, 2:24 AM

## 2017-08-02 NOTE — Consult Note (Signed)
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 paraplegic male with pmhx of tracheostomy (vent dependent QHS), PEG, chronic foley catheter from [facility] with fevers, leukocytosis with concern for HCAP. 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 Uncertain if PNA vs abdominal source.   Plan: 1. Change cefepime to avycaz with h/o CRE.  2. Will stop Vancomycin and change to linezolid to limit nephrotoxic agents while we await culture information.  3. Check Chest CT to follow up on CXR findings 4. Check Abdominal CT to work up PEG drainage  5. Please remove every other staple from R AKA --Thank you!   Active Problems:   Acute encephalopathy   . chlorhexidine gluconate (MEDLINE KIT)  15 mL Mouth Rinse BID  . heparin subcutaneous  5,000 Units Subcutaneous Q8H  . ipratropium-albuterol  3 mL Nebulization Q6H  . levothyroxine  125 mcg Per Tube QAC breakfast  . mouth rinse  15 mL Mouth Rinse QID  . pantoprazole (PROTONIX) IV  40 mg Intravenous QHS    HPI: Craig Oneal. is a 66 y.o. male admitted on 08/01/2017 with AMS, AKI, AFib with RVR and fever from [facility]. Presented normotensive with creatinine 4.74 (previous baseline 1.5 - 2), hyperkalemic K 6.5, transaminitis, WBC 17 (elevated neuts). U/A with large hemoglobin, moderate leukocytes, few bacteria, numerous WBCs and RBCs, budding yeast/hyphae present.   Previously Mr. 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 AKA performed October 2018 after . Has chronic sacral decubitus ulcer.   Per chart review (patient is on ventilator currently) he has  been experiencing fevers since Friday. Drainage from PEG tube started Sunday (per [facility] RN). Blood cultures were obtained and started empirically on cefepime and vancomycin. Normally he is ventilator dependent at night only but he had required continuous ventilatory support 48h prior to admission. History of serratia and pseudomonas from tracheal aspirate in July as well as enterobacter and morganella. 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 Right PICC line in place that was placed at [facility] PTA.   Today he has had fevers 102.5 deg and has been started on levophed for hypotension, cardizem gtt for rate control with AFib. His daughter is present and reports that she is concerned about his mental status --> has had little interaction over the last few days with her.   Review of Systems: Review of Systems  Unable to perform ROS: Acuity of condition    Past Medical History:  Diagnosis Date  . A-fib ([place])   . Hx of AKA (above knee amputation), right ([place])   . Tracheostomy in place [facility])     Social History   Tobacco Use  . Smoking status: Not on file  Substance Use Topics  . Alcohol use: Not on file  . Drug use: Not on file    No family history on file. No Known Allergies  OBJECTIVE: Blood pressure (!) 82/64, pulse 95, temperature (!) 100.5 F (38.1 C), temperature source Oral, resp. rate (!) 21,  height _0  (1.803 m), weight 165 lb 12.6 oz (75.2 kg), SpO2 98 %.  Physical Exam  Constitutional:  Chronically ill-appearing elderly AA male. Ventilator dependent.   HENT:  Mouth/Throat: Oropharynx is clear and moist.  Eyes: Pupils are equal, round, and reactive to light. No scleral icterus.  Cardiovascular: S1 normal and S2 normal. An irregular rhythm present. Tachycardia present.  No murmur heard. Pulmonary/Chest: Effort normal.  Coarse BS throughout   Abdominal: Soft. He exhibits  distension.  Tan bloody drainage from PEG site which is new.   Musculoskeletal:  R AKA - wound approximated and well healed. Staples remain.   Lymphadenopathy:    He has no cervical adenopathy.  Neurological:  Opens eyes with name. Does not follow commands.   Skin: Skin is warm and dry.    Lab Results Lab Results  Component Value Date   WBC 14.8 (H) 08/02/2017   HGB 5.9 (LL) 08/02/2017   HCT 20.8 (L) 08/02/2017   MCV 78.5 08/02/2017   PLT 129 (L) 08/02/2017    Lab Results  Component Value Date   CREATININE 3.39 (H) 08/02/2017   BUN 156 (H) 08/02/2017   NA 147 (H) 08/02/2017   K 4.3 08/02/2017   CL 122 (H) 08/02/2017   CO2 19 (L) 08/02/2017    Lab Results  Component Value Date   ALT 43 08/02/2017   AST 39 08/02/2017   ALKPHOS 153 (H) 08/02/2017   BILITOT 0.5 08/02/2017    Lab Results  Component Value Date   ESRSEDRATE >140 (H) 08/01/2017    Microbiology: Tracheal Asp 11/26 >> Rare GPC, Moderate GNR on GS BCx 11/26 >> 2/2 NG x 24h Urine Cx 11/26 >> pending  Radiology:  CXR 08/02/17 IMPRESSION: 1. Possible developing left pleural effusion a retrocardiac consolidation. 2. Heterogeneous bilateral airspace opacity appears similar, pulmonary edema or infectious.  Janene Madeira, MSN, NP-C [facility] [place] Cell: [phone] Pager: [phone]  08/02/2017 3:15 PM

## 2017-08-02 NOTE — Progress Notes (Signed)
RT Note:  Sputum sent to lab.

## 2017-08-03 ENCOUNTER — Inpatient Hospital Stay (HOSPITAL_COMMUNITY): Payer: Medicare Other

## 2017-08-03 ENCOUNTER — Encounter (HOSPITAL_COMMUNITY): Payer: Self-pay | Admitting: Interventional Radiology

## 2017-08-03 DIAGNOSIS — J9600 Acute respiratory failure, unspecified whether with hypoxia or hypercapnia: Secondary | ICD-10-CM

## 2017-08-03 HISTORY — PX: IR REPLC GASTRO/COLONIC TUBE PERCUT W/FLUORO: IMG2333

## 2017-08-03 LAB — URINE CULTURE

## 2017-08-03 LAB — CBC
HCT: 27 % — ABNORMAL LOW (ref 39.0–52.0)
Hemoglobin: 8 g/dL — ABNORMAL LOW (ref 13.0–17.0)
MCH: 23.5 pg — ABNORMAL LOW (ref 26.0–34.0)
MCHC: 29.6 g/dL — ABNORMAL LOW (ref 30.0–36.0)
MCV: 79.2 fL (ref 78.0–100.0)
PLATELETS: 159 10*3/uL (ref 150–400)
RBC: 3.41 MIL/uL — AB (ref 4.22–5.81)
RDW: 16.4 % — ABNORMAL HIGH (ref 11.5–15.5)
WBC: 15.2 10*3/uL — AB (ref 4.0–10.5)

## 2017-08-03 LAB — BPAM RBC
Blood Product Expiration Date: 201812042359
Blood Product Expiration Date: 201812042359
ISSUE DATE / TIME: 201811271235
ISSUE DATE / TIME: 201811271426
UNIT TYPE AND RH: 5100
Unit Type and Rh: 5100

## 2017-08-03 LAB — BLOOD CULTURE ID PANEL (REFLEXED)
ACINETOBACTER BAUMANNII: NOT DETECTED
CANDIDA KRUSEI: NOT DETECTED
CANDIDA PARAPSILOSIS: NOT DETECTED
Candida albicans: NOT DETECTED
Candida glabrata: NOT DETECTED
Candida tropicalis: NOT DETECTED
ESCHERICHIA COLI: NOT DETECTED
Enterobacter cloacae complex: NOT DETECTED
Enterobacteriaceae species: NOT DETECTED
Enterococcus species: NOT DETECTED
Haemophilus influenzae: NOT DETECTED
KLEBSIELLA OXYTOCA: NOT DETECTED
Klebsiella pneumoniae: NOT DETECTED
Listeria monocytogenes: NOT DETECTED
Methicillin resistance: DETECTED — AB
Neisseria meningitidis: NOT DETECTED
PROTEUS SPECIES: NOT DETECTED
PSEUDOMONAS AERUGINOSA: NOT DETECTED
SERRATIA MARCESCENS: NOT DETECTED
STAPHYLOCOCCUS AUREUS BCID: NOT DETECTED
STREPTOCOCCUS PNEUMONIAE: NOT DETECTED
Staphylococcus species: DETECTED — AB
Streptococcus agalactiae: NOT DETECTED
Streptococcus pyogenes: NOT DETECTED
Streptococcus species: NOT DETECTED

## 2017-08-03 LAB — GLUCOSE, CAPILLARY
GLUCOSE-CAPILLARY: 108 mg/dL — AB (ref 65–99)
GLUCOSE-CAPILLARY: 119 mg/dL — AB (ref 65–99)
GLUCOSE-CAPILLARY: 125 mg/dL — AB (ref 65–99)
GLUCOSE-CAPILLARY: 94 mg/dL (ref 65–99)
GLUCOSE-CAPILLARY: 97 mg/dL (ref 65–99)
Glucose-Capillary: 100 mg/dL — ABNORMAL HIGH (ref 65–99)

## 2017-08-03 LAB — TYPE AND SCREEN
ABO/RH(D): O POS
ANTIBODY SCREEN: NEGATIVE
UNIT DIVISION: 0
Unit division: 0

## 2017-08-03 LAB — RENAL FUNCTION PANEL
Albumin: 1.6 g/dL — ABNORMAL LOW (ref 3.5–5.0)
Anion gap: 7 (ref 5–15)
BUN: 102 mg/dL — AB (ref 6–20)
CHLORIDE: 116 mmol/L — AB (ref 101–111)
CO2: 20 mmol/L — AB (ref 22–32)
CREATININE: 2.41 mg/dL — AB (ref 0.61–1.24)
Calcium: 8.8 mg/dL — ABNORMAL LOW (ref 8.9–10.3)
GFR calc Af Amer: 31 mL/min — ABNORMAL LOW (ref >60)
GFR calc non Af Amer: 27 mL/min — ABNORMAL LOW (ref >60)
GLUCOSE: 127 mg/dL — AB (ref 65–99)
Phosphorus: 3.9 mg/dL (ref 2.5–4.6)
Potassium: 3.6 mmol/L (ref 3.5–5.1)
Sodium: 143 mmol/L (ref 135–145)

## 2017-08-03 LAB — PHOSPHORUS
PHOSPHORUS: 4 mg/dL (ref 2.5–4.6)
Phosphorus: 3.6 mg/dL (ref 2.5–4.6)
Phosphorus: 3.7 mg/dL (ref 2.5–4.6)

## 2017-08-03 LAB — MAGNESIUM
MAGNESIUM: 2.1 mg/dL (ref 1.7–2.4)
MAGNESIUM: 1.9 mg/dL (ref 1.7–2.4)
Magnesium: 1.9 mg/dL (ref 1.7–2.4)

## 2017-08-03 LAB — LEGIONELLA PNEUMOPHILA SEROGP 1 UR AG: L. pneumophila Serogp 1 Ur Ag: NEGATIVE

## 2017-08-03 MED ORDER — LIDOCAINE VISCOUS 2 % MT SOLN
OROMUCOSAL | Status: AC
  Filled 2017-08-03: qty 15

## 2017-08-03 MED ORDER — LIDOCAINE VISCOUS 2 % MT SOLN
OROMUCOSAL | Status: DC | PRN
  Administered 2017-08-03: 5 mL via OROMUCOSAL

## 2017-08-03 MED ORDER — IOPAMIDOL (ISOVUE-300) INJECTION 61%
INTRAVENOUS | Status: AC
  Administered 2017-08-03: 20 mL via GASTROSTOMY
  Filled 2017-08-03: qty 50

## 2017-08-03 MED ORDER — DILTIAZEM 12 MG/ML ORAL SUSPENSION
30.0000 mg | Freq: Four times a day (QID) | ORAL | Status: DC
  Administered 2017-08-03 – 2017-08-08 (×19): 30 mg
  Filled 2017-08-03 (×21): qty 3

## 2017-08-03 MED ORDER — NOREPINEPHRINE BITARTRATE 1 MG/ML IV SOLN
0.0000 ug/min | INTRAVENOUS | Status: DC
  Administered 2017-08-03: 2 ug/min via INTRAVENOUS
  Filled 2017-08-03 (×2): qty 4

## 2017-08-03 MED ORDER — VITAL AF 1.2 CAL PO LIQD
1000.0000 mL | ORAL | Status: DC
  Administered 2017-08-03 – 2017-08-06 (×5): 1000 mL

## 2017-08-03 MED ORDER — PANTOPRAZOLE SODIUM 40 MG PO PACK
40.0000 mg | PACK | Freq: Every day | ORAL | Status: DC
  Administered 2017-08-03 – 2017-08-19 (×17): 40 mg
  Filled 2017-08-03 (×18): qty 20

## 2017-08-03 MED ORDER — DEXTROSE 5 % IV SOLN
INTRAVENOUS | Status: DC
Start: 2017-08-03 — End: 2017-08-04
  Administered 2017-08-03: 13:00:00 via INTRAVENOUS

## 2017-08-03 NOTE — Progress Notes (Signed)
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 Bcx 11/26 with methicillin-resistant coag negative staph in 1/2 bottles.   Interval History: Remains encephalopathic, on mechanical ventilation. Did not follow commands for me though did open eyes on command with PCCM. Temp 101.3 ON. WBC 15.2.   CXR this AM with interval increased BL pulmonary interstitial opacities and streaky medial lung base opacity  CT chest/abd/pelv yesterday with bilateral hydronephrosis. Left > right LL PNA. No intraabdominal abscess appreciated. Hemorrhage appreciated in right renal collecting system.  Physical Exam: Constitutional: Chronically-ill appearing AA male. Encephalopathic. On vent.  Vitals:   08/03/17 0831 08/03/17 0900  BP:  (!) 111/53  Pulse:  72  Resp:  (!) 32  Temp: 99.3 F (37.4 C)   SpO2:  99%  Respiratory: Normal respiratory effort; CTA B. Trach site clean. Cardiovascular: IR IR. Not tachycardic. No Murmur appreciated GI: soft, nd. +bs GU: Frank hematuria in foley   Lab Results  Component Value Date   WBC 15.2 (H) 08/03/2017   HGB 8.0 (L) 08/03/2017   HCT 27.0 (L) 08/03/2017   MCV 79.2 08/03/2017   PLT 159 08/03/2017    Lab Results  Component Value Date   CREATININE 3.39 (H) 08/02/2017   BUN 156 (H) 08/02/2017   NA 147 (H) 08/02/2017   K 4.3 08/02/2017   CL 122 (H) 08/02/2017   CO2 19 (L) 08/02/2017    Lab Results  Component Value Date   ALT 43 08/02/2017   AST 39 08/02/2017   ALKPHOS 153 (H) 08/02/2017    Microbiology: Recent Results (from the past 240 hour(s))  Blood Culture (routine x 2)     Status: None (Preliminary result)    Collection Time: 08/01/17  5:22 PM  Result Value Ref Range Status   Specimen Description BLOOD LEFT HAND  Final   Special Requests   Final    Blood Culture adequate volume BOTTLES DRAWN AEROBIC AND ANAEROBIC   Culture  Setup Time   Final    GRAM POSITIVE COCCI IN CLUSTERS ANAEROBIC BOTTLE ONLY CRITICAL RESULT CALLED TO, READ BACK BY AND VERIFIED WITH: PHARMD G ABBOTT [phone] MLM    Culture GRAM POSITIVE COCCI  Final   Report Status PENDING  Incomplete  Blood Culture ID Panel (Reflexed)     Status: Abnormal   Collection Time: 08/01/17  5:22 PM  Result Value Ref Range Status   Enterococcus species NOT DETECTED NOT DETECTED Final   Listeria monocytogenes NOT DETECTED NOT DETECTED Final   Staphylococcus species DETECTED (A) NOT DETECTED Final    Comment: Methicillin (oxacillin) resistant coagulase negative staphylococcus. Possible blood culture contaminant (unless isolated from more than one blood culture draw or clinical case suggests pathogenicity). No antibiotic treatment is indicated for blood  culture contaminants. CRITICAL RESULT CALLED TO, READ BACK BY AND VERIFIED WITH: PHARMD G ABBOTT [phone] MLM    Staphylococcus aureus NOT DETECTED NOT DETECTED Final   Methicillin resistance DETECTED (A) NOT DETECTED Final    Comment: CRITICAL RESULT CALLED TO, READ BACK BY AND VERIFIED WITH: PHARMD G ABBOTT [phone] MLM    Streptococcus species NOT DETECTED NOT DETECTED Final   Streptococcus agalactiae NOT DETECTED NOT  DETECTED Final   Streptococcus pneumoniae NOT DETECTED NOT DETECTED Final   Streptococcus pyogenes NOT DETECTED NOT DETECTED Final   Acinetobacter baumannii NOT DETECTED NOT DETECTED Final   Enterobacteriaceae species NOT DETECTED NOT DETECTED Final   Enterobacter cloacae complex NOT DETECTED NOT DETECTED Final   Escherichia coli NOT DETECTED NOT DETECTED Final   Klebsiella oxytoca NOT DETECTED NOT DETECTED Final   Klebsiella pneumoniae NOT DETECTED NOT  DETECTED Final   Proteus species NOT DETECTED NOT DETECTED Final   Serratia marcescens NOT DETECTED NOT DETECTED Final   Haemophilus influenzae NOT DETECTED NOT DETECTED Final   Neisseria meningitidis NOT DETECTED NOT DETECTED Final   Pseudomonas aeruginosa NOT DETECTED NOT DETECTED Final   Candida albicans NOT DETECTED NOT DETECTED Final   Candida glabrata NOT DETECTED NOT DETECTED Final   Candida krusei NOT DETECTED NOT DETECTED Final   Candida parapsilosis NOT DETECTED NOT DETECTED Final   Candida tropicalis NOT DETECTED NOT DETECTED Final  Blood Culture (routine x 2)     Status: None (Preliminary result)   Collection Time: 08/01/17  7:05 PM  Result Value Ref Range Status   Specimen Description BLOOD RIGHT PICC LINE  Final   Special Requests IN PEDIATRIC BOTTLE Blood Culture adequate volume  Final   Culture NO GROWTH < 24 HOURS  Final   Report Status PENDING  Incomplete  Culture, respiratory (tracheal aspirate)     Status: None (Preliminary result)   Collection Time: 08/01/17 11:56 PM  Result Value Ref Range Status   Specimen Description TRACHEAL ASPIRATE  Final   Special Requests NONE  Final   Gram Stain   Final    ABUNDANT WBC PRESENT,BOTH PMN AND MONONUCLEAR MODERATE GRAM NEGATIVE RODS RARE GRAM POSITIVE RODS    Culture PENDING  Incomplete   Report Status PENDING  Incomplete  MRSA PCR Screening     Status: None   Collection Time: 08/02/17  1:05 AM  Result Value Ref Range Status   MRSA by PCR NEGATIVE NEGATIVE Final    Comment:        The GeneXpert MRSA Assay (FDA approved for NASAL specimens only), is one component of a comprehensive MRSA colonization surveillance program. It is not intended to diagnose MRSA infection nor to guide or monitor treatment for MRSA infections.    Impression/Plan: 66 y/o paraplegic male hospitalized since February [facility](LTACH) with Hx of tracheostomy (vent dependent QHS), PEG, chronic foley here from [facility] with fevers, leukocytosis,  worsening respiratory status and concern for HCAP. Bcx 11/26 with methicillin-resistant coag negative staph in 1/2 bottles.   1. Acute on Chronic Respiratory Failure, HCAP: White count stable. Febrile to 101.3 this morning. Antibiotics transitioned to Ceftazadime-avibactam + Linezolid. Trach culture with GNR, GPR. 1/2 Bcx with methicillin coag neg staph, suspect contaminate.  -Repeat Bcx today -Continue Ceftazadime-avibactam & Linezolid  -Continue to follow fever, WBC trends  2. Right Renal Hemorrhage, Hematuria: Red urine in foley bag. Hb stable at 8 today.   3. Sepsis, Encephalopathy: Did not FC for me. Still requiring pressor support with NE.

## 2017-08-03 NOTE — Progress Notes (Addendum)
PHARMACY - PHYSICIAN COMMUNICATION CRITICAL VALUE ALERT - BLOOD CULTURE IDENTIFICATION (BCID)  Craig J Toney [name] is an 66 y.o. male who [facility] with a chief complaint of fever, weakness, AMS and suspected sepsis on admission.  Assessment:  Patient admitted from [facility]. Pt with recent history of multi-drug resistant organisms from previous UTIs and HCAP. Due to clinical worsening and unknown infectious process - recommend continuing current antibiotics.  Potential source is from chronic foley/trach, PEG in abdomen, and PICC in R-arm (include suspected source if known)  Name of physician (or Provider) Contacted: Dr. Isaiah SergeMannam  Current antibiotics: Vancomycin and Cefepime was switched to Avycaz and Linezolid yesterday.  Changes to prescribed antibiotics recommended:  Recommendations accepted by provider  Results for orders placed or performed during the hospital encounter of 08/01/17  Blood Culture ID Panel (Reflexed) (Collected: 08/01/2017  5:22 PM)  Result Value Ref Range   Enterococcus species NOT DETECTED NOT DETECTED   Listeria monocytogenes NOT DETECTED NOT DETECTED   Staphylococcus species DETECTED (A) NOT DETECTED   Staphylococcus aureus NOT DETECTED NOT DETECTED   Methicillin resistance DETECTED (A) NOT DETECTED   Streptococcus species NOT DETECTED NOT DETECTED   Streptococcus agalactiae NOT DETECTED NOT DETECTED   Streptococcus pneumoniae NOT DETECTED NOT DETECTED   Streptococcus pyogenes NOT DETECTED NOT DETECTED   Acinetobacter baumannii NOT DETECTED NOT DETECTED   Enterobacteriaceae species NOT DETECTED NOT DETECTED   Enterobacter cloacae complex NOT DETECTED NOT DETECTED   Escherichia coli NOT DETECTED NOT DETECTED   Klebsiella oxytoca NOT DETECTED NOT DETECTED   Klebsiella pneumoniae NOT DETECTED NOT DETECTED   Proteus species NOT DETECTED NOT DETECTED   Serratia marcescens NOT DETECTED NOT DETECTED   Haemophilus influenzae NOT  DETECTED NOT DETECTED   Neisseria meningitidis NOT DETECTED NOT DETECTED   Pseudomonas aeruginosa NOT DETECTED NOT DETECTED   Candida albicans NOT DETECTED NOT DETECTED   Candida glabrata NOT DETECTED NOT DETECTED   Candida krusei NOT DETECTED NOT DETECTED   Candida parapsilosis NOT DETECTED NOT DETECTED   Candida tropicalis NOT DETECTED NOT DETECTED    Craig Oneal 08/03/2017  10:58 AM

## 2017-08-03 NOTE — Progress Notes (Signed)
Assessment 16M chronic vent dependence with PEG / Foley in SNF admitted with fever, leukocytosis b/l pulm infiltrates and AoCKD with hyperkalemia.   1. AoCKD3:hemodynamically mediated and bilateral hydronephrosis. GU eval pending 2. Sepsis  3. VAP 4. Atrial fibrillation with RVR 5. Cerebellar atrophy with functional quadriplegia, ventilator dependence, using Foley and enteral nutrition; SNF resident 6. Hypercalcemia with elevated total protein 7. Hyperkalemia-resolved 8. Hypernatremia from dehydration---CCM now managing fluids, so will sign off.  Subjective: Interval History: See CT scan  Objective: Vital signs in last 24 hours: Temp:  [97.8 F (36.6 C)-102.7 F (39.3 C)] 98 F (36.7 C) (11/28 1130) Pulse Rate:  [37-119] 80 (11/28 1117) Resp:  [20-33] 33 (11/28 1117) BP: (68-134)/(27-109) 101/50 (11/28 1117) SpO2:  [78 %-100 %] 99 % (11/28 1117) FiO2 (%):  [30 %] 30 % (11/28 1117) Weight:  [80.7 kg (177 lb 14.6 oz)] 80.7 kg (177 lb 14.6 oz) (11/28 0500) Weight change: -19.545 kg (-1.4 oz)  Intake/Output from previous day: 11/27 0701 - 11/28 0700 In: 5264.7 [I.V.:4347; Blood:467.7; IV Piggyback:450] Out: 2750 [Urine:2750] Intake/Output this shift: Total I/O In: 821.2 [I.V.:821.2] Out: 350 [Urine:350]  General appearance: more alert today  Lab Results: Recent Labs    08/02/17 1130 08/02/17 1745 08/03/17 0334  WBC 14.8*  --  15.2*  HGB 5.9* 8.3* 8.0*  HCT 20.8* 28.2* 27.0*  PLT 129*  --  159   BMET:  Recent Labs    08/01/17 1730 08/01/17 1939 08/02/17 1130  NA 142 146* 147*  K 6.5* 5.6* 4.3  CL 106 117* 122*  CO2 23  --  19*  GLUCOSE 129* 123* 96  BUN 213* >140* 156*  CREATININE 4.74* 4.50* 3.39*  CALCIUM 11.7*  --  8.9   No results for input(s): PTH in the last 72 hours. Iron Studies: No results for input(s): IRON, TIBC, TRANSFERRIN, FERRITIN in the last 72 hours. Studies/Results: Ct Abdomen Wo Contrast  Result Date: 08/02/2017 CLINICAL DATA:   Abnormal liver function tests. EXAM: CT CHEST AND ABDOMEN WITHOUT CONTRAST TECHNIQUE: Multidetector CT imaging of the chest and abdomen was performed following the standard protocol without IV contrast. COMPARISON:  Chest x-ray from earlier today FINDINGS: CT CHEST FINDINGS Cardiovascular: Cardiomegaly. No pericardial effusion. Atherosclerotic calcifications seen in the left more than right coronary circulation and along the aorta. Mediastinum/Nodes: Adenopathy in the bilateral mediastinum. Largest nodes are sub- carina and are difficult to distinguish from the esophagus. Where not obscured, the esophagus appears normal. A right subcarinal node measures up to 26 mm short axis and may have mineralization. Right upper extremity PICC with tip at the upper SVC. Lungs/Pleura: Airspace disease in the left more than right lower lobes consistent with pneumonia. Tracheostomy tube in place. No cavitation or effusion. Musculoskeletal: No acute finding. CT ABDOMEN FINDINGS Hepatobiliary: No focal liver abnormality.Layering high-density in the gallbladder. No evidence of acute cholecystitis. No common bile duct dilatation. Pancreas: Unremarkable. Spleen: Unremarkable. Adrenals/Urinary Tract: Negative adrenals. Bilateral hydronephrosis and upper hydroureter to the level of the pelvic inlet. The bladder is not covered on this abdominal CT. There is high-density within the collecting system of the upper pole right kidney favoring hemorrhage over debris/mass. No noted stone. Stomach/Bowel: Percutaneous gastrostomy tube has been retracted into the abdominal wall. There is a visible gas filled track between the catheter tip and the stomach. Oral contrast was injected and did not extravasated into the peritoneum. Oral contrast is seen within nondilated small bowel. Formed stool throughout the visualized colon. Vascular/Lymphatic: Atherosclerotic calcification. No  acute vascular finding. No mass or adenopathy. Other: Trace ascites.  Musculoskeletal: No acute abnormalities. These results were called by telephone at the time of interpretation on 08/02/2017 at 10:03 pm to [facility]. A bladder scan was suggested depending on foley output. IMPRESSION: 1. A percutaneous gastrostomy tube has been retracted into the abdominal wall. There is a visible and presumably mature tract seen from the catheter to the gastric lumen. Oral contrast was injected through the catheter and did not extravasate. No pneumoperitoneum or collection. 2. Advanced bilateral hydronephrosis and upper hydroureter. Please correlate with foley output and possible bladder scan. 3. High-density in the upper right renal collecting system consistent with hemorrhage. Please correlate with UA. 4. Left more than right lower lobe pneumonia. 5. Nonspecific adenopathy with possible mineralization of subcarinal nodes; is there history of sarcoid? Consider chest CT follow-up in 3 months. 6. Cholelithiasis. Electronically Signed   By: Monte Fantasia M.D.   On: 08/02/2017 22:11   Dg Chest 1 View  Result Date: 08/01/2017 CLINICAL DATA:  Fever and altered mental status EXAM: CHEST  1 VIEW COMPARISON:  01/25/2017 FINDINGS: Stable tracheostomy tube. Stable PICC. Heart is upper normal in size. Hazy bilateral central and basilar airspace disease. No pneumothorax. No pleural effusion. IMPRESSION: Hazy bilateral airspace disease suggesting bilateral pneumonia or pulmonary edema. Electronically Signed   By: Marybelle Killings M.D.   On: 08/01/2017 18:14   Ct Chest Wo Contrast  Result Date: 08/02/2017 CLINICAL DATA:  Abnormal liver function tests. EXAM: CT CHEST AND ABDOMEN WITHOUT CONTRAST TECHNIQUE: Multidetector CT imaging of the chest and abdomen was performed following the standard protocol without IV contrast. COMPARISON:  Chest x-ray from earlier today FINDINGS: CT CHEST FINDINGS Cardiovascular: Cardiomegaly. No pericardial effusion. Atherosclerotic calcifications seen in the left more than  right coronary circulation and along the aorta. Mediastinum/Nodes: Adenopathy in the bilateral mediastinum. Largest nodes are sub- carina and are difficult to distinguish from the esophagus. Where not obscured, the esophagus appears normal. A right subcarinal node measures up to 26 mm short axis and may have mineralization. Right upper extremity PICC with tip at the upper SVC. Lungs/Pleura: Airspace disease in the left more than right lower lobes consistent with pneumonia. Tracheostomy tube in place. No cavitation or effusion. Musculoskeletal: No acute finding. CT ABDOMEN FINDINGS Hepatobiliary: No focal liver abnormality.Layering high-density in the gallbladder. No evidence of acute cholecystitis. No common bile duct dilatation. Pancreas: Unremarkable. Spleen: Unremarkable. Adrenals/Urinary Tract: Negative adrenals. Bilateral hydronephrosis and upper hydroureter to the level of the pelvic inlet. The bladder is not covered on this abdominal CT. There is high-density within the collecting system of the upper pole right kidney favoring hemorrhage over debris/mass. No noted stone. Stomach/Bowel: Percutaneous gastrostomy tube has been retracted into the abdominal wall. There is a visible gas filled track between the catheter tip and the stomach. Oral contrast was injected and did not extravasated into the peritoneum. Oral contrast is seen within nondilated small bowel. Formed stool throughout the visualized colon. Vascular/Lymphatic: Atherosclerotic calcification. No acute vascular finding. No mass or adenopathy. Other: Trace ascites. Musculoskeletal: No acute abnormalities. These results were called by telephone at the time of interpretation on 08/02/2017 at 10:03 pm to [facility]. A bladder scan was suggested depending on foley output. IMPRESSION: 1. A percutaneous gastrostomy tube has been retracted into the abdominal wall. There is a visible and presumably mature tract seen from the catheter to the gastric lumen.  Oral contrast was injected through the catheter and did not extravasate. No pneumoperitoneum  or collection. 2. Advanced bilateral hydronephrosis and upper hydroureter. Please correlate with foley output and possible bladder scan. 3. High-density in the upper right renal collecting system consistent with hemorrhage. Please correlate with UA. 4. Left more than right lower lobe pneumonia. 5. Nonspecific adenopathy with possible mineralization of subcarinal nodes; is there history of sarcoid? Consider chest CT follow-up in 3 months. 6. Cholelithiasis. Electronically Signed   By: Monte Fantasia M.D.   On: 08/02/2017 22:11   Dg Chest Port 1 View  Result Date: 08/03/2017 CLINICAL DATA:  66 year old male with acute renal failure, severe hyper code anemia. Acute on chronic respiratory failure. EXAM: PORTABLE CHEST 1 VIEW COMPARISON:  08/02/2017 and earlier. FINDINGS: Portable AP semi upright view at 0423 hours. Stable tracheostomy. Stable right PICC line. Increased bilateral pulmonary interstitial opacity since yesterday, a mildly asymmetrically worse in the left lung. Stable lung volumes. Stable cardiac size and mediastinal contours. No pneumothorax. Increased patchy and streaky bilateral infrahilar opacity. No large pleural effusion. IMPRESSION: 1. Interval increased bilateral pulmonary interstitial opacity and streaky medial lung base opacity. 2. Favor acute interstitial edema with atelectasis rather than acute viral/atypical respiratory infection. Electronically Signed   By: Genevie Ann M.D.   On: 08/03/2017 06:55   Dg Chest Port 1 View  Result Date: 08/02/2017 CLINICAL DATA:  Respiratory failure. EXAM: PORTABLE CHEST 1 VIEW COMPARISON:  Yesterday at 1741 hour FINDINGS: Tracheostomy tube at thoracic inlet. Right upper extremity PICC tip in the mid SVC. Cardiomegaly is unchanged. Possible developing left pleural effusion and retrocardiac consolidation. Similar-appearing heterogeneous bilateral airspace opacity. No  pneumothorax. IMPRESSION: 1. Possible developing left pleural effusion a retrocardiac consolidation. 2. Heterogeneous bilateral airspace opacity appears similar, pulmonary edema or infectious. Electronically Signed   By: Jeb Levering M.D.   On: 08/02/2017 05:02    Scheduled: . chlorhexidine gluconate (MEDLINE KIT)  15 mL Mouth Rinse BID  . diltiazem  30 mg Per Tube Q6H  . heparin subcutaneous  5,000 Units Subcutaneous Q8H  . ipratropium-albuterol  3 mL Nebulization Q6H  . levothyroxine  125 mcg Per Tube QAC breakfast  . mouth rinse  15 mL Mouth Rinse QID  . pantoprazole (PROTONIX) IV  40 mg Intravenous QHS     LOS: 2 days   Estanislado Emms 08/03/2017,12:01 PM

## 2017-08-03 NOTE — Progress Notes (Signed)
PULMONARY / CRITICAL CARE MEDICINE   Name: Craig Oneal. MRN: [id number] DOB: 08-Mar-1951    ADMISSION DATE:  08/01/2017 CONSULTATION DATE:  08/01/2017  REFERRING MD:  Dr. Fayrene Fearing  CHIEF COMPLAINT:  Fever, AMS  HISTORY OF PRESENT ILLNESS:  HPI obtained from medical chart review and from daughter at bedside as patient has acute encephalopathy on mechanical ventilator.   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 Blood cultures sent and started on cefepime 11/26 and amikacin 11/27.  Additionally, daughter reports patient normally requires ventilator at night normally but has required continously for the last two days.    Patient has been at [facility] since February 2018; prior to was at [facility].  Hospitalized at [facility] in 01/2017 with proteus bacteremia, septic and hemorrhagic shock.  Additionally has history of serratia and pseudomonas from tracheal aspirate in July, both sensitive to Cefepime, as well as enterobacter and morganella.  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    In ER, initial labs noted for K 6.5, BUN 213, sCr 4.74 (   ), WBC 17, Hgb 8.3, BNP 316, lactic acid 1.2 -> 0.82,  EKG with afib rate 132, CXR with bilateral infiltrates versus edema.  Normotensive in ER.  Treated with sepsis protocol with 2L NS bolus, vancomycin and cefepime given after blood cultures, and lopressor once for HR control.  PCCM to admit patient.     SUBJECTIVE:  Looks better per family  VITAL SIGNS: BP (!) 111/53   Pulse 72   Temp 99.3 F (37.4 C) (Oral)   Resp (!) 32   Ht 5\' 11"  (1.803 m)   Wt 177 lb 14.6 oz (80.7 kg)   SpO2 99%   BMI 24.81 kg/m     HEMODYNAMICS: CVP:  [10 mmHg-14 mmHg] 11 mmHg  VENTILATOR SETTINGS: Vent Mode: PRVC FiO2 (%):  [30 %] 30 % Set Rate:  [14 bmp] 14 bmp Vt Set:  [500 mL] 500 mL PEEP:  [5 cmH20] 5 cmH20 Plateau Pressure:  [15 cmH20-19 cmH20] 17 cmH20  INTAKE / OUTPUT: I/O last 3 completed shifts: In: 5635.5 [I.V.:4717.8; Blood:467.7; IV Piggyback:450] Out: 3400 [Urine:3400]  PHYSICAL EXAMINATION: General: 66 year old African-American male resting on pressure support ventilation interactive today, family says at baseline. HEENT: Tracheostomy unremarkable, mucous membranes are moist, normocephalic atraumatic. Pulmonary: Scattered rhonchi, acceptable tidal volume on pressure support ventilation Cardiac: Irregular regular without murmur rub or gallop Abdomen: Soft, not tender, positive bowel sounds.  Apparently did have increased gastric residual prior to admission.  PEG tube results noted above IR at bedside. Skin: Abdominal skin fold excoriated and red, sacral ulcers noted Extremities/musculoskeletal: Quadriplegic, contracted, warm and dry Neuro: Awake, currently interactive, nods and smiles.  LABS:  BMET Recent Labs  Lab 08/01/17 1730 08/01/17 1939 08/02/17 1130  NA 142 146* 147*  K 6.5* 5.6* 4.3  CL 106 117* 122*  CO2 23  --  19*  BUN 213* >140* 156*  CREATININE 4.74* 4.50* 3.39*  GLUCOSE 129* 123* 96    Electrolytes Recent Labs  Lab 08/01/17 1730 08/01/17 2146 08/02/17 1130 08/03/17 0334  CALCIUM 11.7*  --  8.9  --   MG  --  3.1*  --  2.1  PHOS  --  4.1  --  4.0    CBC Recent Labs  Lab 08/01/17 1730  08/02/17 1130 08/02/17 1745 08/03/17 0334  WBC 17.0*  --  14.8*  --  15.2*  HGB 8.3*   < > 5.9* 8.3* 8.0*  HCT 26.9*   < > 20.8* 28.2* 27.0*  PLT 154  --  129*  --  159   < > = values in this interval not displayed.    Coag's Recent Labs  Lab 08/01/17 2146  APTT 21*  INR 1.18    Sepsis Markers Recent Labs  Lab 08/01/17 1743 08/01/17 1939 08/02/17 1130   LATICACIDVEN 1.20 0.82  --   PROCALCITON  --   --  7.71    ABG Recent Labs  Lab 08/01/17 2144  PHART 7.386  PCO2ART 36.8  PO2ART 84.0    Liver Enzymes Recent Labs  Lab 08/01/17 1730 08/02/17 1130  AST 55* 39  ALT 71* 43  ALKPHOS 229* 153*  BILITOT 0.6 0.5  ALBUMIN 2.3* 1.6*    Cardiac Enzymes No results for input(s): TROPONINI, PROBNP in the last 168 hours.  Glucose Recent Labs  Lab 08/02/17 0835 08/02/17 1154 08/02/17 1616 08/02/17 2354 08/03/17 0428 08/03/17 0834  GLUCAP 83 84 107* 94 97 100*    Imaging Ct Abdomen Wo Contrast  Result Date: 08/02/2017 CLINICAL DATA:  Abnormal liver function tests. EXAM: CT CHEST AND ABDOMEN WITHOUT CONTRAST TECHNIQUE: Multidetector CT imaging of the chest and abdomen was performed following the standard protocol without IV contrast. COMPARISON:  Chest x-ray from earlier today FINDINGS: CT CHEST FINDINGS Cardiovascular: Cardiomegaly. No pericardial effusion. Atherosclerotic calcifications seen in the left more than right coronary circulation and along the aorta. Mediastinum/Nodes: Adenopathy in the bilateral mediastinum. Largest nodes are sub- carina and are difficult to distinguish from the esophagus. Where not obscured, the esophagus appears normal. A right subcarinal node measures up to 26 mm short axis and may have mineralization. Right upper extremity PICC with tip at the upper SVC. Lungs/Pleura: Airspace disease in the left more than right lower lobes consistent with pneumonia. Tracheostomy tube in place. No cavitation or effusion. Musculoskeletal: No acute finding. CT ABDOMEN FINDINGS Hepatobiliary: No focal liver abnormality.Layering high-density in the gallbladder. No evidence of acute cholecystitis. No common bile duct dilatation. Pancreas: Unremarkable. Spleen: Unremarkable. Adrenals/Urinary Tract: Negative adrenals. Bilateral hydronephrosis and upper hydroureter to the level of the pelvic inlet. The bladder is not covered on  this abdominal CT. There is high-density within the collecting system of the upper pole right kidney favoring hemorrhage over debris/mass. No noted stone. Stomach/Bowel: Percutaneous gastrostomy tube has been retracted into the abdominal wall. There is a visible gas filled track between the catheter tip and the stomach. Oral contrast was injected and did not extravasated into the peritoneum. Oral contrast is seen within nondilated small bowel. Formed stool throughout the visualized colon. Vascular/Lymphatic: Atherosclerotic calcification. No acute vascular finding. No mass or adenopathy. Other: Trace ascites. Musculoskeletal: No acute abnormalities. These results were called by telephone at the time of interpretation on 08/02/2017 at 10:03 pm to [facility]. A bladder scan was suggested depending on foley output. IMPRESSION: 1. A percutaneous gastrostomy tube has been retracted into the abdominal wall. There is a visible and presumably mature tract seen from the catheter to the gastric lumen. Oral contrast was injected through the catheter and did not extravasate. No pneumoperitoneum or collection. 2. Advanced bilateral hydronephrosis and upper hydroureter. Please correlate with foley output and possible bladder  scan. 3. High-density in the upper right renal collecting system consistent with hemorrhage. Please correlate with UA. 4. Left more than right lower lobe pneumonia. 5. Nonspecific adenopathy with possible mineralization of subcarinal nodes; is there history of sarcoid? Consider chest CT follow-up in 3 months. 6. Cholelithiasis. Electronically Signed   By: Marnee Spring M.D.   On: 08/02/2017 22:11   Ct Chest Wo Contrast  Result Date: 08/02/2017 CLINICAL DATA:  Abnormal liver function tests. EXAM: CT CHEST AND ABDOMEN WITHOUT CONTRAST TECHNIQUE: Multidetector CT imaging of the chest and abdomen was performed following the standard protocol without IV contrast. COMPARISON:  Chest x-ray from earlier today  FINDINGS: CT CHEST FINDINGS Cardiovascular: Cardiomegaly. No pericardial effusion. Atherosclerotic calcifications seen in the left more than right coronary circulation and along the aorta. Mediastinum/Nodes: Adenopathy in the bilateral mediastinum. Largest nodes are sub- carina and are difficult to distinguish from the esophagus. Where not obscured, the esophagus appears normal. A right subcarinal node measures up to 26 mm short axis and may have mineralization. Right upper extremity PICC with tip at the upper SVC. Lungs/Pleura: Airspace disease in the left more than right lower lobes consistent with pneumonia. Tracheostomy tube in place. No cavitation or effusion. Musculoskeletal: No acute finding. CT ABDOMEN FINDINGS Hepatobiliary: No focal liver abnormality.Layering high-density in the gallbladder. No evidence of acute cholecystitis. No common bile duct dilatation. Pancreas: Unremarkable. Spleen: Unremarkable. Adrenals/Urinary Tract: Negative adrenals. Bilateral hydronephrosis and upper hydroureter to the level of the pelvic inlet. The bladder is not covered on this abdominal CT. There is high-density within the collecting system of the upper pole right kidney favoring hemorrhage over debris/mass. No noted stone. Stomach/Bowel: Percutaneous gastrostomy tube has been retracted into the abdominal wall. There is a visible gas filled track between the catheter tip and the stomach. Oral contrast was injected and did not extravasated into the peritoneum. Oral contrast is seen within nondilated small bowel. Formed stool throughout the visualized colon. Vascular/Lymphatic: Atherosclerotic calcification. No acute vascular finding. No mass or adenopathy. Other: Trace ascites. Musculoskeletal: No acute abnormalities. These results were called by telephone at the time of interpretation on 08/02/2017 at 10:03 pm to [facility]. A bladder scan was suggested depending on foley output. IMPRESSION: 1. A percutaneous gastrostomy  tube has been retracted into the abdominal wall. There is a visible and presumably mature tract seen from the catheter to the gastric lumen. Oral contrast was injected through the catheter and did not extravasate. No pneumoperitoneum or collection. 2. Advanced bilateral hydronephrosis and upper hydroureter. Please correlate with foley output and possible bladder scan. 3. High-density in the upper right renal collecting system consistent with hemorrhage. Please correlate with UA. 4. Left more than right lower lobe pneumonia. 5. Nonspecific adenopathy with possible mineralization of subcarinal nodes; is there history of sarcoid? Consider chest CT follow-up in 3 months. 6. Cholelithiasis. Electronically Signed   By: Marnee Spring M.D.   On: 08/02/2017 22:11   Dg Chest Port 1 View  Result Date: 08/03/2017 CLINICAL DATA:  66 year old male with acute renal failure, severe hyper code anemia. Acute on chronic respiratory failure. EXAM: PORTABLE CHEST 1 VIEW COMPARISON:  08/02/2017 and earlier. FINDINGS: Portable AP semi upright view at 0423 hours. Stable tracheostomy. Stable right PICC line. Increased bilateral pulmonary interstitial opacity since yesterday, a mildly asymmetrically worse in the left lung. Stable lung volumes. Stable cardiac size and mediastinal contours. No pneumothorax. Increased patchy and streaky bilateral infrahilar opacity. No large pleural effusion. IMPRESSION: 1. Interval increased bilateral pulmonary interstitial  opacity and streaky medial lung base opacity. 2. Favor acute interstitial edema with atelectasis rather than acute viral/atypical respiratory infection. Electronically Signed   By: Odessa FlemingH  Hall M.D.   On: 08/03/2017 06:55   STUDIES:  11/26 CXR >> bilateral infiltrates vs edema 11/27 CXR >> stable bilateral opacities TTE >> obtained 11/27: Mild increased left ventricular hypertrophy.  Systolic function was mildly reduced at 40-45% with global hypokinesis and inferior basal akinesis.   The right ventricle was mildly dilated but systolic function normal.  The right atrium was mildly dilated there is moderate tricuspid valve regurg.  There are no echocardiograms on file to compare CT chest and abdomen obtained.  11/27: Percutaneous gastrostomy tube retracted into the abdominal wall.  Radiologist reports what appears to be mature tract to the gastric lumen given oral contrast injected through catheter did not extravasate.  There is no pneumoperitoneum or fluid collection.  Advanced bilateral hydronephrosis with upper hydroureter.  High density in the right renal collecting system consistent with hemorrhage.  Left greater than right lower lobe airspace disease. CULTURES: Prior cultures at [facility] 11/23 ? 11/26 BCx 2: gpc clusters>>> 11/26 trach aspirate: Abundant WBC, rare gram-positive rods, moderate gram negative rods 11/26 UC >> 11/26: blood culture RID: + MRSA  ANTIBIOTICS: ? Prior Amikacin >>11/27 11/26 Cefepime (at [facility]) >>11/27 11/26 Vancomycin >>11/27 Linezolid 11/27>>> Ceftaz/avibactam 11/27>>>  SIGNIFICANT EVENTS: 11/26 Admit  LINES/TUBES: R PICC from [facility] -  Gastrostomy tube Foley>> changed 11/26 >>  DISCUSSION: [age] yoM presenting from [facility] with AMS, fever, and acute on chronic respiratory failure since 11/23 admitted for sepsis and AKI.  ASSESSMENT / PLAN:  PULMONARY A: Acute on chronic respiratory failure  Likely HCAP +/- pulmonary edema  pcxr (personally reviewed): trach and PICC in satisfactory position. Bibasilar left > right airspace disease persists but aeration marginally improved. Weaning on PSV P:   Continue PSV as tolerated  Will cont mandatory nocturnal ventilation on full support.  See ID section  Eventually goal will be nocturnal ventilation only   CARDIOVASCULAR A:  Afib (was on warfarin prior) Hx HFrEF- prior TTE 08/2016 45-50% - BNP 316 P:  Hold warfarin Plan to transition to via tube diltiazem once PEG placed Even  volume status; avoid hypovolemia Continue telemetry monitoring  RENAL A:   AKI on CKD3- still making urine; has bilateral hydronephrosis, hematuria, and what appears to be right renal collecting system hemorrhage Chronic foley Hypernatremia -His creatinine has improved, hemoglobin stable, hematuria -His Foley catheter was changed on admission on 11/27 P:   Replace free water Continue strict intake and output with goal to avoid hypovolemia given what appears to be recovering renal function Nephrology following Have asked urology to see him for ongoing hematuria, right renal collecting system hemorrhage and bilateral hydronephrosis Follow-up a.m. chemistry  GASTROINTESTINAL A:   NPO Gtube-->malpositioned  Transaminitis - appears chronic based on May 2018 labs P:   Ask IR to replace feeding tube N.p.o. until feeding tube replaced  HEMATOLOGIC A:   Anemia Acute blood loss anemia presumably from area noted on CT scan from right renal collecting system noting concern for hemorrhage -Status post 2 units of PRBCs on 11/27.  Hemoglobin holding around 8 since that time. P:  Hold anticoagulation Lower extremity SCDs Serial CBC Transfuse per protocol  INFECTIOUS A:   Sepsis: Multiple sources including hcap, what appears to be staph bacteremia, probable urinary tract infection; area of hemorrhage in the right renal collecting duct wonder if initially infectious in nature? possible osteo- Hx  of pseudomonas and serratia- trach asp in June 2018 sensitive to cefepime ? CRE per [facility] P:   Antimicrobials per infectious disease see above, now day number 3 antibiotics. Await further pending cultures  ENDOCRINE A:   Hypothyroidism  P:   Continue supplemental Synthroid  NEUROLOGIC A:   Acute encephalopathy- ddx infection/sepsis vs uremic  Hx cerebral atrophy, paraplegia w/ataxia P:   Continuing supportive care  FAMILY  - Updates: Daughter update at bedside 11/28 -  Inter-disciplinary family meet or Palliative Care meeting due by: 08/08/2017  Simonne MartinetPeter E Jeiden Daughtridge ACNP-BC [facility] Pulmonary/Critical Care Pager # [phone] OR # [phone] if no answer  08/03/2017, 10:13 AM

## 2017-08-03 NOTE — Progress Notes (Signed)
Initial Nutrition Assessment  DOCUMENTATION CODES:   Not applicable  INTERVENTION:    Vital AF 1.2 at 70 ml/h (1680 ml per day)  Provides 2016 kcal, 126 gm protein, 1362 ml free water daily  NUTRITION DIAGNOSIS:   Inadequate oral intake related to inability to eat as evidenced by NPO status.  GOAL:   Patient will meet greater than or equal to 90% of their needs  MONITOR:   TF tolerance, Vent status, I & O's  REASON FOR ASSESSMENT:   Ventilator, Consult Enteral/tube feeding initiation and management  ASSESSMENT:   66 yo male with PMH of chronic trach & vent dependence, PEG, cerebellar atrophy, functional paraplegia, R AKA (1 month PTA), A fib, COPD, HF, and hypothyroidism who was admitted on 11/26 from [facility] with weakness, AMS, AKI, sepsis.  Discussed patient in ICU rounds and with RN today. PTA he was on trach collar during the day and on vent support at night.  Patient to have PEG replaced in IR today, then TF to be resumed. Received MD Consult for TF initiation and management. Patient is currently intubated on ventilator support MV: 12.1 L/min Temp (24hrs), Avg:100.1 F (37.8 C), Min:97.8 F (36.6 C), Max:102.7 F (39.3 C)   Labs reviewed. Sodium 147 (H) CBG's: 97-100-108 Medications reviewed.  Given mild-moderate muscle depletion, patient is at increased nutrition risk.  NUTRITION - FOCUSED PHYSICAL EXAM:    Most Recent Value  Orbital Region  Moderate depletion  Upper Arm Region  No depletion  Thoracic and Lumbar Region  Unable to assess  Buccal Region  No depletion  Temple Region  No depletion  Clavicle Bone Region  Mild depletion  Clavicle and Acromion Bone Region  No depletion  Scapular Bone Region  Unable to assess  Dorsal Hand  Unable to assess  Patellar Region  No depletion  Anterior Thigh Region  Mild depletion  Posterior Calf Region  Moderate depletion  Edema (RD Assessment)  None  Hair  Reviewed  Eyes  Reviewed  Mouth  Reviewed  Skin   Reviewed  Nails  Reviewed       Diet Order:  Diet NPO time specified  EDUCATION NEEDS:   No education needs have been identified at this time  Skin:  Skin Assessment: Skin Integrity Issues: Skin Integrity Issues:: Stage I Stage I: sacrum  Last BM:  11/28  Height:   Ht Readings from Last 1 Encounters:  08/02/17 5\' 11"  (1.803 m)    Weight:   Wt Readings from Last 1 Encounters:  08/03/17 177 lb 14.6 oz (80.7 kg)    Ideal Body Weight:  71.9 kg  BMI:  27 (adjusted for AKA)  Estimated Nutritional Needs:   Kcal:  2075  Protein:  115-130 gm  Fluid:  2 L   Joaquin CourtsKimberly Remmington Teters, RD, LDN, CNSC Pager [phone] After Hours Pager [phone]

## 2017-08-03 NOTE — Procedures (Signed)
Pre procedural Dx: Dysphagia, malpositioned feeding tube. Post procedural Dx: Same  Successful fluoroscopic guided replacement, repositioning and upsizing of now 20 Fr gastrostomy tube.   The feeding tube is ready for immediate use.  EBL: None  Complications: None immediate.  Katherina RightJay Ahni Bradwell, MD Pager #: [phone]

## 2017-08-03 NOTE — Progress Notes (Signed)
Patient transported to IR with no complications. Vital signs stable throughout. Patient tolerated well. RT will continue to monitor.

## 2017-08-03 NOTE — Consult Note (Signed)
Urology Consult   Physician requesting consult: Dr. Vaughan Browner  Reason for consult: Bilateral hydronephrosis, AKI, hematuria  History of Present Illness: Craig Oneal. is a 66 y.o. 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  He has chronic respiratory failure and has a tracheostomy and is ventilator dependent at night (although has been vent dependent for two days prior to admission).  He also has a neurogenic bladder managed with chronic urethral catheter and PEG tube for feeding. He has PAF managed with warfarin, COPD, CKD (baseline Cr 1.5-2.0) and resides at [facility].  He developed altered mental status and weakness and was admitted with apparent sepsis.  UA demonstrated TNC RBCs and yeast.  Urine culture positive for 30,000 yeast.  Blood culture with GPC, respiratory culture with GNRs and rare GPRs.  A CT scan of the chest and abdomen was performed and indicated evidence of airspace disease consistent with pneumonia.  He was also noted to have bilateral hydronephrosis (CT of pelvis was not performed) with a clear source for obstruction.  No notes mention catheter obstruction at time of admission. He also has high density material in right renal collecting system (? Blood or fungal material).  Cr was 4.7 on admission and has been steadily improving and now is 2.41.  Currently on Levophed, ceftazidime, and linezolid.    Past Medical History:  Diagnosis Date  . A-fib ([place])   . Hx of AKA (above knee amputation), right ([place])   . Tracheostomy in place [facility])     Past Surgical History:  Procedure Laterality Date  . IR REPLC GASTRO/COLONIC TUBE PERCUT W/FLUORO  08/03/2017    Medications:  Home meds:  Current Meds  Medication Sig  . acetaminophen (TYLENOL) 325 MG tablet Place 650 mg into feeding tube every 6 (six) hours as needed for mild pain (temp greater than than 100.6).   Marland Kitchen aluminum-magnesium hydroxide 200-200 MG/5ML suspension Place 30  mLs into feeding tube every 6 (six) hours as needed (gas).   Marland Kitchen amikacin (AMIKIN) IVPB Inject 100 mg into the vein daily at 6 (six) AM. Order date 08/02/17 - 14 day course for UTI  . Amino Acids-Protein Hydrolys (FEEDING SUPPLEMENT, PRO-STAT SUGAR FREE 64,) LIQD Take 30 mLs by mouth 2 (two) times daily.  Marland Kitchen atorvastatin (LIPITOR) 10 MG tablet Place 10 mg into feeding tube at bedtime.   . baclofen (LIORESAL) 10 MG tablet Place 0.5 tablets (5 mg total) into feeding tube 2 (two) times daily.  . Cefepime HCl 2 GM/100ML SOLN Inject 2 g into the vein 2 (two) times daily. Order date 08/01/17: 14 day course for UTI  . chlorhexidine (PERIDEX) 0.12 % solution Use as directed 15 mLs in the mouth or throat 2 (two) times daily.   . clonazePAM (KLONOPIN) 0.5 MG tablet Place 0.25 mg into feeding tube at bedtime.   . Darbepoetin Alfa 25 MCG/ML SOLN Inject 25 mcg as directed every 28 (twenty-eight) days.   Marland Kitchen docusate sodium (COLACE) 100 MG capsule 100 mg 2 (two) times daily.  . ferrous sulfate 300 (60 Fe) MG/5ML syrup Place 5 mLs (300 mg total) into feeding tube daily with breakfast. (Patient taking differently: Place 300 mg into feeding tube 2 (two) times daily with a meal. )  . fluticasone (FLONASE) 50 MCG/ACT nasal spray Place 2 sprays into both nostrils daily.   . furosemide (LASIX) 20 MG tablet Place 20 mg into feeding tube every other day.  . heparin 5000 UNIT/ML injection Inject 5,000  Units into the skin every 12 (twelve) hours. For prophylactic  . ipratropium-albuterol (DUONEB) 0.5-2.5 (3) MG/3ML SOLN Take 3 mLs by nebulization every 2 (two) hours as needed. (Patient taking differently: Take 3 mLs by nebulization every 6 (six) hours. )  . lactulose (CHRONULAC) 10 GM/15ML solution Place 20 g into feeding tube every 12 (twelve) hours. Hold for regular BMs X 3 days  . lansoprazole (PREVACID) 30 MG capsule Place 30 mg into feeding tube daily before breakfast.  . levothyroxine (SYNTHROID, LEVOTHROID) 125 MCG tablet  Place 125 mcg into feeding tube daily before breakfast.  . loratadine (CLARITIN) 10 MG tablet Place 10 mg into feeding tube daily.   . metoprolol tartrate (LOPRESSOR) 25 MG tablet Place 0.5 tablets (12.5 mg total) into feeding tube 2 (two) times daily.  . montelukast (SINGULAIR) 10 MG tablet Place 10 mg into feeding tube at bedtime.   . Multiple Vitamin (MULTIVITAMIN WITH MINERALS) TABS tablet Place 1 tablet into feeding tube daily.   . Nutritional Supplements (NEPRO) LIQD Place 1,200 mLs into feeding tube See admin instructions. 60 m./hr via pump per G-tube - start time 10am, run until 0600 or until total volume of 1200 ml is infused  . oxyCODONE (OXY IR/ROXICODONE) 5 MG immediate release tablet Take 5-10 mg by mouth every 6 (six) hours as needed (5 mg for moderate pain, 10 mg for severe pain).  . polyethylene glycol (MIRALAX / GLYCOLAX) packet Place 17 g into feeding tube every 12 (twelve) hours.   . potassium chloride (KLOR-CON) 20 MEQ packet Place 20 mEq into feeding tube every 8 (eight) hours.  . sodium phosphate (FLEET) 7-19 GM/118ML ENEM Place 1 enema rectally daily as needed for severe constipation (constipation).  . STERILE WATER PO Place 200 mLs into feeding tube every 4 (four) hours.  . Vitamin D, Ergocalciferol, (DRISDOL) 50000 units CAPS capsule Place 50,000 Units into feeding tube every Friday.     Scheduled Meds: . chlorhexidine gluconate (MEDLINE KIT)  15 mL Mouth Rinse BID  . diltiazem  30 mg Per Tube Q6H  . heparin subcutaneous  5,000 Units Subcutaneous Q8H  . ipratropium-albuterol  3 mL Nebulization Q6H  . levothyroxine  125 mcg Per Tube QAC breakfast  . lidocaine      . mouth rinse  15 mL Mouth Rinse QID  . pantoprazole sodium  40 mg Per Tube Daily   Continuous Infusions: . ceftazidime avibactam (AVYCAZ) IVPB 0.94 g (08/03/17 1659)  . dextrose 50 mL/hr at 08/03/17 1246  . diltiazem (CARDIZEM) infusion 5 mg/hr (08/03/17 1248)  . feeding supplement (VITAL AF 1.2 CAL)  1,000 mL (08/03/17 1714)  . linezolid (ZYVOX) IV 600 mg (08/03/17 1839)  . norepinephrine (LEVOPHED) Adult infusion 2 mcg/min (08/03/17 1745)  . sodium chloride     PRN Meds:.acetaminophen, fentaNYL (SUBLIMAZE) injection, fentaNYL (SUBLIMAZE) injection, lidocaine  Allergies: No Known Allergies  No family history on file.  Social History:  has no tobacco, alcohol, and drug history on file.  ROS: A complete review of systems was performed.  All systems are negative except for pertinent findings as noted.  Physical Exam:  Vital signs in last 24 hours: Temp:  [97.8 F (36.6 C)-101.3 F (38.5 C)] 98.7 F (37.1 C) (11/28 1700) Pulse Rate:  [37-97] 74 (11/28 1800) Resp:  [21-34] 25 (11/28 1800) BP: (70-134)/(43-109) 95/54 (11/28 1800) SpO2:  [78 %-100 %] 95 % (11/28 1800) FiO2 (%):  [30 %] 30 % (11/28 1517) Weight:  [80.7 kg (177 lb 14.6 oz)] 80.7 kg (  177 lb 14.6 oz) (11/28 0500) Constitutional: No acute distress Cardiovascular: Regular rate and rhythm, No JVD Respiratory: Tracheostomy in place and on ventilator. GI: Abdomen is soft, no obvious tenderness, distended abdomen. Genitourinary: No CVAT. Normal male phallus, testes are descended bilaterally and non-tender and without masses, scrotum is normal in appearance without lesions or masses, perineum is normal on inspection.  16 Fr Foley in place with grossly clear urine draining. Lymphatic: No lymphadenopathy Neurologic: Paraplegic Psychiatric: Pt minimally responsive to voice or touch.  Laboratory Data:  Recent Labs    08/01/17 1730 08/01/17 1939 08/02/17 1130 08/02/17 1745 08/03/17 0334  WBC 17.0*  --  14.8*  --  15.2*  HGB 8.3* 7.5* 5.9* 8.3* 8.0*  HCT 26.9* 22.0* 20.8* 28.2* 27.0*  PLT 154  --  129*  --  159    Recent Labs    08/01/17 1730 08/01/17 1939 08/02/17 1130 08/03/17 1200  NA 142 146* 147* 143  K 6.5* 5.6* 4.3 3.6  CL 106 117* 122* 116*  GLUCOSE 129* 123* 96 127*  BUN 213* >140* 156* 102*   CALCIUM 11.7*  --  8.9 8.8*  CREATININE 4.74* 4.50* 3.39* 2.41*     Results for orders placed or performed during the hospital encounter of 08/01/17 (from the past 24 hour(s))  Glucose, capillary     Status: None   Collection Time: 08/02/17 11:54 PM  Result Value Ref Range   Glucose-Capillary 94 65 - 99 mg/dL  CBC     Status: Abnormal   Collection Time: 08/03/17  3:34 AM  Result Value Ref Range   WBC 15.2 (H) 4.0 - 10.5 K/uL   RBC 3.41 (L) 4.22 - 5.81 MIL/uL   Hemoglobin 8.0 (L) 13.0 - 17.0 g/dL   HCT 27.0 (L) 39.0 - 52.0 %   MCV 79.2 78.0 - 100.0 fL   MCH 23.5 (L) 26.0 - 34.0 pg   MCHC 29.6 (L) 30.0 - 36.0 g/dL   RDW 16.4 (H) 11.5 - 15.5 %   Platelets 159 150 - 400 K/uL  Magnesium     Status: None   Collection Time: 08/03/17  3:34 AM  Result Value Ref Range   Magnesium 2.1 1.7 - 2.4 mg/dL  Phosphorus     Status: None   Collection Time: 08/03/17  3:34 AM  Result Value Ref Range   Phosphorus 4.0 2.5 - 4.6 mg/dL  Glucose, capillary     Status: None   Collection Time: 08/03/17  4:28 AM  Result Value Ref Range   Glucose-Capillary 97 65 - 99 mg/dL   Comment 1 Capillary Specimen    Comment 2 Notify RN   Glucose, capillary     Status: Abnormal   Collection Time: 08/03/17  8:34 AM  Result Value Ref Range   Glucose-Capillary 100 (H) 65 - 99 mg/dL   Comment 1 Capillary Specimen    Comment 2 Notify RN   Glucose, capillary     Status: Abnormal   Collection Time: 08/03/17 11:35 AM  Result Value Ref Range   Glucose-Capillary 108 (H) 65 - 99 mg/dL   Comment 1 Capillary Specimen    Comment 2 Notify RN   Renal function panel     Status: Abnormal   Collection Time: 08/03/17 12:00 PM  Result Value Ref Range   Sodium 143 135 - 145 mmol/L   Potassium 3.6 3.5 - 5.1 mmol/L   Chloride 116 (H) 101 - 111 mmol/L   CO2 20 (L) 22 - 32 mmol/L  Glucose, Bld 127 (H) 65 - 99 mg/dL   BUN 102 (H) 6 - 20 mg/dL   Creatinine, Ser 2.41 (H) 0.61 - 1.24 mg/dL   Calcium 8.8 (L) 8.9 - 10.3 mg/dL    Phosphorus 3.9 2.5 - 4.6 mg/dL   Albumin 1.6 (L) 3.5 - 5.0 g/dL   GFR calc non Af Amer 27 (L) >60 mL/min   GFR calc Af Amer 31 (L) >60 mL/min   Anion gap 7 5 - 15  Magnesium     Status: None   Collection Time: 08/03/17  2:50 PM  Result Value Ref Range   Magnesium 1.9 1.7 - 2.4 mg/dL  Phosphorus     Status: None   Collection Time: 08/03/17  2:50 PM  Result Value Ref Range   Phosphorus 3.6 2.5 - 4.6 mg/dL  Glucose, capillary     Status: Abnormal   Collection Time: 08/03/17  5:08 PM  Result Value Ref Range   Glucose-Capillary 119 (H) 65 - 99 mg/dL   Comment 1 Venous Specimen   Magnesium     Status: None   Collection Time: 08/03/17  5:24 PM  Result Value Ref Range   Magnesium 1.9 1.7 - 2.4 mg/dL  Phosphorus     Status: None   Collection Time: 08/03/17  5:24 PM  Result Value Ref Range   Phosphorus 3.7 2.5 - 4.6 mg/dL   Recent Results (from the past 240 hour(s))  Blood Culture (routine x 2)     Status: None (Preliminary result)   Collection Time: 08/01/17  5:22 PM  Result Value Ref Range Status   Specimen Description BLOOD LEFT HAND  Final   Special Requests   Final    Blood Culture adequate volume BOTTLES DRAWN AEROBIC AND ANAEROBIC   Culture  Setup Time   Final    GRAM POSITIVE COCCI IN CLUSTERS ANAEROBIC BOTTLE ONLY CRITICAL RESULT CALLED TO, READ BACK BY AND VERIFIED WITH: PHARMD G ABBOTT [id number] MLM    Culture GRAM POSITIVE COCCI  Final   Report Status PENDING  Incomplete  Blood Culture ID Panel (Reflexed)     Status: Abnormal   Collection Time: 08/01/17  5:22 PM  Result Value Ref Range Status   Enterococcus species NOT DETECTED NOT DETECTED Final   Listeria monocytogenes NOT DETECTED NOT DETECTED Final   Staphylococcus species DETECTED (A) NOT DETECTED Final    Comment: Methicillin (oxacillin) resistant coagulase negative staphylococcus. Possible blood culture contaminant (unless isolated from more than one blood culture draw or clinical case suggests  pathogenicity). No antibiotic treatment is indicated for blood  culture contaminants. CRITICAL RESULT CALLED TO, READ BACK BY AND VERIFIED WITH: PHARMD G ABBOTT [id number] MLM    Staphylococcus aureus NOT DETECTED NOT DETECTED Final   Methicillin resistance DETECTED (A) NOT DETECTED Final    Comment: CRITICAL RESULT CALLED TO, READ BACK BY AND VERIFIED WITH: PHARMD G ABBOTT [id number] MLM    Streptococcus species NOT DETECTED NOT DETECTED Final   Streptococcus agalactiae NOT DETECTED NOT DETECTED Final   Streptococcus pneumoniae NOT DETECTED NOT DETECTED Final   Streptococcus pyogenes NOT DETECTED NOT DETECTED Final   Acinetobacter baumannii NOT DETECTED NOT DETECTED Final   Enterobacteriaceae species NOT DETECTED NOT DETECTED Final   Enterobacter cloacae complex NOT DETECTED NOT DETECTED Final   Escherichia coli NOT DETECTED NOT DETECTED Final   Klebsiella oxytoca NOT DETECTED NOT DETECTED Final   Klebsiella pneumoniae NOT DETECTED NOT DETECTED Final   Proteus species NOT DETECTED NOT  DETECTED Final   Serratia marcescens NOT DETECTED NOT DETECTED Final   Haemophilus influenzae NOT DETECTED NOT DETECTED Final   Neisseria meningitidis NOT DETECTED NOT DETECTED Final   Pseudomonas aeruginosa NOT DETECTED NOT DETECTED Final   Candida albicans NOT DETECTED NOT DETECTED Final   Candida glabrata NOT DETECTED NOT DETECTED Final   Candida krusei NOT DETECTED NOT DETECTED Final   Candida parapsilosis NOT DETECTED NOT DETECTED Final   Candida tropicalis NOT DETECTED NOT DETECTED Final  Blood Culture (routine x 2)     Status: None (Preliminary result)   Collection Time: 08/01/17  7:05 PM  Result Value Ref Range Status   Specimen Description BLOOD RIGHT PICC LINE  Final   Special Requests IN PEDIATRIC BOTTLE Blood Culture adequate volume  Final   Culture NO GROWTH 2 DAYS  Final   Report Status PENDING  Incomplete  Culture, respiratory (tracheal aspirate)     Status: None (Preliminary  result)   Collection Time: 08/01/17 11:56 PM  Result Value Ref Range Status   Specimen Description TRACHEAL ASPIRATE  Final   Special Requests NONE  Final   Gram Stain   Final    ABUNDANT WBC PRESENT,BOTH PMN AND MONONUCLEAR MODERATE GRAM NEGATIVE RODS RARE GRAM POSITIVE RODS    Culture CULTURE REINCUBATED FOR BETTER GROWTH  Final   Report Status PENDING  Incomplete  Urine culture     Status: Abnormal   Collection Time: 08/01/17 11:56 PM  Result Value Ref Range Status   Specimen Description URINE, CATHETERIZED  Final   Special Requests NONE  Final   Culture 30,000 COLONIES/mL YEAST (A)  Final   Report Status 08/03/2017 FINAL  Final  MRSA PCR Screening     Status: None   Collection Time: 08/02/17  1:05 AM  Result Value Ref Range Status   MRSA by PCR NEGATIVE NEGATIVE Final    Comment:        The GeneXpert MRSA Assay (FDA approved for NASAL specimens only), is one component of a comprehensive MRSA colonization surveillance program. It is not intended to diagnose MRSA infection nor to guide or monitor treatment for MRSA infections.     Renal Function: Recent Labs    08/01/17 1730 08/01/17 1939 08/02/17 1130 08/03/17 1200  CREATININE 4.74* 4.50* 3.39* 2.41*   Estimated Creatinine Clearance: 33 mL/min (A) (by C-G formula based on SCr of 2.41 mg/dL (H)).  Radiologic Imaging: Ct Abdomen Wo Contrast  Result Date: 08/02/2017 CLINICAL DATA:  Abnormal liver function tests. EXAM: CT CHEST AND ABDOMEN WITHOUT CONTRAST TECHNIQUE: Multidetector CT imaging of the chest and abdomen was performed following the standard protocol without IV contrast. COMPARISON:  Chest x-ray from earlier today FINDINGS: CT CHEST FINDINGS Cardiovascular: Cardiomegaly. No pericardial effusion. Atherosclerotic calcifications seen in the left more than right coronary circulation and along the aorta. Mediastinum/Nodes: Adenopathy in the bilateral mediastinum. Largest nodes are sub- carina and are difficult  to distinguish from the esophagus. Where not obscured, the esophagus appears normal. A right subcarinal node measures up to 26 mm short axis and may have mineralization. Right upper extremity PICC with tip at the upper SVC. Lungs/Pleura: Airspace disease in the left more than right lower lobes consistent with pneumonia. Tracheostomy tube in place. No cavitation or effusion. Musculoskeletal: No acute finding. CT ABDOMEN FINDINGS Hepatobiliary: No focal liver abnormality.Layering high-density in the gallbladder. No evidence of acute cholecystitis. No common bile duct dilatation. Pancreas: Unremarkable. Spleen: Unremarkable. Adrenals/Urinary Tract: Negative adrenals. Bilateral hydronephrosis and upper hydroureter to the  level of the pelvic inlet. The bladder is not covered on this abdominal CT. There is high-density within the collecting system of the upper pole right kidney favoring hemorrhage over debris/mass. No noted stone. Stomach/Bowel: Percutaneous gastrostomy tube has been retracted into the abdominal wall. There is a visible gas filled track between the catheter tip and the stomach. Oral contrast was injected and did not extravasated into the peritoneum. Oral contrast is seen within nondilated small bowel. Formed stool throughout the visualized colon. Vascular/Lymphatic: Atherosclerotic calcification. No acute vascular finding. No mass or adenopathy. Other: Trace ascites. Musculoskeletal: No acute abnormalities. These results were called by telephone at the time of interpretation on 08/02/2017 at 10:03 pm to [facility]. A bladder scan was suggested depending on foley output. IMPRESSION: 1. A percutaneous gastrostomy tube has been retracted into the abdominal wall. There is a visible and presumably mature tract seen from the catheter to the gastric lumen. Oral contrast was injected through the catheter and did not extravasate. No pneumoperitoneum or collection. 2. Advanced bilateral hydronephrosis and upper  hydroureter. Please correlate with foley output and possible bladder scan. 3. High-density in the upper right renal collecting system consistent with hemorrhage. Please correlate with UA. 4. Left more than right lower lobe pneumonia. 5. Nonspecific adenopathy with possible mineralization of subcarinal nodes; is there history of sarcoid? Consider chest CT follow-up in 3 months. 6. Cholelithiasis. Electronically Signed   By: Monte Fantasia M.D.   On: 08/02/2017 22:11   Ct Chest Wo Contrast  Result Date: 08/02/2017 CLINICAL DATA:  Abnormal liver function tests. EXAM: CT CHEST AND ABDOMEN WITHOUT CONTRAST TECHNIQUE: Multidetector CT imaging of the chest and abdomen was performed following the standard protocol without IV contrast. COMPARISON:  Chest x-ray from earlier today FINDINGS: CT CHEST FINDINGS Cardiovascular: Cardiomegaly. No pericardial effusion. Atherosclerotic calcifications seen in the left more than right coronary circulation and along the aorta. Mediastinum/Nodes: Adenopathy in the bilateral mediastinum. Largest nodes are sub- carina and are difficult to distinguish from the esophagus. Where not obscured, the esophagus appears normal. A right subcarinal node measures up to 26 mm short axis and may have mineralization. Right upper extremity PICC with tip at the upper SVC. Lungs/Pleura: Airspace disease in the left more than right lower lobes consistent with pneumonia. Tracheostomy tube in place. No cavitation or effusion. Musculoskeletal: No acute finding. CT ABDOMEN FINDINGS Hepatobiliary: No focal liver abnormality.Layering high-density in the gallbladder. No evidence of acute cholecystitis. No common bile duct dilatation. Pancreas: Unremarkable. Spleen: Unremarkable. Adrenals/Urinary Tract: Negative adrenals. Bilateral hydronephrosis and upper hydroureter to the level of the pelvic inlet. The bladder is not covered on this abdominal CT. There is high-density within the collecting system of the  upper pole right kidney favoring hemorrhage over debris/mass. No noted stone. Stomach/Bowel: Percutaneous gastrostomy tube has been retracted into the abdominal wall. There is a visible gas filled track between the catheter tip and the stomach. Oral contrast was injected and did not extravasated into the peritoneum. Oral contrast is seen within nondilated small bowel. Formed stool throughout the visualized colon. Vascular/Lymphatic: Atherosclerotic calcification. No acute vascular finding. No mass or adenopathy. Other: Trace ascites. Musculoskeletal: No acute abnormalities. These results were called by telephone at the time of interpretation on 08/02/2017 at 10:03 pm to [facility]. A bladder scan was suggested depending on foley output. IMPRESSION: 1. A percutaneous gastrostomy tube has been retracted into the abdominal wall. There is a visible and presumably mature tract seen from the catheter to the gastric lumen. Oral  contrast was injected through the catheter and did not extravasate. No pneumoperitoneum or collection. 2. Advanced bilateral hydronephrosis and upper hydroureter. Please correlate with foley output and possible bladder scan. 3. High-density in the upper right renal collecting system consistent with hemorrhage. Please correlate with UA. 4. Left more than right lower lobe pneumonia. 5. Nonspecific adenopathy with possible mineralization of subcarinal nodes; is there history of sarcoid? Consider chest CT follow-up in 3 months. 6. Cholelithiasis. Electronically Signed   By: Monte Fantasia M.D.   On: 08/02/2017 22:11   Ir Replc Gastro/colonic Tube Percut W/fluoro  Result Date: 08/03/2017 INDICATION: Malpositioned gastrostomy tube. Please perform fluoroscopic guided exchange and repositioning. EXAM: FLUOROSCOPIC GUIDED REPLACEMENT OF GASTROSTOMY TUBE COMPARISON:  CT the chest, abdomen and pelvis - 08/02/2017 MEDICATIONS: None. CONTRAST:  20 cc Isovue 300 - administered into the gastric lumen  FLUOROSCOPY TIME:  1 minute 54 seconds (989 mGy) COMPLICATIONS: None immediate. PROCEDURE: The upper abdomen and external portion of the existing gastrostomy tube was prepped and draped in the usual sterile fashion, and a sterile drape was applied covering the operative field. Maximum barrier sterile technique with sterile gowns and gloves were used for the procedure. A timeout was performed prior to the initiation of the procedure. The existing gastrostomy tube was injected with a small amount of contrast demonstrating a patent track between the end of the gastrostomy disc and the gastric lumen. The existing disc retention gastrostomy tube was removed intact. With the use of a Kumpe the catheter, a regular glidewire was advanced through the prior subcutaneous gastrostomy track to the level the gastric lumen. Contrast injection confirmed appropriate positioning. Next, over a short Amplatz wire, a new, slightly larger, 20 French balloon retention gastrostomy tube was inserted. The balloon was inflated and disc was cinched. Contrast was injected and several spot fluoroscopic images were obtained in various obliquities to confirm appropriate intraluminal positioning. A dressing was placed. The patient tolerated the procedure well without immediate postprocedural complication. IMPRESSION: Successful fluoroscopic guided replacement, repositioning and up sizing of now 20-French gastrostomy tube. The new gastrostomy tube is ready for immediate use. Electronically Signed   By: Sandi Mariscal M.D.   On: 08/03/2017 17:33   Dg Chest Port 1 View  Result Date: 08/03/2017 CLINICAL DATA:  66 year old male with acute renal failure, severe hyper code anemia. Acute on chronic respiratory failure. EXAM: PORTABLE CHEST 1 VIEW COMPARISON:  08/02/2017 and earlier. FINDINGS: Portable AP semi upright view at 0423 hours. Stable tracheostomy. Stable right PICC line. Increased bilateral pulmonary interstitial opacity since yesterday, a  mildly asymmetrically worse in the left lung. Stable lung volumes. Stable cardiac size and mediastinal contours. No pneumothorax. Increased patchy and streaky bilateral infrahilar opacity. No large pleural effusion. IMPRESSION: 1. Interval increased bilateral pulmonary interstitial opacity and streaky medial lung base opacity. 2. Favor acute interstitial edema with atelectasis rather than acute viral/atypical respiratory infection. Electronically Signed   By: Genevie Ann M.D.   On: 08/03/2017 06:55   Dg Chest Port 1 View  Result Date: 08/02/2017 CLINICAL DATA:  Respiratory failure. EXAM: PORTABLE CHEST 1 VIEW COMPARISON:  Yesterday at 1741 hour FINDINGS: Tracheostomy tube at thoracic inlet. Right upper extremity PICC tip in the mid SVC. Cardiomegaly is unchanged. Possible developing left pleural effusion and retrocardiac consolidation. Similar-appearing heterogeneous bilateral airspace opacity. No pneumothorax. IMPRESSION: 1. Possible developing left pleural effusion a retrocardiac consolidation. 2. Heterogeneous bilateral airspace opacity appears similar, pulmonary edema or infectious. Electronically Signed   By: Fonnie Birkenhead.D.  On: 08/02/2017 05:02    I independently reviewed the above imaging studies.  Impression/Recommendation  1) Bilateral hydronephrosis: Incompletely evaluated without imaging of pelvis.  Will need repeat CT of abdomen and pelvis without contrast to determine if there is an obvious etiology for hydronephrosis.  Pending this study, he may require nephrostomy tube placement to optimize renal function although I would wait for repeat imaging. Would continue to hold anticoagulation. 2) Hyperdense material in right renal collecting system: May represent blood or possibly fungal material considering urine culture and chronic indwelling catheter.  May require further evaluation in the future although extent of this evaluation may depend on his and his family's decision as to how  aggressive of an evaluation they wish to pursue considering his overall medical condition and risks that may be associated with any operative evaluation (which are likely to be significant). 3) Yeast funguria: Unclear whether this is simply asymptomatic funguria or significant part of his infection/sepsis.  Will leave to discretion of CCM as to whether to treat.  Would be appropriate to consider repeating culture.  If patient has ureteral obstruction (unclear right now), he probably should consider being treated and will be more likely to require renal drainage to optimize recovery.   Pt's daughter, Rogers Seeds, is closest relative. Phone number is [phone].  Joellen Tullos,LES 08/03/2017, 6:55 PM    Pryor Curia. MD  CC: Dr. Vaughan Browner

## 2017-08-04 ENCOUNTER — Inpatient Hospital Stay (HOSPITAL_COMMUNITY): Payer: Medicare Other

## 2017-08-04 LAB — CBC
HEMATOCRIT: 25.8 % — AB (ref 39.0–52.0)
HEMOGLOBIN: 7.5 g/dL — AB (ref 13.0–17.0)
MCH: 22.9 pg — AB (ref 26.0–34.0)
MCHC: 29.1 g/dL — AB (ref 30.0–36.0)
MCV: 78.9 fL (ref 78.0–100.0)
PLATELETS: 159 10*3/uL (ref 150–400)
RBC: 3.27 MIL/uL — ABNORMAL LOW (ref 4.22–5.81)
RDW: 16.7 % — ABNORMAL HIGH (ref 11.5–15.5)
WBC: 11.3 10*3/uL — ABNORMAL HIGH (ref 4.0–10.5)

## 2017-08-04 LAB — BASIC METABOLIC PANEL
ANION GAP: 6 (ref 5–15)
BUN: 89 mg/dL — ABNORMAL HIGH (ref 6–20)
CHLORIDE: 118 mmol/L — AB (ref 101–111)
CO2: 21 mmol/L — AB (ref 22–32)
Calcium: 8.6 mg/dL — ABNORMAL LOW (ref 8.9–10.3)
Creatinine, Ser: 2.2 mg/dL — ABNORMAL HIGH (ref 0.61–1.24)
GFR calc Af Amer: 35 mL/min — ABNORMAL LOW (ref >60)
GFR calc non Af Amer: 30 mL/min — ABNORMAL LOW (ref >60)
GLUCOSE: 155 mg/dL — AB (ref 65–99)
POTASSIUM: 3.4 mmol/L — AB (ref 3.5–5.1)
Sodium: 145 mmol/L (ref 135–145)

## 2017-08-04 LAB — GLUCOSE, CAPILLARY
GLUCOSE-CAPILLARY: 112 mg/dL — AB (ref 65–99)
GLUCOSE-CAPILLARY: 127 mg/dL — AB (ref 65–99)
GLUCOSE-CAPILLARY: 136 mg/dL — AB (ref 65–99)
GLUCOSE-CAPILLARY: 74 mg/dL (ref 65–99)
Glucose-Capillary: 101 mg/dL — ABNORMAL HIGH (ref 65–99)
Glucose-Capillary: 116 mg/dL — ABNORMAL HIGH (ref 65–99)
Glucose-Capillary: 120 mg/dL — ABNORMAL HIGH (ref 65–99)

## 2017-08-04 LAB — PHOSPHORUS
Phosphorus: 2.9 mg/dL (ref 2.5–4.6)
Phosphorus: 2.9 mg/dL (ref 2.5–4.6)

## 2017-08-04 LAB — MAGNESIUM
MAGNESIUM: 2 mg/dL (ref 1.7–2.4)
Magnesium: 1.7 mg/dL (ref 1.7–2.4)

## 2017-08-04 MED ORDER — DEXTROSE 5 % IV SOLN
1.2500 g | Freq: Three times a day (TID) | INTRAVENOUS | Status: DC
  Administered 2017-08-04 – 2017-08-06 (×6): 1.25 g via INTRAVENOUS
  Filled 2017-08-04 (×10): qty 6

## 2017-08-04 MED ORDER — IOPAMIDOL (ISOVUE-300) INJECTION 61%
INTRAVENOUS | Status: AC
  Administered 2017-08-04: 30 mL
  Filled 2017-08-04: qty 30

## 2017-08-04 NOTE — Progress Notes (Signed)
PULMONARY / CRITICAL [facility]ICINE   Name: Craig DiegoCharlie J Devol Jr. MRN: [id number] DOB: 06/16/1951    ADMISSION DATE:  08/01/2017 CONSULTATION DATE:  08/01/2017  REFERRING MD:  Dr. Fayrene FearingJames  CHIEF COMPLAINT:  Fever, AMS  HISTORY OF PRESENT ILLNESS:  HPI obtained from medical chart review and from daughter at bedside as patient has acute encephalopathy on mechanical ventilator.   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 Blood cultures sent and started on cefepime 11/26 and amikacin 11/27.  Additionally, daughter reports patient normally requires ventilator at night normally but has required continously for the last two days.    [facility]; [facility]  [facility] with proteus bacteremia, septic and hemorrhagic shock.  Additionally has history of serratia and pseudomonas from tracheal aspirate in July, both sensitive to Cefepime, as well as enterobacter and morganella.  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    In ER, initial labs noted for K 6.5, BUN 213, sCr 4.74 (   ), WBC 17, Hgb 8.3, BNP 316, lactic acid 1.2 -> 0.82,  EKG with afib rate 132, CXR with bilateral infiltrates versus edema.  Normotensive in ER.  Treated with sepsis protocol with 2L NS bolus, vancomycin and cefepime given after blood cultures, and lopressor once for HR control.  PCCM to admit patient.     SUBJECTIVE:  Off pressors since today AM Transitioned to trach collar.   VITAL SIGNS: BP (!) 103/52   Pulse (!) 45   Temp (!) 101.2 F (38.4 C) (Oral) Comment: RN Marybeth aware   Resp (!) 33   Ht 5\' 11"   (1.803 m)   Wt 173 lb 4.5 oz (78.6 kg)   SpO2 91%   BMI 24.17 kg/m   HEMODYNAMICS: CVP:  [9 mmHg-13 mmHg] 9 mmHg  VENTILATOR SETTINGS: Vent Mode: PRVC FiO2 (%):  [30 %-35 %] 35 % Set Rate:  [14 bmp-24 bmp] 24 bmp Vt Set:  [500 mL] 500 mL PEEP:  [5 cmH20] 5 cmH20 Pressure Support:  [10 cmH20] 10 cmH20 Plateau Pressure:  [17 cmH20-21 cmH20] 20 cmH20  INTAKE / OUTPUT: I/O last 3 completed shifts: In: 6637.9 [I.V.:4394.2; NG/GT:893.7; IV Piggyback:1350] Out: 3935 [Urine:3935]  PHYSICAL EXAMINATION: Gen:      No acute distress  HEENT:  EOMI, sclera anicteric. Trach collar.  Neck:     No masses; no thyromegaly Lungs:    Irregular; normal respiratory effort CV:         Regular rate and rhythm; no murmurs Abd:      PEG tube Ext:    No edema; adequate peripheral perfusion Skin:      Warm and dry; no rash Neuro: Awake, responds to commands  LABS:  BMET Recent Labs  Lab 08/02/17 1130 08/03/17 1200 08/04/17 0444  NA 147* 143 145  K 4.3 3.6 3.4*  CL 122* 116* 118*  CO2 19* 20* 21*  BUN 156* 102* 89*  CREATININE 3.39* 2.41* 2.20*  GLUCOSE 96 127* 155*    Electrolytes Recent Labs  Lab 08/02/17 1130  08/03/17 1200 08/03/17 1450 08/03/17 1724 08/04/17 0444  CALCIUM 8.9  --  8.8*  --   --  8.6*  MG  --    < >  --  1.9 1.9 2.0  PHOS  --    < > 3.9 3.6 3.7 2.9   < > = values in this interval not displayed.    CBC Recent Labs  Lab 08/02/17 1130 08/02/17 1745 08/03/17 0334 08/04/17 0444  WBC 14.8*  --  15.2* 11.3*  HGB 5.9* 8.3* 8.0* 7.5*  HCT 20.8* 28.2* 27.0* 25.8*  PLT 129*  --  159 159    Coag's Recent Labs  Lab 08/01/17 2146  APTT 21*  INR 1.18    Sepsis Markers Recent Labs  Lab 08/01/17 1743 08/01/17 1939 08/02/17 1130  LATICACIDVEN 1.20 0.82  --   PROCALCITON  --   --  7.71    ABG Recent Labs  Lab 08/01/17 2144  PHART 7.386  PCO2ART 36.8  PO2ART 84.0    Liver Enzymes Recent Labs  Lab 08/01/17 1730 08/02/17 1130  08/03/17 1200  AST 55* 39  --   ALT 71* 43  --   ALKPHOS 229* 153*  --   BILITOT 0.6 0.5  --   ALBUMIN 2.3* 1.6* 1.6*    Cardiac Enzymes No results for input(s): TROPONINI, PROBNP in the last 168 hours.  Glucose Recent Labs  Lab 08/03/17 1135 08/03/17 1708 08/03/17 2014 08/04/17 0009 08/04/17 0406 08/04/17 0817  GLUCAP 108* 119* 125* 136* 127* 116*    Imaging Ir Replc Gastro/colonic Tube Percut W/fluoro  Result Date: 08/03/2017 INDICATION: Malpositioned gastrostomy tube. Please perform fluoroscopic guided exchange and repositioning. EXAM: FLUOROSCOPIC GUIDED REPLACEMENT OF GASTROSTOMY TUBE COMPARISON:  CT the chest, abdomen and pelvis - 08/02/2017 MEDICATIONS: None. CONTRAST:  20 cc Isovue 300 - administered into the gastric lumen FLUOROSCOPY TIME:  1 minute 54 seconds (128 mGy) COMPLICATIONS: None immediate. PROCEDURE: The upper abdomen and external portion of the existing gastrostomy tube was prepped and draped in the usual sterile fashion, and a sterile drape was applied covering the operative field. Maximum barrier sterile technique with sterile gowns and gloves were used for the procedure. A timeout was performed prior to the initiation of the procedure. The existing gastrostomy tube was injected with a small amount of contrast demonstrating a patent track between the end of the gastrostomy disc and the gastric lumen. The existing disc retention gastrostomy tube was removed intact. With the use of a Kumpe the catheter, a regular glidewire was advanced through the prior subcutaneous gastrostomy track to the level the gastric lumen. Contrast injection confirmed appropriate positioning. Next, over a short Amplatz wire, a new, slightly larger, 20 French balloon retention gastrostomy tube was inserted. The balloon was inflated and disc was cinched. Contrast was injected and several spot fluoroscopic images were obtained in various obliquities to confirm appropriate intraluminal  positioning. A dressing was placed. The patient tolerated the procedure well without immediate postprocedural complication. IMPRESSION: Successful fluoroscopic guided replacement, repositioning and up sizing of now 20-French gastrostomy tube. The new gastrostomy tube is ready for immediate use. Electronically Signed   By: Simonne ComeJohn  Watts M.D.   On: 08/03/2017 17:33   STUDIES:  11/26 CXR >> bilateral infiltrates vs edema 11/27 CXR >> stable bilateral opacities TTE >> obtained 11/27: Mild increased left ventricular hypertrophy.  Systolic function was mildly reduced at 40-45% with global hypokinesis and inferior basal akinesis.  The right ventricle was mildly dilated but systolic function normal.  The right atrium was mildly dilated there is moderate tricuspid valve regurg.  There are no echocardiograms on file to  compare CT chest and abdomen obtained.  11/27: Percutaneous gastrostomy tube retracted into the abdominal wall.  Radiologist reports what appears to be mature tract to the gastric lumen given oral contrast injected through catheter did not extravasate.  There is no pneumoperitoneum or fluid collection.  Advanced bilateral hydronephrosis with upper hydroureter.  High density in the right renal collecting system consistent with hemorrhage.  Left greater than right lower lobe airspace disease. CULTURES: Prior cultures at [facility] 11/23 ? 11/26 BCx 2: gpc clusters>>> 11/26 trach aspirate: Abundant WBC, rare gram-positive rods, moderate gram negative rods 11/26 UC >> 11/26: blood culture RID: + MRSA  ANTIBIOTICS: ? Prior Amikacin >>11/27 11/26 Cefepime (at [facility]) >>11/27 11/26 Vancomycin >>11/27 Linezolid 11/27>>> Ceftaz/avibactam 11/27>>>  SIGNIFICANT EVENTS: 11/26 Admit  LINES/TUBES: R PICC from [facility] -  Gastrostomy tube Foley>> changed 11/26 >>  DISCUSSION: 53 yoM presenting from [facility] with AMS, fever, and acute on chronic respiratory failure since 11/23 admitted for sepsis and  AKI.  ASSESSMENT / PLAN:  PULMONARY A: Acute on chronic respiratory failure  Likely HCAP +/- pulmonary edema  pcxr (personally reviewed): trach and PICC in satisfactory position. Bibasilar left > right airspace disease persists but aeration marginally improved. Weaning on PSV P:   Trach collar as tolerated during the day Needs vent at night  CARDIOVASCULAR A:  Afib (was on warfarin prior) Hx HFrEF- prior TTE 08/2016 45-50% - BNP 316 P:  Hold warfarin given the hematuria Cardizem changed to PO Tele monitoring Keep I/O even  RENAL A:   AKI on CKD3- still making urine; has bilateral hydronephrosis, hematuria, and what appears to be right renal collecting system hemorrhage Chronic foley Hypernatremia -His creatinine has improved, hemoglobin stable, hematuria -His Foley catheter was changed on admission on 11/27 P:   Nephrology following Urology consulted. Get CT abd/pelvis. May need a nephrostomy tube  GASTROINTESTINAL A:   NPO Gtube-->malpositioned > replaced by IR Transaminitis - appears chronic based on May 2018 labs P:   Continue tube feeds  HEMATOLOGIC A:   Anemia Acute blood loss anemia presumably from area noted on CT scan from right renal collecting system noting concern for hemorrhage -Status post 2 units of PRBCs on 11/27.  P:  Hold anticoagulation Lower extremity SCDs Serial CBC Transfuse per protocol  INFECTIOUS A:   Sepsis: Multiple sources including hcap, what appears to be staph bacteremia, probable urinary tract infection; area of hemorrhage in the right renal collecting duct wonder if initially infectious in nature? possible osteo- Hx of pseudomonas and serratia- trach asp in June 2018 sensitive to cefepime ? CRE per [facility] P:   Antimicrobials per infectious disease see above, now day number 4 antibiotics. Await further pending cultures  ENDOCRINE A:   Hypothyroidism  P:   Continue supplemental Synthroid  NEUROLOGIC A:   Acute  encephalopathy- ddx infection/sepsis vs uremic  Hx cerebral atrophy, paraplegia w/ataxia P:   Continuing supportive [facility]  FAMILY  - Updates: Daughter update at bedside 11/28 - Inter-disciplinary family meet or Palliative [facility] meeting due by: 08/08/2017  The patient is critically ill with multiple organ system failure and requires high complexity decision making for assessment and support, frequent evaluation and titration of therapies, advanced monitoring, review of radiographic studies and interpretation of complex data.   Critical [facility] Time devoted to patient [facility] services, exclusive of separately billable procedures, described in this note is 35 minutes.   Chilton Greathouse MD Craig [facility] Pager [phone] If no answer or after 3pm  call: [phone] 08/04/2017, 11:09 AM

## 2017-08-04 NOTE — Care Management Note (Signed)
Case Management Note  Patient Details  Name: Craig DiegoCharlie J Becvar Jr. MRN: [id number] Date of Birth: [birth year]  Subjective/Objective:  Pt admitted with fever and AMS                   Action/Plan:  PTA from [facility], pt does not have anymore LTACH days for the calendar year.  CSW consulted, CM will continue to follow for discharge needs   Expected Discharge Date:                  Expected Discharge Plan:  Skilled Nursing Facility(from [facility])  In-House Referral:  Clinical Social Work  Discharge planning Services  CM Consult  Post Acute Care Choice:    Choice offered to:     DME Arranged:    DME Agency:     HH Arranged:    HH Agency:     Status of Service:     If discussed at [employer] Meetings, dates discussed:    Additional Comments:  Craig [facility], Craig Convey S, RN 08/04/2017, 6:00 PM

## 2017-08-04 NOTE — Progress Notes (Signed)
Attempted patient on wean through vent and RR went into the 40's. Per RN patient was on ATC during the day and vent QHS at facility. Patient placed on ATC 35%. Currently patient does not look in distress. SPO2 95% and RR varies from 27-mid 30's. Will continue to wean patient as long as he does not appear in distress and oxygen and vitals are stable.

## 2017-08-04 NOTE — Progress Notes (Signed)
[place] for Infectious Disease  Date of Admission:  08/01/2017     Total days of antibiotics 3   Ceftaz-Avibactam 11/27  Linezolid 11/27  Patient ID: Craig [facility]. is a 66 y.o. AA paraplegic male with chronic trach/ventilator dependence, PEG, foley from [facility]. Admitted with concern for HCAP, A/C CKD, anemia and fevers. 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 chlorhexidine gluconate (MEDLINE KIT)  15 mL Mouth Rinse BID  . diltiazem  30 mg Per Tube Q6H  . heparin subcutaneous  5,000 Units Subcutaneous Q8H  . ipratropium-albuterol  3 mL Nebulization Q6H  . levothyroxine  125 mcg Per Tube QAC breakfast  . mouth rinse  15 mL Mouth Rinse QID  . pantoprazole sodium  40 mg Per Tube Daily    SUBJECTIVE: Opens eyes and attempts to communicate with facial expression. Denies pain.   Review of Systems: Review of Systems  Unable to perform ROS: Patient nonverbal    No Known Allergies  OBJECTIVE: Vitals:   08/04/17 1000 08/04/17 1150 08/04/17 1156 08/04/17 1200  BP: (!) 103/52   118/67  Pulse: (!) 45 (!) 107  98  Resp: (!) 33 (!) 35  (!) 32  Temp:   (!) 100.7 F (38.2 C)   TempSrc:   Oral   SpO2: 91%   94%  Weight:      Height:       Body mass index is 24.17 kg/m.  Physical Exam  Constitutional: He is oriented to person, place, and time and well-developed, well-nourished, and in no distress.  Sitting up in bed, opens eyes easily and holding good eye contact with conversation.   HENT:  Mouth/Throat: Oropharynx is clear and moist. No oral lesions. Normal dentition. No dental caries.  Eyes: Pupils are equal, round, and reactive to light. No scleral icterus.  Cardiovascular: Normal heart sounds. An irregularly irregular rhythm present. Tachycardia present.    Pulmonary/Chest: Effort normal and breath sounds normal. Tachypnea noted.  35% trach collar. With fairly thin clear/white secretions suctioned from trach.   Abdominal: Soft. He exhibits no distension. There is no tenderness.  Genitourinary:  Genitourinary Comments: Urine clear yellow. Foley in place.   Musculoskeletal:  Moves LEs only   Lymphadenopathy:    He has no cervical adenopathy.  Neurological: He is alert and oriented to person, place, and time.  Skin: Skin is warm and dry. No rash noted.  Psychiatric:  Nonverbal     Lab Results Lab Results  Component Value Date   WBC 11.3 (H) 08/04/2017   HGB 7.5 (L) 08/04/2017   HCT 25.8 (L) 08/04/2017   MCV 78.9 08/04/2017   PLT 159 08/04/2017    Lab Results  Component Value Date   CREATININE 2.20 (H) 08/04/2017   BUN 89 (H) 08/04/2017   NA 145 08/04/2017   K 3.4 (L) 08/04/2017   CL 118 (H) 08/04/2017   CO2 21 (L) 08/04/2017    Lab Results  Component Value Date   ALT 43 08/02/2017   AST 39 08/02/2017   ALKPHOS 153 (H) 08/02/2017   BILITOT 0.5 08/02/2017     Microbiology: BCx 11/26 >> 1/2 sets + CoNS species  BCx 11/28 >> pending Trach Asp Cx >> moderate GNR, Rare GPR on  GS; Few GNR on Cx growing  Urine Cx 11/26 >> 30k yeast   Assessment & Plan:  1. Acute on Chronic Respiratory Failure, HCAP = CT L > R LL pneumonia. Trach aspirate with few GNR growing.   Seems to be making some improvements as he is tolerating trach collar --> albeit he is tachypneic on exam and not certain he will be able to wean much longer today.   Still with fevers ~ 101 today   May be able to stop linezolid soon considering culture from respiratory sample. Will await preliminary repeat BCx data.   2. Encephalopathy = opens eyes to commands and appears to be at baseline regarding communication efforts.   3. Sepsis = off vasoactive medications today.   MRSE likely contaminant - repeat BCx pending  4. Acute on Chronic CKD, Hematuria =  creatine improving. No longer with gross hematuria  May need nephrostomy tubes --> urology following  Janene Madeira, MSN, NP-C [facility] [place] Cell: [phone] Pager: [phone]  08/04/2017  1:11 PM

## 2017-08-04 NOTE — Progress Notes (Signed)
Patient ID: Craig Bernards., male   DOB: [birth year], 66 y.o.   MRN: [id number]    Subjective: Pt continues to gradually improve.  Now off pressors.   Objective: Vital signs in last 24 hours: Temp:  [99.3 F (37.4 C)-101.8 F (38.8 C)] 99.3 F (37.4 C) (11/29 1616) Pulse Rate:  [45-110] 98 (11/29 1800) Resp:  [22-43] 29 (11/29 1800) BP: (84-132)/(43-82) 111/59 (11/29 1800) SpO2:  [91 %-100 %] 98 % (11/29 1800) FiO2 (%):  [30 %-40 %] 40 % (11/29 1600) Weight:  [78.6 kg (173 lb 4.5 oz)] 78.6 kg (173 lb 4.5 oz) (11/29 0450)  Intake/Output from previous day: 11/28 0701 - 11/29 0700 In: 3794.2 [I.V.:1950.6; NG/GT:893.7; IV Piggyback:950] Out: 2685 [Urine:2685] Intake/Output this shift: Total I/O In: 985.1 [I.V.:215.1; NG/GT:770] Out: 125 [Urine:125]  Physical Exam:  General: Alert and oriented GU: Urine grossly clear  Lab Results: Recent Labs    08/02/17 1745 08/03/17 0334 08/04/17 0444  HGB 8.3* 8.0* 7.5*  HCT 28.2* 27.0* 25.8*   CBC Latest Ref Rng & Units 08/04/2017 08/03/2017 08/02/2017  WBC 4.0 - 10.5 K/uL 11.3(H) 15.2(H) -  Hemoglobin 13.0 - 17.0 g/dL 7.5(L) 8.0(L) 8.3(L)  Hematocrit 39.0 - 52.0 % 25.8(L) 27.0(L) 28.2(L)  Platelets 150 - 400 K/uL 159 159 -     BMET Recent Labs    08/03/17 1200 08/04/17 0444  NA 143 145  K 3.6 3.4*  CL 116* 118*  CO2 20* 21*  GLUCOSE 127* 155*  BUN 102* 89*  CREATININE 2.41* 2.20*  CALCIUM 8.8* 8.6*     Studies/Results: Ct Abdomen Pelvis Wo Contrast  Result Date: 08/04/2017 CLINICAL DATA:  Multisystem organ failure. Recent gastrostomy tube placement. Hydronephrosis seen on recent CT scan from 2 days ago. EXAM: CT ABDOMEN AND PELVIS WITHOUT CONTRAST TECHNIQUE: Multidetector CT imaging of the abdomen and pelvis was performed following the standard protocol without IV contrast. COMPARISON:  CT scan 08/02/2017 FINDINGS: Lower chest: There are small bilateral pleural effusions and bibasilar atelectasis. The heart is  enlarged. No definite pericardial effusion. The distal esophagus is grossly normal. Hepatobiliary: No focal hepatic lesions. Numerous small layering gallstones are noted in the gallbladder but no definite findings for acute cholecystitis. No common bile duct dilatation. Pancreas: No definite mass, inflammation or ductal dilatation. Spleen: Normal size.  No focal lesions. Adrenals/Urinary Tract: The adrenal glands are unremarkable. Right-sided hydroureteronephrosis all the way down to the bladder without definite obstructing calculus or mass. The bladder is decompressed by Foley catheter. There is an extra renal pelvis noted on left side but no definite hydronephrosis and no left-sided hydroureter. Stomach/Bowel: The feeding gastrostomy tube is in good position. No complicating features are identified. The small bowel and colon are grossly normal. No mass or obstruction. Vascular/Lymphatic: Advanced atherosclerotic calcifications involving the aorta and iliac arteries but no aneurysm. Small scattered mesenteric and retroperitoneal lymph nodes but no mass or overt adenopathy. Reproductive: The prostate gland and seminal vesicles are unremarkable. Other: Small scattered pelvic lymph nodes without overt adenopathy. No inguinal mass or adenopathy. Musculoskeletal: No significant bony findings. IMPRESSION: 1. The feeding gastrostomy tube appears be in good position without complicating features. 2. Small bilateral pleural effusions and bibasilar atelectasis. 3. Cholelithiasis without definite CT findings for acute cholecystitis. 4. Right-sided hydroureteronephrosis without obvious cause. No distal obstructing ureteral calculus or obvious bladder lesion. The bladder is decompressed by a Foley catheter. 5. Small amount of free abdominal and free pelvic fluid. Electronically Signed   By: Orlene Plum.D.  On: 08/04/2017 16:14   Ct Abdomen Wo Contrast  Result Date: 08/02/2017 CLINICAL DATA:  Abnormal liver function  tests. EXAM: CT CHEST AND ABDOMEN WITHOUT CONTRAST TECHNIQUE: Multidetector CT imaging of the chest and abdomen was performed following the standard protocol without IV contrast. COMPARISON:  Chest x-ray from earlier today FINDINGS: CT CHEST FINDINGS Cardiovascular: Cardiomegaly. No pericardial effusion. Atherosclerotic calcifications seen in the left more than right coronary circulation and along the aorta. Mediastinum/Nodes: Adenopathy in the bilateral mediastinum. Largest nodes are sub- carina and are difficult to distinguish from the esophagus. Where not obscured, the esophagus appears normal. A right subcarinal node measures up to 26 mm short axis and may have mineralization. Right upper extremity PICC with tip at the upper SVC. Lungs/Pleura: Airspace disease in the left more than right lower lobes consistent with pneumonia. Tracheostomy tube in place. No cavitation or effusion. Musculoskeletal: No acute finding. CT ABDOMEN FINDINGS Hepatobiliary: No focal liver abnormality.Layering high-density in the gallbladder. No evidence of acute cholecystitis. No common bile duct dilatation. Pancreas: Unremarkable. Spleen: Unremarkable. Adrenals/Urinary Tract: Negative adrenals. Bilateral hydronephrosis and upper hydroureter to the level of the pelvic inlet. The bladder is not covered on this abdominal CT. There is high-density within the collecting system of the upper pole right kidney favoring hemorrhage over debris/mass. No noted stone. Stomach/Bowel: Percutaneous gastrostomy tube has been retracted into the abdominal wall. There is a visible gas filled track between the catheter tip and the stomach. Oral contrast was injected and did not extravasated into the peritoneum. Oral contrast is seen within nondilated small bowel. Formed stool throughout the visualized colon. Vascular/Lymphatic: Atherosclerotic calcification. No acute vascular finding. No mass or adenopathy. Other: Trace ascites. Musculoskeletal: No acute  abnormalities. These results were called by telephone at the time of interpretation on 08/02/2017 at 10:03 pm to [facility]. A bladder scan was suggested depending on foley output. IMPRESSION: 1. A percutaneous gastrostomy tube has been retracted into the abdominal wall. There is a visible and presumably mature tract seen from the catheter to the gastric lumen. Oral contrast was injected through the catheter and did not extravasate. No pneumoperitoneum or collection. 2. Advanced bilateral hydronephrosis and upper hydroureter. Please correlate with foley output and possible bladder scan. 3. High-density in the upper right renal collecting system consistent with hemorrhage. Please correlate with UA. 4. Left more than right lower lobe pneumonia. 5. Nonspecific adenopathy with possible mineralization of subcarinal nodes; is there history of sarcoid? Consider chest CT follow-up in 3 months. 6. Cholelithiasis. Electronically Signed   By: Marnee SpringJonathon  Watts M.D.   On: 08/02/2017 22:11   Ct Chest Wo Contrast  Result Date: 08/02/2017 CLINICAL DATA:  Abnormal liver function tests. EXAM: CT CHEST AND ABDOMEN WITHOUT CONTRAST TECHNIQUE: Multidetector CT imaging of the chest and abdomen was performed following the standard protocol without IV contrast. COMPARISON:  Chest x-ray from earlier today FINDINGS: CT CHEST FINDINGS Cardiovascular: Cardiomegaly. No pericardial effusion. Atherosclerotic calcifications seen in the left more than right coronary circulation and along the aorta. Mediastinum/Nodes: Adenopathy in the bilateral mediastinum. Largest nodes are sub- carina and are difficult to distinguish from the esophagus. Where not obscured, the esophagus appears normal. A right subcarinal node measures up to 26 mm short axis and may have mineralization. Right upper extremity PICC with tip at the upper SVC. Lungs/Pleura: Airspace disease in the left more than right lower lobes consistent with pneumonia. Tracheostomy tube in  place. No cavitation or effusion. Musculoskeletal: No acute finding. CT ABDOMEN FINDINGS Hepatobiliary: No focal  liver abnormality.Layering high-density in the gallbladder. No evidence of acute cholecystitis. No common bile duct dilatation. Pancreas: Unremarkable. Spleen: Unremarkable. Adrenals/Urinary Tract: Negative adrenals. Bilateral hydronephrosis and upper hydroureter to the level of the pelvic inlet. The bladder is not covered on this abdominal CT. There is high-density within the collecting system of the upper pole right kidney favoring hemorrhage over debris/mass. No noted stone. Stomach/Bowel: Percutaneous gastrostomy tube has been retracted into the abdominal wall. There is a visible gas filled track between the catheter tip and the stomach. Oral contrast was injected and did not extravasated into the peritoneum. Oral contrast is seen within nondilated small bowel. Formed stool throughout the visualized colon. Vascular/Lymphatic: Atherosclerotic calcification. No acute vascular finding. No mass or adenopathy. Other: Trace ascites. Musculoskeletal: No acute abnormalities. These results were called by telephone at the time of interpretation on 08/02/2017 at 10:03 pm to [facility]. A bladder scan was suggested depending on foley output. IMPRESSION: 1. A percutaneous gastrostomy tube has been retracted into the abdominal wall. There is a visible and presumably mature tract seen from the catheter to the gastric lumen. Oral contrast was injected through the catheter and did not extravasate. No pneumoperitoneum or collection. 2. Advanced bilateral hydronephrosis and upper hydroureter. Please correlate with foley output and possible bladder scan. 3. High-density in the upper right renal collecting system consistent with hemorrhage. Please correlate with UA. 4. Left more than right lower lobe pneumonia. 5. Nonspecific adenopathy with possible mineralization of subcarinal nodes; is there history of sarcoid?  Consider chest CT follow-up in 3 months. 6. Cholelithiasis. Electronically Signed   By: Marnee SpringJonathon  Watts M.D.   On: 08/02/2017 22:11   Ir Replc Gastro/colonic Tube Percut W/fluoro  Result Date: 08/03/2017 INDICATION: Malpositioned gastrostomy tube. Please perform fluoroscopic guided exchange and repositioning. EXAM: FLUOROSCOPIC GUIDED REPLACEMENT OF GASTROSTOMY TUBE COMPARISON:  CT the chest, abdomen and pelvis - 08/02/2017 MEDICATIONS: None. CONTRAST:  20 cc Isovue 300 - administered into the gastric lumen FLUOROSCOPY TIME:  1 minute 54 seconds (128 mGy) COMPLICATIONS: None immediate. PROCEDURE: The upper abdomen and external portion of the existing gastrostomy tube was prepped and draped in the usual sterile fashion, and a sterile drape was applied covering the operative field. Maximum barrier sterile technique with sterile gowns and gloves were used for the procedure. A timeout was performed prior to the initiation of the procedure. The existing gastrostomy tube was injected with a small amount of contrast demonstrating a patent track between the end of the gastrostomy disc and the gastric lumen. The existing disc retention gastrostomy tube was removed intact. With the use of a Kumpe the catheter, a regular glidewire was advanced through the prior subcutaneous gastrostomy track to the level the gastric lumen. Contrast injection confirmed appropriate positioning. Next, over a short Amplatz wire, a new, slightly larger, 20 French balloon retention gastrostomy tube was inserted. The balloon was inflated and disc was cinched. Contrast was injected and several spot fluoroscopic images were obtained in various obliquities to confirm appropriate intraluminal positioning. A dressing was placed. The patient tolerated the procedure well without immediate postprocedural complication. IMPRESSION: Successful fluoroscopic guided replacement, repositioning and up sizing of now 20-French gastrostomy tube. The new  gastrostomy tube is ready for immediate use. Electronically Signed   By: Simonne ComeJohn  Watts M.D.   On: 08/03/2017 17:33   Dg Chest Port 1 View  Result Date: 08/03/2017 CLINICAL DATA:  66 year old male with acute renal failure, severe hyper code anemia. Acute on chronic respiratory failure. EXAM: PORTABLE CHEST 1  VIEW COMPARISON:  08/02/2017 and earlier. FINDINGS: Portable AP semi upright view at 0423 hours. Stable tracheostomy. Stable right PICC line. Increased bilateral pulmonary interstitial opacity since yesterday, a mildly asymmetrically worse in the left lung. Stable lung volumes. Stable cardiac size and mediastinal contours. No pneumothorax. Increased patchy and streaky bilateral infrahilar opacity. No large pleural effusion. IMPRESSION: 1. Interval increased bilateral pulmonary interstitial opacity and streaky medial lung base opacity. 2. Favor acute interstitial edema with atelectasis rather than acute viral/atypical respiratory infection. Electronically Signed   By: Odessa Fleming M.D.   On: 08/03/2017 06:55    Assessment/Plan: 1) Bilateral hydronephrosis: CT reviewed.  He has right hydroureteronephrosis down to the level of the bladder without any clear etiology. 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 He continues to make good UOP and is clearly not completely obstructed.  In addition, renal function continues to improve and is closer to baseline. He is not a good candidate for operative intervention with multiple medical comorbidities and current sepsis. I discussed option of nephrostomy tube placement vs continued observation with patient's daughter tonight.  While nephrostomy placement would allow evaluation to determine optimization of renal function, further evaluation of ? right renal hyperdensity, and would optimize drainage in light of UTI/infection.  The risks though are also significant and may result in increased risk of renal infection,  increased complexity with care chronically, and if a concerning finding is identified in the renal pelvis, this would require further surgical intervention and the risks associated with surgery and general anesthesia.  Ms. Durwin Nora is appropriately unsure of how she would like to proceed with pros and cons of each approach.  She is going to consider her options and we will discuss further tomorrow.  Considering that he appears to be improving and taking into account his significant medical problems, either option seems medically appropriate.  Would continue to hold anticoagulation while she decides.  Clear indications to proceed with nephrostomy placement would be worsening sepsis, worsening renal function, or development of pain.  2) Hyperdense material in right renal collecting system: It is unclear as to whether this is artifactual, blood product, fungal material, etc. in absence of contrasted study.  This finding was discussed and patient's daughter is considering nephrostomy placement.  If that is done, nephrostogram could be performed for further evaluation.   3) Yeast funguria: Unclear whether this is simply asymptomatic funguria or significant part of his infection/sepsis.  Will leave to discretion of CCM as to whether to treat.  Would be appropriate to consider repeating culture.  If patient has ureteral obstruction (unclear right now), he probably should consider being treated and will be more likely to require renal drainage to optimize recovery.   Pt's daughter, Rosine Beat, is closest relative. Phone number is [phone].      LOS: 3 days   Josiah Nieto,LES 08/04/2017, 6:27 PM

## 2017-08-04 NOTE — Progress Notes (Signed)
PHARMACY NOTE:  ANTIMICROBIAL RENAL DOSAGE ADJUSTMENT  Current antimicrobial regimen includes a mismatch between antimicrobial dosage and estimated renal function.  As per policy approved by the Pharmacy & Therapeutics and Medical Executive Committees, the antimicrobial dosage will be adjusted accordingly.  Current antimicrobial dosage:  Avycaz 0.94mg  IV q12h  Indication: Multi-drug resistant organisms  Renal Function: Continues to improve, Scr on admit 4.74, now 2.2  Estimated Creatinine Clearance: 36.1 mL/min (A) (by C-G formula based on SCr of 2.2 mg/dL (H)). []      On intermittent HD, scheduled: []      On CRRT    Antimicrobial dosage has been changed to:  Avycaz 1.25mg  IV q8h  Additional comments: ID is following and patient is also on linezolid 600mg  IV q12h.  Thank you for allowing pharmacy to be a part of this patient's care.  Donnella Biyler Joniya Boberg, [facility] Medical CenterRPH 08/04/2017 8:39 AM

## 2017-08-04 NOTE — Progress Notes (Signed)
Pt's temp 101.2 and Dr. Isaiah SergeMannam notified.

## 2017-08-05 ENCOUNTER — Inpatient Hospital Stay (HOSPITAL_COMMUNITY): Payer: Medicare Other

## 2017-08-05 LAB — BLOOD CULTURE ID PANEL (REFLEXED)
ACINETOBACTER BAUMANNII: NOT DETECTED
CANDIDA ALBICANS: NOT DETECTED
CANDIDA GLABRATA: DETECTED — AB
CANDIDA KRUSEI: NOT DETECTED
CANDIDA PARAPSILOSIS: NOT DETECTED
CANDIDA TROPICALIS: NOT DETECTED
ENTEROBACTERIACEAE SPECIES: NOT DETECTED
ENTEROCOCCUS SPECIES: NOT DETECTED
ESCHERICHIA COLI: NOT DETECTED
Enterobacter cloacae complex: NOT DETECTED
Haemophilus influenzae: NOT DETECTED
KLEBSIELLA PNEUMONIAE: NOT DETECTED
Klebsiella oxytoca: NOT DETECTED
LISTERIA MONOCYTOGENES: NOT DETECTED
Neisseria meningitidis: NOT DETECTED
Proteus species: NOT DETECTED
Pseudomonas aeruginosa: NOT DETECTED
STREPTOCOCCUS PYOGENES: NOT DETECTED
Serratia marcescens: NOT DETECTED
Staphylococcus aureus (BCID): NOT DETECTED
Staphylococcus species: NOT DETECTED
Streptococcus agalactiae: NOT DETECTED
Streptococcus pneumoniae: NOT DETECTED
Streptococcus species: NOT DETECTED

## 2017-08-05 LAB — BASIC METABOLIC PANEL
ANION GAP: 6 (ref 5–15)
BUN: 63 mg/dL — ABNORMAL HIGH (ref 6–20)
CALCIUM: 8.3 mg/dL — AB (ref 8.9–10.3)
CHLORIDE: 118 mmol/L — AB (ref 101–111)
CO2: 23 mmol/L (ref 22–32)
Creatinine, Ser: 1.66 mg/dL — ABNORMAL HIGH (ref 0.61–1.24)
GFR calc non Af Amer: 42 mL/min — ABNORMAL LOW (ref >60)
GFR, EST AFRICAN AMERICAN: 49 mL/min — AB (ref >60)
GLUCOSE: 107 mg/dL — AB (ref 65–99)
Potassium: 3.3 mmol/L — ABNORMAL LOW (ref 3.5–5.1)
Sodium: 147 mmol/L — ABNORMAL HIGH (ref 135–145)

## 2017-08-05 LAB — CBC
HEMATOCRIT: 24.6 % — AB (ref 39.0–52.0)
HEMOGLOBIN: 7.2 g/dL — AB (ref 13.0–17.0)
MCH: 23.3 pg — ABNORMAL LOW (ref 26.0–34.0)
MCHC: 29.3 g/dL — AB (ref 30.0–36.0)
MCV: 79.6 fL (ref 78.0–100.0)
Platelets: 181 10*3/uL (ref 150–400)
RBC: 3.09 MIL/uL — ABNORMAL LOW (ref 4.22–5.81)
RDW: 16.9 % — AB (ref 11.5–15.5)
WBC: 10.2 10*3/uL (ref 4.0–10.5)

## 2017-08-05 LAB — GLUCOSE, CAPILLARY
GLUCOSE-CAPILLARY: 89 mg/dL (ref 65–99)
GLUCOSE-CAPILLARY: 98 mg/dL (ref 65–99)
Glucose-Capillary: 101 mg/dL — ABNORMAL HIGH (ref 65–99)
Glucose-Capillary: 113 mg/dL — ABNORMAL HIGH (ref 65–99)
Glucose-Capillary: 111 mg/dL — ABNORMAL HIGH (ref 65–99)

## 2017-08-05 LAB — PHOSPHORUS: PHOSPHORUS: 2.3 mg/dL — AB (ref 2.5–4.6)

## 2017-08-05 LAB — MAGNESIUM: Magnesium: 1.7 mg/dL (ref 1.7–2.4)

## 2017-08-05 MED ORDER — SENNOSIDES-DOCUSATE SODIUM 8.6-50 MG PO TABS
1.0000 | ORAL_TABLET | Freq: Every day | ORAL | Status: DC
  Administered 2017-08-06 – 2017-08-19 (×9): 1
  Filled 2017-08-05 (×16): qty 1

## 2017-08-05 MED ORDER — POTASSIUM PHOSPHATES 15 MMOLE/5ML IV SOLN
20.0000 mmol | Freq: Once | INTRAVENOUS | Status: AC
  Administered 2017-08-05: 20 mmol via INTRAVENOUS
  Filled 2017-08-05: qty 6.67

## 2017-08-05 MED ORDER — SODIUM CHLORIDE 0.9 % IV SOLN
100.0000 mg | INTRAVENOUS | Status: AC
  Administered 2017-08-06 – 2017-08-19 (×14): 100 mg via INTRAVENOUS
  Filled 2017-08-05 (×15): qty 100

## 2017-08-05 MED ORDER — FREE WATER
250.0000 mL | Status: DC
  Administered 2017-08-05 – 2017-08-08 (×16): 250 mL

## 2017-08-05 MED ORDER — SODIUM CHLORIDE 0.9 % IV SOLN
200.0000 mg | Freq: Once | INTRAVENOUS | Status: AC
  Administered 2017-08-05: 200 mg via INTRAVENOUS
  Filled 2017-08-05: qty 200

## 2017-08-05 NOTE — Progress Notes (Signed)
PULMONARY / CRITICAL CARE MEDICINE   Name: Craig Oneal. MRN: [id number] DOB: 1951/02/26    ADMISSION DATE:  08/01/2017 CONSULTATION DATE:  08/01/2017  REFERRING MD:  Dr. Fayrene Fearing  CHIEF COMPLAINT:  Fever, AMS  HISTORY OF PRESENT ILLNESS:  HPI obtained from medical chart review and from daughter at bedside as patient has acute encephalopathy on mechanical ventilator.   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 Blood cultures sent and started on cefepime 11/26 and amikacin 11/27.  Additionally, daughter reports patient normally requires ventilator at night normally but has required continously for the last two days.    Patient has been at [facility] since February 2018; prior to was at [facility].  Hospitalized at [facility] in 01/2017 with proteus bacteremia, septic and hemorrhagic shock.  Additionally has history of serratia and pseudomonas from tracheal aspirate in July, both sensitive to Cefepime, as well as enterobacter and morganella.  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    In ER, initial labs noted for K 6.5, BUN 213, sCr 4.74 (   ), WBC 17, Hgb 8.3, BNP 316, lactic acid 1.2 -> 0.82,  EKG with afib rate 132, CXR with bilateral infiltrates versus edema.  Normotensive in ER.  Treated with sepsis protocol with 2L NS bolus, vancomycin and cefepime given after blood cultures, and lopressor once for HR control.  PCCM to admit patient.     SUBJECTIVE:  No distress  VITAL SIGNS: BP (!) 112/48   Pulse (!) 44   Temp 99.7 F (37.6 C) (Oral)   Resp (!) 31   Ht 5\' 11"  (1.803 m)   Wt 172 lb 9.9 oz (78.3 kg)   SpO2 94%   BMI 24.08 kg/m    HEMODYNAMICS: CVP:  [7 mmHg-12 mmHg] 12 mmHg  VENTILATOR SETTINGS: Vent Mode: Stand-by FiO2 (%):  [35 %-40 %] 40 % Set Rate:  [24 bmp] 24 bmp Vt Set:  [500 mL] 500 mL PEEP:  [5 cmH20] 5 cmH20 Plateau Pressure:  [17 cmH20-22 cmH20] 17 cmH20  INTAKE / OUTPUT: I/O last 3 completed shifts: In: 3971.4 [I.V.:871.4; NG/GT:2450; IV Piggyback:650] Out: 2785 [Urine:2785]  PHYSICAL EXAMINATION: Gen:      Resting on ATC, no distress HEENT:  EOMI, non-icteric. PERRL Neck:     Trach unremarkable  Lungs:    Scattered rhonchi. No accessory use on ATC CV:         Regular regular no MRG Abd:      Soft, not tender PEG unremarkable  Ext:    No edema; adequate peripheral perfusion Skin:       Warm and dry  Neuro:    Awake, interactive. Does not move.  LABS:  BMET Recent Labs  Lab 08/03/17 1200 08/04/17 0444 08/05/17 0533  NA 143 145 147*  K 3.6 3.4* 3.3*  CL 116* 118* 118*  CO2 20* 21* 23  BUN 102* 89* 63*  CREATININE 2.41* 2.20* 1.66*  GLUCOSE 127* 155* 107*    Electrolytes Recent Labs  Lab 08/03/17 1200  08/04/17 0444 08/04/17 1606 08/05/17 0533  CALCIUM 8.8*  --  8.6*  --  8.3*  MG  --    < > 2.0 1.7 1.7  PHOS 3.9   < > 2.9 2.9 2.3*   < > = values in this interval not displayed.    CBC Recent Labs  Lab 08/03/17 0334 08/04/17 0444 08/05/17 0533  WBC 15.2* 11.3* 10.2  HGB 8.0* 7.5* 7.2*  HCT 27.0* 25.8* 24.6*  PLT 159 159 181    Coag's Recent Labs  Lab 08/01/17 2146  APTT 21*  INR 1.18    Sepsis Markers Recent Labs  Lab 08/01/17 1743 08/01/17 1939 08/02/17 1130  LATICACIDVEN 1.20 0.82  --   PROCALCITON  --   --  7.71    ABG Recent Labs  Lab 08/01/17 2144  PHART 7.386  PCO2ART 36.8  PO2ART 84.0    Liver Enzymes Recent Labs  Lab 08/01/17 1730 08/02/17 1130 08/03/17 1200  AST 55* 39  --   ALT 71* 43  --   ALKPHOS 229* 153*  --   BILITOT 0.6 0.5  --   ALBUMIN 2.3* 1.6* 1.6*    Cardiac Enzymes No results for input(s): TROPONINI,  PROBNP in the last 168 hours.  Glucose Recent Labs  Lab 08/04/17 1155 08/04/17 1610 08/04/17 2023 08/04/17 2355 08/05/17 0413 08/05/17 0831  GLUCAP 120* 74 112* 101* 89 101*    Imaging Ct Abdomen Pelvis Wo Contrast  Result Date: 08/04/2017 CLINICAL DATA:  Multisystem organ failure. Recent gastrostomy tube placement. Hydronephrosis seen on recent CT scan from 2 days ago. EXAM: CT ABDOMEN AND PELVIS WITHOUT CONTRAST TECHNIQUE: Multidetector CT imaging of the abdomen and pelvis was performed following the standard protocol without IV contrast. COMPARISON:  CT scan 08/02/2017 FINDINGS: Lower chest: There are small bilateral pleural effusions and bibasilar atelectasis. The heart is enlarged. No definite pericardial effusion. The distal esophagus is grossly normal. Hepatobiliary: No focal hepatic lesions. Numerous small layering gallstones are noted in the gallbladder but no definite findings for acute cholecystitis. No common bile duct dilatation. Pancreas: No definite mass, inflammation or ductal dilatation. Spleen: Normal size.  No focal lesions. Adrenals/Urinary Tract: The adrenal glands are unremarkable. Right-sided hydroureteronephrosis all the way down to the bladder without definite obstructing calculus or mass. The bladder is decompressed by Foley catheter. There is an extra renal pelvis noted on left side but no definite hydronephrosis and no left-sided hydroureter. Stomach/Bowel: The feeding gastrostomy tube is in good position. No complicating features are identified. The small bowel and colon are grossly normal. No mass or obstruction. Vascular/Lymphatic: Advanced atherosclerotic calcifications involving the aorta and iliac arteries but no aneurysm. Small scattered mesenteric and retroperitoneal lymph nodes but no mass or overt adenopathy. Reproductive: The prostate gland and seminal vesicles are unremarkable. Other: Small scattered pelvic lymph nodes without overt adenopathy. No inguinal  mass or adenopathy. Musculoskeletal: No significant bony findings. IMPRESSION: 1. The feeding gastrostomy tube appears be in good position without complicating features. 2. Small bilateral pleural effusions and bibasilar atelectasis. 3. Cholelithiasis without definite CT findings for acute cholecystitis. 4. Right-sided hydroureteronephrosis without obvious cause. No distal obstructing ureteral calculus or obvious bladder lesion. The bladder is decompressed by a Foley catheter. 5. Small amount of free abdominal and free pelvic fluid. Electronically Signed   By: Rudie MeyerP.  Gallerani M.D.   On: 08/04/2017 16:14   Dg Chest Port 1 View  Result Date: 08/05/2017 CLINICAL DATA:  Acute respiratory failure EXAM: PORTABLE CHEST 1 VIEW COMPARISON:  08/03/2017 FINDINGS: Tracheostomy and right PICC line remain in place, unchanged. Cardiomegaly. Vascular congestion. Continued bilateral perihilar and lower lobe opacities, left greater than right, slightly improved on the  right since prior study. This likely reflects improving edema. IMPRESSION: Cardiomegaly. Suspect mild improvement in pulmonary edema pattern with mild residual edema/CHF. Electronically Signed   By: Charlett NoseKevin  Dover M.D.   On: 08/05/2017 09:22   STUDIES:  11/26 CXR >> bilateral infiltrates vs edema 11/27 CXR >> stable bilateral opacities TTE >> obtained 11/27: Mild increased left ventricular hypertrophy.  Systolic function was mildly reduced at 40-45% with global hypokinesis and inferior basal akinesis.  The right ventricle was mildly dilated but systolic function normal.  The right atrium was mildly dilated there is moderate tricuspid valve regurg.  There are no echocardiograms on file to compare CT chest and abdomen obtained.  11/27: Percutaneous gastrostomy tube retracted into the abdominal wall.  Radiologist reports what appears to be mature tract to the gastric lumen given oral contrast injected through catheter did not extravasate.  There is no  pneumoperitoneum or fluid collection.  Advanced bilateral hydronephrosis with upper hydroureter.  High density in the right renal collecting system consistent with hemorrhage.  Left greater than right lower lobe airspace disease. CULTURES: Prior cultures at [facility] 11/23 ? 11/26 BCx 2: gpc clusters>>> 11/26 trach aspirate: Abundant WBC, rare gram-positive rods, moderate gram negative rods 11/26 UC >> 11/26: blood culture RID: + MRSA  ANTIBIOTICS: ? Prior Amikacin >>11/27 11/26 Cefepime (at [facility]) >>11/27 11/26 Vancomycin >>11/27 Linezolid 11/27>>>11/19 Ceftaz/avibactam 11/27>>>  SIGNIFICANT EVENTS: 11/26 Admit  LINES/TUBES: R PICC from [facility] -  Gastrostomy tube Foley>> changed 11/26 >>  DISCUSSION: [id number] yoM presenting from [facility] with AMS, fever, and acute on chronic respiratory failure since 11/23 admitted for sepsis and AKI.   ASSESSMENT / PLAN:  PULMONARY A: Acute on chronic respiratory failure  Likely HCAP +/- pulmonary edema  Weaning on ATC P:   Cont day time ATC and night time vent Suction PRN Repeat am cxr  CARDIOVASCULAR A:  Afib (was on warfarin prior) Hx HFrEF- prior TTE 08/2016 45-50% - BNP 316 P:  Hold warfarin Cont cardiazem  Cont tele   RENAL A:   AKI on CKD3- still making urine; has bilateral hydronephrosis, hematuria, and what appears to be right renal collecting system hemorrhage Chronic foley Hypernatremia -His creatinine has improved, may need left nephrostomy tube but doubt  -His Foley catheter was changed on admission on 11/27 P:   Avoid hypotension Holding ac Renal dose meds Keep euvolemic  F/u am chemistry  resume free water  Further recs per Urology   GASTROINTESTINAL A:   NPO Gtube-->malpositioned > replaced by IR Transaminitis - appears chronic based on May 2018 labs P:   Cont tubefeeds  HEMATOLOGIC A:   Anemia Acute blood loss anemia presumably from area noted on CT scan from right renal collecting system noting  concern for hemorrhage -Status post 2 units of PRBCs on 11/27. -->slowly drifting down  P:  Holding a/c Cont LE SCDs Serial CBC Transfuse per protocol    INFECTIOUS A:   Sepsis: Multiple sources: Pseudomonas PNA (sensitivities pending), Fungemia w/ candida glabrata, funguria  & possible osteo H/o CRE per [facility] P:   Antimicrobials per infectious disease; now day # 5 & day #2 anidulafungin  Needs line out (ordered); leave out over the weekend Repeat Blood culture Saturday  Final recs likely Monday re: course of therapy for antifungals/antimicrobials  Needs optho consult for Endopthalmitis   ENDOCRINE A:   Hypothyroidism  P:   Cont synthroid   NEUROLOGIC A:   Acute encephalopathy- ddx infection/sepsis vs uremic  Hx cerebral atrophy, paraplegia w/ataxia P:  Cont supportive care   FAMILY  - Updates: Daughter update at bedside 11/28 - Inter-disciplinary family meet or Palliative Care meeting due by: 08/08/2017   My ccm time 40 minutes Simonne Martinet ACNP-BC [facility] Pulmonary/Critical Care Pager # [phone] OR # [phone] if no answer

## 2017-08-05 NOTE — Consult Note (Signed)
Reason for Consult:Pt w/ Fungemia R/O Endophthalmitis Referring Physician: Ezzard Standing. is an 66 y.o. male.  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 Blood culture sent on 11/27 and pt started on cefepime and amikacin. Blood cultures grew GPC, trach- wbc, gram + rods, moderate gram - rods, +MRSA.   Past Medical History:  Diagnosis Date  . A-fib ([place])   . Hx of AKA (above knee amputation), right ([place])   . Tracheostomy in place [facility])     Past Surgical History:  Procedure Laterality Date  . IR [place] GASTRO/COLONIC TUBE PERCUT W/FLUORO  08/03/2017    No family history on file.  Social History:  has no tobacco, alcohol, and drug history on file.  Allergies: No Known Allergies  Medications:  Prior to Admission:  Medications Prior to Admission  Medication Sig Dispense Refill Last Dose  . acetaminophen (TYLENOL) 325 MG tablet Place 650 mg into feeding tube every 6 (six) hours as needed for mild pain (temp greater than than 100.6).    unknown  . aluminum-magnesium hydroxide 200-200 MG/5ML suspension Place 30 mLs into feeding tube every 6 (six) hours as needed (gas).    unknown  . amikacin (AMIKIN) IVPB Inject 100 mg into the vein daily at 6 (six) AM. Order date 08/02/17 - 14 day course for UTI   not yet given  . Amino Acids-Protein Hydrolys (FEEDING SUPPLEMENT, PRO-STAT SUGAR FREE 64,) LIQD Take 30 mLs by mouth 2 (two) times daily.   08/01/2017 at 900  . atorvastatin (LIPITOR) 10 MG tablet Place 10 mg into feeding tube at bedtime.    07/31/2017 at 2200  . baclofen (LIORESAL) 10 MG tablet Place 0.5 tablets (5 mg total) into feeding tube 2 (two) times daily.   08/01/2017 at 900  . Cefepime HCl 2 GM/100ML SOLN Inject 2 g into the vein 2 (two) times daily. Order date 08/01/17: 14 day course for UTI   not yet given  .  chlorhexidine (PERIDEX) 0.12 % solution Use as directed 15 mLs in the mouth or throat 2 (two) times daily.    08/01/2017 at 900  . clonazePAM (KLONOPIN) 0.5 MG tablet Place 0.25 mg into feeding tube at bedtime.    07/31/2017 at 2200  . Darbepoetin Alfa 25 MCG/ML SOLN Inject 25 mcg as directed every 28 (twenty-eight) days.    07/11/2017 at 900  . docusate sodium (COLACE) 100 MG capsule 100 mg 2 (two) times daily.   08/01/2017 at 900  . ferrous sulfate 300 (60 Fe) MG/5ML syrup Place 5 mLs (300 mg total) into feeding tube daily with breakfast. (Patient taking differently: Place 300 mg into feeding tube 2 (two) times daily with a meal. ) 150 mL 3 08/01/2017 at 900  . fluticasone (FLONASE) 50 MCG/ACT nasal spray Place 2 sprays into both nostrils daily.    08/01/2017 at 900  . furosemide (LASIX) 20 MG tablet Place 20 mg into feeding tube every other day.   08/01/2017 at 900  . heparin 5000 UNIT/ML injection Inject 5,000 Units into the skin every 12 (twelve) hours. For prophylactic   08/01/2017 at 900  . ipratropium-albuterol (DUONEB) 0.5-2.5 (3) MG/3ML SOLN Take 3 mLs by nebulization every 2 (two) hours as needed. (Patient taking differently: Take 3 mLs by nebulization every 6 (six) hours. ) 360 mL  08/01/2017 at 1400  . lactulose ([place])  10 GM/15ML solution Place 20 g into feeding tube every 12 (twelve) hours. Hold for regular BMs X 3 days   08/01/2017 at 900  . lansoprazole (PREVACID) 30 MG capsule Place 30 mg into feeding tube daily before breakfast.   08/01/2017 at 630  . levothyroxine (SYNTHROID, LEVOTHROID) 125 MCG tablet Place 125 mcg into feeding tube daily before breakfast.   08/01/2017 at 630  . loratadine (CLARITIN) 10 MG tablet Place 10 mg into feeding tube daily.    08/01/2017 at 900  . metoprolol tartrate (LOPRESSOR) 25 MG tablet Place 0.5 tablets (12.5 mg total) into feeding tube 2 (two) times daily.   08/01/2017 at 900  . montelukast (SINGULAIR) 10 MG tablet Place 10 mg into feeding tube  at bedtime.    07/31/2017 at 2100  . Multiple Vitamin (MULTIVITAMIN WITH MINERALS) TABS tablet Place 1 tablet into feeding tube daily.    08/01/2017 at 900  . Nutritional Supplements (NEPRO) LIQD Place 1,200 mLs into feeding tube See admin instructions. 60 m./hr via pump per G-tube - start time 10am, run until 0600 or until total volume of 1200 ml is infused   08/01/2017 at 1000  . oxyCODONE (OXY IR/ROXICODONE) 5 MG immediate release tablet Take 5-10 mg by mouth every 6 (six) hours as needed (5 mg for moderate pain, 10 mg for severe pain).   07/29/2017 at 0449  . polyethylene glycol (MIRALAX / GLYCOLAX) packet Place 17 g into feeding tube every 12 (twelve) hours.    08/01/2017 at 900  . potassium chloride (KLOR-CON) 20 MEQ packet Place 20 mEq into feeding tube every 8 (eight) hours.   08/01/2017 at 600  . sodium phosphate (FLEET) 7-19 GM/118ML ENEM Place 1 enema rectally daily as needed for severe constipation (constipation).   07/16/2017  . STERILE WATER PO Place 200 mLs into feeding tube every 4 (four) hours.   08/01/2017 at day shift  . Vitamin D, Ergocalciferol, (DRISDOL) 50000 units CAPS capsule Place 50,000 Units into feeding tube every Friday.    07/29/2017 at 900  . Nutritional Supplements (FEEDING SUPPLEMENT, VITAL HIGH PROTEIN,) LIQD liquid Place 1,000 mLs into feeding tube daily. (Patient not taking: Reported on 08/01/2017)   Not Taking at Unknown time   Scheduled: . chlorhexidine gluconate (MEDLINE KIT)  15 mL Mouth Rinse BID  . diltiazem  30 mg Per Tube Q6H  . free water  250 mL Per Tube Q4H  . heparin subcutaneous  5,000 Units Subcutaneous Q8H  . ipratropium-albuterol  3 mL Nebulization Q6H  . levothyroxine  125 mcg Per Tube QAC breakfast  . mouth rinse  15 mL Mouth Rinse QID  . pantoprazole sodium  40 mg Per Tube Daily   Continuous: . [START ON 08/06/2017] anidulafungin    . ceftazidime avibactam (AVYCAZ) IVPB Stopped (08/05/17 3149)  . feeding supplement (VITAL AF 1.2 CAL)  1,000 mL (08/05/17 0700)  . potassium PHOSPHATE IVPB (mmol) 20 mmol (08/05/17 1235)  . sodium chloride      Results for orders placed or performed during the hospital encounter of 08/01/17 (from the past 48 hour(s))  Magnesium     Status: None   Collection Time: 08/03/17  2:50 PM  Result Value Ref Range   Magnesium 1.9 1.7 - 2.4 mg/dL  Phosphorus     Status: None   Collection Time: 08/03/17  2:50 PM  Result Value Ref Range   Phosphorus 3.6 2.5 - 4.6 mg/dL  Culture, blood (Routine X 2) w Reflex to ID Panel  Status: None (Preliminary result)   Collection Time: 08/03/17  3:16 PM  Result Value Ref Range   Specimen Description BLOOD LEFT HAND    Special Requests IN PEDIATRIC BOTTLE Blood Culture adequate volume    Culture NO GROWTH < 24 HOURS    Report Status PENDING   Culture, blood (Routine X 2) w Reflex to ID Panel     Status: None (Preliminary result)   Collection Time: 08/03/17  3:28 PM  Result Value Ref Range   Specimen Description BLOOD LEFT HAND    Special Requests IN PEDIATRIC BOTTLE Blood Culture adequate volume    Culture NO GROWTH < 24 HOURS    Report Status PENDING   Glucose, capillary     Status: Abnormal   Collection Time: 08/03/17  5:08 PM  Result Value Ref Range   Glucose-Capillary 119 (H) 65 - 99 mg/dL   Comment 1 Venous Specimen   Magnesium     Status: None   Collection Time: 08/03/17  5:24 PM  Result Value Ref Range   Magnesium 1.9 1.7 - 2.4 mg/dL  Phosphorus     Status: None   Collection Time: 08/03/17  5:24 PM  Result Value Ref Range   Phosphorus 3.7 2.5 - 4.6 mg/dL  Glucose, capillary     Status: Abnormal   Collection Time: 08/03/17  8:14 PM  Result Value Ref Range   Glucose-Capillary 125 (H) 65 - 99 mg/dL   Comment 1 Capillary Specimen   Glucose, capillary     Status: Abnormal   Collection Time: 08/04/17 12:09 AM  Result Value Ref Range   Glucose-Capillary 136 (H) 65 - 99 mg/dL   Comment 1 Capillary Specimen    Comment 2 Notify RN   Glucose,  capillary     Status: Abnormal   Collection Time: 08/04/17  4:06 AM  Result Value Ref Range   Glucose-Capillary 127 (H) 65 - 99 mg/dL   Comment 1 Capillary Specimen    Comment 2 Notify RN   Basic metabolic panel     Status: Abnormal   Collection Time: 08/04/17  4:44 AM  Result Value Ref Range   Sodium 145 135 - 145 mmol/L   Potassium 3.4 (L) 3.5 - 5.1 mmol/L   Chloride 118 (H) 101 - 111 mmol/L   CO2 21 (L) 22 - 32 mmol/L   Glucose, Bld 155 (H) 65 - 99 mg/dL   BUN 89 (H) 6 - 20 mg/dL   Creatinine, Ser 2.20 (H) 0.61 - 1.24 mg/dL   Calcium 8.6 (L) 8.9 - 10.3 mg/dL   GFR calc non Af Amer 30 (L) >60 mL/min   GFR calc Af Amer 35 (L) >60 mL/min    Comment: (NOTE) The eGFR has been calculated using the CKD EPI equation. This calculation has not been validated in all clinical situations. eGFR's persistently <60 mL/min signify possible Chronic Kidney Disease.    Anion gap 6 5 - 15  CBC     Status: Abnormal   Collection Time: 08/04/17  4:44 AM  Result Value Ref Range   WBC 11.3 (H) 4.0 - 10.5 K/uL   RBC 3.27 (L) 4.22 - 5.81 MIL/uL   Hemoglobin 7.5 (L) 13.0 - 17.0 g/dL   HCT 25.8 (L) 39.0 - 52.0 %   MCV 78.9 78.0 - 100.0 fL   MCH 22.9 (L) 26.0 - 34.0 pg   MCHC 29.1 (L) 30.0 - 36.0 g/dL   RDW 16.7 (H) 11.5 - 15.5 %   Platelets 159 150 -  400 K/uL  Magnesium     Status: None   Collection Time: 08/04/17  4:44 AM  Result Value Ref Range   Magnesium 2.0 1.7 - 2.4 mg/dL  Phosphorus     Status: None   Collection Time: 08/04/17  4:44 AM  Result Value Ref Range   Phosphorus 2.9 2.5 - 4.6 mg/dL  Glucose, capillary     Status: Abnormal   Collection Time: 08/04/17  8:17 AM  Result Value Ref Range   Glucose-Capillary 116 (H) 65 - 99 mg/dL   Comment 1 Capillary Specimen    Comment 2 Notify RN   Glucose, capillary     Status: Abnormal   Collection Time: 08/04/17 11:55 AM  Result Value Ref Range   Glucose-Capillary 120 (H) 65 - 99 mg/dL   Comment 1 Capillary Specimen    Comment 2 Notify  RN   Magnesium     Status: None   Collection Time: 08/04/17  4:06 PM  Result Value Ref Range   Magnesium 1.7 1.7 - 2.4 mg/dL  Phosphorus     Status: None   Collection Time: 08/04/17  4:06 PM  Result Value Ref Range   Phosphorus 2.9 2.5 - 4.6 mg/dL  Glucose, capillary     Status: None   Collection Time: 08/04/17  4:10 PM  Result Value Ref Range   Glucose-Capillary 74 65 - 99 mg/dL   Comment 1 Capillary Specimen   Glucose, capillary     Status: Abnormal   Collection Time: 08/04/17  8:23 PM  Result Value Ref Range   Glucose-Capillary 112 (H) 65 - 99 mg/dL   Comment 1 Capillary Specimen    Comment 2 Notify RN   Glucose, capillary     Status: Abnormal   Collection Time: 08/04/17 11:55 PM  Result Value Ref Range   Glucose-Capillary 101 (H) 65 - 99 mg/dL   Comment 1 Capillary Specimen    Comment 2 Notify RN   Glucose, capillary     Status: None   Collection Time: 08/05/17  4:13 AM  Result Value Ref Range   Glucose-Capillary 89 65 - 99 mg/dL   Comment 1 Capillary Specimen   CBC     Status: Abnormal   Collection Time: 08/05/17  5:33 AM  Result Value Ref Range   WBC 10.2 4.0 - 10.5 K/uL   RBC 3.09 (L) 4.22 - 5.81 MIL/uL   Hemoglobin 7.2 (L) 13.0 - 17.0 g/dL   HCT 24.6 (L) 39.0 - 52.0 %   MCV 79.6 78.0 - 100.0 fL   MCH 23.3 (L) 26.0 - 34.0 pg   MCHC 29.3 (L) 30.0 - 36.0 g/dL   RDW 16.9 (H) 11.5 - 15.5 %   Platelets 181 150 - 400 K/uL  Basic metabolic panel     Status: Abnormal   Collection Time: 08/05/17  5:33 AM  Result Value Ref Range   Sodium 147 (H) 135 - 145 mmol/L   Potassium 3.3 (L) 3.5 - 5.1 mmol/L   Chloride 118 (H) 101 - 111 mmol/L   CO2 23 22 - 32 mmol/L   Glucose, Bld 107 (H) 65 - 99 mg/dL   BUN 63 (H) 6 - 20 mg/dL   Creatinine, Ser 1.66 (H) 0.61 - 1.24 mg/dL   Calcium 8.3 (L) 8.9 - 10.3 mg/dL   GFR calc non Af Amer 42 (L) >60 mL/min   GFR calc Af Amer 49 (L) >60 mL/min    Comment: (NOTE) The eGFR has been calculated using the CKD  EPI equation. This  calculation has not been validated in all clinical situations. eGFR's persistently <60 mL/min signify possible Chronic Kidney Disease.    Anion gap 6 5 - 15  Magnesium     Status: None   Collection Time: 08/05/17  5:33 AM  Result Value Ref Range   Magnesium 1.7 1.7 - 2.4 mg/dL  Phosphorus     Status: Abnormal   Collection Time: 08/05/17  5:33 AM  Result Value Ref Range   Phosphorus 2.3 (L) 2.5 - 4.6 mg/dL  Glucose, capillary     Status: Abnormal   Collection Time: 08/05/17  8:31 AM  Result Value Ref Range   Glucose-Capillary 101 (H) 65 - 99 mg/dL   Comment 1 Capillary Specimen    Comment 2 Notify RN   Glucose, capillary     Status: Abnormal   Collection Time: 08/05/17 12:06 PM  Result Value Ref Range   Glucose-Capillary 113 (H) 65 - 99 mg/dL   Comment 1 Capillary Specimen     Ct Abdomen Pelvis Wo Contrast  Result Date: 08/04/2017 CLINICAL DATA:  Multisystem organ failure. Recent gastrostomy tube placement. Hydronephrosis seen on recent CT scan from 2 days ago. EXAM: CT ABDOMEN AND PELVIS WITHOUT CONTRAST TECHNIQUE: Multidetector CT imaging of the abdomen and pelvis was performed following the standard protocol without IV contrast. COMPARISON:  CT scan 08/02/2017 FINDINGS: Lower chest: There are small bilateral pleural effusions and bibasilar atelectasis. The heart is enlarged. No definite pericardial effusion. The distal esophagus is grossly normal. Hepatobiliary: No focal hepatic lesions. Numerous small layering gallstones are noted in the gallbladder but no definite findings for acute cholecystitis. No common bile duct dilatation. Pancreas: No definite mass, inflammation or ductal dilatation. Spleen: Normal size.  No focal lesions. Adrenals/Urinary Tract: The adrenal glands are unremarkable. Right-sided hydroureteronephrosis all the way down to the bladder without definite obstructing calculus or mass. The bladder is decompressed by Foley catheter. There is an extra renal pelvis noted  on left side but no definite hydronephrosis and no left-sided hydroureter. Stomach/Bowel: The feeding gastrostomy tube is in good position. No complicating features are identified. The small bowel and colon are grossly normal. No mass or obstruction. Vascular/Lymphatic: Advanced atherosclerotic calcifications involving the aorta and iliac arteries but no aneurysm. Small scattered mesenteric and retroperitoneal lymph nodes but no mass or overt adenopathy. Reproductive: The prostate gland and seminal vesicles are unremarkable. Other: Small scattered pelvic lymph nodes without overt adenopathy. No inguinal mass or adenopathy. Musculoskeletal: No significant bony findings. IMPRESSION: 1. The feeding gastrostomy tube appears be in good position without complicating features. 2. Small bilateral pleural effusions and bibasilar atelectasis. 3. Cholelithiasis without definite CT findings for acute cholecystitis. 4. Right-sided hydroureteronephrosis without obvious cause. No distal obstructing ureteral calculus or obvious bladder lesion. The bladder is decompressed by a Foley catheter. 5. Small amount of free abdominal and free pelvic fluid. Electronically Signed   By: Marijo Sanes M.D.   On: 08/04/2017 16:14   Ir Replc Gastro/colonic Tube Percut W/fluoro  Result Date: 08/03/2017 INDICATION: Malpositioned gastrostomy tube. Please perform fluoroscopic guided exchange and repositioning. EXAM: FLUOROSCOPIC GUIDED REPLACEMENT OF GASTROSTOMY TUBE COMPARISON:  CT the chest, abdomen and pelvis - 08/02/2017 MEDICATIONS: None. CONTRAST:  20 cc Isovue 300 - administered into the gastric lumen FLUOROSCOPY TIME:  1 minute 54 seconds (297 mGy) COMPLICATIONS: None immediate. PROCEDURE: The upper abdomen and external portion of the existing gastrostomy tube was prepped and draped in the usual sterile fashion, and a sterile drape was applied  covering the operative field. Maximum barrier sterile technique with sterile gowns and gloves  were used for the procedure. A timeout was performed prior to the initiation of the procedure. The existing gastrostomy tube was injected with a small amount of contrast demonstrating a patent track between the end of the gastrostomy disc and the gastric lumen. The existing disc retention gastrostomy tube was removed intact. With the use of a Kumpe the catheter, a regular glidewire was advanced through the prior subcutaneous gastrostomy track to the level the gastric lumen. Contrast injection confirmed appropriate positioning. Next, over a short Amplatz wire, a new, slightly larger, 20 French balloon retention gastrostomy tube was inserted. The balloon was inflated and disc was cinched. Contrast was injected and several spot fluoroscopic images were obtained in various obliquities to confirm appropriate intraluminal positioning. A dressing was placed. The patient tolerated the procedure well without immediate postprocedural complication. IMPRESSION: Successful fluoroscopic guided replacement, repositioning and up sizing of now 20-French gastrostomy tube. The new gastrostomy tube is ready for immediate use. Electronically Signed   By: Sandi Mariscal M.D.   On: 08/03/2017 17:33   Dg Chest Port 1 View  Result Date: 08/05/2017 CLINICAL DATA:  Acute respiratory failure EXAM: PORTABLE CHEST 1 VIEW COMPARISON:  08/03/2017 FINDINGS: Tracheostomy and right PICC line remain in place, unchanged. Cardiomegaly. Vascular congestion. Continued bilateral perihilar and lower lobe opacities, left greater than right, slightly improved on the right since prior study. This likely reflects improving edema. IMPRESSION: Cardiomegaly. Suspect mild improvement in pulmonary edema pattern with mild residual edema/CHF. Electronically Signed   By: Rolm Baptise M.D.   On: 08/05/2017 09:22    Review of Systems  Unable to perform ROS: Mental acuity   Blood pressure (!) 105/54, pulse (!) 109, temperature 99.1 F (37.3 C), temperature source  Oral, resp. rate (!) 33, height _0  (1.803 m), weight 78.3 kg (172 lb 9.9 oz), SpO2 95 %. Physical Exam  Eyes: EOM and lids are normal. Right eye exhibits no chemosis, no discharge, no exudate and no hordeolum. No foreign body present in the right eye. Left eye exhibits no chemosis, no discharge, no exudate and no hordeolum. No foreign body present in the left eye. Right conjunctiva is not injected. Right conjunctiva has no hemorrhage. Left conjunctiva is not injected. Left conjunctiva has no hemorrhage. No scleral icterus. Right pupil is round and reactive. Left pupil is round and reactive. Pupils are equal.  Fundoscopic exam:      The right eye shows no arteriolar narrowing, no AV nicking, no exudate, no hemorrhage and no papilledema.       The left eye shows no arteriolar narrowing, no AV nicking, no exudate, no hemorrhage and no papilledema.  Slit lamp exam:      The right eye shows no corneal abrasion, no corneal flare, no corneal ulcer, no foreign body, no hyphema, no hypopyon and no anterior chamber bulge.       The left eye shows no corneal abrasion, no corneal flare, no corneal ulcer, no foreign body, no hyphema, no hypopyon and no anterior chamber bulge.  VA- unable to be assess because of mental state but patient able to follow my fingers in front of face to check for EOM IOP: 16 OD, 13 OS    Assessment/Plan: Sepsis - Eye exam wnl, fundus wnl, no endophthalmitis noted - if eye becomes painful/red/swollen please re-consult ophtho  - after discharge patient can be seen annually for a regular eye exam   Kempton Milne Ene  Leveon Pelzer 08/05/2017, 12:58 PM

## 2017-08-05 NOTE — Progress Notes (Signed)
eLink Physician-Brief Progress Note Patient Name: Craig DiegoCharlie J Crossland Jr. DOB: 1951-08-23 MRN: [id number]   Date of Service  08/05/2017  HPI/Events of Note  BCI from 08/01/17 positive for Candida glabrata.   eICU Interventions  Will add eraxis.        Va Broadwell 08/05/2017, 3:49 AM

## 2017-08-05 NOTE — Progress Notes (Signed)
       [facility]  Date of Admission:  08/01/2017     Total days of antibiotics 4   Ceftaz-Avibactam 11/27  Linezolid 11/27  Patient ID: Craig J Glomb Jr. is a 66 y.o. AA paraplegic male with chronic trach/ventilator dependence, PEG, foley from [facility]. Admitted with concern for HCAP, A/C CKD, anemia and fevers. 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 chlorhexidine gluconate (MEDLINE KIT)  15 mL Mouth Rinse BID  . diltiazem  30 mg Per Tube Q6H  . free water  250 mL Per Tube Q4H  . heparin subcutaneous  5,000 Units Subcutaneous Q8H  . ipratropium-albuterol  3 mL Nebulization Q6H  . levothyroxine  125 mcg Per Tube QAC breakfast  . mouth rinse  15 mL Mouth Rinse QID  . pantoprazole sodium  40 mg Per Tube Daily  . senna-docusate  1 tablet Per Tube QHS    SUBJECTIVE: Opens eyes and attempts to communicate with facial expression. Denies pain.   Review of Systems: Review of Systems  Unable to perform ROS: Patient nonverbal    No Known Allergies  OBJECTIVE: Vitals:   08/05/17 1214 08/05/17 1300 08/05/17 1318 08/05/17 1400  BP:  (!) 113/45  (!) 104/45  Pulse:  100  (!) 50  Resp:  (!) 24  (!) 26  Temp: 99.1 F (37.3 C)     TempSrc: Oral     SpO2:  96% 97% 96%  Weight:      Height:       Body mass index is 24.08 kg/m.  Physical Exam  Constitutional: He is oriented to person, place, and time and well-developed, well-nourished, and in no distress.  Sitting up in bed. Eyes open spontaneous. Appears comfortable and breathing well on trach collar.   HENT:  Mouth/Throat: Oropharynx is clear and moist. No oral lesions. Normal dentition. No dental caries.  Eyes: Pupils are equal, round, and reactive to light. No scleral icterus.  Cardiovascular: Normal  heart sounds. An irregularly irregular rhythm present. Tachycardia present.  Pulmonary/Chest: Effort normal and breath sounds normal. No tachypnea.  Abdominal: Soft. Bowel sounds are normal. He exhibits no distension. There is no tenderness.  Genitourinary:  Genitourinary Comments: Urine clear yellow. Foley in place.   Musculoskeletal: Normal range of motion.  Moves LEs only   Lymphadenopathy:    He has no cervical adenopathy.  Neurological: He is alert and oriented to person, place, and time.  Skin: Skin is warm and dry. No rash noted.  Psychiatric: Mood and affect normal.  Nonverbal     Lab Results Lab Results  Component Value Date   WBC 10.2 08/05/2017   HGB 7.2 (L) 08/05/2017   HCT 24.6 (L) 08/05/2017   MCV 79.6 08/05/2017   PLT 181 08/05/2017    Lab Results  Component Value Date   CREATININE 1.66 (H) 08/05/2017   BUN 63 (H) 08/05/2017   NA 147 (H) 08/05/2017   K 3.3 (L) 08/05/2017   CL 118 (H) 08/05/2017   CO2 23 08/05/2017    Lab Results  Component Value Date   ALT 43 08/02/2017   AST 39 08/02/2017   ALKPHOS 153 (H) 08/02/2017   BILITOT 0.5 08/02/2017     Microbiology: BCx 11/26 >> 1/2 sets + CoNS   species; Candida Glabrata  BCx 11/28 >> pending Trach Asp Cx >> moderate GNR, Rare GPR on GS; Few GNR on Cx growing  Urine Cx 11/26 >> 30k yeast   Assessment & Plan:  1. Acute on Chronic Respiratory Failure, HCAP = CT L > R LL pneumonia. Trach aspirate with few Pseudomonas  Improvement in pulmonary status --> tolerating TC much better today and more alert  Continue Avycaz for now until sensitivities  2. Encephalopathy = opens eyes to commands and appears to be at baseline regarding communication efforts.   3. Candidemia = off vasoactive medications today. PICC from [facility] still in place   MRSE likely contaminant  Anidulafungin started for C. Glabrata   Recommend to pull PICC line and give line holiday until next week if able.   TTE no evidence of IE -->  would not at this point pursue TEE (d/w PCCM and agree)  Recommend ophthalmology exam to r/o endophthalmitis   Repeat BCx in AM   4. Acute on Chronic CKD, Hematuria = creatine improving. Clear yellow urine   May need nephrostomy tubes --> urology following   , MSN, NP-C [facility] Cell: [phone] Pager: [phone]  08/05/2017  3:12 PM    

## 2017-08-05 NOTE — Progress Notes (Signed)
PHARMACY - PHYSICIAN COMMUNICATION CRITICAL VALUE ALERT - BLOOD CULTURE IDENTIFICATION (BCID)  Craig J Toney [name] is an 66 y.o. male who [facility] on 08/01/2017 with a chief complaint of sepsis.  Assessment:  66 yo male from [facility] with concern for sepsis/HCAP. Followed by ID and on abx as below. Tmax 101.2 and WBC 11.3. Off pressors 11/29 AM. Blood cultures from 11/26 - previously reported staph species with meth resistance, now also showing yeast in 1/2 bottles. BCID as below.   Name of physician (or Provider) Contacted: Dr. Craige CottaSood   Current antibiotics: Ceftazidime-avibactam 1.25g IV q8hr   Changes to prescribed antibiotics recommended: Anidulafungin 200mg  IV x1, then 100mg  IV q24hr.  Recommendations accepted by provider  Results for orders placed or performed during the hospital encounter of 08/01/17  Blood Culture ID Panel (Reflexed) (Collected: 08/01/2017  5:22 PM)  Result Value Ref Range   Enterococcus species NOT DETECTED NOT DETECTED   Listeria monocytogenes NOT DETECTED NOT DETECTED   Staphylococcus species NOT DETECTED NOT DETECTED   Staphylococcus aureus NOT DETECTED NOT DETECTED   Streptococcus species NOT DETECTED NOT DETECTED   Streptococcus agalactiae NOT DETECTED NOT DETECTED   Streptococcus pneumoniae NOT DETECTED NOT DETECTED   Streptococcus pyogenes NOT DETECTED NOT DETECTED   Acinetobacter baumannii NOT DETECTED NOT DETECTED   Enterobacteriaceae species NOT DETECTED NOT DETECTED   Enterobacter cloacae complex NOT DETECTED NOT DETECTED   Escherichia coli NOT DETECTED NOT DETECTED   Klebsiella oxytoca NOT DETECTED NOT DETECTED   Klebsiella pneumoniae NOT DETECTED NOT DETECTED   Proteus species NOT DETECTED NOT DETECTED   Serratia marcescens NOT DETECTED NOT DETECTED   Haemophilus influenzae NOT DETECTED NOT DETECTED   Neisseria meningitidis NOT DETECTED NOT DETECTED   Pseudomonas aeruginosa NOT DETECTED NOT DETECTED   Candida albicans NOT DETECTED  NOT DETECTED   Candida glabrata DETECTED (A) NOT DETECTED   Candida krusei NOT DETECTED NOT DETECTED   Candida parapsilosis NOT DETECTED NOT DETECTED   Candida tropicalis NOT DETECTED NOT DETECTED    Craig Oneal, PharmD Clinical Pharmacist 08/05/17 3:43 AM

## 2017-08-05 NOTE — Progress Notes (Signed)
[facility] for Infectious Disease    Date of Admission:  08/01/2017   Total days of antibiotics 5        Day 5 avycaz        Day 2 anidulafungin           ID: Craig Oneal. is a 66 y.o. male with chronic trach/ventilator dependence, PEG, foley from [facility]. Admitted with concern for HCAP, A/C CKD, anemia and fevers. 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 chlorhexidine gluconate (MEDLINE KIT)  15 mL Mouth Rinse BID  . diltiazem  30 mg Per Tube Q6H  . free water  250 mL Per Tube Q4H  . heparin subcutaneous  5,000 Units Subcutaneous Q8H  . ipratropium-albuterol  3 mL Nebulization Q6H  . levothyroxine  125 mcg Per Tube QAC breakfast  . mouth rinse  15 mL Mouth Rinse QID  . pantoprazole sodium  40 mg Per Tube Daily  . senna-docusate  1 tablet Per Tube QHS    Objective: Vital signs in last 24 hours: Temp:  [98.5 F (36.9 C)-99.7 F (37.6 C)] 99.1 F (37.3 C) (11/30 1214) Pulse Rate:  [41-111] 50 (11/30 1400) Resp:  [23-33] 26 (11/30 1400) BP: (87-117)/(45-67) 104/45 (11/30 1400) SpO2:  [91 %-100 %] 96 % (11/30 1400) FiO2 (%):  [40 %] 40 % (11/30 1318) Weight:  [172 lb 9.9 oz (78.3 kg)] 172 lb 9.9 oz (78.3 kg) (11/30 0500) Physical Exam  Constitutional: He is oriented to person, somewhat responds to yes/no questions. He appears well-developed and well-nourished. No distress.  HENT:  Mouth/Throat: Oropharynx is clear and moist. No oropharyngeal exudate.  Neck - trach in place Cardiovascular: Normal rate, regular rhythm and normal heart sounds. Exam reveals no gallop and no friction rub.  No murmur heard.    Pulmonary/Chest: Effort normal and breath sounds normal. No respiratory distress. He has no wheezes.  Abdominal: Soft. Bowel sounds are normal. He exhibits no distension. There is no tenderness. Peg in place.  Neurological: He is alert and oriented to person, place, and time.  Ext: right lower leg amputation  Some staples still in place. Right arm picc line in place Skin: Skin is warm and dry. No rash noted. No erythema.     Lab Results Recent Labs    08/04/17 0444 08/05/17 0533  WBC 11.3* 10.2  HGB 7.5* 7.2*  HCT 25.8* 24.6*  NA 145 147*  K 3.4* 3.3*  CL 118* 118*  CO2 21* 23  BUN 89* 63*  CREATININE 2.20* 1.66*   Liver Panel Recent Labs    08/03/17 1200  ALBUMIN 1.6*    Microbiology: 11/26 blood cx 1 of 2 + c.glabrata (growth on day 3) 11/28 blood cx ngtd  Studies/Results: Ct Abdomen Pelvis Wo Contrast  Result Date: 08/04/2017 CLINICAL DATA:  Multisystem organ failure. Recent gastrostomy tube placement. Hydronephrosis seen on recent CT scan from 2 days ago. EXAM: CT ABDOMEN AND PELVIS WITHOUT CONTRAST TECHNIQUE: Multidetector CT imaging of the abdomen and pelvis was performed following the standard protocol without IV contrast. COMPARISON:  CT scan  08/02/2017 FINDINGS: Lower chest: There are small bilateral pleural effusions and bibasilar atelectasis. The heart is enlarged. No definite pericardial effusion. The distal esophagus is grossly normal. Hepatobiliary: No focal hepatic lesions. Numerous small layering gallstones are noted in the gallbladder but no definite findings for acute cholecystitis. No common bile duct dilatation. Pancreas: No definite mass, inflammation or ductal dilatation. Spleen: Normal size.  No focal lesions. Adrenals/Urinary Tract: The adrenal glands are unremarkable. Right-sided hydroureteronephrosis all the way down to the bladder without definite obstructing calculus or mass. The bladder is decompressed by Foley catheter. There is an extra renal  pelvis noted on left side but no definite hydronephrosis and no left-sided hydroureter. Stomach/Bowel: The feeding gastrostomy tube is in good position. No complicating features are identified. The small bowel and colon are grossly normal. No mass or obstruction. Vascular/Lymphatic: Advanced atherosclerotic calcifications involving the aorta and iliac arteries but no aneurysm. Small scattered mesenteric and retroperitoneal lymph nodes but no mass or overt adenopathy. Reproductive: The prostate gland and seminal vesicles are unremarkable. Other: Small scattered pelvic lymph nodes without overt adenopathy. No inguinal mass or adenopathy. Musculoskeletal: No significant bony findings. IMPRESSION: 1. The feeding gastrostomy tube appears be in good position without complicating features. 2. Small bilateral pleural effusions and bibasilar atelectasis. 3. Cholelithiasis without definite CT findings for acute cholecystitis. 4. Right-sided hydroureteronephrosis without obvious cause. No distal obstructing ureteral calculus or obvious bladder lesion. The bladder is decompressed by a Foley catheter. 5. Small amount of free abdominal and free pelvic fluid. Electronically Signed   By: Marijo Sanes M.D.   On: 08/04/2017 16:14   Ir Replc Gastro/colonic Tube Percut W/fluoro  Result Date: 08/03/2017 INDICATION: Malpositioned gastrostomy tube. Please perform fluoroscopic guided exchange and repositioning. EXAM: FLUOROSCOPIC GUIDED REPLACEMENT OF GASTROSTOMY TUBE COMPARISON:  CT the chest, abdomen and pelvis - 08/02/2017 MEDICATIONS: None. CONTRAST:  20 cc Isovue 300 - administered into the gastric lumen FLUOROSCOPY TIME:  1 minute 54 seconds (334 mGy) COMPLICATIONS: None immediate. PROCEDURE: The upper abdomen and external portion of the existing gastrostomy tube was prepped and draped in the usual sterile fashion, and a sterile drape was applied covering the operative field. Maximum barrier sterile technique with sterile gowns  and gloves were used for the procedure. A timeout was performed prior to the initiation of the procedure. The existing gastrostomy tube was injected with a small amount of contrast demonstrating a patent track between the end of the gastrostomy disc and the gastric lumen. The existing disc retention gastrostomy tube was removed intact. With the use of a Kumpe the catheter, a regular glidewire was advanced through the prior subcutaneous gastrostomy track to the level the gastric lumen. Contrast injection confirmed appropriate positioning. Next, over a short Amplatz wire, a new, slightly larger, 20 French balloon retention gastrostomy tube was inserted. The balloon was inflated and disc was cinched. Contrast was injected and several spot fluoroscopic images were obtained in various obliquities to confirm appropriate intraluminal positioning. A dressing was placed. The patient tolerated the procedure well without immediate postprocedural complication. IMPRESSION: Successful fluoroscopic guided replacement, repositioning and up sizing of now 20-French gastrostomy tube. The new gastrostomy tube is ready for immediate use. Electronically Signed   By: Sandi Mariscal M.D.   On: 08/03/2017 17:33   Dg Chest Port 1 View  Result Date: 08/05/2017 CLINICAL DATA:  Acute respiratory failure EXAM: PORTABLE CHEST 1 VIEW COMPARISON:  08/03/2017 FINDINGS: Tracheostomy and right PICC line remain in place, unchanged. Cardiomegaly. Vascular congestion. Continued bilateral perihilar and  lower lobe opacities, left greater than right, slightly improved on the right since prior study. This likely reflects improving edema. IMPRESSION: Cardiomegaly. Suspect mild improvement in pulmonary edema pattern with mild residual edema/CHF. Electronically Signed   By: Rolm Baptise M.D.   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 Await sensitivities to change to either cefepime or ceftaz. Plan to treat for a  total of 14d. Currently on day 5 of treatment  Fungemia = thought to be possibly line related. Recommend to remove picc line. Please give line holiday (of 48-72hr) to ensure no longer fungemic. Repeat blood cx on 12/1. Please get ophtho exam to rule out endo-ophthalmitis. TTE did not suggests endocarditis, though less sensitive. Would be at risk for aspiration if TEE.   Continue with anidulafungin -  If no further positive culture, recommend to treat for 14d. If further positive culture arise from 11/28 or 12/1, then would treat for presumed endocarditis -  Dr Linus Salmons available this weekend. Dr Johnnye Sima to see back on Monday  Right leg amputation = please remove all staples. Appears well healed.  Baxter Flattery [facility] Infectious Diseases Cell: [phone] Pager: [phone]  08/05/2017, 3:15 PM

## 2017-08-05 NOTE — Progress Notes (Signed)
Patient ID: Lieutenant DiegoCharlie J Stanish Jr., male   DOB: 1951/06/10, 66 y.o.   MRN: [id number]    Subjective: Pt continues to improve.   Objective: Vital signs in last 24 hours: Temp:  [98.5 F (36.9 C)-99.7 F (37.6 C)] 98.8 F (37.1 C) (11/30 1630) Pulse Rate:  [41-135] 87 (11/30 1900) Resp:  [23-33] 28 (11/30 1900) BP: (90-117)/(45-67) 112/49 (11/30 1900) SpO2:  [91 %-99 %] 98 % (11/30 1900) FiO2 (%):  [40 %] 40 % (11/30 1601) Weight:  [78.3 kg (172 lb 9.9 oz)] 78.3 kg (172 lb 9.9 oz) (11/30 0500)  Intake/Output from previous day: 11/29 0701 - 11/30 0700 In: 2215.1 [I.V.:235.1; NG/GT:1680; IV Piggyback:300] Out: 1550 [Urine:1550] Intake/Output this shift: No intake/output data recorded.  Physical Exam:  General: Alert and oriented, pt able to answer questions tonight   Lab Results: Recent Labs    08/03/17 0334 08/04/17 0444 08/05/17 0533  HGB 8.0* 7.5* 7.2*  HCT 27.0* 25.8* 24.6*   CBC Latest Ref Rng & Units 08/05/2017 08/04/2017 08/03/2017  WBC 4.0 - 10.5 K/uL 10.2 11.3(H) 15.2(H)  Hemoglobin 13.0 - 17.0 g/dL 7.2(L) 7.5(L) 8.0(L)  Hematocrit 39.0 - 52.0 % 24.6(L) 25.8(L) 27.0(L)  Platelets 150 - 400 K/uL 181 159 159     BMET Recent Labs    08/04/17 0444 08/05/17 0533  NA 145 147*  K 3.4* 3.3*  CL 118* 118*  CO2 21* 23  GLUCOSE 155* 107*  BUN 89* 63*  CREATININE 2.20* 1.66*  CALCIUM 8.6* 8.3*     Studies/Results:   Assessment/Plan: 1) Bilateral hydronephrosis: Renal function continues to improve and is now at his baseline. I further discussed option of nephrostomy tube placement vs continued observation with patient's daughter tonight. With blood culture now growing Candida glabrata, this raises concern about the GU tract and particularly the right kidney as a potential source of sepsis considering his CT findings (hyperdense areas of right renal collecting system) and urine culture demonstrating yeast.  After an detailed discussion with Mr. Oakland and his  daughter, they are agreeable to proceed with right nephrostomy tube placement.  This will help to definitively determine if he has fungal material in right renal collecting system.  I think it would be appropriate to hold off on left nephrostomy tube placement right now although would consider input from IR.  I would recommend consulting IR in the morning for evaluation and consideration of nephrostomy tube placement.  Cultures should be sent from nephrostomy.  The other question is whether he has urinary tract obstruction.  He clearly is producing adequate urine and his renal function has returned to his baseline.  His daughter does not wish to be overly aggressive and I agreed.  We can consider further evaluation after nephrostomy placement to determine if there is obstruction or a nephrostogram can be performed at time of nephrostomy placement if urine is not obviously infected.    Pt's daughter, Rosine BeatFelicia Dixon, is closest relative. Phone number is [phone].     LOS: 4 days   Dorwin Fitzhenry,LES 08/05/2017, 8:02 PM

## 2017-08-06 ENCOUNTER — Inpatient Hospital Stay (HOSPITAL_COMMUNITY): Payer: Medicare Other

## 2017-08-06 ENCOUNTER — Encounter (HOSPITAL_COMMUNITY): Payer: Self-pay | Admitting: *Deleted

## 2017-08-06 DIAGNOSIS — A419 Sepsis, unspecified organism: Secondary | ICD-10-CM

## 2017-08-06 DIAGNOSIS — B49 Unspecified mycosis: Secondary | ICD-10-CM

## 2017-08-06 DIAGNOSIS — I482 Chronic atrial fibrillation: Secondary | ICD-10-CM

## 2017-08-06 DIAGNOSIS — N170 Acute kidney failure with tubular necrosis: Secondary | ICD-10-CM

## 2017-08-06 DIAGNOSIS — D62 Acute posthemorrhagic anemia: Secondary | ICD-10-CM

## 2017-08-06 DIAGNOSIS — I5032 Chronic diastolic (congestive) heart failure: Secondary | ICD-10-CM

## 2017-08-06 DIAGNOSIS — J13 Pneumonia due to Streptococcus pneumoniae: Secondary | ICD-10-CM

## 2017-08-06 DIAGNOSIS — R509 Fever, unspecified: Secondary | ICD-10-CM

## 2017-08-06 DIAGNOSIS — R6520 Severe sepsis without septic shock: Secondary | ICD-10-CM

## 2017-08-06 DIAGNOSIS — N183 Chronic kidney disease, stage 3 (moderate): Secondary | ICD-10-CM

## 2017-08-06 DIAGNOSIS — B377 Candidal sepsis: Principal | ICD-10-CM

## 2017-08-06 DIAGNOSIS — I272 Pulmonary hypertension, unspecified: Secondary | ICD-10-CM

## 2017-08-06 DIAGNOSIS — B379 Candidiasis, unspecified: Secondary | ICD-10-CM

## 2017-08-06 DIAGNOSIS — I5022 Chronic systolic (congestive) heart failure: Secondary | ICD-10-CM

## 2017-08-06 LAB — GLUCOSE, CAPILLARY
GLUCOSE-CAPILLARY: 108 mg/dL — AB (ref 65–99)
Glucose-Capillary: 102 mg/dL — ABNORMAL HIGH (ref 65–99)
Glucose-Capillary: 102 mg/dL — ABNORMAL HIGH (ref 65–99)
Glucose-Capillary: 104 mg/dL — ABNORMAL HIGH (ref 65–99)
Glucose-Capillary: 107 mg/dL — ABNORMAL HIGH (ref 65–99)
Glucose-Capillary: 84 mg/dL (ref 65–99)
Glucose-Capillary: 97 mg/dL (ref 65–99)

## 2017-08-06 LAB — CBC
HCT: 25.4 % — ABNORMAL LOW (ref 39.0–52.0)
Hemoglobin: 7.4 g/dL — ABNORMAL LOW (ref 13.0–17.0)
MCH: 23.4 pg — ABNORMAL LOW (ref 26.0–34.0)
MCHC: 29.1 g/dL — ABNORMAL LOW (ref 30.0–36.0)
MCV: 80.4 fL (ref 78.0–100.0)
Platelets: 184 K/uL (ref 150–400)
RBC: 3.16 MIL/uL — ABNORMAL LOW (ref 4.22–5.81)
RDW: 17.2 % — ABNORMAL HIGH (ref 11.5–15.5)
WBC: 11.2 K/uL — ABNORMAL HIGH (ref 4.0–10.5)

## 2017-08-06 LAB — BASIC METABOLIC PANEL WITH GFR
Anion gap: 7 (ref 5–15)
BUN: 43 mg/dL — ABNORMAL HIGH (ref 6–20)
CO2: 25 mmol/L (ref 22–32)
Calcium: 8 mg/dL — ABNORMAL LOW (ref 8.9–10.3)
Chloride: 116 mmol/L — ABNORMAL HIGH (ref 101–111)
Creatinine, Ser: 1.28 mg/dL — ABNORMAL HIGH (ref 0.61–1.24)
GFR calc Af Amer: >60 mL/min
GFR calc non Af Amer: 58 mL/min — ABNORMAL LOW
Glucose, Bld: 109 mg/dL — ABNORMAL HIGH (ref 65–99)
Potassium: 3.3 mmol/L — ABNORMAL LOW (ref 3.5–5.1)
Sodium: 148 mmol/L — ABNORMAL HIGH (ref 135–145)

## 2017-08-06 LAB — CULTURE, BLOOD (ROUTINE X 2)
Culture: NO GROWTH
Special Requests: ADEQUATE

## 2017-08-06 LAB — CULTURE, RESPIRATORY W GRAM STAIN

## 2017-08-06 LAB — MAGNESIUM: Magnesium: 1.4 mg/dL — ABNORMAL LOW (ref 1.7–2.4)

## 2017-08-06 LAB — CULTURE, RESPIRATORY

## 2017-08-06 LAB — PREPARE RBC (CROSSMATCH)

## 2017-08-06 MED ORDER — SODIUM CHLORIDE 0.9 % IV SOLN
Freq: Once | INTRAVENOUS | Status: AC
  Administered 2017-08-06: 14:00:00 via INTRAVENOUS

## 2017-08-06 MED ORDER — DEXTROSE 5 % IV SOLN
2.5000 g | Freq: Three times a day (TID) | INTRAVENOUS | Status: DC
  Filled 2017-08-06: qty 12

## 2017-08-06 MED ORDER — DEXTROSE 5 % IV SOLN
2.0000 g | Freq: Three times a day (TID) | INTRAVENOUS | Status: AC
  Administered 2017-08-06 – 2017-08-11 (×16): 2 g via INTRAVENOUS
  Filled 2017-08-06 (×16): qty 2

## 2017-08-06 MED ORDER — MAGNESIUM SULFATE IN D5W 1-5 GM/100ML-% IV SOLN
1.0000 g | Freq: Once | INTRAVENOUS | Status: DC
  Filled 2017-08-06: qty 100

## 2017-08-06 MED ORDER — POTASSIUM CHLORIDE 20 MEQ/15ML (10%) PO SOLN: 40.0000 meq | Freq: Once | ORAL | Status: DC

## 2017-08-06 MED ORDER — VITAL AF 1.2 CAL PO LIQD
1000.0000 mL | ORAL | Status: DC
  Administered 2017-08-07 – 2017-08-19 (×16): 1000 mL
  Filled 2017-08-06 (×21): qty 1000

## 2017-08-06 NOTE — Progress Notes (Signed)
Pt tolerated ATC well.  Pt has been placed on the vent for the evening.

## 2017-08-06 NOTE — Progress Notes (Signed)
Subjective: Candidal sepsis with right hydro.  He has had further improvement in his Cr but still has a low grade fever.   He has not had the right NT ordered yet.     ROS:  Review of Systems  Unable to perform ROS: Intubated    Anti-infectives: Anti-infectives (From admission, onward)   Start     Dose/Rate Route Frequency Ordered Stop   08/06/17 0400  anidulafungin (ERAXIS) 100 mg in sodium chloride 0.9 % 100 mL IVPB     100 mg 78 mL/hr over 100 Minutes Intravenous Every 24 hours 08/05/17 0349     08/05/17 0400  anidulafungin (ERAXIS) 200 mg in sodium chloride 0.9 % 200 mL IVPB     200 mg 78 mL/hr over 200 Minutes Intravenous  Once 08/05/17 0348 08/05/17 0838   08/04/17 1400  ceftazidime-avibactam (AVYCAZ) 1.25 g in dextrose 5 % 50 mL IVPB     1.25 g 25 mL/hr over 2 Hours Intravenous Every 8 hours 08/04/17 0838     08/02/17 2100  vancomycin (VANCOCIN) IVPB 750 mg/150 ml premix  Status:  Discontinued     750 mg 150 mL/hr over 60 Minutes Intravenous Every 24 hours 08/02/17 1409 08/02/17 1630   08/02/17 2100  ceFEPIme (MAXIPIME) 1 g in dextrose 5 % 50 mL IVPB  Status:  Discontinued     1 g 100 mL/hr over 30 Minutes Intravenous Every 24 hours 08/02/17 1409 08/02/17 1501   08/02/17 1800  linezolid (ZYVOX) IVPB 600 mg  Status:  Discontinued     600 mg 300 mL/hr over 60 Minutes Intravenous Every 12 hours 08/02/17 1629 08/04/17 1452   08/02/17 1600  ceftazidime-avibactam (AVYCAZ) 0.94 g in dextrose 5 % 50 mL IVPB  Status:  Discontinued     0.94 g 25 mL/hr over 2 Hours Intravenous Every 12 hours 08/02/17 1501 08/04/17 0838   08/01/17 1830  ceFEPIme (MAXIPIME) 2 g in dextrose 5 % 50 mL IVPB     2 g 100 mL/hr over 30 Minutes Intravenous  Once 08/01/17 1819 08/01/17 2045   08/01/17 1745  vancomycin (VANCOCIN) 1,500 mg in sodium chloride 0.9 % 500 mL IVPB     1,500 mg 250 mL/hr over 120 Minutes Intravenous  Once 08/01/17 1739 08/01/17 2242   08/01/17 1730  piperacillin-tazobactam  (ZOSYN) IVPB 3.375 g  Status:  Discontinued     3.375 g 100 mL/hr over 30 Minutes Intravenous  Once 08/01/17 1723 08/01/17 1818   08/01/17 1730  vancomycin (VANCOCIN) IVPB 1000 mg/200 mL premix  Status:  Discontinued     1,000 mg 200 mL/hr over 60 Minutes Intravenous  Once 08/01/17 1723 08/01/17 1739      Current Facility-Administered Medications  Medication Dose Route Frequency Provider Last Rate Last Dose  . 0.9 %  sodium chloride infusion   Intravenous Once Allie Bossier, MD      . acetaminophen (TYLENOL) tablet 650 mg  650 mg Oral Q4H PRN Mannam, Praveen, MD   650 mg at 08/04/17 0032  . anidulafungin (ERAXIS) 100 mg in sodium chloride 0.9 % 100 mL IVPB  100 mg Intravenous Q24H Chesley Mires, MD   Stopped at 08/06/17 [phone]  . ceftazidime-avibactam (AVYCAZ) 1.25 g in dextrose 5 % 50 mL IVPB  1.25 g Intravenous Q8H Mannam, Hart Robinsons, MD   Stopped at 08/06/17 0813  . chlorhexidine gluconate (MEDLINE KIT) (PERIDEX) 0.12 % solution 15 mL  15 mL Mouth Rinse BID Jennelle Human B, NP   15 mL at 08/06/17  [phone]  . diltiazem (CARDIZEM) 10 mg/ml oral suspension 30 mg  30 mg Per Tube Q6H Erick Colace, NP   30 mg at 08/06/17 0548  . feeding supplement (VITAL AF 1.2 CAL) liquid 1,000 mL  1,000 mL Per Tube Continuous Erick Colace, NP 70 mL/hr at 08/06/17 1015 1,000 mL at 08/06/17 1015  . fentaNYL (SUBLIMAZE) injection 100 mcg  100 mcg Intravenous Q15 min PRN Mannam, Praveen, MD      . fentaNYL (SUBLIMAZE) injection 100 mcg  100 mcg Intravenous Q2H PRN Mannam, Praveen, MD      . free water 250 mL  250 mL Per Tube Q4H Erick Colace, NP   250 mL at 08/06/17 1015  . heparin injection 5,000 Units  5,000 Units Subcutaneous Q8H Jennelle Human B, NP   5,000 Units at 08/06/17 0548  . ipratropium-albuterol (DUONEB) 0.5-2.5 (3) MG/3ML nebulizer solution 3 mL  3 mL Nebulization Q6H Jennelle Human B, NP   3 mL at 08/06/17 8003  . levothyroxine (SYNTHROID, LEVOTHROID) tablet 125 mcg  125 mcg Per Tube QAC  breakfast Aldean Jewett, MD   125 mcg at 08/06/17 0814  . lidocaine (XYLOCAINE) 2 % viscous mouth solution    PRN Sandi Mariscal, MD   5 mL at 08/03/17 1629  . MEDLINE mouth rinse  15 mL Mouth Rinse QID Jennelle Human B, NP   15 mL at 08/06/17 0314  . pantoprazole sodium (PROTONIX) 40 mg/20 mL oral suspension 40 mg  40 mg Per Tube Daily Harvel Quale, RPH   40 mg at 08/05/17 2100  . senna-docusate (Senokot-S) tablet 1 tablet  1 tablet Per Tube QHS Mannam, Praveen, MD      . sodium chloride 0.9 % bolus 1,000 mL  1,000 mL Intravenous Once Jennelle Human B, NP         Objective: Vital signs in last 24 hours: Temp:  [98 F (36.7 C)-100.3 F (37.9 C)] 100.1 F (37.8 C) (12/01 0833) Pulse Rate:  [50-135] 104 (12/01 1100) Resp:  [24-38] 27 (12/01 1100) BP: (91-128)/(44-112) 120/73 (12/01 1100) SpO2:  [91 %-100 %] 100 % (12/01 1100) FiO2 (%):  [40 %] 40 % (12/01 0831) Weight:  [79.8 kg (175 lb 14.8 oz)] 79.8 kg (175 lb 14.8 oz) (12/01 0500)  Intake/Output from previous day: 11/30 0701 - 12/01 0700 In: 1950 [I.V.:60; NG/GT:1610; IV Piggyback:280] Out: 2500 [Urine:2500] Intake/Output this shift: Total I/O In: 550 [NG/GT:550] Out: 375 [Urine:375]   Physical Exam  Constitutional: He is well-developed, well-nourished, and in no distress.  Vitals reviewed.   Lab Results:  Recent Labs    08/05/17 0533 08/06/17 0533  WBC 10.2 11.2*  HGB 7.2* 7.4*  HCT 24.6* 25.4*  PLT 181 184   BMET Recent Labs    08/05/17 0533 08/06/17 0533  NA 147* 148*  K 3.3* 3.3*  CL 118* 116*  CO2 23 25  GLUCOSE 107* 109*  BUN 63* 43*  CREATININE 1.66* 1.28*  CALCIUM 8.3* 8.0*   PT/INR No results for input(s): LABPROT, INR in the last 72 hours. ABG No results for input(s): PHART, HCO3 in the last 72 hours.  Invalid input(s): PCO2, PO2  Studies/Results: Ct Abdomen Pelvis Wo Contrast  Result Date: 08/04/2017 CLINICAL DATA:  Multisystem organ failure. Recent gastrostomy tube placement.  Hydronephrosis seen on recent CT scan from 2 days ago. EXAM: CT ABDOMEN AND PELVIS WITHOUT CONTRAST TECHNIQUE: Multidetector CT imaging of the abdomen and pelvis was performed following the standard protocol without  IV contrast. COMPARISON:  CT scan 08/02/2017 FINDINGS: Lower chest: There are small bilateral pleural effusions and bibasilar atelectasis. The heart is enlarged. No definite pericardial effusion. The distal esophagus is grossly normal. Hepatobiliary: No focal hepatic lesions. Numerous small layering gallstones are noted in the gallbladder but no definite findings for acute cholecystitis. No common bile duct dilatation. Pancreas: No definite mass, inflammation or ductal dilatation. Spleen: Normal size.  No focal lesions. Adrenals/Urinary Tract: The adrenal glands are unremarkable. Right-sided hydroureteronephrosis all the way down to the bladder without definite obstructing calculus or mass. The bladder is decompressed by Foley catheter. There is an extra renal pelvis noted on left side but no definite hydronephrosis and no left-sided hydroureter. Stomach/Bowel: The feeding gastrostomy tube is in good position. No complicating features are identified. The small bowel and colon are grossly normal. No mass or obstruction. Vascular/Lymphatic: Advanced atherosclerotic calcifications involving the aorta and iliac arteries but no aneurysm. Small scattered mesenteric and retroperitoneal lymph nodes but no mass or overt adenopathy. Reproductive: The prostate gland and seminal vesicles are unremarkable. Other: Small scattered pelvic lymph nodes without overt adenopathy. No inguinal mass or adenopathy. Musculoskeletal: No significant bony findings. IMPRESSION: 1. The feeding gastrostomy tube appears be in good position without complicating features. 2. Small bilateral pleural effusions and bibasilar atelectasis. 3. Cholelithiasis without definite CT findings for acute cholecystitis. 4. Right-sided  hydroureteronephrosis without obvious cause. No distal obstructing ureteral calculus or obvious bladder lesion. The bladder is decompressed by a Foley catheter. 5. Small amount of free abdominal and free pelvic fluid. Electronically Signed   By: Marijo Sanes M.D.   On: 08/04/2017 16:14   Dg Chest Port 1 View  Result Date: 08/06/2017 CLINICAL DATA:  Pneumonia EXAM: PORTABLE CHEST 1 VIEW COMPARISON:  08/05/2017 FINDINGS: Tracheostomy is unchanged. Right PICC line has been removed. Cardiomegaly with vascular congestion. Airspace disease throughout the left lung, most pronounced in the left lung base. Aeration of the left lung slightly worsened since prior study. No confluent opacity on the right. Possible small left effusion. IMPRESSION: Left lung airspace disease, most confluent in the left lower lobe, slightly increased since prior study. Suspect small left effusion. Electronically Signed   By: Rolm Baptise M.D.   On: 08/06/2017 07:12   Dg Chest Port 1 View  Result Date: 08/05/2017 CLINICAL DATA:  Acute respiratory failure EXAM: PORTABLE CHEST 1 VIEW COMPARISON:  08/03/2017 FINDINGS: Tracheostomy and right PICC line remain in place, unchanged. Cardiomegaly. Vascular congestion. Continued bilateral perihilar and lower lobe opacities, left greater than right, slightly improved on the right since prior study. This likely reflects improving edema. IMPRESSION: Cardiomegaly. Suspect mild improvement in pulmonary edema pattern with mild residual edema/CHF. Electronically Signed   By: Rolm Baptise M.D.   On: 08/05/2017 09:22     Assessment and Plan: Candidal sepsis with right hydro and AKI improving on current therapy and foley drainage.  I will place the order for a Right NT placement today if possible.  I have communicated with Dr. Sherral Hammers and he will hold the tube feeds and heparin.        LOS: 5 days    Craig Oneal 08/06/2017 [phone] ID: Alvy Bimler Durel Salts., male   DOB: Jul 16, 1951, 66 y.o.    MRN: [id number]

## 2017-08-06 NOTE — Progress Notes (Signed)
PROGRESS NOTE    Craig Oneal.  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 Blood cultures sent and started on cefepime 11/26 and amikacin 11/27.  Additionally, daughter reports patient normally requires ventilator at night normally but has required continously for the last two days.     Patient has been at [facility] since February 2018; prior to was at [facility].  Hospitalized at [facility] in 01/2017 with proteus bacteremia, septic and hemorrhagic shock.  Additionally has history of serratia and pseudomonas from tracheal aspirate in July, both sensitive to Cefepime, as well as enterobacter and morganella.  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      Subjective: 12/1 eyes open alert attempts to mouth answers to questions. Quadriplegia.   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   Chronic systolic CHF -Strict in and out -Daily weight -Cardizem 30 mg QID -Transfuse for hemoglobin<8  Pulmonary HTN  Chronic Atrial Fibrillation -Prior to admission was on warfarin held on admission.    Acute on CKD stage III  -Patient with chronic Foley -RIGHT renal collecting system hemorrhage -Secondary to bilateral hydronephrosis, bacteremia. Per urology note appears   -Per urology believed source of bacteremia (Candida Glabrata from GU tract specifically RIGHT kidney).  -Urology recommends RIGHT nephrostomy tube placement. I discussed case with Dr. Irine Seal Urology will order placement of tube for today or tomorrow. Currently her urologist does not feel left nephrostomy tube required. -DC heparin -DC tube feeds  Hypernatremia    Chronic foley  Dysphagia -NPO -Gtube-->malpositioned > replaced by IR -Transaminitis - appears chronic based on May 2018 labs -Cont tubefeeds: Tube feeds will be discontinued today placement of nephrostomy tube   Acute blood loss anemia presumably from area noted on CT scan from right renal collecting system noting concern for hemorrhage -11/27 transfused 2 units PRBC -12/1 transfuse 1 unit PRBC  -Transfuse for hemoglobin<8 Recent Labs  Lab 08/02/17 1130 08/02/17 1745 08/03/17 0334 08/04/17 0444 08/05/17 0533 08/06/17 0533  HGB 5.9* 8.3* 8.0* 7.5* 7.2* 7.4*      Hypothyroidism  -Synthroid     Acute encephalopathy - ddx infection/sepsis vs uremic  -Hx cerebral atrophy, paraplegia w/ataxia -Continue supportive care -Unsure of baseline P:       DVT prophylaxis: SCD Code Status: Full Family Communication: None Disposition Plan: TBD  Consultants:  [facility] M Urology ID     Procedures/Significant Events:   STUDIES:  11/26 CXR >> bilateral  infiltrates vs edema 11/27 CXR >> stable bilateral opacities 11/27 Echocardiogram :  LVEF = 40-45% with global hypokinesis and inferior basal akinesis.   -RIGHT [facility]: moderate tricuspid valve regurg.  -Tricuspid valve: Moderate regurgitation -Pulmonary arteries PA peak pressure 51 mmHg   11/27 CT chest and abdomen : Percutaneous gastrostomy tube retracted into the abdominal wall.  Radiologist reports what appears to be mature tract to the gastric lumen given oral contrast injected through catheter did not extravasate.  There is no pneumoperitoneum or fluid collection.  Advanced bilateral hydronephrosis with upper hydroureter.  High density in the right renal collecting system consistent with hemorrhage.  Left greater than right lower lobe airspace disease.       I have personally reviewed and interpreted all radiology studies and my findings are as above.  VENTILATOR SETTINGS: Trach collar FiO2: 40%   Cultures Prior cultures at [facility] 11/23 ? 11/26 blood 2/2 positive coag negative staph  11/26 blood positive Candida Glabrata 11/26 Trach Aspirate: Positive Pseudomonas Aeruginosa  11/26 UC positive 30,000 colonies yeast 11/27 MRSA by PCR negative  11/28 blood positive yeast 12/1 blood pending   Antimicrobials: Anti-infectives (From admission, onward)   Start     Stop   08/06/17 2200  ceftazidime-avibactam (AVYCAZ) 2.5 g in dextrose 5 % 50 mL IVPB  Status:  Discontinued     08/06/17 1547   08/06/17 1700  cefTAZidime (FORTAZ) 2 g in dextrose 5 % 50 mL IVPB     08/14/17 2359   08/06/17 0400  anidulafungin (ERAXIS) 100 mg in sodium chloride 0.9 % 100 mL IVPB         08/05/17 0400  anidulafungin (ERAXIS) 200 mg in sodium chloride 0.9 % 200 mL IVPB     08/05/17 0838   08/04/17 1400  ceftazidime-avibactam (AVYCAZ) 1.25 g in dextrose 5 % 50 mL IVPB  Status:  Discontinued     08/06/17 1431   08/02/17 2100  vancomycin (VANCOCIN) IVPB 750 mg/150 ml premix  Status:  Discontinued      08/02/17 1630   08/02/17 2100  ceFEPIme (MAXIPIME) 1 g in dextrose 5 % 50 mL IVPB  Status:  Discontinued     08/02/17 1501   08/02/17 1800  linezolid (ZYVOX) IVPB 600 mg  Status:  Discontinued     08/04/17 1452   08/02/17 1600  ceftazidime-avibactam (AVYCAZ) 0.94 g in dextrose 5 % 50 mL IVPB  Status:  Discontinued     08/04/17 0838   08/01/17 1830  ceFEPIme (MAXIPIME) 2 g in dextrose 5 % 50 mL IVPB     08/01/17 2045   08/01/17 1745  vancomycin (VANCOCIN) 1,500 mg in sodium chloride 0.9 % 500 mL IVPB     08/01/17 2242   08/01/17 1730  piperacillin-tazobactam (ZOSYN) IVPB 3.375 g  Status:  Discontinued     08/01/17 1818   08/01/17 1730  vancomycin (VANCOCIN) IVPB 1000 mg/200 mL premix  Status:  Discontinued     08/01/17 1739       Devices   LINES / TUBES:  R PICC from [facility] -  Gastrostomy tube   Foley>> changed 11/26 >>  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 anidulafungin Stopped (08/06/17 0439)  . ceftazidime avibactam (AVYCAZ) IVPB 1.25 g (08/06/17 0548)  . feeding supplement (VITAL AF 1.2 CAL) 1,000 mL (08/06/17 0600)  . sodium chloride  Objective: Vitals:   08/06/17 0400 08/06/17 0500 08/06/17 0600 08/06/17 0700  BP: 106/64 111/60 108/65 (!) 109/59  Pulse: 67 86 95 66  Resp: (!) 25 (!) 24 (!) 24 (!) 26  Temp:      TempSrc:      SpO2: 100% 97% 99% 99%  Weight:  175 lb 14.8 oz (79.8 kg)    Height:        Intake/Output Summary (Last 24 hours) at 08/06/2017 0754 Last data filed at 08/06/2017 0600 Gross per 24 hour  Intake 1950 ml  Output 2500 ml  Net -550 ml   Filed Weights   08/04/17 0450 08/05/17 0500 08/06/17 0500  Weight: 173 lb 4.5 oz (78.6 kg) 172 lb 9.9 oz (78.3 kg) 175 lb 14.8 oz (79.8 kg)    Examination:  General: Eyes open, alert, attempts to mouth answers to questions. Positive acute respiratory distress Neck:  Negative scars, masses, torticollis, lymphadenopathy, JVD, #7 cuffed trach in place, covered and clean negative  sign of infection. Currently negative discharge however reported copious secretions thick white overnight. Lungs: positive diffuse rhonchi, negative wheezes or crackles  Cardiovascular: Regular rate and rhythm without murmur gallop or rub normal S1 and S2 Abdomen: negative abdominal pain, nondistended, positive soft, bowel sounds, no rebound, no ascites, no appreciable mass Extremities: No significant cyanosis, clubbing, or edema of LLE. Right AKA staples still in place. Well-healed negative sign of infection, negative discharge.  Skin: Negative rashes, lesions, ulcers Psychiatric:  Unable to assess secondary to patient being trached.  Central nervous system:  Quadriplegia, sensation intact grimaces to painful stimuli. negative receptive aphasia.  .     Data Reviewed: Care during the described time interval was provided by me .  I have reviewed this patient's available data, including medical history, events of note, physical examination, and all test results as part of my evaluation.   CBC: Recent Labs  Lab 08/01/17 1730  08/02/17 1130 08/02/17 1745 08/03/17 0334 08/04/17 0444 08/05/17 0533 08/06/17 0533  WBC 17.0*  --  14.8*  --  15.2* 11.3* 10.2 11.2*  NEUTROABS 14.2*  --   --   --   --   --   --   --   HGB 8.3*   < > 5.9* 8.3* 8.0* 7.5* 7.2* 7.4*  HCT 26.9*   < > 20.8* 28.2* 27.0* 25.8* 24.6* 25.4*  MCV 77.7*  --  78.5  --  79.2 78.9 79.6 80.4  PLT 154  --  129*  --  159 159 181 184   < > = values in this interval not displayed.   Basic Metabolic Panel: Recent Labs  Lab 08/01/17 1730 08/01/17 1939  08/02/17 1130  08/03/17 1200 08/03/17 1450 08/03/17 1724 08/04/17 0444 08/04/17 1606 08/05/17 0533  NA 142 146*  --  147*  --  143  --   --  145  --  147*  K 6.5* 5.6*  --  4.3  --  3.6  --   --  3.4*  --  3.3*  CL 106 117*  --  122*  --  116*  --   --  118*  --  118*  CO2 23  --   --  19*  --  20*  --   --  21*  --  23  GLUCOSE 129* 123*  --  96  --  127*  --   --   155*  --  107*  BUN 213* >140*  --  156*  --  102*  --   --  89*  --  63*  CREATININE 4.74* 4.50*  --  3.39*  --  2.41*  --   --  2.20*  --  1.66*  CALCIUM 11.7*  --   --  8.9  --  8.8*  --   --  8.6*  --  8.3*  MG  --   --    < >  --    < >  --  1.9 1.9 2.0 1.7 1.7  PHOS  --   --    < >  --    < > 3.9 3.6 3.7 2.9 2.9 2.3*   < > = values in this interval not displayed.   GFR: Estimated Creatinine Clearance: 47.9 mL/min (A) (by C-G formula based on SCr of 1.66 mg/dL (H)). Liver Function Tests: Recent Labs  Lab 08/01/17 1730 08/02/17 1130 08/03/17 1200  AST 55* 39  --   ALT 71* 43  --   ALKPHOS 229* 153*  --   BILITOT 0.6 0.5  --   PROT 9.6* 7.2  --   ALBUMIN 2.3* 1.6* 1.6*   No results for input(s): LIPASE, AMYLASE in the last 168 hours. No results for input(s): AMMONIA in the last 168 hours. Coagulation Profile: Recent Labs  Lab 08/01/17 2146  INR 1.18   Cardiac Enzymes: No results for input(s): CKTOTAL, CKMB, CKMBINDEX, TROPONINI in the last 168 hours. BNP (last 3 results) No results for input(s): PROBNP in the last 8760 hours. HbA1C: No results for input(s): HGBA1C in the last 72 hours. CBG: Recent Labs  Lab 08/05/17 1206 08/05/17 1627 08/05/17 2031 08/06/17 0012 08/06/17 0350  GLUCAP 113* 111* 98 97 102*   Lipid Profile: No results for input(s): CHOL, HDL, LDLCALC, TRIG, CHOLHDL, LDLDIRECT in the last 72 hours. Thyroid Function Tests: No results for input(s): TSH, T4TOTAL, FREET4, T3FREE, THYROIDAB in the last 72 hours. Anemia Panel: No results for input(s): VITAMINB12, FOLATE, FERRITIN, TIBC, IRON, RETICCTPCT in the last 72 hours. Urine analysis:    Component Value Date/Time   COLORURINE YELLOW 08/01/2017 2051   APPEARANCEUR CLOUDY (A) 08/01/2017 2051   LABSPEC 1.020 08/01/2017 2051   PHURINE 5.5 08/01/2017 2051   GLUCOSEU NEGATIVE 08/01/2017 2051   HGBUR LARGE (A) 08/01/2017 2051   BILIRUBINUR NEGATIVE 08/01/2017 2051   KETONESUR NEGATIVE 08/01/2017  2051   PROTEINUR 100 (A) 08/01/2017 2051   NITRITE NEGATIVE 08/01/2017 2051   LEUKOCYTESUR MODERATE (A) 08/01/2017 2051   Sepsis Labs: @LABRCNTIP (procalcitonin:4,lacticidven:4)  ) Recent Results (from the past 240 hour(s))  Blood Culture (routine x 2)     Status: Abnormal   Collection Time: 08/01/17  5:22 PM  Result Value Ref Range Status   Specimen Description BLOOD LEFT HAND  Final   Special Requests   Final    Blood Culture adequate volume BOTTLES DRAWN AEROBIC AND ANAEROBIC   Culture  Setup Time   Final    GRAM POSITIVE COCCI IN CLUSTERS ANAEROBIC BOTTLE ONLY CRITICAL RESULT CALLED TO, READ BACK BY AND VERIFIED WITH: PHARMD G ABBOTT [id number] MLM YEAST AEROBIC BOTTLE ONLY IDENTIFICATION TO FOLLOW CRITICAL RESULT CALLED TO, READ BACK BY AND VERIFIED WITH: Rich Fuchs PHARMD 0330 08/05/17 A BROWNING    Culture (A)  Final    STAPHYLOCOCCUS SPECIES (COAGULASE NEGATIVE) THE SIGNIFICANCE OF ISOLATING THIS ORGANISM FROM A SINGLE SET OF BLOOD CULTURES WHEN MULTIPLE SETS ARE DRAWN IS UNCERTAIN. PLEASE NOTIFY THE MICROBIOLOGY DEPARTMENT WITHIN ONE WEEK IF  SPECIATION AND SENSITIVITIES ARE REQUIRED.    Report Status 08/04/2017 FINAL  Final  Blood Culture ID Panel (Reflexed)     Status: Abnormal   Collection Time: 08/01/17  5:22 PM  Result Value Ref Range Status   Enterococcus species NOT DETECTED NOT DETECTED Final   Listeria monocytogenes NOT DETECTED NOT DETECTED Final   Staphylococcus species DETECTED (A) NOT DETECTED Final    Comment: Methicillin (oxacillin) resistant coagulase negative staphylococcus. Possible blood culture contaminant (unless isolated from more than one blood culture draw or clinical case suggests pathogenicity). No antibiotic treatment is indicated for blood  culture contaminants. CRITICAL RESULT CALLED TO, READ BACK BY AND VERIFIED WITH: PHARMD G ABBOTT [id number] MLM    Staphylococcus aureus NOT DETECTED NOT DETECTED Final   Methicillin resistance DETECTED  (A) NOT DETECTED Final    Comment: CRITICAL RESULT CALLED TO, READ BACK BY AND VERIFIED WITH: PHARMD G ABBOTT [id number] MLM    Streptococcus species NOT DETECTED NOT DETECTED Final   Streptococcus agalactiae NOT DETECTED NOT DETECTED Final   Streptococcus pneumoniae NOT DETECTED NOT DETECTED Final   Streptococcus pyogenes NOT DETECTED NOT DETECTED Final   Acinetobacter baumannii NOT DETECTED NOT DETECTED Final   Enterobacteriaceae species NOT DETECTED NOT DETECTED Final   Enterobacter cloacae complex NOT DETECTED NOT DETECTED Final   Escherichia coli NOT DETECTED NOT DETECTED Final   Klebsiella oxytoca NOT DETECTED NOT DETECTED Final   Klebsiella pneumoniae NOT DETECTED NOT DETECTED Final   Proteus species NOT DETECTED NOT DETECTED Final   Serratia marcescens NOT DETECTED NOT DETECTED Final   Haemophilus influenzae NOT DETECTED NOT DETECTED Final   Neisseria meningitidis NOT DETECTED NOT DETECTED Final   Pseudomonas aeruginosa NOT DETECTED NOT DETECTED Final   Candida albicans NOT DETECTED NOT DETECTED Final   Candida glabrata NOT DETECTED NOT DETECTED Final   Candida krusei NOT DETECTED NOT DETECTED Final   Candida parapsilosis NOT DETECTED NOT DETECTED Final   Candida tropicalis NOT DETECTED NOT DETECTED Final  Blood Culture ID Panel (Reflexed)     Status: Abnormal   Collection Time: 08/01/17  5:22 PM  Result Value Ref Range Status   Enterococcus species NOT DETECTED NOT DETECTED Final   Listeria monocytogenes NOT DETECTED NOT DETECTED Final   Staphylococcus species NOT DETECTED NOT DETECTED Final   Staphylococcus aureus NOT DETECTED NOT DETECTED Final   Streptococcus species NOT DETECTED NOT DETECTED Final   Streptococcus agalactiae NOT DETECTED NOT DETECTED Final   Streptococcus pneumoniae NOT DETECTED NOT DETECTED Final   Streptococcus pyogenes NOT DETECTED NOT DETECTED Final   Acinetobacter baumannii NOT DETECTED NOT DETECTED Final   Enterobacteriaceae species NOT DETECTED  NOT DETECTED Final   Enterobacter cloacae complex NOT DETECTED NOT DETECTED Final   Escherichia coli NOT DETECTED NOT DETECTED Final   Klebsiella oxytoca NOT DETECTED NOT DETECTED Final   Klebsiella pneumoniae NOT DETECTED NOT DETECTED Final   Proteus species NOT DETECTED NOT DETECTED Final   Serratia marcescens NOT DETECTED NOT DETECTED Final   Haemophilus influenzae NOT DETECTED NOT DETECTED Final   Neisseria meningitidis NOT DETECTED NOT DETECTED Final   Pseudomonas aeruginosa NOT DETECTED NOT DETECTED Final   Candida albicans NOT DETECTED NOT DETECTED Final   Candida glabrata DETECTED (A) NOT DETECTED Final    Comment: CRITICAL RESULT CALLED TO, READ BACK BY AND VERIFIED WITH: K WEIGLE PHARMD 0330 08/05/17 A BROWNING    Candida krusei NOT DETECTED NOT DETECTED Final   Candida parapsilosis NOT DETECTED NOT DETECTED Final  Candida tropicalis NOT DETECTED NOT DETECTED Final  Blood Culture (routine x 2)     Status: None (Preliminary result)   Collection Time: 08/01/17  7:05 PM  Result Value Ref Range Status   Specimen Description BLOOD RIGHT PICC LINE  Final   Special Requests IN PEDIATRIC BOTTLE Blood Culture adequate volume  Final   Culture NO GROWTH 4 DAYS  Final   Report Status PENDING  Incomplete  Culture, respiratory (tracheal aspirate)     Status: None (Preliminary result)   Collection Time: 08/01/17 11:56 PM  Result Value Ref Range Status   Specimen Description TRACHEAL ASPIRATE  Final   Special Requests NONE  Final   Gram Stain   Final    ABUNDANT WBC PRESENT,BOTH PMN AND MONONUCLEAR MODERATE GRAM NEGATIVE RODS RARE GRAM POSITIVE RODS    Culture   Final    FEW PSEUDOMONAS AERUGINOSA REPEATING SENSITIVITIES    Report Status PENDING  Incomplete  Urine culture     Status: Abnormal   Collection Time: 08/01/17 11:56 PM  Result Value Ref Range Status   Specimen Description URINE, CATHETERIZED  Final   Special Requests NONE  Final   Culture 30,000 COLONIES/mL YEAST (A)   Final   Report Status 08/03/2017 FINAL  Final  MRSA PCR Screening     Status: None   Collection Time: 08/02/17  1:05 AM  Result Value Ref Range Status   MRSA by PCR NEGATIVE NEGATIVE Final    Comment:        The GeneXpert MRSA Assay (FDA approved for NASAL specimens only), is one component of a comprehensive MRSA colonization surveillance program. It is not intended to diagnose MRSA infection nor to guide or monitor treatment for MRSA infections.   Culture, blood (Routine X 2) w Reflex to ID Panel     Status: None (Preliminary result)   Collection Time: 08/03/17  3:16 PM  Result Value Ref Range Status   Specimen Description BLOOD LEFT HAND  Final   Special Requests IN PEDIATRIC BOTTLE Blood Culture adequate volume  Final   Culture NO GROWTH 2 DAYS  Final   Report Status PENDING  Incomplete  Culture, blood (Routine X 2) w Reflex to ID Panel     Status: None (Preliminary result)   Collection Time: 08/03/17  3:28 PM  Result Value Ref Range Status   Specimen Description BLOOD LEFT HAND  Final   Special Requests IN PEDIATRIC BOTTLE Blood Culture adequate volume  Final   Culture  Setup Time   Final    YEAST IN PEDIATRIC BOTTLE CRITICAL VALUE NOTED.  VALUE IS CONSISTENT WITH PREVIOUSLY REPORTED AND CALLED VALUE.    Culture PENDING  Incomplete   Report Status PENDING  Incomplete         Radiology Studies: Ct Abdomen Pelvis Wo Contrast  Result Date: 08/04/2017 CLINICAL DATA:  Multisystem organ failure. Recent gastrostomy tube placement. Hydronephrosis seen on recent CT scan from 2 days ago. EXAM: CT ABDOMEN AND PELVIS WITHOUT CONTRAST TECHNIQUE: Multidetector CT imaging of the abdomen and pelvis was performed following the standard protocol without IV contrast. COMPARISON:  CT scan 08/02/2017 FINDINGS: Lower chest: There are small bilateral pleural effusions and bibasilar atelectasis. The heart is enlarged. No definite pericardial effusion. The distal esophagus is grossly  normal. Hepatobiliary: No focal hepatic lesions. Numerous small layering gallstones are noted in the gallbladder but no definite findings for acute cholecystitis. No common bile duct dilatation. Pancreas: No definite mass, inflammation or ductal dilatation. Spleen: Normal size.  No focal lesions. Adrenals/Urinary Tract: The adrenal glands are unremarkable. Right-sided hydroureteronephrosis all the way down to the bladder without definite obstructing calculus or mass. The bladder is decompressed by Foley catheter. There is an extra renal pelvis noted on left side but no definite hydronephrosis and no left-sided hydroureter. Stomach/Bowel: The feeding gastrostomy tube is in good position. No complicating features are identified. The small bowel and colon are grossly normal. No mass or obstruction. Vascular/Lymphatic: Advanced atherosclerotic calcifications involving the aorta and iliac arteries but no aneurysm. Small scattered mesenteric and retroperitoneal lymph nodes but no mass or overt adenopathy. Reproductive: The prostate gland and seminal vesicles are unremarkable. Other: Small scattered pelvic lymph nodes without overt adenopathy. No inguinal mass or adenopathy. Musculoskeletal: No significant bony findings. IMPRESSION: 1. The feeding gastrostomy tube appears be in good position without complicating features. 2. Small bilateral pleural effusions and bibasilar atelectasis. 3. Cholelithiasis without definite CT findings for acute cholecystitis. 4. Right-sided hydroureteronephrosis without obvious cause. No distal obstructing ureteral calculus or obvious bladder lesion. The bladder is decompressed by a Foley catheter. 5. Small amount of free abdominal and free pelvic fluid. Electronically Signed   By: Marijo Sanes M.D.   On: 08/04/2017 16:14   Dg Chest Port 1 View  Result Date: 08/06/2017 CLINICAL DATA:  Pneumonia EXAM: PORTABLE CHEST 1 VIEW COMPARISON:  08/05/2017 FINDINGS: Tracheostomy is unchanged. Right  PICC line has been removed. Cardiomegaly with vascular congestion. Airspace disease throughout the left lung, most pronounced in the left lung base. Aeration of the left lung slightly worsened since prior study. No confluent opacity on the right. Possible small left effusion. IMPRESSION: Left lung airspace disease, most confluent in the left lower lobe, slightly increased since prior study. Suspect small left effusion. Electronically Signed   By: Rolm Baptise M.D.   On: 08/06/2017 07:12   Dg Chest Port 1 View  Result Date: 08/05/2017 CLINICAL DATA:  Acute respiratory failure EXAM: PORTABLE CHEST 1 VIEW COMPARISON:  08/03/2017 FINDINGS: Tracheostomy and right PICC line remain in place, unchanged. Cardiomegaly. Vascular congestion. Continued bilateral perihilar and lower lobe opacities, left greater than right, slightly improved on the right since prior study. This likely reflects improving edema. IMPRESSION: Cardiomegaly. Suspect mild improvement in pulmonary edema pattern with mild residual edema/CHF. Electronically Signed   By: Rolm Baptise M.D.   On: 08/05/2017 09:22        Scheduled Meds: . chlorhexidine gluconate (MEDLINE KIT)  15 mL Mouth Rinse BID  . diltiazem  30 mg Per Tube Q6H  . free water  250 mL Per Tube Q4H  . heparin subcutaneous  5,000 Units Subcutaneous Q8H  . ipratropium-albuterol  3 mL Nebulization Q6H  . levothyroxine  125 mcg Per Tube QAC breakfast  . mouth rinse  15 mL Mouth Rinse QID  . pantoprazole sodium  40 mg Per Tube Daily  . senna-docusate  1 tablet Per Tube QHS   Continuous Infusions: . anidulafungin Stopped (08/06/17 0439)  . ceftazidime avibactam (AVYCAZ) IVPB 1.25 g (08/06/17 0548)  . feeding supplement (VITAL AF 1.2 CAL) 1,000 mL (08/06/17 0600)  . sodium chloride       LOS: 5 days    Time spent: 40 minutes    Marlise Fahr, Geraldo Docker, MD [facility] Pager [phone]   If 7PM-7AM, please contact night-coverage [email or web address] Password  [facility] 08/06/2017, 7:54 AM

## 2017-08-06 NOTE — Progress Notes (Signed)
Dr. Fredia SorrowYamagata has reviewed the patient's imaging as well as his labs.  Given his CT scan was two days ago and his creatinine has significantly improved, he is not convinced a PCN is truly needed.  A renal US has been ordered to re-evaluate his hydronephrosis, given his improved kidney function.  Pending these results, will determine whether he feels proceeding is necessary.  This will be discussed with the daughter as well.  He will be NPO p MN and ready for a procedure tomorrow if needed.  Letha CapeKelly E Denean Pavon 12:34 PM 08/06/2017

## 2017-08-06 NOTE — Progress Notes (Signed)
[facility]   Reason for visit: Follow up on pneumonia  Interval History: Pseudomonas in lungs sensitive to ceftaz, piptazo, cipro; son at bedside; fever curve decreasing, WBC stable and decreased; mouths no complaints  Physical Exam: Constitutional:  Vitals:   08/06/17 1500 08/06/17 1514  BP: 131/78 112/74  Pulse: 61 94  Resp: (!) 32 (!) 34  Temp:  98.3 F (36.8 C)  SpO2: 100% 100%   patient appears in NAD Eyes: anicteric HENT: + trach with trach collar in place Respiratory: Normal respiratory effort; CTA B, anterior exam Cardiovascular: Tachy RR GI: soft, nt, nd  Review of Systems: Constitutional: negative for fevers and chills Respiratory: negative for cough  Lab Results  Component Value Date   WBC 11.2 (H) 08/06/2017   HGB 7.4 (L) 08/06/2017   HCT 25.4 (L) 08/06/2017   MCV 80.4 08/06/2017   PLT 184 08/06/2017    Lab Results  Component Value Date   CREATININE 1.28 (H) 08/06/2017   BUN 43 (H) 08/06/2017   NA 148 (H) 08/06/2017   K 3.3 (L) 08/06/2017   CL 116 (H) 08/06/2017   CO2 25 08/06/2017    Lab Results  Component Value Date   ALT 43 08/02/2017   AST 39 08/02/2017   ALKPHOS 153 (H) 08/02/2017     Microbiology: Recent Results (from the past 240 hour(s))  Blood Culture (routine x 2)     Status: Abnormal (Preliminary result)   Collection Time: 08/01/17  5:22 PM  Result Value Ref Range Status   Specimen Description BLOOD LEFT HAND  Final   Special Requests   Final    Blood Culture adequate volume BOTTLES DRAWN AEROBIC AND ANAEROBIC   Culture  Setup Time   Final    GRAM POSITIVE COCCI IN CLUSTERS ANAEROBIC BOTTLE ONLY CRITICAL RESULT CALLED TO, READ BACK BY AND VERIFIED WITH: PHARMD G ABBOTT [phone] MLM YEAST AEROBIC BOTTLE ONLY CRITICAL RESULT CALLED TO, READ BACK BY AND VERIFIED WITH: K WEIGLE PHARMD 0330 08/05/17 A BROWNING    Culture (A)  Final    STAPHYLOCOCCUS SPECIES (COAGULASE NEGATIVE) THE SIGNIFICANCE  OF ISOLATING THIS ORGANISM FROM A SINGLE SET OF BLOOD CULTURES WHEN MULTIPLE SETS ARE DRAWN IS UNCERTAIN. PLEASE NOTIFY THE MICROBIOLOGY DEPARTMENT WITHIN ONE WEEK IF SPECIATION AND SENSITIVITIES ARE REQUIRED. CANDIDA GLABRATA CORRECTED ON 12/01 AT 0840: PREVIOUSLY REPORTED AS YEAST    Report Status PENDING  Incomplete  Blood Culture ID Panel (Reflexed)     Status: Abnormal   Collection Time: 08/01/17  5:22 PM  Result Value Ref Range Status   Enterococcus species NOT DETECTED NOT DETECTED Final   Listeria monocytogenes NOT DETECTED NOT DETECTED Final   Staphylococcus species DETECTED (A) NOT DETECTED Final    Comment: Methicillin (oxacillin) resistant coagulase negative staphylococcus. Possible blood culture contaminant (unless isolated from more than one blood culture draw or clinical case suggests pathogenicity). No antibiotic treatment is indicated for blood  culture contaminants. CRITICAL RESULT CALLED TO, READ BACK BY AND VERIFIED WITH: PHARMD G ABBOTT [phone] MLM    Staphylococcus aureus NOT DETECTED NOT DETECTED Final   Methicillin resistance DETECTED (A) NOT DETECTED Final    Comment: CRITICAL RESULT CALLED TO, READ BACK BY AND VERIFIED WITH: PHARMD G ABBOTT [phone] MLM    Streptococcus species NOT DETECTED NOT DETECTED Final   Streptococcus agalactiae NOT DETECTED NOT DETECTED Final   Streptococcus pneumoniae NOT DETECTED NOT DETECTED Final   Streptococcus pyogenes NOT DETECTED NOT  DETECTED Final   Acinetobacter baumannii NOT DETECTED NOT DETECTED Final   Enterobacteriaceae species NOT DETECTED NOT DETECTED Final   Enterobacter cloacae complex NOT DETECTED NOT DETECTED Final   Escherichia coli NOT DETECTED NOT DETECTED Final   Klebsiella oxytoca NOT DETECTED NOT DETECTED Final   Klebsiella pneumoniae NOT DETECTED NOT DETECTED Final   Proteus species NOT DETECTED NOT DETECTED Final   Serratia marcescens NOT DETECTED NOT DETECTED Final   Haemophilus influenzae NOT  DETECTED NOT DETECTED Final   Neisseria meningitidis NOT DETECTED NOT DETECTED Final   Pseudomonas aeruginosa NOT DETECTED NOT DETECTED Final   Candida albicans NOT DETECTED NOT DETECTED Final   Candida glabrata NOT DETECTED NOT DETECTED Final   Candida krusei NOT DETECTED NOT DETECTED Final   Candida parapsilosis NOT DETECTED NOT DETECTED Final   Candida tropicalis NOT DETECTED NOT DETECTED Final  Blood Culture ID Panel (Reflexed)     Status: Abnormal   Collection Time: 08/01/17  5:22 PM  Result Value Ref Range Status   Enterococcus species NOT DETECTED NOT DETECTED Final   Listeria monocytogenes NOT DETECTED NOT DETECTED Final   Staphylococcus species NOT DETECTED NOT DETECTED Final   Staphylococcus aureus NOT DETECTED NOT DETECTED Final   Streptococcus species NOT DETECTED NOT DETECTED Final   Streptococcus agalactiae NOT DETECTED NOT DETECTED Final   Streptococcus pneumoniae NOT DETECTED NOT DETECTED Final   Streptococcus pyogenes NOT DETECTED NOT DETECTED Final   Acinetobacter baumannii NOT DETECTED NOT DETECTED Final   Enterobacteriaceae species NOT DETECTED NOT DETECTED Final   Enterobacter cloacae complex NOT DETECTED NOT DETECTED Final   Escherichia coli NOT DETECTED NOT DETECTED Final   Klebsiella oxytoca NOT DETECTED NOT DETECTED Final   Klebsiella pneumoniae NOT DETECTED NOT DETECTED Final   Proteus species NOT DETECTED NOT DETECTED Final   Serratia marcescens NOT DETECTED NOT DETECTED Final   Haemophilus influenzae NOT DETECTED NOT DETECTED Final   Neisseria meningitidis NOT DETECTED NOT DETECTED Final   Pseudomonas aeruginosa NOT DETECTED NOT DETECTED Final   Candida albicans NOT DETECTED NOT DETECTED Final   Candida glabrata DETECTED (A) NOT DETECTED Final    Comment: CRITICAL RESULT CALLED TO, READ BACK BY AND VERIFIED WITH: K WEIGLE PHARMD 0330 08/05/17 A BROWNING    Candida krusei NOT DETECTED NOT DETECTED Final   Candida parapsilosis NOT DETECTED NOT DETECTED  Final   Candida tropicalis NOT DETECTED NOT DETECTED Final  Blood Culture (routine x 2)     Status: None   Collection Time: 08/01/17  7:05 PM  Result Value Ref Range Status   Specimen Description BLOOD RIGHT PICC LINE  Final   Special Requests IN PEDIATRIC BOTTLE Blood Culture adequate volume  Final   Culture NO GROWTH 5 DAYS  Final   Report Status 08/06/2017 FINAL  Final  Culture, respiratory (tracheal aspirate)     Status: None   Collection Time: 08/01/17 11:56 PM  Result Value Ref Range Status   Specimen Description TRACHEAL ASPIRATE  Final   Special Requests NONE  Final   Gram Stain   Final    ABUNDANT WBC PRESENT,BOTH PMN AND MONONUCLEAR MODERATE GRAM NEGATIVE RODS RARE GRAM POSITIVE RODS    Culture FEW PSEUDOMONAS AERUGINOSA  Final   Report Status 08/06/2017 FINAL  Final   Organism ID, Bacteria PSEUDOMONAS AERUGINOSA  Final      Susceptibility   Pseudomonas aeruginosa - MIC*    CEFTAZIDIME 4 SENSITIVE Sensitive     CIPROFLOXACIN <=0.25 SENSITIVE Sensitive  GENTAMICIN 8 INTERMEDIATE Intermediate     IMIPENEM >=16 RESISTANT Resistant     PIP/TAZO 16 SENSITIVE Sensitive     CEFEPIME 16 INTERMEDIATE Intermediate     * FEW PSEUDOMONAS AERUGINOSA  Urine culture     Status: Abnormal   Collection Time: 08/01/17 11:56 PM  Result Value Ref Range Status   Specimen Description URINE, CATHETERIZED  Final   Special Requests NONE  Final   Culture 30,000 COLONIES/mL YEAST (A)  Final   Report Status 08/03/2017 FINAL  Final  MRSA PCR Screening     Status: None   Collection Time: 08/02/17  1:05 AM  Result Value Ref Range Status   MRSA by PCR NEGATIVE NEGATIVE Final    Comment:        The GeneXpert MRSA Assay (FDA approved for NASAL specimens only), is one component of a comprehensive MRSA colonization surveillance program. It is not intended to diagnose MRSA infection nor to guide or monitor treatment for MRSA infections.   Culture, blood (Routine X 2) w Reflex to ID Panel      Status: None (Preliminary result)   Collection Time: 08/03/17  3:16 PM  Result Value Ref Range Status   Specimen Description BLOOD LEFT HAND  Final   Special Requests IN PEDIATRIC BOTTLE Blood Culture adequate volume  Final   Culture NO GROWTH 3 DAYS  Final   Report Status PENDING  Incomplete  Culture, blood (Routine X 2) w Reflex to ID Panel     Status: Abnormal (Preliminary result)   Collection Time: 08/03/17  3:28 PM  Result Value Ref Range Status   Specimen Description BLOOD LEFT HAND  Final   Special Requests IN PEDIATRIC BOTTLE Blood Culture adequate volume  Final   Culture  Setup Time   Final    YEAST IN PEDIATRIC BOTTLE CRITICAL VALUE NOTED.  VALUE IS CONSISTENT WITH PREVIOUSLY REPORTED AND CALLED VALUE.    Culture YEAST (A)  Final   Report Status PENDING  Incomplete    Impression/Plan:  1. Pseudomonal pneumonia - will narrow antibiotics to ceftaz.  14 days total through December 9th.    2.  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  Will need ophthalmology exam.  Continue to monitor cultures.    3.  Fever - trending down.

## 2017-08-06 NOTE — Progress Notes (Signed)
US of the kidneys shows resolution of hydronephrosis of both kidneys.  Imaging reviewed with Dr. Fredia SorrowYamagata.  No need for PCN.  Will call daughter and let her know.  Letha CapeKelly E Idella Lamontagne 3:40 PM 08/06/2017

## 2017-08-07 ENCOUNTER — Inpatient Hospital Stay (HOSPITAL_COMMUNITY): Payer: Medicare Other

## 2017-08-07 DIAGNOSIS — N1330 Unspecified hydronephrosis: Secondary | ICD-10-CM

## 2017-08-07 DIAGNOSIS — K810 Acute cholecystitis: Secondary | ICD-10-CM

## 2017-08-07 DIAGNOSIS — E039 Hypothyroidism, unspecified: Secondary | ICD-10-CM

## 2017-08-07 LAB — CULTURE, BLOOD (ROUTINE X 2)
SPECIAL REQUESTS: ADEQUATE
Special Requests: ADEQUATE

## 2017-08-07 LAB — GLUCOSE, CAPILLARY
GLUCOSE-CAPILLARY: 99 mg/dL (ref 65–99)
GLUCOSE-CAPILLARY: 109 mg/dL — AB (ref 65–99)
GLUCOSE-CAPILLARY: 97 mg/dL (ref 65–99)
GLUCOSE-CAPILLARY: 81 mg/dL (ref 65–99)
Glucose-Capillary: 83 mg/dL (ref 65–99)
Glucose-Capillary: 93 mg/dL (ref 65–99)

## 2017-08-07 LAB — BPAM RBC
BLOOD PRODUCT EXPIRATION DATE: 201812122359
ISSUE DATE / TIME: 201812011409
UNIT TYPE AND RH: 5100

## 2017-08-07 LAB — BASIC METABOLIC PANEL
ANION GAP: 4 — AB (ref 5–15)
ANION GAP: 6 (ref 5–15)
BUN: 21 mg/dL — ABNORMAL HIGH (ref 6–20)
BUN: 31 mg/dL — ABNORMAL HIGH (ref 6–20)
CHLORIDE: 115 mmol/L — AB (ref 101–111)
CO2: 17 mmol/L — ABNORMAL LOW (ref 22–32)
CO2: 27 mmol/L (ref 22–32)
Calcium: 4.7 mg/dL — CL (ref 8.9–10.3)
Calcium: 7.8 mg/dL — ABNORMAL LOW (ref 8.9–10.3)
Chloride: 128 mmol/L — ABNORMAL HIGH (ref 101–111)
Creatinine, Ser: 0.64 mg/dL (ref 0.61–1.24)
Creatinine, Ser: 0.97 mg/dL (ref 0.61–1.24)
GFR calc Af Amer: >60 mL/min (ref >60)
GFR calc non Af Amer: >60 mL/min (ref >60)
GLUCOSE: 71 mg/dL (ref 65–99)
Glucose, Bld: 92 mg/dL (ref 65–99)
POTASSIUM: 2.3 mmol/L — AB (ref 3.5–5.1)
POTASSIUM: 4.1 mmol/L (ref 3.5–5.1)
SODIUM: 149 mmol/L — AB (ref 135–145)
SODIUM: 148 mmol/L — AB (ref 135–145)

## 2017-08-07 LAB — PROTIME-INR
INR: 1.43
Prothrombin Time: 17.3 seconds — ABNORMAL HIGH (ref 11.4–15.2)

## 2017-08-07 LAB — CBC WITH DIFFERENTIAL/PLATELET
BASOS ABS: 0.1 10*3/uL (ref 0.0–0.1)
Basophils Relative: 1 %
EOS ABS: 0.3 10*3/uL (ref 0.0–0.7)
Eosinophils Relative: 2 %
HEMATOCRIT: 30.2 % — AB (ref 39.0–52.0)
HEMOGLOBIN: 9 g/dL — AB (ref 13.0–17.0)
LYMPHS PCT: 21 %
Lymphs Abs: 2.8 10*3/uL (ref 0.7–4.0)
MCH: 24.1 pg — ABNORMAL LOW (ref 26.0–34.0)
MCHC: 29.8 g/dL — AB (ref 30.0–36.0)
MCV: 81 fL (ref 78.0–100.0)
MONOS PCT: 4 %
Monocytes Absolute: 0.5 10*3/uL (ref 0.1–1.0)
NEUTROS ABS: 9.5 10*3/uL — AB (ref 1.7–7.7)
NEUTROS PCT: 72 %
Platelets: 228 10*3/uL (ref 150–400)
RBC: 3.73 MIL/uL — AB (ref 4.22–5.81)
RDW: 16.8 % — ABNORMAL HIGH (ref 11.5–15.5)
WBC: 13.2 10*3/uL — AB (ref 4.0–10.5)

## 2017-08-07 LAB — TYPE AND SCREEN
ABO/RH(D): O POS
Antibody Screen: NEGATIVE
Unit division: 0

## 2017-08-07 LAB — MAGNESIUM: Magnesium: 1.8 mg/dL (ref 1.7–2.4)

## 2017-08-07 MED ORDER — HEPARIN SODIUM (PORCINE) 5000 UNIT/ML IJ SOLN
5000.0000 [IU] | Freq: Three times a day (TID) | INTRAMUSCULAR | Status: DC
  Administered 2017-08-07 – 2017-08-14 (×21): 5000 [IU] via SUBCUTANEOUS
  Filled 2017-08-07 (×23): qty 1

## 2017-08-07 MED ORDER — POTASSIUM CHLORIDE 20 MEQ/15ML (10%) PO SOLN
60.0000 meq | Freq: Once | ORAL | Status: AC
  Administered 2017-08-07: 60 meq via ORAL
  Filled 2017-08-07: qty 45

## 2017-08-07 MED ORDER — MAGNESIUM SULFATE 50 % IJ SOLN
3.0000 g | Freq: Once | INTRAVENOUS | Status: AC
  Administered 2017-08-07: 3 g via INTRAVENOUS
  Filled 2017-08-07: qty 6

## 2017-08-07 NOTE — Progress Notes (Signed)
Subjective: Candidal sepsis with right hydro.  He was evaluated by Dr. Kathlene Cote and with the improved Cr he recommended a renal US to reassess the right hydro.  It had largely resolved so the NT was not placed.   His Cr is down to 0.64 and his Tmax is 99.8.     ROS:  Review of Systems  Unable to perform ROS: Intubated    Anti-infectives: Anti-infectives (From admission, onward)   Start     Dose/Rate Route Frequency Ordered Stop   08/06/17 2200  ceftazidime-avibactam (AVYCAZ) 2.5 g in dextrose 5 % 50 mL IVPB  Status:  Discontinued     2.5 g 25 mL/hr over 2 Hours Intravenous Every 8 hours 08/06/17 1431 08/06/17 1547   08/06/17 1700  cefTAZidime (FORTAZ) 2 g in dextrose 5 % 50 mL IVPB     2 g 100 mL/hr over 30 Minutes Intravenous Every 8 hours 08/06/17 1611 08/14/17 2359   08/06/17 0400  anidulafungin (ERAXIS) 100 mg in sodium chloride 0.9 % 100 mL IVPB     100 mg 78 mL/hr over 100 Minutes Intravenous Every 24 hours 08/05/17 0349     08/05/17 0400  anidulafungin (ERAXIS) 200 mg in sodium chloride 0.9 % 200 mL IVPB     200 mg 78 mL/hr over 200 Minutes Intravenous  Once 08/05/17 0348 08/05/17 0838   08/04/17 1400  ceftazidime-avibactam (AVYCAZ) 1.25 g in dextrose 5 % 50 mL IVPB  Status:  Discontinued     1.25 g 25 mL/hr over 2 Hours Intravenous Every 8 hours 08/04/17 0838 08/06/17 1431   08/02/17 2100  vancomycin (VANCOCIN) IVPB 750 mg/150 ml premix  Status:  Discontinued     750 mg 150 mL/hr over 60 Minutes Intravenous Every 24 hours 08/02/17 1409 08/02/17 1630   08/02/17 2100  ceFEPIme (MAXIPIME) 1 g in dextrose 5 % 50 mL IVPB  Status:  Discontinued     1 g 100 mL/hr over 30 Minutes Intravenous Every 24 hours 08/02/17 1409 08/02/17 1501   08/02/17 1800  linezolid (ZYVOX) IVPB 600 mg  Status:  Discontinued     600 mg 300 mL/hr over 60 Minutes Intravenous Every 12 hours 08/02/17 1629 08/04/17 1452   08/02/17 1600  ceftazidime-avibactam (AVYCAZ) 0.94 g in dextrose 5 % 50 mL IVPB   Status:  Discontinued     0.94 g 25 mL/hr over 2 Hours Intravenous Every 12 hours 08/02/17 1501 08/04/17 0838   08/01/17 1830  ceFEPIme (MAXIPIME) 2 g in dextrose 5 % 50 mL IVPB     2 g 100 mL/hr over 30 Minutes Intravenous  Once 08/01/17 1819 08/01/17 2045   08/01/17 1745  vancomycin (VANCOCIN) 1,500 mg in sodium chloride 0.9 % 500 mL IVPB     1,500 mg 250 mL/hr over 120 Minutes Intravenous  Once 08/01/17 1739 08/01/17 2242   08/01/17 1730  piperacillin-tazobactam (ZOSYN) IVPB 3.375 g  Status:  Discontinued     3.375 g 100 mL/hr over 30 Minutes Intravenous  Once 08/01/17 1723 08/01/17 1818   08/01/17 1730  vancomycin (VANCOCIN) IVPB 1000 mg/200 mL premix  Status:  Discontinued     1,000 mg 200 mL/hr over 60 Minutes Intravenous  Once 08/01/17 1723 08/01/17 1739      Current Facility-Administered Medications  Medication Dose Route Frequency Provider Last Rate Last Dose  . acetaminophen (TYLENOL) tablet 650 mg  650 mg Oral Q4H PRN Mannam, Praveen, MD   650 mg at 08/04/17 0032  . anidulafungin (ERAXIS) 100 mg  in sodium chloride 0.9 % 100 mL IVPB  100 mg Intravenous Q24H Chesley Mires, MD 78 mL/hr at 08/07/17 0515 100 mg at 08/07/17 0515  . cefTAZidime (FORTAZ) 2 g in dextrose 5 % 50 mL IVPB  2 g Intravenous [address] [place] 100 mL/hr at 08/07/17 0856 2 g at 08/07/17 0856  . chlorhexidine gluconate (MEDLINE KIT) (PERIDEX) 0.12 % solution 15 mL  15 mL Mouth Rinse BID Jennelle Human B, NP   15 mL at 08/07/17 0720  . diltiazem (CARDIZEM) 10 mg/ml oral suspension 30 mg  30 mg Per Tube Q6H Erick Colace, NP   30 mg at 08/07/17 0507  . feeding supplement (VITAL AF 1.2 CAL) liquid 1,000 mL  1,000 mL Per Tube Continuous Allie Bossier, MD 70 mL/hr at 08/07/17 0700 1,000 mL at 08/07/17 0700  . fentaNYL (SUBLIMAZE) injection 100 mcg  100 mcg Intravenous Q15 min PRN Mannam, Praveen, MD      . fentaNYL (SUBLIMAZE) injection 100 mcg  100 mcg Intravenous Q2H PRN Mannam, Praveen, MD      . free  water 250 mL  250 mL Per Tube Q4H Erick Colace, NP   250 mL at 08/07/17 0721  . ipratropium-albuterol (DUONEB) 0.5-2.5 (3) MG/3ML nebulizer solution 3 mL  3 mL Nebulization Q6H Jennelle Human B, NP   3 mL at 08/07/17 0727  . levothyroxine (SYNTHROID, LEVOTHROID) tablet 125 mcg  125 mcg Per Tube QAC breakfast Aldean Jewett, MD   125 mcg at 08/07/17 0859  . lidocaine (XYLOCAINE) 2 % viscous mouth solution    PRN Sandi Mariscal, MD   5 mL at 08/03/17 1629  . MEDLINE mouth rinse  15 mL Mouth Rinse QID Jennelle Human B, NP   15 mL at 08/07/17 0445  . pantoprazole sodium (PROTONIX) 40 mg/20 mL oral suspension 40 mg  40 mg Per Tube Daily Harvel Quale, RPH   40 mg at 08/06/17 2116  . senna-docusate (Senokot-S) tablet 1 tablet  1 tablet Per Tube QHS Mannam, Praveen, MD   1 tablet at 08/06/17 2116  . sodium chloride 0.9 % bolus 1,000 mL  1,000 mL Intravenous Once Jennelle Human B, NP         Objective: Vital signs in last 24 hours: Temp:  [98.3 F (36.8 C)-99.8 F (37.7 C)] 98.3 F (36.8 C) (12/02 0832) Pulse Rate:  [31-146] 97 (12/02 0900) Resp:  [20-35] 35 (12/02 0900) BP: (93-145)/(47-83) 108/50 (12/02 0900) SpO2:  [86 %-100 %] 100 % (12/02 0900) FiO2 (%):  [40 %] 40 % (12/02 0800) Weight:  [76.6 kg (168 lb 14 oz)] 76.6 kg (168 lb 14 oz) (12/02 0500)  Intake/Output from previous day: 12/01 0701 - 12/02 0700 In: 2845 [I.V.:250; Blood:348; TR/RN:1657; IV Piggyback:100] Out: 2625 [Urine:2625] Intake/Output this shift: Total I/O In: 70 [NG/GT:70] Out: -    Physical Exam  Lab Results:  Recent Labs    08/06/17 0533 08/07/17 0705  WBC 11.2* 13.2*  HGB 7.4* 9.0*  HCT 25.4* 30.2*  PLT 184 228   BMET Recent Labs    08/06/17 0533 08/07/17 0508  NA 148* 149*  K 3.3* 2.3*  CL 116* 128*  CO2 25 17*  GLUCOSE 109* 71  BUN 43* 21*  CREATININE 1.28* 0.64  CALCIUM 8.0* 4.7*   PT/INR Recent Labs    08/07/17 0508  LABPROT 17.3*  INR 1.43   ABG No results for input(s):  PHART, HCO3 in the last 72 hours.  Invalid input(s): PCO2, PO2  Studies/Results: US Renal  Result Date: 08/06/2017 CLINICAL DATA:  Right-sided hydronephrosis seen on recent CT scan. EXAM: RENAL / URINARY TRACT ULTRASOUND COMPLETE COMPARISON:  CT scan August 04, 2017 FINDINGS: Right Kidney: Length: 15 cm. The previously identified right-sided hydronephrosis has almost completely resolved. The right kidney is otherwise normal in appearance. Left Kidney: Length: 14.2 cm. Contains 2 masses, both cystic in nature. One of the masses is in the renal hilum, likely a parapelvic cyst. No hydronephrosis identified. Bladder: Decompressed with a Foley catheter. IMPRESSION: 1. Cholelithiasis. The gallbladder wall is thickened to 5 mm. Dedicated ultrasound of the gallbladder and/or a HIDA scan may be helpful if there is concern for acute cholecystitis. 2. The previously identified right-sided hydronephrosis has almost completely resolved. Minimal prominence of the calices. 3. Left renal cysts, incompletely characterized. 4. Decompression of the bladder with a Foley catheter. Electronically Signed   By: Dorise Bullion III M.D   On: 08/06/2017 15:03   Dg Chest Port 1 View  Result Date: 08/06/2017 CLINICAL DATA:  Pneumonia EXAM: PORTABLE CHEST 1 VIEW COMPARISON:  08/05/2017 FINDINGS: Tracheostomy is unchanged. Right PICC line has been removed. Cardiomegaly with vascular congestion. Airspace disease throughout the left lung, most pronounced in the left lung base. Aeration of the left lung slightly worsened since prior study. No confluent opacity on the right. Possible small left effusion. IMPRESSION: Left lung airspace disease, most confluent in the left lower lobe, slightly increased since prior study. Suspect small left effusion. Electronically Signed   By: Rolm Baptise M.D.   On: 08/06/2017 07:12     Assessment and Plan: Candidal sepsis with right hydro and AKI improving on current therapy and foley drainage. The  Cr has normalized and the hydro largely resolved.  NT no longer indicated.      LOS: 6 days    Craig Oneal 08/07/2017 [phone] ID: Alvy Bimler Durel Salts., male   DOB: [birth year], 66 y.o.   MRN: [id number]

## 2017-08-07 NOTE — Progress Notes (Signed)
PROGRESS NOTE    Craig Oneal.  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 Blood cultures sent and started on cefepime 11/26 and amikacin 11/27.  Additionally, daughter reports patient normally requires ventilator at night normally but has required continously for the last two days.     Patient has been at [facility] since February 2018; prior to was at [facility].  Hospitalized at [facility] in 01/2017 with proteus bacteremia, septic and hemorrhagic shock.  Additionally has history of serratia and pseudomonas from tracheal aspirate in July, both sensitive to Cefepime, as well as enterobacter and morganella.  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      Subjective: 12/2 eyes open. Alert. Continues to attempt to mouth answers unsure of how much patient actually understands concerning his care. Quadriplegia.   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 -Continue to wean on trach collar, -Nighttime vent per respiratory. -Frequent suctioning PRN -Continue  long-term antibiotic/antifungal per ID  Funguria -continue antifungals per ID -see acute on CKD stage III  Hydronephrosis -12/2 per repeat ultrasound on 12/1 hydronephrosis has resolved  H/o CRE per [facility]  Fungemia positive candida glabrata, -Antifungals per ID -Needs optho consult for Endopthalmitis: Will need to obtain as an outpatient.   -If repeat blood cultures negative will have PICC line placed on Monday 12/3. Will continue antifungals/antibiotics (final recommendations) per ID.   Chronic systolic CHF -Strict in and out -Daily weight -Cardizem 30 mg QID -Transfuse for hemoglobin<8  Pulmonary HTN  Chronic Atrial Fibrillation -Prior to admission was on warfarin held on admission. -Patient currently only on BTE prophylaxis heparin secondary to acute renal hemorrhage. On 12/3 reconsult urology when/if it will be safe to restart anticoagulation  Acute on CKD stage III  -Patient with chronic Foley -RIGHT renal collecting system hemorrhage -Secondary to bilateral hydronephrosis, bacteremia. Per urology note appears   -Per urology believed source of bacteremia (Candida Glabrata from GU tract specifically RIGHT kidney).  -Urology recommends RIGHT nephrostomy tube placement. I discussed case with Dr. Irine Seal Urology will order placement of tube for today or tomorrow. Currently urologist does not feel left nephrostomy tube required. -12/2 per Dr. Irine Seal Urology note after further evaluation placement of RIGHT Nephrostomy tube canceled see note. -Restart Heparin  -Restart tube feeds  Hypernatremia  Hypokalemia  -Potassium goal > 4 -Potassium 60 mEq: Repeat K /Mg @1200    Hypomagnesemia -Magnesium goal> 2 -Magnesium IV 3 g  Chronic foley  Cholecystitis? -Per renal ultrasound patient with possible cholecystitis given patient's quadriplegia and inability to communicate will obtain renal ultrasound per radiology  recommendation  Dysphagia -NPO -Gtube-->malpositioned > replaced by IR -  Transaminitis - appears chronic based on May 2018 labs -12/2 Restart vital AF 1.2 CAL 70 ml/hr   Acute blood loss anemia  -presumably from area noted on CT scan from right renal collecting system noting concern for hemorrhage -11/27 transfused 2 units PRBC -12/1 transfuse 1 unit PRBC  -Transfuse for hemoglobin<8 Recent Labs  Lab 08/02/17 1745 08/03/17 0334 08/04/17 0444 08/05/17 0533 08/06/17 0533 08/07/17 0705  HGB 8.3* 8.0* 7.5* 7.2* 7.4* 9.0*      Hypothyroidism  -Synthroid 125 g daily    Acute encephalopathy - Infection/sepsis ? -Hx cerebral atrophy, paraplegia w/ataxia -Continue supportive care -Unsure of baseline:       DVT prophylaxis: SCD--> heparin subcutaneous Code Status: Full Family Communication: None Disposition Plan: TBD   Consultants:  [facility] ID     Procedures/Significant Events:   STUDIES:  11/26 CXR >> bilateral infiltrates vs edema 11/27 CXR >> stable bilateral opacities 11/27 Echocardiogram :  LVEF = 40-45% with global hypokinesis and inferior basal akinesis.   -RIGHT Atrium: moderate tricuspid valve regurg.  -Tricuspid valve: Moderate regurgitation -Pulmonary arteries PA peak pressure 51 mmHg   11/27 CT chest and abdomen : Percutaneous gastrostomy tube retracted into the abdominal wall.  Radiologist reports what appears to be mature tract to the gastric lumen given oral contrast injected through catheter did not extravasate.  There is no pneumoperitoneum or fluid collection.  Advanced bilateral hydronephrosis with upper hydroureter.  High density in the right renal collecting system consistent with hemorrhage.  Left greater than right lower lobe airspace disease. 11/29 CT abdomen pelvis W contrast:-Gastrostomy tube appears be in good position w/ocomplicating features. - Small bilateral pleural effusions and bibasilar atelectasis. -Cholelithiasis without  definite CT findings for acute cholecystitis. - Right-sided hydroureteronephrosis without obvious cause.  12/1 Renal ultrasound:-Cholelithiasis. -Gallbladder wall is thickened to 5 mm. Dedicated ultrasound of the gallbladder and/or a HIDA scan may be helpful if there is concern for acute cholecystitis. -Previously identified right-sided hydronephrosis has almost completely resolved.  -Left renal cysts, incompletely characterized.        I have personally reviewed and interpreted all radiology studies and my findings are as above.  VENTILATOR SETTINGS: Trach collar FiO2: 40%   Cultures Prior cultures at [facility] 11/23 ? 11/26 blood 2/2 positive coag negative staph  11/26 blood positive Candida Glabrata 11/26 Trach Aspirate: Positive Pseudomonas Aeruginosa  11/26 UC positive 30,000 colonies yeast 11/27 MRSA by PCR negative  11/28 blood positive yeast 12/1 blood pending   Antimicrobials: Anti-infectives (From admission, onward)   Start     Stop   08/06/17 2200  ceftazidime-avibactam (AVYCAZ) 2.5 g in dextrose 5 % 50 mL IVPB  Status:  Discontinued     08/06/17 1547   08/06/17 1700  cefTAZidime (FORTAZ) 2 g in dextrose 5 % 50 mL IVPB     08/14/17 2359   08/06/17 0400  anidulafungin (ERAXIS) 100 mg in sodium chloride 0.9 % 100 mL IVPB         08/05/17 0400  anidulafungin (ERAXIS) 200 mg in sodium chloride 0.9 % 200 mL IVPB     08/05/17 0838   08/04/17 1400  ceftazidime-avibactam (AVYCAZ) 1.25 g in dextrose 5 % 50 mL IVPB  Status:  Discontinued     08/06/17 1431   08/02/17 2100  vancomycin (VANCOCIN) IVPB 750 mg/150 ml premix  Status:  Discontinued     08/02/17 1630   08/02/17 2100  ceFEPIme (MAXIPIME) 1 g in dextrose 5 % 50 mL IVPB  Status:  Discontinued     08/02/17 1501   08/02/17 1800  linezolid (ZYVOX) IVPB 600 mg  Status:  Discontinued     08/04/17 1452   08/02/17 1600  ceftazidime-avibactam (AVYCAZ) 0.94 g in dextrose 5 % 50 mL IVPB  Status:  Discontinued     08/04/17  0838   08/01/17 1830  ceFEPIme (MAXIPIME) 2 g in dextrose 5 % 50 mL IVPB     08/01/17 2045   08/01/17 1745  vancomycin (VANCOCIN) 1,500 mg in sodium chloride 0.9 % 500 mL IVPB     08/01/17 2242   08/01/17 1730  piperacillin-tazobactam (ZOSYN) IVPB 3.375 g  Status:  Discontinued     08/01/17 1818   08/01/17 1730  vancomycin (VANCOCIN) IVPB 1000 mg/200 mL premix  Status:  Discontinued     08/01/17 1739       Devices   LINES / TUBES:  R PICC from [facility] -  Gastrostomy tube   Foley>> changed 11/26 >>  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 anidulafungin 100 mg (08/07/17 0515)  . cefTAZidime (FORTAZ)  IV Stopped (08/07/17 0058)  . feeding supplement (VITAL AF 1.2 CAL) 1,000 mL (08/07/17 0700)  . sodium chloride       Objective: Vitals:   08/07/17 0730 08/07/17 0743 08/07/17 0800 08/07/17 0832  BP:   (!) 126/47   Pulse: (!) 53  71   Resp: (!) 24  (!) 31   Temp:    98.3 F (36.8 C)  TempSrc:    Oral  SpO2: 100% 100% 100%   Weight:      Height:        Intake/Output Summary (Last 24 hours) at 08/07/2017 0853 Last data filed at 08/07/2017 0800 Gross per 24 hour  Intake 2575 ml  Output 2325 ml  Net 250 ml   Filed Weights   08/05/17 0500 08/06/17 0500 08/07/17 0500  Weight: 172 lb 9.9 oz (78.3 kg) 175 lb 14.8 oz (79.8 kg) 168 lb 14 oz (76.6 kg)    Physical Exam:  General: Eyes open, alert, attempts to monitor and just to question, positive acute respiratory distress No acute respiratory distress Neck:  Negative scars, masses, torticollis, lymphadenopathy, JVD, #7 cuffed trach in place, covered and clean, negative sign of infection. Lungs: Clear to auscultation bilaterally without wheezes or crackles Cardiovascular: Regular rate and rhythm without murmur gallop or rub normal S1 and S2 Abdomen: negative abdominal pain, nondistended, positive soft, bowel sounds, no rebound, no ascites, no appreciable mass Extremities: No significant cyanosis, clubbing,  or edema LLE. RIGHT AKA staples still in place well-healed, negative sign of infection, negative discharge  Skin: Negative rashes, lesions, ulcers Psychiatric:  Unable to assess secondary to patient being trach.  Central nervous system:  Quadriplegia, sensation intact grimaces to painful stimuli. Negative receptive aphasia  .     Data Reviewed: Care during the described time interval was provided by me .  I have reviewed this patient's available data, including medical history, events of note, physical examination, and all test results as part of my evaluation.   CBC: Recent Labs  Lab 08/01/17 1730  08/03/17 0334 08/04/17 0444 08/05/17 0533 08/06/17 0533 08/07/17 0705  WBC 17.0*   < > 15.2* 11.3* 10.2 11.2* 13.2*  NEUTROABS 14.2*  --   --   --   --   --  9.5*  HGB 8.3*   < > 8.0* 7.5* 7.2* 7.4* 9.0*  HCT 26.9*   < > 27.0* 25.8* 24.6*  25.4* 30.2*  MCV 77.7*   < > 79.2 78.9 79.6 80.4 81.0  PLT 154   < > 159 159 181 184 228   < > = values in this interval not displayed.   Basic Metabolic Panel: Recent Labs  Lab 08/03/17 1200 08/03/17 1450 08/03/17 1724 08/04/17 0444 08/04/17 1606 08/05/17 0533 08/06/17 0533 08/07/17 0508  NA 143  --   --  145  --  147* 148* 149*  K 3.6  --   --  3.4*  --  3.3* 3.3* 2.3*  CL 116*  --   --  118*  --  118* 116* 128*  CO2 20*  --   --  21*  --  23 25 17*  GLUCOSE 127*  --   --  155*  --  107* 109* 71  BUN 102*  --   --  89*  --  63* 43* 21*  CREATININE 2.41*  --   --  2.20*  --  1.66* 1.28* 0.64  CALCIUM 8.8*  --   --  8.6*  --  8.3* 8.0* 4.7*  MG  --  1.9 1.9 2.0 1.7 1.7 1.4*  --   PHOS 3.9 3.6 3.7 2.9 2.9 2.3*  --   --    GFR: Estimated Creatinine Clearance: 99.4 mL/min (by C-G formula based on SCr of 0.64 mg/dL). Liver Function Tests: Recent Labs  Lab 08/01/17 1730 08/02/17 1130 08/03/17 1200  AST 55* 39  --   ALT 71* 43  --   ALKPHOS 229* 153*  --   BILITOT 0.6 0.5  --   PROT 9.6* 7.2  --   ALBUMIN 2.3* 1.6* 1.6*   No  results for input(s): LIPASE, AMYLASE in the last 168 hours. No results for input(s): AMMONIA in the last 168 hours. Coagulation Profile: Recent Labs  Lab 08/01/17 2146 08/07/17 0508  INR 1.18 1.43   Cardiac Enzymes: No results for input(s): CKTOTAL, CKMB, CKMBINDEX, TROPONINI in the last 168 hours. BNP (last 3 results) No results for input(s): PROBNP in the last 8760 hours. HbA1C: No results for input(s): HGBA1C in the last 72 hours. CBG: Recent Labs  Lab 08/06/17 1633 08/06/17 1956 08/06/17 2341 08/07/17 0358 08/07/17 0834  GLUCAP 108* 104* 84 99 109*   Lipid Profile: No results for input(s): CHOL, HDL, LDLCALC, TRIG, CHOLHDL, LDLDIRECT in the last 72 hours. Thyroid Function Tests: No results for input(s): TSH, T4TOTAL, FREET4, T3FREE, THYROIDAB in the last 72 hours. Anemia Panel: No results for input(s): VITAMINB12, FOLATE, FERRITIN, TIBC, IRON, RETICCTPCT in the last 72 hours. Urine analysis:    Component Value Date/Time   COLORURINE YELLOW 08/01/2017 2051   APPEARANCEUR CLOUDY (A) 08/01/2017 2051   LABSPEC 1.020 08/01/2017 2051   PHURINE 5.5 08/01/2017 2051   GLUCOSEU NEGATIVE 08/01/2017 2051   HGBUR LARGE (A) 08/01/2017 2051   BILIRUBINUR NEGATIVE 08/01/2017 2051   [place] 08/01/2017 2051   PROTEINUR 100 (A) 08/01/2017 2051   NITRITE NEGATIVE 08/01/2017 2051   LEUKOCYTESUR MODERATE (A) 08/01/2017 2051   Sepsis Labs: @LABRCNTIP (procalcitonin:4,lacticidven:4)  ) Recent Results (from the past 240 hour(s))  Blood Culture (routine x 2)     Status: Abnormal   Collection Time: 08/01/17  5:22 PM  Result Value Ref Range Status   Specimen Description BLOOD LEFT HAND  Final   Special Requests   Final    Blood Culture adequate volume BOTTLES DRAWN AEROBIC AND ANAEROBIC   Culture  Setup Time   Final  GRAM POSITIVE COCCI IN CLUSTERS ANAEROBIC BOTTLE ONLY CRITICAL RESULT CALLED TO, READ BACK BY AND VERIFIED WITH: PHARMD G ABBOTT [phone]  MLM YEAST AEROBIC BOTTLE ONLY CRITICAL RESULT CALLED TO, READ BACK BY AND VERIFIED WITH: K WEIGLE PHARMD 0330 08/05/17 A BROWNING    Culture (A)  Final    STAPHYLOCOCCUS SPECIES (COAGULASE NEGATIVE) THE SIGNIFICANCE OF ISOLATING THIS ORGANISM FROM A SINGLE SET OF BLOOD CULTURES WHEN MULTIPLE SETS ARE DRAWN IS UNCERTAIN. PLEASE NOTIFY THE MICROBIOLOGY DEPARTMENT WITHIN ONE WEEK IF SPECIATION AND SENSITIVITIES ARE REQUIRED. CANDIDA GLABRATA CORRECTED ON 12/01 AT 0840: PREVIOUSLY REPORTED AS YEAST    Report Status 08/07/2017 FINAL  Final  Blood Culture ID Panel (Reflexed)     Status: Abnormal   Collection Time: 08/01/17  5:22 PM  Result Value Ref Range Status   Enterococcus species NOT DETECTED NOT DETECTED Final   Listeria monocytogenes NOT DETECTED NOT DETECTED Final   Staphylococcus species DETECTED (A) NOT DETECTED Final    Comment: Methicillin (oxacillin) resistant coagulase negative staphylococcus. Possible blood culture contaminant (unless isolated from more than one blood culture draw or clinical case suggests pathogenicity). No antibiotic treatment is indicated for blood  culture contaminants. CRITICAL RESULT CALLED TO, READ BACK BY AND VERIFIED WITH: PHARMD G ABBOTT [phone] MLM    Staphylococcus aureus NOT DETECTED NOT DETECTED Final   Methicillin resistance DETECTED (A) NOT DETECTED Final    Comment: CRITICAL RESULT CALLED TO, READ BACK BY AND VERIFIED WITH: PHARMD G ABBOTT [id number] MLM    Streptococcus species NOT DETECTED NOT DETECTED Final   Streptococcus agalactiae NOT DETECTED NOT DETECTED Final   Streptococcus pneumoniae NOT DETECTED NOT DETECTED Final   Streptococcus pyogenes NOT DETECTED NOT DETECTED Final   Acinetobacter baumannii NOT DETECTED NOT DETECTED Final   Enterobacteriaceae species NOT DETECTED NOT DETECTED Final   Enterobacter cloacae complex NOT DETECTED NOT DETECTED Final   Escherichia coli NOT DETECTED NOT DETECTED Final   Klebsiella oxytoca NOT  DETECTED NOT DETECTED Final   Klebsiella pneumoniae NOT DETECTED NOT DETECTED Final   Proteus species NOT DETECTED NOT DETECTED Final   Serratia marcescens NOT DETECTED NOT DETECTED Final   Haemophilus influenzae NOT DETECTED NOT DETECTED Final   Neisseria meningitidis NOT DETECTED NOT DETECTED Final   Pseudomonas aeruginosa NOT DETECTED NOT DETECTED Final   Candida albicans NOT DETECTED NOT DETECTED Final   Candida glabrata NOT DETECTED NOT DETECTED Final   Candida krusei NOT DETECTED NOT DETECTED Final   Candida parapsilosis NOT DETECTED NOT DETECTED Final   Candida tropicalis NOT DETECTED NOT DETECTED Final  Blood Culture ID Panel (Reflexed)     Status: Abnormal   Collection Time: 08/01/17  5:22 PM  Result Value Ref Range Status   Enterococcus species NOT DETECTED NOT DETECTED Final   Listeria monocytogenes NOT DETECTED NOT DETECTED Final   Staphylococcus species NOT DETECTED NOT DETECTED Final   Staphylococcus aureus NOT DETECTED NOT DETECTED Final   Streptococcus species NOT DETECTED NOT DETECTED Final   Streptococcus agalactiae NOT DETECTED NOT DETECTED Final   Streptococcus pneumoniae NOT DETECTED NOT DETECTED Final   Streptococcus pyogenes NOT DETECTED NOT DETECTED Final   Acinetobacter baumannii NOT DETECTED NOT DETECTED Final   Enterobacteriaceae species NOT DETECTED NOT DETECTED Final   Enterobacter cloacae complex NOT DETECTED NOT DETECTED Final   Escherichia coli NOT DETECTED NOT DETECTED Final   Klebsiella oxytoca NOT DETECTED NOT DETECTED Final   Klebsiella pneumoniae NOT DETECTED NOT DETECTED Final   Proteus species  NOT DETECTED NOT DETECTED Final   Serratia marcescens NOT DETECTED NOT DETECTED Final   Haemophilus influenzae NOT DETECTED NOT DETECTED Final   Neisseria meningitidis NOT DETECTED NOT DETECTED Final   Pseudomonas aeruginosa NOT DETECTED NOT DETECTED Final   Candida albicans NOT DETECTED NOT DETECTED Final   Candida glabrata DETECTED (A) NOT DETECTED  Final    Comment: CRITICAL RESULT CALLED TO, READ BACK BY AND VERIFIED WITH: K WEIGLE PHARMD 0330 08/05/17 A BROWNING    Candida krusei NOT DETECTED NOT DETECTED Final   Candida parapsilosis NOT DETECTED NOT DETECTED Final   Candida tropicalis NOT DETECTED NOT DETECTED Final  Blood Culture (routine x 2)     Status: None   Collection Time: 08/01/17  7:05 PM  Result Value Ref Range Status   Specimen Description BLOOD RIGHT PICC LINE  Final   Special Requests IN PEDIATRIC BOTTLE Blood Culture adequate volume  Final   Culture NO GROWTH 5 DAYS  Final   Report Status 08/06/2017 FINAL  Final  Culture, respiratory (tracheal aspirate)     Status: None   Collection Time: 08/01/17 11:56 PM  Result Value Ref Range Status   Specimen Description TRACHEAL ASPIRATE  Final   Special Requests NONE  Final   Gram Stain   Final    ABUNDANT WBC PRESENT,BOTH PMN AND MONONUCLEAR MODERATE GRAM NEGATIVE RODS RARE GRAM POSITIVE RODS    Culture FEW PSEUDOMONAS AERUGINOSA  Final   Report Status 08/06/2017 FINAL  Final   Organism ID, Bacteria PSEUDOMONAS AERUGINOSA  Final      Susceptibility   Pseudomonas aeruginosa - MIC*    CEFTAZIDIME 4 SENSITIVE Sensitive     CIPROFLOXACIN <=0.25 SENSITIVE Sensitive     GENTAMICIN 8 INTERMEDIATE Intermediate     IMIPENEM >=16 RESISTANT Resistant     PIP/TAZO 16 SENSITIVE Sensitive     CEFEPIME 16 INTERMEDIATE Intermediate     * FEW PSEUDOMONAS AERUGINOSA  Urine culture     Status: Abnormal   Collection Time: 08/01/17 11:56 PM  Result Value Ref Range Status   Specimen Description URINE, CATHETERIZED  Final   Special Requests NONE  Final   Culture 30,000 COLONIES/mL YEAST (A)  Final   Report Status 08/03/2017 FINAL  Final  MRSA PCR Screening     Status: None   Collection Time: 08/02/17  1:05 AM  Result Value Ref Range Status   MRSA by PCR NEGATIVE NEGATIVE Final    Comment:        The GeneXpert MRSA Assay (FDA approved for NASAL specimens only), is one  component of a comprehensive MRSA colonization surveillance program. It is not intended to diagnose MRSA infection nor to guide or monitor treatment for MRSA infections.   Culture, blood (Routine X 2) w Reflex to ID Panel     Status: None (Preliminary result)   Collection Time: 08/03/17  3:16 PM  Result Value Ref Range Status   Specimen Description BLOOD LEFT HAND  Final   Special Requests IN PEDIATRIC BOTTLE Blood Culture adequate volume  Final   Culture NO GROWTH 3 DAYS  Final   Report Status PENDING  Incomplete  Culture, blood (Routine X 2) w Reflex to ID Panel     Status: Abnormal (Preliminary result)   Collection Time: 08/03/17  3:28 PM  Result Value Ref Range Status   Specimen Description BLOOD LEFT HAND  Final   Special Requests IN PEDIATRIC BOTTLE Blood Culture adequate volume  Final   Culture  Setup Time  Final    YEAST IN PEDIATRIC BOTTLE CRITICAL VALUE NOTED.  VALUE IS CONSISTENT WITH PREVIOUSLY REPORTED AND CALLED VALUE.    Culture YEAST (A)  Final   Report Status PENDING  Incomplete         Radiology Studies: US Renal  Result Date: 08/06/2017 CLINICAL DATA:  Right-sided hydronephrosis seen on recent CT scan. EXAM: RENAL / URINARY TRACT ULTRASOUND COMPLETE COMPARISON:  CT scan August 04, 2017 FINDINGS: Right Kidney: Length: 15 cm. The previously identified right-sided hydronephrosis has almost completely resolved. The right kidney is otherwise normal in appearance. Left Kidney: Length: 14.2 cm. Contains 2 masses, both cystic in nature. One of the masses is in the renal hilum, likely a parapelvic cyst. No hydronephrosis identified. Bladder: Decompressed with a Foley catheter. IMPRESSION: 1. Cholelithiasis. The gallbladder wall is thickened to 5 mm. Dedicated ultrasound of the gallbladder and/or a HIDA scan may be helpful if there is concern for acute cholecystitis. 2. The previously identified right-sided hydronephrosis has almost completely resolved. Minimal  prominence of the calices. 3. Left renal cysts, incompletely characterized. 4. Decompression of the bladder with a Foley catheter. Electronically Signed   By: Dorise Bullion III M.D   On: 08/06/2017 15:03   Dg Chest Port 1 View  Result Date: 08/06/2017 CLINICAL DATA:  Pneumonia EXAM: PORTABLE CHEST 1 VIEW COMPARISON:  08/05/2017 FINDINGS: Tracheostomy is unchanged. Right PICC line has been removed. Cardiomegaly with vascular congestion. Airspace disease throughout the left lung, most pronounced in the left lung base. Aeration of the left lung slightly worsened since prior study. No confluent opacity on the right. Possible small left effusion. IMPRESSION: Left lung airspace disease, most confluent in the left lower lobe, slightly increased since prior study. Suspect small left effusion. Electronically Signed   By: Rolm Baptise M.D.   On: 08/06/2017 07:12        Scheduled Meds: . chlorhexidine gluconate (MEDLINE KIT)  15 mL Mouth Rinse BID  . diltiazem  30 mg Per Tube Q6H  . free water  250 mL Per Tube Q4H  . ipratropium-albuterol  3 mL Nebulization Q6H  . levothyroxine  125 mcg Per Tube QAC breakfast  . mouth rinse  15 mL Mouth Rinse QID  . pantoprazole sodium  40 mg Per Tube Daily  . senna-docusate  1 tablet Per Tube QHS   Continuous Infusions: . anidulafungin 100 mg (08/07/17 0515)  . cefTAZidime (FORTAZ)  IV Stopped (08/07/17 0058)  . feeding supplement (VITAL AF 1.2 CAL) 1,000 mL (08/07/17 0700)  . sodium chloride       LOS: 6 days    Time spent: 40 minutes    Kyon Bentler, Geraldo Docker, MD [facility] Pager [phone]   If 7PM-7AM, please contact night-coverage [email or web address] Password [facility] 08/07/2017, 8:53 AM

## 2017-08-07 NOTE — Progress Notes (Signed)
eLink Physician-Brief Progress Note Patient Name: Craig DiegoCharlie J Mota Jr. DOB: 02-09-1951 MRN: [id number]   Date of Service  08/07/2017  HPI/Events of Note  Request for CBC this AM. This patient is now a [facility] primary patient.   eICU Interventions  Will order: 1. CBC with Platelets this AM. 2. [facility].      Intervention Category Minor Interventions: Clinical assessment - ordering diagnostic tests  Lenell AntuSommer,Steven Eugene 08/07/2017, 6:31 AM

## 2017-08-08 DIAGNOSIS — R0902 Hypoxemia: Secondary | ICD-10-CM

## 2017-08-08 DIAGNOSIS — B377 Candidal sepsis: Secondary | ICD-10-CM | POA: Diagnosis present

## 2017-08-08 DIAGNOSIS — J9621 Acute and chronic respiratory failure with hypoxia: Secondary | ICD-10-CM

## 2017-08-08 DIAGNOSIS — R1310 Dysphagia, unspecified: Secondary | ICD-10-CM

## 2017-08-08 DIAGNOSIS — Z930 Tracheostomy status: Secondary | ICD-10-CM

## 2017-08-08 LAB — GLUCOSE, CAPILLARY
GLUCOSE-CAPILLARY: 98 mg/dL (ref 65–99)
GLUCOSE-CAPILLARY: 119 mg/dL — AB (ref 65–99)
Glucose-Capillary: 109 mg/dL — ABNORMAL HIGH (ref 65–99)
Glucose-Capillary: 109 mg/dL — ABNORMAL HIGH (ref 65–99)
Glucose-Capillary: 117 mg/dL — ABNORMAL HIGH (ref 65–99)

## 2017-08-08 LAB — CULTURE, BLOOD (ROUTINE X 2)
Culture: NO GROWTH
SPECIAL REQUESTS: ADEQUATE

## 2017-08-08 LAB — CBC WITH DIFFERENTIAL/PLATELET
Basophils Absolute: 0 10*3/uL (ref 0.0–0.1)
Basophils Relative: 0 %
EOS ABS: 0.3 10*3/uL (ref 0.0–0.7)
EOS PCT: 2 %
HCT: 30.7 % — ABNORMAL LOW (ref 39.0–52.0)
Hemoglobin: 8.9 g/dL — ABNORMAL LOW (ref 13.0–17.0)
LYMPHS ABS: 2.7 10*3/uL (ref 0.7–4.0)
Lymphocytes Relative: 21 %
MCH: 24.1 pg — AB (ref 26.0–34.0)
MCHC: 29 g/dL — AB (ref 30.0–36.0)
MCV: 83 fL (ref 78.0–100.0)
MONOS PCT: 4 %
Monocytes Absolute: 0.4 10*3/uL (ref 0.1–1.0)
NEUTROS ABS: 9.3 10*3/uL — AB (ref 1.7–7.7)
NEUTROS PCT: 73 %
PLATELETS: 245 10*3/uL (ref 150–400)
RBC: 3.7 MIL/uL — ABNORMAL LOW (ref 4.22–5.81)
RDW: 17.5 % — ABNORMAL HIGH (ref 11.5–15.5)
WBC: 12.7 10*3/uL — AB (ref 4.0–10.5)

## 2017-08-08 LAB — BASIC METABOLIC PANEL
Anion gap: 6 (ref 5–15)
BUN: 28 mg/dL — AB (ref 6–20)
CALCIUM: 7.9 mg/dL — AB (ref 8.9–10.3)
CO2: 27 mmol/L (ref 22–32)
CREATININE: 0.96 mg/dL (ref 0.61–1.24)
Chloride: 116 mmol/L — ABNORMAL HIGH (ref 101–111)
GFR calc Af Amer: >60 mL/min (ref >60)
Glucose, Bld: 109 mg/dL — ABNORMAL HIGH (ref 65–99)
Potassium: 3.7 mmol/L (ref 3.5–5.1)
SODIUM: 149 mmol/L — AB (ref 135–145)

## 2017-08-08 MED ORDER — DILTIAZEM 12 MG/ML ORAL SUSPENSION
60.0000 mg | Freq: Four times a day (QID) | ORAL | Status: DC
  Administered 2017-08-08 – 2017-08-13 (×21): 60 mg
  Filled 2017-08-08 (×27): qty 6

## 2017-08-08 MED ORDER — SODIUM CHLORIDE 0.9% FLUSH: 10.0000 mL | Freq: Two times a day (BID) | INTRAVENOUS | Status: DC

## 2017-08-08 MED ORDER — BACLOFEN 10 MG PO TABS
5.0000 mg | ORAL_TABLET | Freq: Two times a day (BID) | ORAL | Status: DC
  Administered 2017-08-08 – 2017-08-20 (×25): 5 mg
  Filled 2017-08-08 (×27): qty 1

## 2017-08-08 MED ORDER — ATORVASTATIN CALCIUM 10 MG PO TABS
10.0000 mg | ORAL_TABLET | Freq: Every day | ORAL | Status: DC
  Administered 2017-08-08 – 2017-08-19 (×12): 10 mg
  Filled 2017-08-08 (×14): qty 1

## 2017-08-08 MED ORDER — SODIUM CHLORIDE 0.9% FLUSH: 10.0000 mL | INTRAVENOUS | Status: DC | PRN

## 2017-08-08 MED ORDER — IPRATROPIUM-ALBUTEROL 0.5-2.5 (3) MG/3ML IN SOLN: 3.0000 mL | RESPIRATORY_TRACT | Status: DC | PRN

## 2017-08-08 MED ORDER — METOPROLOL TARTRATE 12.5 MG HALF TABLET
12.5000 mg | ORAL_TABLET | Freq: Two times a day (BID) | ORAL | Status: DC
  Administered 2017-08-08 – 2017-08-20 (×23): 12.5 mg
  Filled 2017-08-08 (×26): qty 1

## 2017-08-08 MED ORDER — BACLOFEN 5 MG HALF TABLET
5.0000 mg | ORAL_TABLET | Freq: Two times a day (BID) | ORAL | Status: DC
  Filled 2017-08-08 (×2): qty 1

## 2017-08-08 MED ORDER — POTASSIUM CL IN DEXTROSE 5% 20 MEQ/L IV SOLN
20.0000 meq | INTRAVENOUS | Status: DC
  Administered 2017-08-08: 20 meq via INTRAVENOUS
  Filled 2017-08-08 (×2): qty 1000

## 2017-08-08 MED ORDER — FENTANYL CITRATE (PF) 100 MCG/2ML IJ SOLN
25.0000 ug | INTRAMUSCULAR | Status: DC | PRN
  Administered 2017-08-11: 25 ug via INTRAVENOUS
  Filled 2017-08-08: qty 2

## 2017-08-08 MED ORDER — FREE WATER
300.0000 mL | Status: DC
  Administered 2017-08-08 – 2017-08-09 (×6): 300 mL

## 2017-08-08 MED ORDER — ACETAMINOPHEN 325 MG PO TABS
650.0000 mg | ORAL_TABLET | ORAL | Status: DC | PRN
  Administered 2017-08-15 – 2017-08-18 (×3): 650 mg
  Filled 2017-08-08 (×4): qty 2

## 2017-08-08 MED ORDER — CHLORHEXIDINE GLUCONATE CLOTH 2 % EX PADS: 6.0000 | MEDICATED_PAD | Freq: Every day | CUTANEOUS | Status: DC

## 2017-08-08 NOTE — Progress Notes (Signed)
Patient ID: Craig DiegoCharlie J Rowzee Jr., male   DOB: Jun 27, 1951, 66 y.o.   MRN: [id number]    Subjective: Pt did not require nephrostomy tube over the weekend as his hydronephrosis resolved on ultrasound on 12/1.  Objective: Vital signs in last 24 hours: Temp:  [98.4 F (36.9 C)-99 F (37.2 C)] 99 F (37.2 C) (12/03 1601) Pulse Rate:  [29-134] 72 (12/03 1927) Resp:  [21-40] 28 (12/03 1927) BP: (93-138)/(52-94) 94/56 (12/03 1927) SpO2:  [92 %-100 %] 96 % (12/03 1800) FiO2 (%):  [40 %] 40 % (12/03 1927) Weight:  [75.4 kg (166 lb 3.6 oz)] 75.4 kg (166 lb 3.6 oz) (12/03 0500)  Intake/Output from previous day: 12/02 0701 - 12/03 0700 In: 2348.5 [NG/GT:1852.5; IV Piggyback:256] Out: 1675 [Urine:1675] Intake/Output this shift: No intake/output data recorded.  Physical Exam:  Abd: No apparent CVAT.   Lab Results: Recent Labs    08/06/17 0533 08/07/17 0705 08/08/17 0404  HGB 7.4* 9.0* 8.9*  HCT 25.4* 30.2* 30.7*   CBC Latest Ref Rng & Units 08/08/2017 08/07/2017 08/06/2017  WBC 4.0 - 10.5 K/uL 12.7(H) 13.2(H) 11.2(H)  Hemoglobin 13.0 - 17.0 g/dL 4.6(N8.9(L) 6.2(X9.0(L) 7.4(L)  Hematocrit 39.0 - 52.0 % 30.7(L) 30.2(L) 25.4(L)  Platelets 150 - 400 K/uL 245 228 184     BMET Recent Labs    08/07/17 1246 08/08/17 0404  NA 148* 149*  K 4.1 3.7  CL 115* 116*  CO2 27 27  GLUCOSE 92 109*  BUN 31* 28*  CREATININE 0.97 0.96  CALCIUM 7.8* 7.9*     Studies/Results:   Assessment/Plan: 1) Bilateral hydronephrosis:Renal function has normalized and hydronephrosis resolved on recent ultrasound.  No indication for nephrostomy.  2) Hematuria:  This was present at time of acute infection and may be related.  The concern of hyperdense material in right renal collecting system is somewhat questionable. Although initial CT looked suspicious, findings on subsequent CT and U/S were not impressive.  After a detailed discussion with his daughter last week, they agreed they did not want to pursue aggressive  surgical interventions.  In light of his recent unimpressive imaging findings, will defer further evaluation for now.    3) Chronic urinary retention:  He should ideally have at least annual urologic follow up for neurogenic bladder/chronic retention.  I will tentatively sign off.  Please call me if there are further acute urologic concerns.   Pt's daughter, Craig Oneal, is closest relative. Phone number is [phone].     LOS: 7 days   Gurleen Larrivee,LES 08/08/2017, 7:45 PM

## 2017-08-08 NOTE — Progress Notes (Signed)
PULMONARY / CRITICAL CARE MEDICINE   Name: Craig DiegoCharlie J Lalanne Jr. MRN: [id number] DOB: [birth year]    ADMISSION DATE:  08/01/2017 CONSULTATION DATE:  08/01/2017  REFERRING MD:  Dr. 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 Blood cultures sent and started on cefepime 11/26 and amikacin 11/27.  Additionally, daughter reports patient normally requires ventilator at night normally but has required continously for the last two days.    [facility]; [facility]  [facility] with proteus bacteremia, septic and hemorrhagic shock.  Additionally has history of serratia and pseudomonas from tracheal aspirate in July, both sensitive to Cefepime, as well as enterobacter and morganella.  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    In ER, initial labs noted for K 6.5, BUN 213, sCr 4.74 (   ), WBC 17, Hgb 8.3, BNP 316, lactic acid 1.2 -> 0.82,  EKG with afib rate 132, CXR with bilateral infiltrates versus edema.  Normotensive in ER.  Treated with sepsis protocol with 2L NS bolus, vancomycin and cefepime given after blood cultures, and lopressor once for HR control.  Patient admitted for further evaluation.     SUBJECTIVE:  Pt weaning on ATC 40%.  Strong cough / clears secretions.   VITAL SIGNS: BP 131/69   Pulse 94   Temp 99 F (37.2 C) (Oral)   Resp (!) 35   Ht 5\' 11"  (1.803 m)   Wt 166 lb 3.6 oz (75.4 kg)   SpO2 100%   BMI 23.18 kg/m   HEMODYNAMICS:    VENTILATOR SETTINGS: Vent Mode: PRVC FiO2 (%):  [40 %] 40 % Set Rate:  [24 bmp]  24 bmp Vt Set:  [500 mL] 500 mL PEEP:  [5 cmH20] 5 cmH20 Plateau Pressure:  [18 cmH20-20 cmH20] 18 cmH20  INTAKE / OUTPUT: I/O last 3 completed shifts: In: 3368.5 [Other:240; NG/GT:2822.5; IV Piggyback:306] Out: 3125 [Urine:3125]  PHYSICAL EXAMINATION: General: chronically ill appearing male in NAD, sitting up in bed HEENT: MM pink/moist, trach midline c/d/i, creamy secretions  Neuro: Awake, alert, follows commands / nods / appears appropriate  CV: s1s2 rrr, no m/r/g PULM: even/non-labored, lungs bilaterally coarse  GI: soft, non-tender, bsx4 active, PEG c/d/i Extremities: warm/dry, no edema, R AKA Skin: no rashes or lesions  LABS:  BMET Recent Labs  Lab 08/07/17 0508 08/07/17 1246 08/08/17 0404  NA 149* 148* 149*  K 2.3* 4.1 3.7  CL 128* 115* 116*  CO2 17* 27 27  BUN 21* 31* 28*  CREATININE 0.64 0.97 0.96  GLUCOSE 71 92 109*   Electrolytes Recent Labs  Lab 08/04/17 0444 08/04/17 1606 08/05/17 0533 08/06/17 0533 08/07/17 0508 08/07/17 1246 08/08/17 0404  CALCIUM 8.6*  --  8.3* 8.0* 4.7* 7.8* 7.9*  MG 2.0 1.7 1.7 1.4*  --  1.8  --   PHOS 2.9 2.9 2.3*  --   --   --   --    CBC Recent Labs  Lab 08/06/17 0533 08/07/17 0705 08/08/17 0404  WBC 11.2* 13.2* 12.7*  HGB 7.4* 9.0* 8.9*  HCT 25.4* 30.2* 30.7*  PLT 184 228 245   Coag's Recent Labs  Lab 08/01/17 2146 08/07/17 0508  APTT 21*  --   INR 1.18 1.43   Sepsis Markers Recent Labs  Lab 08/01/17 1743 08/01/17 1939 08/02/17 1130  LATICACIDVEN 1.20 0.82  --   PROCALCITON  --   --  7.71   ABG Recent Labs  Lab 08/01/17 2144  PHART 7.386  PCO2ART 36.8  PO2ART 84.0   Liver Enzymes Recent Labs  Lab 08/01/17 1730 08/02/17 1130 08/03/17 1200  AST 55* 39  --   ALT 71* 43  --   ALKPHOS 229* 153*  --   BILITOT 0.6 0.5  --   ALBUMIN 2.3* 1.6* 1.6*   Cardiac Enzymes No results for input(s): TROPONINI, PROBNP in the last 168 hours.  Glucose Recent Labs  Lab 08/07/17 1205 08/07/17 1628  08/07/17 2015 08/07/17 2346 08/08/17 0416 08/08/17 0807  GLUCAP 97 81 83 93 98 109*   Imaging [place] Abdomen Limited Ruq  Result Date: 08/07/2017 CLINICAL DATA:  Pain. EXAM: ULTRASOUND ABDOMEN LIMITED RIGHT UPPER QUADRANT COMPARISON:  Renal ultrasound from yesterday FINDINGS: Gallbladder: Stones and sludge seen in the gallbladder. The gallbladder wall measures 4.2 mm. A Murphy's sign cannot be assessed as the patient is unresponsive. No pericholecystic fluid. Common bile duct: Diameter: 6 mm Liver: No focal lesion identified. Within normal limits in parenchymal echogenicity. Portal vein is patent on color Doppler imaging with normal direction of blood flow towards the liver. IMPRESSION: 1. A few small stones and some sludge are seen in the gallbladder with wall thickening. No pericholecystic fluid. A Murphy's sign cannot be assessed as the patient is unresponsive. A HIDA scan could further evaluate for cystic duct patency/acute cholecystitis as clinically warranted. 2. The common bile duct measures 6 mm which is borderline with no significant dilatation. Recommend correlation with labs. Electronically Signed   By: Gerome Samavid  Williams III M.D   On: 08/07/2017 19:34   STUDIES:  11/26 CXR >> bilateral infiltrates vs edema 11/27 CXR >> stable bilateral opacities TTE >> obtained 11/27 >> Mild increased left ventricular hypertrophy.  Systolic function was mildly reduced at 40-45% with global hypokinesis and inferior basal akinesis.  The right ventricle was mildly dilated but systolic function normal.  The right atrium was mildly dilated there is moderate tricuspid valve regurg.  There are no echocardiograms on file to compare CT Chest / Abd 11/27 >> Percutaneous gastrostomy tube retracted into the abdominal wall.  Radiologist reports what appears to be mature tract to the gastric lumen given oral contrast injected through catheter did not extravasate.  There is no pneumoperitoneum or fluid collection.  Advanced  bilateral hydronephrosis with upper hydroureter.  High density in the right renal collecting system consistent with hemorrhage.  Left greater than right lower lobe airspace disease.  CULTURES: 11/26 BCx 2 >> coag neg staph, candida glabrata  11/26 trach aspirate >> pseudomonas aeruginosa >> S- ceftaz, cipro, zosyn 11/26 UC >> 30k yeast 11/26: blood culture RID: + MRSA  ANTIBIOTICS: ? Prior Amikacin >>11/27 11/26 Cefepime (at [facility]) >> 11/27 11/26 Vancomycin >>11/27 Linezolid 11/27 >> 11/19 Ceftaz/avibactam 11/27 >> 12/1 Anidulofungin 11/30 >> 12/1  Ceftaz 12/1 >>  SIGNIFICANT EVENTS: 11/26 Admit  LINES/TUBES: R PICC from [facility] -  Gastrostomy tube >> Foley>> changed 11/26 >>  I reviewed CXR myself, trach is in good position.  DISCUSSION: [age] yoM presenting from [facility] with AMS, fever, and acute on chronic respiratory failure since 11/23 admitted for sepsis (pseudomonal PNA, fungemia, possible  osteo) and AKI.   ASSESSMENT / PLAN:  PULMONARY A: Acute on Chronic Respiratory Failure  Tracheostomy Status  Likely HCAP +/- pulmonary edema  P:   ATC as tolerated during day PRVC 8 cc as rest mode QHS  Trach care per protocol  Follow intermittent CXR  Mobilize as able / PT efforts   All issues below per [facility]  Afib (was on warfarin prior) Hx HFrEF- prior TTE 08/2016 45-50% AKI on CKD3- still making urine; has bilateral hydronephrosis, hematuria, and what appears to be right renal collecting system hemorrhage Chronic foley Hypernatremia Gtube-->malpositioned > replaced by IR Transaminitis - appears chronic based on May 2018 labs Anemia - 3 units PRBC Acute blood loss anemia presumably from area noted on CT scan from right renal collecting system noting concern for hemorrhage   Sepsis: Multiple sources: Pseudomonas PNA, Fungemia w/ candida glabrata, funguria & possible osteo 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   PCCM will continue to follow.  Will see again 12/5, please call sooner if new needs arise.   Canary Brim, NP-C [facility] Pgr: [phone] or if no answer [phone] 08/08/2017, 11:37 AM  Attending Note:  66 year old male with PMH above presenting to P[facility] from [facility] with AMS and fever.  Patient was admitted to the ICU for resuscitation.  Patient was resuscitated and improved.  On exam, lungs are clear, trach is in good position.  I reviewed CXR myself, trach is in good position.  Discussed with PCCM-NP.  Chronic respiratory failure:  - ATC during the day and vent at night  - Full vent at night  Hypoxemia:  - Titrate O2 for sat of 88-92%  Trach status:  - Maintain current trach size and type.  PCCM will continue to follow.  Patient seen and examined, agree with above note.  I dictated the care and orders written for this patient under my direction.  Alyson Reedy, MD [phone]

## 2017-08-08 NOTE — Progress Notes (Signed)
Tolerated 40% ATC well. No distress noted. Placed on vent HS for rest.

## 2017-08-08 NOTE — Progress Notes (Signed)
[place] TEAM 1 - Stepdown/ICU TEAM  Craig J Mira Jr.  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 Blood cultures sent and started on cefepime 11/26 and amikacin 11/27. Additionally, daughter reports patient normally requires ventilator at night but has required continously for the last two days.   Patient has been at [facility] since February 2018; prior to was at [facility]. Hospitalized at [facility] 01/2017 with proteus bacteremia, septic and hemorrhagic shock. Additionally has history of serratia and pseudomonas from tracheal aspirate in July, both sensitive to Cefepime, as well as enterobacter and morganella. 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   Significant Events: 11/26 transfer to [facility] from [facility]   Subjective: Resting comfortably in bed.  Not able to speak w/ trach.  Denies uncontrolled pain.  No evidence of respiratory distress.    Assessment & Plan:  Severe Sepsis multiple sources as below   Acute on chronic hypoxic respiratory failure w/ Pseudomonas aeruginosa PNA  Continue long-term antibiotic per ID (14 days)  Candida albicans Fungemia  continue antifungals per ID - ?PICC line related - currently on PICC holiday - consider replacing 12/4 - Opthal exam 11/30 w/o evidence of endophthalmitis   Hydronephrosis resolved on f/u US therefore did not require nephrostomy tube   H/oCRE per [facility]  Chronic systolic CHF No signif volume overload on exam  Filed Weights   08/06/17 0500 08/07/17 0500  08/08/17 0500  Weight: 79.8 kg (175 lb 14.8 oz) 76.6 kg (168 lb 14 oz) 75.4 kg (166 lb 3.6 oz)    Chronic Atrial Fibrillation warfarin being held due to acute renal hemorrhage  Acute on CKD stage III   Chronic Foley  RIGHT renal collecting system hemorrhage  Bilateral hydronephrosis per Urology - now resolved   Hypernatremia Increase free water and follow   Hypokalemia  Corrected   Hypomagnesemia Corrected   Cholecystitis? No clinical sx to suggest acute cholecystitis   Dysphagia NPO - PEG > malpositioned > replaced by IR  Transaminitis chronic based on May 2018 labs  Acute blood loss anemia  Due to R renal hemorrhage - s/p 3 U PRBC thus far   Hypothyroidism  Cont synthroid   Acute encephalopathy Appears to be improving - follow - minimize sedating meds as able   DVT prophylaxis: SQ heparin  Code Status: FULL CODE Family Communication: no family present at time of exam  Disposition Plan:   Consultants:  PCCM Urology  ID  Antimicrobials:  anidulafungin 11/29 > Ceftaz 11/27 >  Objective: Blood pressure 138/62, pulse 96, temperature 99 F (37.2 C), temperature source Oral, resp. rate (!) 30, height _0  (1.803 m), weight 75.4 kg (166 lb 3.6 oz), SpO2 100 %.  Intake/Output Summary (Last 24 hours) at 08/08/2017 0930 Last data filed at 08/08/2017 3818 Gross per 24 hour  Intake 2208.5 ml  Output 1675 ml  Net 533.5 ml   Filed Weights   08/06/17 0500 08/07/17 0500 08/08/17 0500  Weight: 79.8 kg (175 lb 14.8 oz) 76.6 kg (168 lb 14 oz) 75.4 kg (166 lb 3.6 oz)    Examination: General: No acute  respiratory distress Lungs: Clear to auscultation bilaterally without wheezes or crackles Cardiovascular: tachycardic - irreg - no M or rub  Abdomen: Nontender, nondistended, soft, bowel sounds positive, no rebound, no ascites, no appreciable mass Extremities: No significant cyanosis, clubbing, or edema   CBC: Recent Labs  Lab 08/01/17 1730   08/04/17 0444 08/05/17 0533 08/06/17 0533 08/07/17 0705 08/08/17 0404  WBC 17.0*   < > 11.3* 10.2 11.2* 13.2* 12.7*  NEUTROABS 14.2*  --   --   --   --  9.5* 9.3*  HGB 8.3*   < > 7.5* 7.2* 7.4* 9.0* 8.9*  HCT 26.9*   < > 25.8* 24.6* 25.4* 30.2* 30.7*  MCV 77.7*   < > 78.9 79.6 80.4 81.0 83.0  PLT 154   < > 159 181 184 228 245   < > = values in this interval not displayed.   Basic Metabolic Panel: Recent Labs  Lab 08/03/17 1450 08/03/17 1724 08/04/17 0444 08/04/17 1606 08/05/17 0533 08/06/17 0533 08/07/17 0508 08/07/17 1246 08/08/17 0404  NA  --   --  145  --  147* 148* 149* 148* 149*  K  --   --  3.4*  --  3.3* 3.3* 2.3* 4.1 3.7  CL  --   --  118*  --  118* 116* 128* 115* 116*  CO2  --   --  21*  --  23 25 17* 27 27  GLUCOSE  --   --  155*  --  107* 109* 71 92 109*  BUN  --   --  89*  --  63* 43* 21* 31* 28*  CREATININE  --   --  2.20*  --  1.66* 1.28* 0.64 0.97 0.96  CALCIUM  --   --  8.6*  --  8.3* 8.0* 4.7* 7.8* 7.9*  MG 1.9 1.9 2.0 1.7 1.7 1.4*  --  1.8  --   PHOS 3.6 3.7 2.9 2.9 2.3*  --   --   --   --    GFR: Estimated Creatinine Clearance: 82.8 mL/min (by C-G formula based on SCr of 0.96 mg/dL).  Liver Function Tests: Recent Labs  Lab 08/01/17 1730 08/02/17 1130 08/03/17 1200  AST 55* 39  --   ALT 71* 43  --   ALKPHOS 229* 153*  --   BILITOT 0.6 0.5  --   PROT 9.6* 7.2  --   ALBUMIN 2.3* 1.6* 1.6*    Coagulation Profile: Recent Labs  Lab 08/01/17 2146 08/07/17 0508  INR 1.18 1.43    Recent Results (from the past 240 hour(s))  Blood Culture (routine x 2)     Status: Abnormal   Collection Time: 08/01/17  5:22 PM  Result Value Ref Range Status   Specimen Description BLOOD LEFT HAND  Final   Special Requests   Final    Blood Culture adequate volume BOTTLES DRAWN AEROBIC AND ANAEROBIC   Culture  Setup Time   Final    GRAM POSITIVE COCCI IN CLUSTERS ANAEROBIC BOTTLE ONLY CRITICAL RESULT CALLED TO, READ BACK BY AND VERIFIED WITH: PHARMD G ABBOTT  [id number] MLM YEAST AEROBIC BOTTLE ONLY CRITICAL RESULT CALLED TO, READ BACK BY AND VERIFIED WITH: K WEIGLE GEXBMW [id number] 08/05/17 A BROWNING    Culture (A)  Final    STAPHYLOCOCCUS SPECIES (COAGULASE NEGATIVE) THE SIGNIFICANCE OF ISOLATING THIS ORGANISM FROM A SINGLE SET OF BLOOD CULTURES WHEN MULTIPLE SETS ARE DRAWN IS UNCERTAIN. PLEASE NOTIFY THE MICROBIOLOGY DEPARTMENT WITHIN ONE WEEK  IF SPECIATION AND SENSITIVITIES ARE REQUIRED. CANDIDA GLABRATA CORRECTED ON 12/01 AT 0840: PREVIOUSLY REPORTED AS YEAST    Report Status 08/07/2017 FINAL  Final  Blood Culture ID Panel (Reflexed)     Status: Abnormal   Collection Time: 08/01/17  5:22 PM  Result Value Ref Range Status   Enterococcus species NOT DETECTED NOT DETECTED Final   Listeria monocytogenes NOT DETECTED NOT DETECTED Final   Staphylococcus species DETECTED (A) NOT DETECTED Final    Comment: Methicillin (oxacillin) resistant coagulase negative staphylococcus. Possible blood culture contaminant (unless isolated from more than one blood culture draw or clinical case suggests pathogenicity). No antibiotic treatment is indicated for blood  culture contaminants. CRITICAL RESULT CALLED TO, READ BACK BY AND VERIFIED WITH: PHARMD G ABBOTT [phone] MLM    Staphylococcus aureus NOT DETECTED NOT DETECTED Final   Methicillin resistance DETECTED (A) NOT DETECTED Final    Comment: CRITICAL RESULT CALLED TO, READ BACK BY AND VERIFIED WITH: PHARMD G ABBOTT [phone] MLM    Streptococcus species NOT DETECTED NOT DETECTED Final   Streptococcus agalactiae NOT DETECTED NOT DETECTED Final   Streptococcus pneumoniae NOT DETECTED NOT DETECTED Final   Streptococcus pyogenes NOT DETECTED NOT DETECTED Final   Acinetobacter baumannii NOT DETECTED NOT DETECTED Final   Enterobacteriaceae species NOT DETECTED NOT DETECTED Final   Enterobacter cloacae complex NOT DETECTED NOT DETECTED Final   Escherichia coli NOT DETECTED NOT DETECTED Final   Klebsiella  oxytoca NOT DETECTED NOT DETECTED Final   Klebsiella pneumoniae NOT DETECTED NOT DETECTED Final   Proteus species NOT DETECTED NOT DETECTED Final   Serratia marcescens NOT DETECTED NOT DETECTED Final   Haemophilus influenzae NOT DETECTED NOT DETECTED Final   Neisseria meningitidis NOT DETECTED NOT DETECTED Final   Pseudomonas aeruginosa NOT DETECTED NOT DETECTED Final   Candida albicans NOT DETECTED NOT DETECTED Final   Candida glabrata NOT DETECTED NOT DETECTED Final   Candida krusei NOT DETECTED NOT DETECTED Final   Candida parapsilosis NOT DETECTED NOT DETECTED Final   Candida tropicalis NOT DETECTED NOT DETECTED Final  Blood Culture ID Panel (Reflexed)     Status: Abnormal   Collection Time: 08/01/17  5:22 PM  Result Value Ref Range Status   Enterococcus species NOT DETECTED NOT DETECTED Final   Listeria monocytogenes NOT DETECTED NOT DETECTED Final   Staphylococcus species NOT DETECTED NOT DETECTED Final   Staphylococcus aureus NOT DETECTED NOT DETECTED Final   Streptococcus species NOT DETECTED NOT DETECTED Final   Streptococcus agalactiae NOT DETECTED NOT DETECTED Final   Streptococcus pneumoniae NOT DETECTED NOT DETECTED Final   Streptococcus pyogenes NOT DETECTED NOT DETECTED Final   Acinetobacter baumannii NOT DETECTED NOT DETECTED Final   Enterobacteriaceae species NOT DETECTED NOT DETECTED Final   Enterobacter cloacae complex NOT DETECTED NOT DETECTED Final   Escherichia coli NOT DETECTED NOT DETECTED Final   Klebsiella oxytoca NOT DETECTED NOT DETECTED Final   Klebsiella pneumoniae NOT DETECTED NOT DETECTED Final   Proteus species NOT DETECTED NOT DETECTED Final   Serratia marcescens NOT DETECTED NOT DETECTED Final   Haemophilus influenzae NOT DETECTED NOT DETECTED Final   Neisseria meningitidis NOT DETECTED NOT DETECTED Final   Pseudomonas aeruginosa NOT DETECTED NOT DETECTED Final   Candida albicans NOT DETECTED NOT DETECTED Final   Candida glabrata DETECTED (A) NOT  DETECTED Final    Comment: CRITICAL RESULT CALLED TO, READ BACK BY AND VERIFIED WITH: K WEIGLE PHARMD 0330 08/05/17 A BROWNING    Candida krusei NOT DETECTED  NOT DETECTED Final   Candida parapsilosis NOT DETECTED NOT DETECTED Final   Candida tropicalis NOT DETECTED NOT DETECTED Final  Blood Culture (routine x 2)     Status: None   Collection Time: 08/01/17  7:05 PM  Result Value Ref Range Status   Specimen Description BLOOD RIGHT PICC LINE  Final   Special Requests IN PEDIATRIC BOTTLE Blood Culture adequate volume  Final   Culture NO GROWTH 5 DAYS  Final   Report Status 08/06/2017 FINAL  Final  Culture, respiratory (tracheal aspirate)     Status: None   Collection Time: 08/01/17 11:56 PM  Result Value Ref Range Status   Specimen Description TRACHEAL ASPIRATE  Final   Special Requests NONE  Final   Gram Stain   Final    ABUNDANT WBC PRESENT,BOTH PMN AND MONONUCLEAR MODERATE GRAM NEGATIVE RODS RARE GRAM POSITIVE RODS    Culture FEW PSEUDOMONAS AERUGINOSA  Final   Report Status 08/06/2017 FINAL  Final   Organism ID, Bacteria PSEUDOMONAS AERUGINOSA  Final      Susceptibility   Pseudomonas aeruginosa - MIC*    CEFTAZIDIME 4 SENSITIVE Sensitive     CIPROFLOXACIN <=0.25 SENSITIVE Sensitive     GENTAMICIN 8 INTERMEDIATE Intermediate     IMIPENEM >=16 RESISTANT Resistant     PIP/TAZO 16 SENSITIVE Sensitive     CEFEPIME 16 INTERMEDIATE Intermediate     * FEW PSEUDOMONAS AERUGINOSA  Urine culture     Status: Abnormal   Collection Time: 08/01/17 11:56 PM  Result Value Ref Range Status   Specimen Description URINE, CATHETERIZED  Final   Special Requests NONE  Final   Culture 30,000 COLONIES/mL YEAST (A)  Final   Report Status 08/03/2017 FINAL  Final  MRSA PCR Screening     Status: None   Collection Time: 08/02/17  1:05 AM  Result Value Ref Range Status   MRSA by PCR NEGATIVE NEGATIVE Final    Comment:        The GeneXpert MRSA Assay (FDA approved for NASAL specimens only), is one  component of a comprehensive MRSA colonization surveillance program. It is not intended to diagnose MRSA infection nor to guide or monitor treatment for MRSA infections.   Culture, blood (Routine X 2) w Reflex to ID Panel     Status: None   Collection Time: 08/03/17  3:16 PM  Result Value Ref Range Status   Specimen Description BLOOD LEFT HAND  Final   Special Requests IN PEDIATRIC BOTTLE Blood Culture adequate volume  Final   Culture NO GROWTH 5 DAYS  Final   Report Status 08/08/2017 FINAL  Final  Culture, blood (Routine X 2) w Reflex to ID Panel     Status: Abnormal   Collection Time: 08/03/17  3:28 PM  Result Value Ref Range Status   Specimen Description BLOOD LEFT HAND  Final   Special Requests IN PEDIATRIC BOTTLE Blood Culture adequate volume  Final   Culture  Setup Time   Final    YEAST IN PEDIATRIC BOTTLE CRITICAL VALUE NOTED.  VALUE IS CONSISTENT WITH PREVIOUSLY REPORTED AND CALLED VALUE.    Culture CANDIDA ALBICANS (A)  Final   Report Status 08/07/2017 FINAL  Final  Culture, blood (Routine X 2) w Reflex to ID Panel     Status: None (Preliminary result)   Collection Time: 08/06/17 11:40 AM  Result Value Ref Range Status   Specimen Description BLOOD LEFT ANTECUBITAL  Final   Special Requests   Final    BOTTLES  DRAWN AEROBIC AND ANAEROBIC Blood Culture results may not be optimal due to an excessive volume of blood received in culture bottles   Culture NO GROWTH 2 DAYS  Final   Report Status PENDING  Incomplete  Culture, blood (Routine X 2) w Reflex to ID Panel     Status: None (Preliminary result)   Collection Time: 08/06/17 11:41 AM  Result Value Ref Range Status   Specimen Description BLOOD RIGHT HAND  Final   Special Requests   Final    BOTTLES DRAWN AEROBIC AND ANAEROBIC Blood Culture adequate volume   Culture NO GROWTH 2 DAYS  Final   Report Status PENDING  Incomplete     Scheduled Meds: . chlorhexidine gluconate (MEDLINE KIT)  15 mL Mouth Rinse BID  .  diltiazem  30 mg Per Tube Q6H  . free water  250 mL Per Tube Q4H  . heparin injection (subcutaneous)  5,000 Units Subcutaneous Q8H  . ipratropium-albuterol  3 mL Nebulization Q6H  . levothyroxine  125 mcg Per Tube QAC breakfast  . mouth rinse  15 mL Mouth Rinse QID  . pantoprazole sodium  40 mg Per Tube Daily  . senna-docusate  1 tablet Per Tube QHS   Continuous Infusions: . anidulafungin Stopped (08/08/17 0750)  . cefTAZidime (FORTAZ)  IV Stopped (08/08/17 0127)  . feeding supplement (VITAL AF 1.2 CAL) 1,000 mL (08/07/17 2000)  . sodium chloride       LOS: 7 days   Cherene Altes, MD [facility] Office  [phone] Pager - Text Page per Amion as per below:  On-Call/Text Page:      [email or web address]      password [email or web address]  If 7PM-7AM, please contact night-coverage [email or web address] Password [email or web address] 08/08/2017, 9:30 AM

## 2017-08-08 NOTE — Progress Notes (Signed)
[place] for Infectious Disease  Date of Admission:  08/01/2017     Total days of antibiotics 7   Ceftazidime 12/1  Anidulafungin 11/29    Patient ID: Craig Oneal. is a 66 y.o. AA paraplegic male with chronic trach/ventilator dependence, PEG, foley from [facility]. Admitted with concern for HCAP, A/C CKD, anemia and fevers. 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   Active Problems:   Acute encephalopathy   Acute respiratory failure (HCC)   History of amputation of right leg through tibia and fibula ([place])   . chlorhexidine gluconate (MEDLINE KIT)  15 mL Mouth Rinse BID  . diltiazem  30 mg Per Tube Q6H  . free water  250 mL Per Tube Q4H  . heparin injection (subcutaneous)  5,000 Units Subcutaneous Q8H  . ipratropium-albuterol  3 mL Nebulization Q6H  . levothyroxine  125 mcg Per Tube QAC breakfast  . mouth rinse  15 mL Mouth Rinse QID  . pantoprazole sodium  40 mg Per Tube Daily  . senna-docusate  1 tablet Per Tube QHS    SUBJECTIVE: Opens eyes and attempts to communicate with facial expression. Denies pain.   Review of Systems: Review of Systems  Constitutional: Negative for chills and fever.  Eyes: Negative for blurred vision.  Respiratory: Negative for cough and sputum production.   Cardiovascular: Negative for chest pain.  Gastrointestinal: Negative for diarrhea.  Musculoskeletal: Negative for myalgias.  Skin: Negative for rash.  Neurological: Negative for headaches.    No Known Allergies  OBJECTIVE: Vitals:   08/08/17 0700 08/08/17 0804 08/08/17 0829 08/08/17 0858  BP: 138/62     Pulse: 96     Resp: (!) 30     Temp:  99 F (37.2 C)    TempSrc:  Oral    SpO2: 100%  100% 100%  Weight:      Height:       Body mass index is 23.18 kg/m.  Physical Exam  Constitutional: He is oriented to person, place, and time and well-developed, well-nourished, and in no distress.  Sitting up in bed. Eyes  open spontaneous. Able to communicate well and answers all questions.   HENT:  Mouth/Throat: Oropharynx is clear and moist. No oral lesions. Normal dentition. No dental caries.  Eyes: Pupils are equal, round, and reactive to light. No scleral icterus.  Cardiovascular: Normal heart sounds. An irregularly irregular rhythm present. Tachycardia present.  Pulmonary/Chest: Effort normal and breath sounds normal. No tachypnea.  Abdominal: Soft. Bowel sounds are normal. He exhibits no distension. There is no tenderness.  PEG tube in place. Clean and dry.   Genitourinary:  Genitourinary Comments: Urine clear yellow. Foley in place.   Musculoskeletal: Normal range of motion.  Lymphadenopathy:    He has no cervical adenopathy.  Neurological: He is alert and oriented to person, place, and time.  Skin: Skin is warm and dry. No rash noted.  Psychiatric: Mood and affect normal.   Lab Results Lab Results  Component Value Date   WBC 12.7 (H) 08/08/2017   HGB 8.9 (L) 08/08/2017   HCT 30.7 (L) 08/08/2017   MCV 83.0 08/08/2017   PLT 245 08/08/2017    Lab Results  Component Value Date   CREATININE 0.96 08/08/2017   BUN 28 (H) 08/08/2017   NA 149 (H) 08/08/2017   K 3.7 08/08/2017   CL 116 (H) 08/08/2017   CO2 27 08/08/2017  Lab Results  Component Value Date   ALT 43 08/02/2017   AST 39 08/02/2017   ALKPHOS 153 (H) 08/02/2017   BILITOT 0.5 08/02/2017     Microbiology: BCx 11/26 >> 1/2 sets + CoNS species; Candida Glabrata  BCx 11/28 >> 1/2 sets + C. Albicans  BCx 12/01 >> no growth  Trach Asp Cx >> PsA (S- ceftaz, cipro, pip-tazo; R- imipenem; I- gent, cefepime) Urine Cx 11/26 >> 30k yeast   Assessment & Plan:  1. Acute on Chronic Respiratory Failure, HCAP = CT L > R LL pneumonia. Trach aspirate with few Pseudomonas. Back to baseline status with respiratory status. Minimal to no secretions.   WBC stable at 12.7. Temps < 100.   Would treat 10 days (day 7/10 today) with ceftazidime  for PsA HCAP   2. Encephalopathy = opens eyes to commands and appears to be at baseline regarding communication efforts.   3. Candidemia = sepsis syndrome resolved. Anidulafungin for C. Glabrata, repeat BCx with C. Albicans. TTE no evidence of IE --> would not at this point pursue TEE d/t aspiration risk. Opth. has seen and no endophthalmitis.   MRSE from 11/26 likely contaminant  BCx 12/1 >> NG x2d  Would continue to hold on PICC replacement if possible for now as previous cultures turned (+) on day 3 of growth.   Will need 2 weeks of IV Anidulafungin from negative culture date.   4. Acute on Chronic CKD, Hematuria = creatine normalized. Clear yellow urine   No need for nephrostomy tubes per 12/2 notes.  Janene Madeira, MSN, NP-C [facility] [place] Cell: [phone] Pager: [phone]  08/08/2017  9:12 AM

## 2017-08-08 NOTE — Progress Notes (Signed)
Patient seen for trach team follow up.  All needed equipment at the bedside.  No education needed at this time.  Nothing needed at this time.  Trach team not needed at this time.

## 2017-08-09 ENCOUNTER — Other Ambulatory Visit: Payer: Self-pay

## 2017-08-09 DIAGNOSIS — Z9911 Dependence on respirator [ventilator] status: Secondary | ICD-10-CM

## 2017-08-09 LAB — GLUCOSE, CAPILLARY
GLUCOSE-CAPILLARY: 122 mg/dL — AB (ref 65–99)
Glucose-Capillary: 102 mg/dL — ABNORMAL HIGH (ref 65–99)
Glucose-Capillary: 106 mg/dL — ABNORMAL HIGH (ref 65–99)
Glucose-Capillary: 108 mg/dL — ABNORMAL HIGH (ref 65–99)
Glucose-Capillary: 122 mg/dL — ABNORMAL HIGH (ref 65–99)
Glucose-Capillary: 93 mg/dL (ref 65–99)

## 2017-08-09 MED ORDER — SIMETHICONE 40 MG/0.6ML PO SUSP
40.0000 mg | Freq: Four times a day (QID) | ORAL | Status: DC
  Administered 2017-08-09 – 2017-08-20 (×43): 40 mg
  Filled 2017-08-09 (×46): qty 0.6

## 2017-08-09 MED ORDER — FREE WATER
350.0000 mL | Status: DC
  Administered 2017-08-09 – 2017-08-12 (×20): 350 mL

## 2017-08-09 MED ORDER — WARFARIN SODIUM 5 MG PO TABS
5.0000 mg | ORAL_TABLET | Freq: Once | ORAL | Status: AC
  Administered 2017-08-09: 5 mg via ORAL
  Filled 2017-08-09: qty 1

## 2017-08-09 MED ORDER — WARFARIN - PHARMACIST DOSING INPATIENT
Freq: Every day | Status: DC
  Administered 2017-08-09 – 2017-08-10 (×2)
  Administered 2017-08-12: 1
  Administered 2017-08-14 – 2017-08-16 (×3)

## 2017-08-09 MED ORDER — POTASSIUM CL IN DEXTROSE 5% 20 MEQ/L IV SOLN
20.0000 meq | INTRAVENOUS | Status: DC
Start: 2017-08-09 — End: 2017-08-11
  Administered 2017-08-09 – 2017-08-11 (×4): 20 meq via INTRAVENOUS
  Filled 2017-08-09 (×6): qty 1000

## 2017-08-09 NOTE — Progress Notes (Signed)
Placed patient back on full support for the night as per order. Patient does not appear to be in any respiratory distress while on trach collar.

## 2017-08-09 NOTE — Progress Notes (Signed)
PULMONARY / CRITICAL CARE MEDICINE   Name: Craig Oneal Jr. MRN: [id number] DOB: December 06, 1950    ADMISSION DATE:  08/01/2017 CONSULTATION DATE:  08/01/2017  REFERRING MD:  Dr. 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 Blood cultures sent and started on cefepime 11/26 and amikacin 11/27.  Additionally, daughter reports patient normally requires ventilator at night normally but has required continously for the last two days.    [facility]; [facility]  [facility] with proteus bacteremia, septic and hemorrhagic shock.  Additionally has history of serratia and pseudomonas from tracheal aspirate in July, both sensitive to Cefepime, as well as enterobacter and morganella.  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    In ER, initial labs noted for K 6.5, BUN 213, sCr 4.74 (   ), WBC 17, Hgb 8.3, BNP 316, lactic acid 1.2 -> 0.82,  EKG with afib rate 132, CXR with bilateral infiltrates versus edema.  Normotensive in ER.  Treated with sepsis protocol with 2L NS bolus, vancomycin and cefepime given after blood cultures, and lopressor once for HR control.  Patient admitted for further evaluation.     SUBJECTIVE:  No events overnight  VITAL SIGNS: BP (!) 100/57   Pulse 99   Temp 99.1 F (37.3 C) (Oral)   Resp (!) 31   Ht 5\' 11"  (1.803 m)   Wt 77.2 kg (170 lb 3.1 oz)   SpO2 99%   BMI 23.74 kg/m   HEMODYNAMICS:    VENTILATOR SETTINGS: Vent Mode: PRVC FiO2 (%):  [30 %-40 %] 35 % Set Rate:  [16 bmp] 16 bmp Vt Set:  [500 mL]  500 mL PEEP:  [5 cmH20] 5 cmH20 Plateau Pressure:  [18 cmH20-21 cmH20] 19 cmH20  INTAKE / OUTPUT: I/O last 3 completed shifts: In: 5741.7 [I.V.:981.7; NG/GT:4170; IV Piggyback:590] Out: 2610 [Urine:2610]  PHYSICAL EXAMINATION: General: Chronically ill appearing male, NAD HEENT: RRR, Nl S1/S2, -M/R/G Neuro: Awake and interactive, moving ext to command CV: RRR, NL S1/S2, -M/R/G. PULM: CTA bilaterally GI: Soft, NT, ND and +BS Extremities: warm/dry, no edema, R AKA Skin: no rashes or lesions  LABS:  BMET Recent Labs  Lab 08/07/17 0508 08/07/17 1246 08/08/17 0404  NA 149* 148* 149*  K 2.3* 4.1 3.7  CL 128* 115* 116*  CO2 17* 27 27  BUN 21* 31* 28*  CREATININE 0.64 0.97 0.96  GLUCOSE 71 92 109*   Electrolytes Recent Labs  Lab 08/04/17 0444 08/04/17 1606 08/05/17 0533 08/06/17 0533 08/07/17 0508 08/07/17 1246 08/08/17 0404  CALCIUM 8.6*  --  8.3* 8.0* 4.7* 7.8* 7.9*  MG 2.0 1.7 1.7 1.4*  --  1.8  --   PHOS 2.9 2.9 2.3*  --   --   --   --    CBC Recent Labs  Lab 08/06/17 0533 08/07/17 0705 08/08/17 0404  WBC 11.2* 13.2* 12.7*  HGB 7.4* 9.0* 8.9*  HCT 25.4* 30.2* 30.7*  PLT 184 228 245   Coag's Recent Labs  Lab 08/07/17 0508  INR 1.43   Sepsis Markers No results  for input(s): LATICACIDVEN, PROCALCITON, O2SATVEN in the last 168 hours. ABG No results for input(s): PHART, PCO2ART, PO2ART in the last 168 hours. Liver Enzymes Recent Labs  Lab 08/03/17 1200  ALBUMIN 1.6*   Cardiac Enzymes No results for input(s): TROPONINI, PROBNP in the last 168 hours.  Glucose Recent Labs  Lab 08/08/17 1554 08/08/17 2037 08/09/17 0041 08/09/17 0531 08/09/17 0820 08/09/17 1150  GLUCAP 109* 117* 102* 93 122* 108*   Imaging No results found. STUDIES:  11/26 CXR >> bilateral infiltrates vs edema 11/27 CXR >> stable bilateral opacities TTE >> obtained 11/27 >> Mild increased left ventricular hypertrophy.  Systolic function was mildly reduced at 40-45% with  global hypokinesis and inferior basal akinesis.  The right ventricle was mildly dilated but systolic function normal.  The right atrium was mildly dilated there is moderate tricuspid valve regurg.  There are no echocardiograms on file to compare CT Chest / Abd 11/27 >> Percutaneous gastrostomy tube retracted into the abdominal wall.  Radiologist reports what appears to be mature tract to the gastric lumen given oral contrast injected through catheter did not extravasate.  There is no pneumoperitoneum or fluid collection.  Advanced bilateral hydronephrosis with upper hydroureter.  High density in the right renal collecting system consistent with hemorrhage.  Left greater than right lower lobe airspace disease.  CULTURES: 11/26 BCx 2 >> coag neg staph, candida glabrata  11/26 trach aspirate >> pseudomonas aeruginosa >> S- ceftaz, cipro, zosyn 11/26 UC >> 30k yeast 11/26: blood culture RID: + MRSA  ANTIBIOTICS: ? Prior Amikacin >>11/27 11/26 Cefepime (at [facility]) >> 11/27 11/26 Vancomycin >>11/27 Linezolid 11/27 >> 11/19 Ceftaz/avibactam 11/27 >> 12/1 Anidulofungin 11/30 >> 12/1  Ceftaz 12/1 >>  SIGNIFICANT EVENTS: 11/26 Admit  LINES/TUBES: R PICC from [facility] -  Gastrostomy tube >> Foley>> changed 11/26 >>  I reviewed CXR myself, trach is in good position.  DISCUSSION:  66 year old male with PMH above presenting to [facility] with AMS and fever.  Patient was admitted to the ICU for resuscitation.  Patient was resuscitated and improved.  On exam, lungs are clear, trach is in good position.  I reviewed CXR myself, trach is in good position.  Discussed with PCCM-NP.  Chronic respiratory failure:  - ATC during the day  - Full vent support at night  Hypoxemia:  - Titrate O2 for sat of 88-92%  Trach status:  - Maintain current trach size and type  PCCM will continue to follow, may transfer to SDU vent bed.  Patient seen and examined, agree with above note.  I dictated the  care and orders written for this patient under my direction.  Alyson ReedyYacoub, Wesam G, MD [phone]

## 2017-08-09 NOTE — Clinical Social Work Note (Signed)
Clinical Social Work Assessment  Patient Details  Name: Craig DiegoCharlie J Gul Jr. MRN: [id number] Date of Birth: 12-Feb-1951  Date of referral:  08/09/17               Reason for consult:  Facility Placement( p tis from [facility]. )                Permission sought to share information with:  Family Supports Permission granted to share information::  Yes, Verbal Permission Granted  Name::     Craig Oneal   Agency::     Relationship::   Daughter   Contact Information:     Housing/Transportation Living arrangements for the past 2 months:  Skilled Nursing Facility([facility]. ) Source of Information:  Adult Children(daughter Craig. ) Patient Interpreter Needed:  None Criminal Activity/Legal Involvement Pertinent to Current Situation/Hospitalization:  No - Comment as needed Significant Relationships:  Adult Children Lives with:  Facility Resident Do you feel safe going back to the place where you live?  Yes Need for family participation in patient care:  Yes (Comment)  Care giving concerns:  CSW spoke with pt's daughter Sunny SchleinFelicia via phone. At this time pt's daughter did not express any further concerns to CSW.    Social Worker assessment / plan:  CSW spoke with pt's daughter at bedside. During this time CSW was informed that pt is from [facility] where pt is a long term resident. Pt has support from daughter and other family members as well. CSW reached out to [facility] and [facility] and was informed that they did not have beds as of today but one could become open this week.   Employment status:  Disabled (Comment on whether or not currently receiving Disability) Insurance information:  Medicare PT Recommendations:  Not assessed at this time Information / Referral to community resources:  Skilled Nursing Facility  Patient/Family's Response to care:  At this time pt's daughter is agreeable and understanding of plan of care.   Patient/Family's Understanding of and Emotional Response to  Diagnosis, Current Treatment, and Prognosis:  No further questions or concerns have been presented to CSW at this time.   Emotional Assessment Appearance:  Appears stated age Attitude/Demeanor/Rapport:    Affect (typically observed):    Orientation:  (pt is intubated but follows commands. ) Alcohol / Substance use:  Not Applicable Psych involvement (Current and /or in the community):     Discharge Needs  Concerns to be addressed:  No discharge needs identified Readmission within the last 30 days:  No Current discharge risk:  None Barriers to Discharge:  No SNF bed(at [facility] as of today.)   Robb MatarKierra S Hartman Minahan, LCSWA 08/09/2017, 10:56 AM

## 2017-08-09 NOTE — Progress Notes (Signed)
Craig TEAM 1 - Stepdown/ICU TEAM  Craig J Rochel Jr.  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 Blood cultures sent and started on cefepime 11/26 and amikacin 11/27. Additionally, daughter reported patient normally requires ventilator at night but had required continously for the preceding two days.   Patient has been at [facility] since February 2018; prior to was at [facility].  Hospitalized at [facility] 01/2017 with proteus bacteremia, sepsis, and hemorrhagic shock.  Additionally has history of Serratia and Pseudomonas from tracheal aspirate in July, both sensitive to Cefepime, as well as Enterobacter and Morganella. 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   Significant Events: 11/26 transfer to [facility] from [facility]  11/28 PEG tube replaced and upsized per IR   Subjective: Smiling ear to ear.  In good spirits.  Denies any complaints, but his abdom does appear a bit distended and is tympanic.    Assessment & Plan:  Severe Sepsis multiple sources as below - sepsis physiology has resolved   Acute on chronic hypoxic respiratory failure w/ Pseudomonas aeruginosa PNA  Continue long-term antibiotic per ID (14 days) - clinically improving - vent wean and trach care per PCCM   Candida albicans Fungemia  continue antifungals per ID - ?PICC line related - currently on PICC holiday - Opthal exam 11/30 w/o evidence of endophthalmitis   B Hdyro > R Hydronephrosis > resolved  resolved on f/u US therefore did not require nephrostomy tube - Urology has evaluated  - no further testing planned at this time   R renal collecting system hemorrhage Noted as a possible explanation for R hydro - hydro resolved - no gross hematuria   H/oCRE per [facility]  Chronic systolic CHF TTE 58/59/29 noted EF 40-45% - no signif volume overload on exam - net +8.8L since admit  Filed Weights   08/07/17 0500 08/08/17 0500 08/09/17 0333  Weight: 76.6 kg (168 lb 14 oz) 75.4 kg (166 lb 3.6 oz) 77.2 kg (170 lb 3.1 oz)    Chronic Atrial Fibrillation Remains in Afib w/ rate controlled - warfarin was being held due to possible acute renal hemorrhage - resume warfarin w/o overlap and follow   Acute on CKD stage III   Recent Labs  Lab 08/05/17 0533 08/06/17 0533 08/07/17 0508 08/07/17 1246 08/08/17 0404  CREATININE 1.66* 1.28* 0.64 0.97 0.96    Chronic Foley - Neurogenic bladder Care as per Urology recs   Hypernatremia cont free water and follow trend    Cholecystitis? No clinical sx to suggest acute cholecystitis   Dysphagia NPO - PEG > malpositioned > replaced by IR  Transaminitis chronic based on May 2018 labs - recheck in AM   Acute blood loss anemia  ?due to R renal hemorrhage but findings not entirely convincing - s/p 3 U PRBC - Hgb holding steady/climbing - follow w/ resumption of warfarin   Hypothyroidism  Cont synthroid   Acute encephalopathy Due to acute illness - resolved    DVT prophylaxis: SQ heparin + warfarin  Code Status: FULL CODE Family Communication: no family present at time of exam  Disposition Plan: SDU  Consultants:  PCCM Urology  ID  Antimicrobials:  Anidulafungin 11/29 > Ceftaz 11/27 >  Objective: Blood pressure (!) 102/47, pulse 80, temperature 99.2 F (37.3 C), temperature source Oral, resp. rate 18, height _0  (1.803 m), weight 77.2 kg (170 lb 3.1 oz), SpO2 100 %.  Intake/Output Summary (Last 24 hours) at 08/09/2017 0850 Last data filed at 08/09/2017 0800 Gross per 24 hour  Intake 4151.67 ml    Output 1610 ml  Net 2541.67 ml   Filed Weights   08/07/17 0500 08/08/17 0500 08/09/17 0333  Weight: 76.6 kg (168 lb 14 oz) 75.4 kg (166 lb 3.6 oz) 77.2 kg (170 lb 3.1 oz)    Examination: General: No acute respiratory distress - alert and pleasant  Lungs: Clear to auscultation B - no wheezing  Cardiovascular: regular rate - irreg - no M or rub  Abdomen: protuberant, soft, BS+, no rebound, non-tender  Extremities: No significant cyanosis, clubbing, or edema - staples removed from surgical site    CBC: Recent Labs  Lab 08/04/17 0444 08/05/17 0533 08/06/17 0533 08/07/17 0705 08/08/17 0404  WBC 11.3* 10.2 11.2* 13.2* 12.7*  NEUTROABS  --   --   --  9.5* 9.3*  HGB 7.5* 7.2* 7.4* 9.0* 8.9*  HCT 25.8* 24.6* 25.4* 30.2* 30.7*  MCV 78.9 79.6 80.4 81.0 83.0  PLT 159 181 184 228 244   Basic Metabolic Panel: Recent Labs  Lab 08/03/17 1450 08/03/17 1724 08/04/17 0444 08/04/17 1606 08/05/17 0533 08/06/17 0533 08/07/17 0508 08/07/17 1246 08/08/17 0404  NA  --   --  145  --  147* 148* 149* 148* 149*  K  --   --  3.4*  --  3.3* 3.3* 2.3* 4.1 3.7  CL  --   --  118*  --  118* 116* 128* 115* 116*  CO2  --   --  21*  --  23 25 17* 27 27  GLUCOSE  --   --  155*  --  107* 109* 71 92 109*  BUN  --   --  89*  --  63* 43* 21* 31* 28*  CREATININE  --   --  2.20*  --  1.66* 1.28* 0.64 0.97 0.96  CALCIUM  --   --  8.6*  --  8.3* 8.0* 4.7* 7.8* 7.9*  MG 1.9 1.9 2.0 1.7 1.7 1.4*  --  1.8  --   PHOS 3.6 3.7 2.9 2.9 2.3*  --   --   --   --    GFR: Estimated Creatinine Clearance: 82.8 mL/min (by C-G formula based on SCr of 0.96 mg/dL).  Liver Function Tests: Recent Labs  Lab 08/02/17 1130 08/03/17 1200  AST 39  --   ALT 43  --   ALKPHOS 153*  --   BILITOT 0.5  --   PROT 7.2  --   ALBUMIN 1.6* 1.6*    Coagulation Profile: Recent Labs  Lab 08/07/17 0508  INR 1.43    Recent Results (from the past 240 hour(s))  Blood Culture (routine x 2)     Status: Abnormal   Collection  Time: 08/01/17  5:22 PM  Result Value Ref Range Status   Specimen Description BLOOD LEFT HAND  Final   Special Requests   Final    Blood Culture adequate volume BOTTLES DRAWN AEROBIC AND ANAEROBIC   Culture  Setup Time   Final    GRAM POSITIVE COCCI IN CLUSTERS ANAEROBIC BOTTLE ONLY CRITICAL RESULT CALLED TO, READ BACK  BY AND VERIFIED WITH: PHARMD G ABBOTT [id number] MLM YEAST AEROBIC BOTTLE ONLY CRITICAL RESULT CALLED TO, READ BACK BY AND VERIFIED WITH: K WEIGLE PHARMD 0330 08/05/17 A BROWNING    Culture (A)  Final    STAPHYLOCOCCUS SPECIES (COAGULASE NEGATIVE) THE SIGNIFICANCE OF ISOLATING THIS ORGANISM FROM A SINGLE SET OF BLOOD CULTURES WHEN MULTIPLE SETS ARE DRAWN IS UNCERTAIN. PLEASE NOTIFY THE MICROBIOLOGY DEPARTMENT WITHIN ONE WEEK IF SPECIATION AND SENSITIVITIES ARE REQUIRED. CANDIDA GLABRATA CORRECTED ON 12/01 AT 0840: PREVIOUSLY REPORTED AS YEAST    Report Status 08/07/2017 FINAL  Final  Blood Culture ID Panel (Reflexed)     Status: Abnormal   Collection Time: 08/01/17  5:22 PM  Result Value Ref Range Status   Enterococcus species NOT DETECTED NOT DETECTED Final   Listeria monocytogenes NOT DETECTED NOT DETECTED Final   Staphylococcus species DETECTED (A) NOT DETECTED Final    Comment: Methicillin (oxacillin) resistant coagulase negative staphylococcus. Possible blood culture contaminant (unless isolated from more than one blood culture draw or clinical case suggests pathogenicity). No antibiotic treatment is indicated for blood  culture contaminants. CRITICAL RESULT CALLED TO, READ BACK BY AND VERIFIED WITH: PHARMD G ABBOTT [id number] MLM    Staphylococcus aureus NOT DETECTED NOT DETECTED Final   Methicillin resistance DETECTED (A) NOT DETECTED Final    Comment: CRITICAL RESULT CALLED TO, READ BACK BY AND VERIFIED WITH: PHARMD G ABBOTT [id number] 0726 MLM    Streptococcus species NOT DETECTED NOT DETECTED Final   Streptococcus agalactiae NOT DETECTED NOT DETECTED Final     Streptococcus pneumoniae NOT DETECTED NOT DETECTED Final   Streptococcus pyogenes NOT DETECTED NOT DETECTED Final   Acinetobacter baumannii NOT DETECTED NOT DETECTED Final   Enterobacteriaceae species NOT DETECTED NOT DETECTED Final   Enterobacter cloacae complex NOT DETECTED NOT DETECTED Final   Escherichia coli NOT DETECTED NOT DETECTED Final   Klebsiella oxytoca NOT DETECTED NOT DETECTED Final   Klebsiella pneumoniae NOT DETECTED NOT DETECTED Final   Proteus species NOT DETECTED NOT DETECTED Final   Serratia marcescens NOT DETECTED NOT DETECTED Final   Haemophilus influenzae NOT DETECTED NOT DETECTED Final   Neisseria meningitidis NOT DETECTED NOT DETECTED Final   Pseudomonas aeruginosa NOT DETECTED NOT DETECTED Final   Candida albicans NOT DETECTED NOT DETECTED Final   Candida glabrata NOT DETECTED NOT DETECTED Final   Candida krusei NOT DETECTED NOT DETECTED Final   Candida parapsilosis NOT DETECTED NOT DETECTED Final   Candida tropicalis NOT DETECTED NOT DETECTED Final  Blood Culture ID Panel (Reflexed)     Status: Abnormal   Collection Time: 08/01/17  5:22 PM  Result Value Ref Range Status   Enterococcus species NOT DETECTED NOT DETECTED Final   Listeria monocytogenes NOT DETECTED NOT DETECTED Final   Staphylococcus species NOT DETECTED NOT DETECTED Final   Staphylococcus aureus NOT DETECTED NOT DETECTED Final   Streptococcus species NOT DETECTED NOT DETECTED Final   Streptococcus agalactiae NOT DETECTED NOT DETECTED Final   Streptococcus pneumoniae NOT DETECTED NOT DETECTED Final   Streptococcus pyogenes NOT DETECTED NOT DETECTED Final   Acinetobacter baumannii NOT DETECTED NOT DETECTED Final   Enterobacteriaceae species NOT DETECTED NOT DETECTED Final   Enterobacter cloacae complex NOT DETECTED NOT DETECTED Final   Escherichia coli NOT DETECTED NOT DETECTED Final   Klebsiella oxytoca NOT DETECTED NOT DETECTED Final   Klebsiella pneumoniae NOT DETECTED NOT DETECTED Final    Proteus species NOT DETECTED NOT DETECTED Final   Serratia marcescens NOT DETECTED NOT DETECTED  Final   Haemophilus influenzae NOT DETECTED NOT DETECTED Final   Neisseria meningitidis NOT DETECTED NOT DETECTED Final   Pseudomonas aeruginosa NOT DETECTED NOT DETECTED Final   Candida albicans NOT DETECTED NOT DETECTED Final   Candida glabrata DETECTED (A) NOT DETECTED Final    Comment: CRITICAL RESULT CALLED TO, READ BACK BY AND VERIFIED WITH: K WEIGLE PHARMD 0330 08/05/17 A BROWNING    Candida krusei NOT DETECTED NOT DETECTED Final   Candida parapsilosis NOT DETECTED NOT DETECTED Final   Candida tropicalis NOT DETECTED NOT DETECTED Final  Blood Culture (routine x 2)     Status: None   Collection Time: 08/01/17  7:05 PM  Result Value Ref Range Status   Specimen Description BLOOD RIGHT PICC LINE  Final   Special Requests IN PEDIATRIC BOTTLE Blood Culture adequate volume  Final   Culture NO GROWTH 5 DAYS  Final   Report Status 08/06/2017 FINAL  Final  Culture, respiratory (tracheal aspirate)     Status: None   Collection Time: 08/01/17 11:56 PM  Result Value Ref Range Status   Specimen Description TRACHEAL ASPIRATE  Final   Special Requests NONE  Final   Gram Stain   Final    ABUNDANT WBC PRESENT,BOTH PMN AND MONONUCLEAR MODERATE GRAM NEGATIVE RODS RARE GRAM POSITIVE RODS    Culture FEW PSEUDOMONAS AERUGINOSA  Final   Report Status 08/06/2017 FINAL  Final   Organism ID, Bacteria PSEUDOMONAS AERUGINOSA  Final      Susceptibility   Pseudomonas aeruginosa - MIC*    CEFTAZIDIME 4 SENSITIVE Sensitive     CIPROFLOXACIN <=0.25 SENSITIVE Sensitive     GENTAMICIN 8 INTERMEDIATE Intermediate     IMIPENEM >=16 RESISTANT Resistant     PIP/TAZO 16 SENSITIVE Sensitive     CEFEPIME 16 INTERMEDIATE Intermediate     * FEW PSEUDOMONAS AERUGINOSA  Urine culture     Status: Abnormal   Collection Time: 08/01/17 11:56 PM  Result Value Ref Range Status   Specimen Description URINE, CATHETERIZED   Final   Special Requests NONE  Final   Culture 30,000 COLONIES/mL YEAST (A)  Final   Report Status 08/03/2017 FINAL  Final  MRSA PCR Screening     Status: None   Collection Time: 08/02/17  1:05 AM  Result Value Ref Range Status   MRSA by PCR NEGATIVE NEGATIVE Final    Comment:        The GeneXpert MRSA Assay (FDA approved for NASAL specimens only), is one component of a comprehensive MRSA colonization surveillance program. It is not intended to diagnose MRSA infection nor to guide or monitor treatment for MRSA infections.   Culture, blood (Routine X 2) w Reflex to ID Panel     Status: None   Collection Time: 08/03/17  3:16 PM  Result Value Ref Range Status   Specimen Description BLOOD LEFT HAND  Final   Special Requests IN PEDIATRIC BOTTLE Blood Culture adequate volume  Final   Culture NO GROWTH 5 DAYS  Final   Report Status 08/08/2017 FINAL  Final  Culture, blood (Routine X 2) w Reflex to ID Panel     Status: Abnormal   Collection Time: 08/03/17  3:28 PM  Result Value Ref Range Status   Specimen Description BLOOD LEFT HAND  Final   Special Requests IN PEDIATRIC BOTTLE Blood Culture adequate volume  Final   Culture  Setup Time   Final    YEAST IN PEDIATRIC BOTTLE CRITICAL VALUE NOTED.  VALUE IS CONSISTENT WITH  PREVIOUSLY REPORTED AND CALLED VALUE.    Culture CANDIDA ALBICANS (A)  Final   Report Status 08/07/2017 FINAL  Final  Culture, blood (Routine X 2) w Reflex to ID Panel     Status: None (Preliminary result)   Collection Time: 08/06/17 11:40 AM  Result Value Ref Range Status   Specimen Description BLOOD LEFT ANTECUBITAL  Final   Special Requests   Final    BOTTLES DRAWN AEROBIC AND ANAEROBIC Blood Culture results may not be optimal due to an excessive volume of blood received in culture bottles   Culture NO GROWTH 2 DAYS  Final   Report Status PENDING  Incomplete  Culture, blood (Routine X 2) w Reflex to ID Panel     Status: None (Preliminary result)   Collection  Time: 08/06/17 11:41 AM  Result Value Ref Range Status   Specimen Description BLOOD RIGHT HAND  Final   Special Requests   Final    BOTTLES DRAWN AEROBIC AND ANAEROBIC Blood Culture adequate volume   Culture NO GROWTH 2 DAYS  Final   Report Status PENDING  Incomplete     Scheduled Meds: . atorvastatin  10 mg Per Tube QHS  . baclofen  5 mg Per Tube BID  . chlorhexidine gluconate (MEDLINE KIT)  15 mL Mouth Rinse BID  . diltiazem  60 mg Per Tube Q6H  . free water  300 mL Per Tube Q4H  . heparin injection (subcutaneous)  5,000 Units Subcutaneous Q8H  . levothyroxine  125 mcg Per Tube QAC breakfast  . mouth rinse  15 mL Mouth Rinse QID  . metoprolol tartrate  12.5 mg Per Tube BID  . pantoprazole sodium  40 mg Per Tube Daily  . senna-docusate  1 tablet Per Tube QHS     LOS: 8 days   Cherene Altes, MD [facility] Office  [phone] Pager - Text Page per Amion as per below:  On-Call/Text Page:      [email or web address]      password [email or web address]  If 7PM-7AM, please contact night-coverage [email or web address] Password [email or web address] 08/09/2017, 8:50 AM

## 2017-08-09 NOTE — Progress Notes (Signed)
ANTICOAGULATION CONSULT NOTE - Initial Consult  Pharmacy Consult for warfarin Indication: atrial fibrillation  No Known Allergies  Patient Measurements: Height: 5\' 11"  (180.3 cm) Weight: 170 lb 3.1 oz (77.2 kg) IBW/kg (Calculated) : 75.3  Vital Signs: Temp: 99.2 F (37.3 C) (12/04 0822) Temp Source: Oral (12/04 0822) BP: 114/34 (12/04 1000) Pulse Rate: 93 (12/04 1000)  Labs: Recent Labs    08/07/17 0508 08/07/17 0705 08/07/17 1246 08/08/17 0404  HGB  --  9.0*  --  8.9*  HCT  --  30.2*  --  30.7*  PLT  --  228  --  245  LABPROT 17.3*  --   --   --   INR 1.43  --   --   --   CREATININE 0.64  --  0.97 0.96    Estimated Creatinine Clearance: 82.8 mL/min (by C-G formula based on SCr of 0.96 mg/dL).   Medical History: Past Medical History:  Diagnosis Date  . A-fib (HCC)   . Hx of AKA (above knee amputation), right (HCC)   . Tracheostomy in place [facility](HCC)     Medications:  Medications Prior to Admission  Medication Sig Dispense Refill Last Dose  . acetaminophen (TYLENOL) 325 MG tablet Place 650 mg into feeding tube every 6 (six) hours as needed for mild pain (temp greater than than 100.6).    unknown  . aluminum-magnesium hydroxide 200-200 MG/5ML suspension Place 30 mLs into feeding tube every 6 (six) hours as needed (gas).    unknown  . amikacin (AMIKIN) IVPB Inject 100 mg into the vein daily at 6 (six) AM. Order date 08/02/17 - 14 day course for UTI   not yet given  . Amino Acids-Protein Hydrolys (FEEDING SUPPLEMENT, PRO-STAT SUGAR FREE 64,) LIQD Take 30 mLs by mouth 2 (two) times daily.   08/01/2017 at 900  . atorvastatin (LIPITOR) 10 MG tablet Place 10 mg into feeding tube at bedtime.    07/31/2017 at 2200  . baclofen (LIORESAL) 10 MG tablet Place 0.5 tablets (5 mg total) into feeding tube 2 (two) times daily.   08/01/2017 at 900  . Cefepime HCl 2 GM/100ML SOLN Inject 2 g into the vein 2 (two) times daily. Order date 08/01/17: 14 day course for UTI   not yet given  .  chlorhexidine (PERIDEX) 0.12 % solution Use as directed 15 mLs in the mouth or throat 2 (two) times daily.    08/01/2017 at 900  . clonazePAM (KLONOPIN) 0.5 MG tablet Place 0.25 mg into feeding tube at bedtime.    07/31/2017 at 2200  . Darbepoetin Alfa 25 MCG/ML SOLN Inject 25 mcg as directed every 28 (twenty-eight) days.    07/11/2017 at 900  . docusate sodium (COLACE) 100 MG capsule 100 mg 2 (two) times daily.   08/01/2017 at 900  . ferrous sulfate 300 (60 Fe) MG/5ML syrup Place 5 mLs (300 mg total) into feeding tube daily with breakfast. (Patient taking differently: Place 300 mg into feeding tube 2 (two) times daily with a meal. ) 150 mL 3 08/01/2017 at 900  . fluticasone (FLONASE) 50 MCG/ACT nasal spray Place 2 sprays into both nostrils daily.    08/01/2017 at 900  . furosemide (LASIX) 20 MG tablet Place 20 mg into feeding tube every other day.   08/01/2017 at 900  . heparin 5000 UNIT/ML injection Inject 5,000 Units into the skin every 12 (twelve) hours. For prophylactic   08/01/2017 at 900  . ipratropium-albuterol (DUONEB) 0.5-2.5 (3) MG/3ML SOLN Take 3  mLs by nebulization every 2 (two) hours as needed. (Patient taking differently: Take 3 mLs by nebulization every 6 (six) hours. ) 360 mL  08/01/2017 at 1400  . lactulose (CHRONULAC) 10 GM/15ML solution Place 20 g into feeding tube every 12 (twelve) hours. Hold for regular BMs X 3 days   08/01/2017 at 900  . lansoprazole (PREVACID) 30 MG capsule Place 30 mg into feeding tube daily before breakfast.   08/01/2017 at 630  . levothyroxine (SYNTHROID, LEVOTHROID) 125 MCG tablet Place 125 mcg into feeding tube daily before breakfast.   08/01/2017 at 630  . loratadine (CLARITIN) 10 MG tablet Place 10 mg into feeding tube daily.    08/01/2017 at 900  . metoprolol tartrate (LOPRESSOR) 25 MG tablet Place 0.5 tablets (12.5 mg total) into feeding tube 2 (two) times daily.   08/01/2017 at 900  . montelukast (SINGULAIR) 10 MG tablet Place 10 mg into feeding tube  at bedtime.    07/31/2017 at 2100  . Multiple Vitamin (MULTIVITAMIN WITH MINERALS) TABS tablet Place 1 tablet into feeding tube daily.    08/01/2017 at 900  . Nutritional Supplements (NEPRO) LIQD Place 1,200 mLs into feeding tube See admin instructions. 60 m./hr via pump per G-tube - start time 10am, run until 0600 or until total volume of 1200 ml is infused   08/01/2017 at 1000  . oxyCODONE (OXY IR/ROXICODONE) 5 MG immediate release tablet Take 5-10 mg by mouth every 6 (six) hours as needed (5 mg for moderate pain, 10 mg for severe pain).   07/29/2017 at 0449  . polyethylene glycol (MIRALAX / GLYCOLAX) packet Place 17 g into feeding tube every 12 (twelve) hours.    08/01/2017 at 900  . potassium chloride (KLOR-CON) 20 MEQ packet Place 20 mEq into feeding tube every 8 (eight) hours.   08/01/2017 at 600  . sodium phosphate (FLEET) 7-19 GM/118ML ENEM Place 1 enema rectally daily as needed for severe constipation (constipation).   07/16/2017  . STERILE WATER PO Place 200 mLs into feeding tube every 4 (four) hours.   08/01/2017 at day shift  . Vitamin D, Ergocalciferol, (DRISDOL) 50000 units CAPS capsule Place 50,000 Units into feeding tube every Friday.    07/29/2017 at 900  . Nutritional Supplements (FEEDING SUPPLEMENT, VITAL HIGH PROTEIN,) LIQD liquid Place 1,000 mLs into feeding tube daily. (Patient not taking: Reported on 08/01/2017)   Not Taking at Unknown time    Assessment: [age] YOM with PAF not on anticoagulation at home likely due to history of R renal collecting system hemorrhage which has resolved. Pharmacy consulted to start warfarin. H/H low but trending up. Plt wnl  No significant drug interactions noted   Goal of Therapy:  INR 2-3 Monitor platelets by anticoagulation protocol: Yes   Plan:  -Warfarin 5 mg x 1 dose tonight -Monitor daily PT/INR and s/s of bleeding -D/c subQ heparin when INR > 2   Vinnie LevelBenjamin Devan Babino, PharmD., BCPS Clinical Pharmacist Pager [phone]

## 2017-08-09 NOTE — NC FL2 (Signed)
[place] LEVEL OF CARE SCREENING TOOL     IDENTIFICATION  Patient Name: Craig Oneal. Birthdate: 08-17-1951 Sex: male Admission Date (Current Location): 08/01/2017  [facility] and [place] Number:  Herbalist and Address:  The . [facility], [place] [address], [place], [place] [address]      Provider Number: [id number]  Attending Physician Name and Address:  Cherene Altes, MD  Relative Name and Phone Number:       Current Level of Care: SNF Recommended Level of Care: [place] Prior Approval Number:    Date Approved/Denied:   PASRR Number:   [id number]   Discharge Plan: SNF    Current Diagnoses: Patient Active Problem List   Diagnosis Date Noted  . Candidemia ([place])   . Tracheostomy status ([place])   . Hypoxemia   . Acute on chronic respiratory failure with hypoxemia ([place])   . Acute respiratory failure ([place])   . 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 Acute encephalopathy 08/01/2017  . Healthcare-associated pneumonia   . Hyperkalemia   . Acute kidney injury ([place])   . Atrial fibrillation with rapid ventricular response ([place])   . Acute pulmonary edema (HCC)   . Pressure injury of skin 01/23/2017  . Septic shock ([place]) 01/22/2017    Orientation RESPIRATION BLADDER Height & Weight        Tracheostomy Continent Weight: 170 lb 3.1 oz (77.2 kg) Height:  5' 11"  (180.3 cm)  BEHAVIORAL SYMPTOMS/MOOD NEUROLOGICAL BOWEL NUTRITION STATUS      Incontinent Diet(please see discharge summary. )  AMBULATORY STATUS COMMUNICATION OF NEEDS Skin   Extensive Assist   Surgical wounds(trach. )                       Personal Care Assistance Level of Assistance  Bathing, Feeding, Dressing Bathing Assistance: Maximum assistance Feeding assistance: Maximum assistance Dressing Assistance: Maximum assistance     Functional Limitations Info  Sight, Hearing, Speech Sight Info:  Adequate Hearing Info: Adequate Speech Info: (pt has trach.)    SPECIAL CARE FACTORS FREQUENCY                       Contractures Contractures Info: Not present    Additional Factors Info  Code Status, Allergies Code Status Info: Full Allergies Info: NKA           Current Medications (08/09/2017):  This is the current hospital active medication list Current Facility-Administered Medications  Medication Dose Route Frequency Provider Last Rate Last Dose  . acetaminophen (TYLENOL) tablet 650 mg  650 mg Per Tube Q4H PRN Cherene Altes, MD      . anidulafungin (ERAXIS) 100 mg in sodium chloride 0.9 % 100 mL IVPB  100 mg Intravenous Q24H Chesley Mires, MD   Stopped at 08/09/17 [phone]  . atorvastatin (LIPITOR) tablet 10 mg  10 mg Per Tube QHS Cherene Altes, MD   10 mg at 08/08/17 2100  . baclofen (LIORESAL) tablet 5 mg  5 mg Per Tube BID Hammons, Kimberly B, RPH   5 mg at 08/09/17 0940  . cefTAZidime (FORTAZ) 2 g in dextrose 5 % 50 mL IVPB  2 g Intravenous [address], [facility]   Stopped at 08/09/17 5188  . chlorhexidine gluconate (MEDLINE KIT) (PERIDEX) 0.12 % solution 15 mL  15 mL Mouth Rinse BID Arnell Asal, NP   15  mL at 08/09/17 0735  . dextrose 5 % with KCl 20 mEq / L  infusion  20 mEq Intravenous Continuous Cherene Altes, MD 75 mL/hr at 08/09/17 0940 20 mEq at 08/09/17 0940  . diltiazem (CARDIZEM) 10 mg/ml oral suspension 60 mg  60 mg Per Tube Q6H Cherene Altes, MD   60 mg at 08/09/17 0557  . feeding supplement (VITAL AF 1.2 CAL) liquid 1,000 mL  1,000 mL Per Tube Continuous Allie Bossier, MD 70 mL/hr at 08/09/17 0600 1,000 mL at 08/09/17 0600  . fentaNYL (SUBLIMAZE) injection 25-50 mcg  25-50 mcg Intravenous Q2H PRN Cherene Altes, MD      . free water 350 mL  350 mL Per Tube Q4H Cherene Altes, MD   350 mL at 08/09/17 1004  . heparin injection 5,000 Units  5,000 Units Subcutaneous Q8H Allie Bossier, MD   5,000 Units at 08/09/17 0600  .  ipratropium-albuterol (DUONEB) 0.5-2.5 (3) MG/3ML nebulizer solution 3 mL  3 mL Nebulization Q2H PRN Cherene Altes, MD      . levothyroxine (SYNTHROID, LEVOTHROID) tablet 125 mcg  125 mcg Per Tube QAC breakfast Aldean Jewett, MD   125 mcg at 08/09/17 0732  . lidocaine (XYLOCAINE) 2 % viscous mouth solution    PRN Sandi Mariscal, MD   5 mL at 08/03/17 1629  . MEDLINE mouth rinse  15 mL Mouth Rinse QID Jennelle Human B, NP   15 mL at 08/09/17 0400  . metoprolol tartrate (LOPRESSOR) tablet 12.5 mg  12.5 mg Per Tube BID Cherene Altes, MD   12.5 mg at 08/09/17 0940  . pantoprazole sodium (PROTONIX) 40 mg/20 mL oral suspension 40 mg  40 mg Per Tube Daily Harvel Quale, RPH   40 mg at 08/08/17 2043  . senna-docusate (Senokot-S) tablet 1 tablet  1 tablet Per Tube QHS Mannam, Praveen, MD   1 tablet at 08/06/17 2116  . warfarin (COUMADIN) tablet 5 mg  5 mg Oral ONCE-1800 Mancheril, Darnell Level, RPH      . Warfarin - Pharmacist Dosing Inpatient   Does not apply q1800 Lavenia Atlas [facility]         Discharge Medications: Please see discharge summary for a list of discharge medications.  Relevant Imaging Results:  Relevant Lab Results:   Additional Information [id number]  Wetzel Bjornstad, LCSWA

## 2017-08-09 NOTE — Progress Notes (Signed)
Follow-up Nutrition Assessment  DOCUMENTATION CODES:   Not applicable  INTERVENTION:   Continue:  Vital AF 1.2 at 70 ml/h (1680 ml per day)  Provides 2016 kcal, 126 gm protein, 1362 ml free water daily  NUTRITION DIAGNOSIS:   Inadequate oral intake related to inability to eat as evidenced by NPO status.  Ongoing  GOAL:   Patient will meet greater than or equal to 90% of their needs   Met with TF  MONITOR:   TF tolerance, Vent status, I & O's  ASSESSMENT:   66 yo male with PMH of chronic trach & vent dependence, PEG, cerebellar atrophy, functional paraplegia, R AKA (1 month PTA), A fib, COPD, HF, and hypothyroidism who was admitted on 11/26 from [facility] with weakness, AMS, AKI, sepsis.  Discussed patient in ICU rounds and with RN today. Patient is now back to baseline of trach collar during the day and vent support at night.  PEG was replaced in IR 11/28. Currently on trach collar. Patient is currently receiving Vital AF 1.2 via PEG at 70 ml/h (1680 ml/day) to provide 2016 kcals, 126 gm protein, 1362 ml free water daily.  Free water 350 ml every 4 hours Labs reviewed. Sodium 149 (H) CBG's: [id number] Medications reviewed.  Given mild-moderate muscle depletion, patient is at increased nutrition risk.  Diet Order:  Diet NPO time specified  EDUCATION NEEDS:   No education needs have been identified at this time  Skin:  Skin Assessment: Skin Integrity Issues: Skin Integrity Issues:: Stage I, Stage II, Incisions Stage I: sacrum, thigh Stage II: RLE Incisions: RLE  Last BM:  12/4  Height:   Ht Readings from Last 1 Encounters:  08/02/17 _0  (1.803 m)    Weight:   Wt Readings from Last 1 Encounters:  08/09/17 170 lb 3.1 oz (77.2 kg)    Ideal Body Weight:  71.9 kg  BMI:  27 (adjusted for AKA)  Estimated Nutritional Needs:   Kcal:  2000-2200  Protein:  110-125 gm  Fluid:  2-2.2 L   Molli Barrows, RD, LDN, CNSC Pager [phone] After  Hours Pager [phone]

## 2017-08-09 NOTE — Progress Notes (Signed)
CSW spoke with Misty StanleyStacey from [facility] and was informed that they will take pt back once pt is ready and once she has a bed for pt.   Claude MangesKierra S. Cintya Daughety, MSW, LCSW-A Emergency Department Clinical Social Worker [phone]

## 2017-08-10 ENCOUNTER — Inpatient Hospital Stay (HOSPITAL_COMMUNITY): Payer: Medicare Other

## 2017-08-10 DIAGNOSIS — Z89611 Acquired absence of right leg above knee: Secondary | ICD-10-CM

## 2017-08-10 DIAGNOSIS — K819 Cholecystitis, unspecified: Secondary | ICD-10-CM

## 2017-08-10 LAB — CBC
HEMATOCRIT: 33.3 % — AB (ref 39.0–52.0)
HEMOGLOBIN: 10 g/dL — AB (ref 13.0–17.0)
MCH: 24.8 pg — ABNORMAL LOW (ref 26.0–34.0)
MCHC: 30 g/dL (ref 30.0–36.0)
MCV: 82.6 fL (ref 78.0–100.0)
Platelets: 249 10*3/uL (ref 150–400)
RBC: 4.03 MIL/uL — ABNORMAL LOW (ref 4.22–5.81)
RDW: 17.9 % — ABNORMAL HIGH (ref 11.5–15.5)
WBC: 13.1 10*3/uL — AB (ref 4.0–10.5)

## 2017-08-10 LAB — GLUCOSE, CAPILLARY
GLUCOSE-CAPILLARY: 103 mg/dL — AB (ref 65–99)
GLUCOSE-CAPILLARY: 105 mg/dL — AB (ref 65–99)
GLUCOSE-CAPILLARY: 108 mg/dL — AB (ref 65–99)
GLUCOSE-CAPILLARY: 115 mg/dL — AB (ref 65–99)
Glucose-Capillary: 106 mg/dL — ABNORMAL HIGH (ref 65–99)
Glucose-Capillary: 109 mg/dL — ABNORMAL HIGH (ref 65–99)
Glucose-Capillary: 119 mg/dL — ABNORMAL HIGH (ref 65–99)

## 2017-08-10 LAB — COMPREHENSIVE METABOLIC PANEL
ALK PHOS: 226 U/L — AB (ref 38–126)
ALT: 33 U/L (ref 17–63)
ANION GAP: 10 (ref 5–15)
AST: 24 U/L (ref 15–41)
Albumin: 1.9 g/dL — ABNORMAL LOW (ref 3.5–5.0)
BILIRUBIN TOTAL: 0.3 mg/dL (ref 0.3–1.2)
BUN: 26 mg/dL — ABNORMAL HIGH (ref 6–20)
CALCIUM: 8.1 mg/dL — AB (ref 8.9–10.3)
CO2: 30 mmol/L (ref 22–32)
Chloride: 100 mmol/L — ABNORMAL LOW (ref 101–111)
Creatinine, Ser: 0.73 mg/dL (ref 0.61–1.24)
Glucose, Bld: 111 mg/dL — ABNORMAL HIGH (ref 65–99)
POTASSIUM: 3.8 mmol/L (ref 3.5–5.1)
Sodium: 140 mmol/L (ref 135–145)
TOTAL PROTEIN: 8.3 g/dL — AB (ref 6.5–8.1)

## 2017-08-10 LAB — PROTIME-INR
INR: 1.07
PROTHROMBIN TIME: 13.8 s (ref 11.4–15.2)

## 2017-08-10 MED ORDER — WARFARIN SODIUM 5 MG PO TABS
5.0000 mg | ORAL_TABLET | Freq: Once | ORAL | Status: AC
  Administered 2017-08-10: 5 mg via ORAL
  Filled 2017-08-10: qty 1

## 2017-08-10 NOTE — Progress Notes (Signed)
ANTICOAGULATION CONSULT NOTE - Initial Consult  Pharmacy Consult for warfarin Indication: atrial fibrillation  No Known Allergies  Patient Measurements: Height: 5\' 11"  (180.3 cm) Weight: 170 lb 3.1 oz (77.2 kg) IBW/kg (Calculated) : 75.3  Vital Signs: Temp: 98.8 F (37.1 C) (12/05 0356) Temp Source: Oral (12/05 0356) BP: 103/60 (12/05 0600) Pulse Rate: 117 (12/05 0600)  Labs: Recent Labs    08/07/17 1246 08/08/17 0404 08/10/17 0302  HGB  --  8.9* 10.0*  HCT  --  30.7* 33.3*  PLT  --  245 249  LABPROT  --   --  13.8  INR  --   --  1.07  CREATININE 0.97 0.96 0.73    Estimated Creatinine Clearance: 99.4 mL/min (by C-G formula based on SCr of 0.73 mg/dL).   Assessment: [age] YOM with PAF not on anticoagulation at home likely due to history of R renal collecting system hemorrhage which has resolved. Pharmacy consulted to start warfarin. H/H low but trending up. Plt wnl  No significant drug interactions noted   Goal of Therapy:  INR 2-3 Monitor platelets by anticoagulation protocol: Yes   Plan:  -Warfarin 5 mg x 1 dose tonight -Monitor daily PT/INR and s/s of bleeding -D/c subQ heparin when INR > 2   Isaac BlissMichael Briele Lagasse, PharmD, BCPS, BCCCP Clinical Pharmacist Clinical phone for 08/10/2017 from 7a-3:30p: [phone] If after 3:30p, please call main pharmacy at: [phone] 08/10/2017 8:20 AM

## 2017-08-10 NOTE — Progress Notes (Addendum)
PROGRESS NOTE    Craig Oneal.  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 Blood cultures sent and started on cefepime 11/26 and amikacin 11/27.  Additionally, daughter reports patient normally requires ventilator at night normally but has required continously for the last two days.     Patient has been at [facility] since February 2018; prior to was at [facility].  Hospitalized at [facility] in 01/2017 with proteus bacteremia, septic and hemorrhagic shock.  Additionally has history of serratia and pseudomonas from tracheal aspirate in July, both sensitive to Cefepime, as well as enterobacter and morganella.  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      Subjective: 12/5 eyes open, patient alert, nods yes and no appropriately to questions. Negative CP, negative SOB. 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   -If repeat blood cultures negative will have PICC line placed on 12/6. Continue antifungal/antibiotics per ID.   Chronic systolic CHF -Strict in and out since admission +15.4 L -Daily weight Filed Weights   08/09/17 0333 08/10/17 0300 08/11/17 0426  Weight: 170 lb 3.1 oz (77.2 kg) 170 lb 3.1 oz (77.2 kg) 172 lb 13.5 oz (78.4 kg)  -Cardizem 60 mg QID -Transfuse for hemoglobin <8  Pulmonary HTN -See CHF  Chronic Atrial Fibrillation -Prior to admission was on warfarin initially held. . -Warfarin restarted on 12/4 per pharmacy   Acute on CKD stage III  -Patient was chronic Foley -RIGHT renal collecting system hemorrhage -Secondary to bilateral hydronephrosis, bacteremia. Per urology note appears   -Per urology believed source of bacteremia (Candida Glabrata from GU tract specifically RIGHT kidney).  -12/2 per Dr. Irine Seal Urology noted after further evaluation no requirement for RIGHT Nephrostomy tube placement.  -Restart tube feeds  Hypernatremia  Hypokalemia  -Potassium goal > 4  Hypomagnesemia -Magnesium goal> 2  Chronic foley  Cholecystitis? -Per renal ultrasound patient with possible cholecystitis given patient's quadriplegia and inability to communicate will obtain RUQ abdominal ultrasound per radiology recommendation -Abdominal ultrasound inconclusive for cholecystitis. Patient clinically improving, continue to monitor liver enzymes and WBC.  Dysphagia -NPO -Gtube-->malpositioned >  replaced by IR -Transaminitis -  appears chronic based on May 2018 labs -12/2 Restart vital AF 1.2 CAL 70 ml/hr   Acute blood loss anemia  -presumably from area noted on CT scan from right renal collecting system noting concern for hemorrhage -11/27 transfused 2 units PRBC -12/1 transfuse 1 unit PRBC  -Transfuse for hemoglobin<8 Recent Labs  Lab 08/04/17 0444 08/05/17 0533 08/06/17 0533 08/07/17 0705 08/08/17 0404 08/10/17 0302  HGB 7.5* 7.2* 7.4* 9.0* 8.9* 10.0*      Hypothyroidism  -Synthroid 125 g daily   Acute encephalopathy - Infection/sepsis ? -Hx cerebral atrophy, paraplegia w/ataxia -Improved, patient not having yes and no to questions appropriately.  Right AKA -Review of EMR does not show exact date of surgery or who performed surgery. Will contact our orthopedic surgeons appear staples may be ready to be removed.     DVT prophylaxis: Warfarin per pharmacy Code Status: Full Family Communication: None Disposition Plan: TBD   Consultants:  [facility]rology ID     Procedures/Significant Events:   STUDIES:  11/26 CXR >> bilateral infiltrates vs edema 11/27 CXR >> stable bilateral opacities 11/27 Echocardiogram :  LVEF = 40-45% with global hypokinesis and inferior basal akinesis.   -RIGHT [facility]: moderate tricuspid valve regurg.  -Tricuspid valve: Moderate regurgitation -Pulmonary arteries PA peak pressure 51 mmHg   11/27 CT chest and abdomen : Percutaneous gastrostomy tube retracted into the abdominal wall.  Radiologist reports what appears to be mature tract to the gastric lumen given oral contrast injected through catheter did not extravasate.  There is no pneumoperitoneum or fluid collection.  Advanced bilateral hydronephrosis with upper hydroureter.  High density in the right renal collecting system consistent with hemorrhage.  Left greater than right lower lobe airspace disease. 11/29 CT abdomen pelvis W contrast:-Gastrostomy tube appears be in good position w/ocomplicating  features. - Small bilateral pleural effusions and bibasilar atelectasis. -Cholelithiasis without definite CT findings for acute cholecystitis. - Right-sided hydroureteronephrosis without obvious cause.  12/1 Renal ultrasound:-Cholelithiasis. -Gallbladder wall is thickened to 5 mm. Dedicated ultrasound of the gallbladder and/or a HIDA scan may be helpful if there is concern for acute cholecystitis. -Previously identified right-sided hydronephrosis has almost completely resolved.  -Left renal cysts, incompletely characterized. 12/2 RUQ abdominal ultrasound:- A few small stones and some sludge are seen in the gallbladder with wall thickening. -No pericholecystic fluid.  -A Murphy's sign cannot be assessed as the patient is unresponsive.  -A HIDA scan could further evaluate for cystic duct patency/acute cholecystitis as clinically warranted. -The common bile duct measures 6 mm which is borderline with no significant dilatation.        I have personally reviewed and interpreted all radiology studies and my findings are as above.  VENTILATOR SETTINGS: Trach collar FiO2: 40%   Cultures Prior cultures at [facility] 11/23 ? 11/26 blood 2/2 positive coag negative staph  11/26 blood positive Candida Glabrata 11/26 Trach Aspirate: Positive Pseudomonas Aeruginosa  11/26 UC positive 30,000 colonies yeast 11/27 MRSA by PCR negative  11/28 blood positive yeast 12/1 blood pending   Antimicrobials: Anti-infectives (From admission, onward)   Start     Stop   08/06/17 2200  ceftazidime-avibactam (AVYCAZ) 2.5 g in dextrose 5 % 50 mL IVPB  Status:  Discontinued     08/06/17 1547   08/06/17 1700  cefTAZidime (FORTAZ) 2 g in dextrose 5 % 50 mL IVPB     08/14/17 2359   08/06/17 0400  anidulafungin (ERAXIS) 100 mg in sodium chloride 0.9 %  100 mL IVPB         08/05/17 0400  anidulafungin (ERAXIS) 200 mg in sodium chloride 0.9 % 200 mL IVPB     08/05/17 0838   08/04/17 1400  ceftazidime-avibactam  (AVYCAZ) 1.25 g in dextrose 5 % 50 mL IVPB  Status:  Discontinued     08/06/17 1431   08/02/17 2100  vancomycin (VANCOCIN) IVPB 750 mg/150 ml premix  Status:  Discontinued     08/02/17 1630   08/02/17 2100  ceFEPIme (MAXIPIME) 1 g in dextrose 5 % 50 mL IVPB  Status:  Discontinued     08/02/17 1501   08/02/17 1800  linezolid (ZYVOX) IVPB 600 mg  Status:  Discontinued     08/04/17 1452   08/02/17 1600  ceftazidime-avibactam (AVYCAZ) 0.94 g in dextrose 5 % 50 mL IVPB  Status:  Discontinued     08/04/17 0838   08/01/17 1830  ceFEPIme (MAXIPIME) 2 g in dextrose 5 % 50 mL IVPB     08/01/17 2045   08/01/17 1745  vancomycin (VANCOCIN) 1,500 mg in sodium chloride 0.9 % 500 mL IVPB     08/01/17 2242   08/01/17 1730  piperacillin-tazobactam (ZOSYN) IVPB 3.375 g  Status:  Discontinued     08/01/17 1818   08/01/17 1730  vancomycin (VANCOCIN) IVPB 1000 mg/200 mL premix  Status:  Discontinued     08/01/17 1739       Devices   LINES / TUBES:  RIGHT PICC from [facility] -  Gastrostomy tube   Foley>> changed 11/26 >>  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 anidulafungin Stopped (08/10/17 0532)  . cefTAZidime (FORTAZ)  IV Stopped (08/10/17 0347)  . dextrose 5 % with KCl 20 mEq / L 20 mEq (08/10/17 0600)  . feeding supplement (VITAL AF 1.2 CAL) 1,000 mL (08/10/17 0604)     Objective: Vitals:   08/10/17 0400 08/10/17 0421 08/10/17 0500 08/10/17 0600  BP: (!) 90/45  105/61 103/60  Pulse: 68 79 71 (!) 117  Resp: (!) _0 Temp:      TempSrc:      SpO2: 100% 98% 99% 100%  Weight:      Height:        Intake/Output Summary (Last 24 hours) at 08/10/2017 0730 Last data filed at 08/10/2017 0600 Gross per 24 hour  Intake 4915 ml  Output 1260 ml  Net 3655 ml   Filed Weights   08/08/17 0500 08/09/17 0333 08/10/17 0300  Weight: 166 lb 3.6 oz (75.4 kg) 170 lb 3.1 oz (77.2 kg) 170 lb 3.1 oz (77.2 kg)    Physical Exam:  General: Eyes open, alert, nods yes and no  appropriately to questions, positive acute respiratory distress Neck:  Negative scars, masses, torticollis, lymphadenopathy, JVD #7 cuffed trach in place, covered and clean, negative sign of infection Lungs: Clear to auscultation bilaterally without wheezes or crackles Cardiovascular: Regular rate and rhythm without murmur gallop or rub normal S1 and S2 Abdomen: negative abdominal pain, nondistended, positive soft, bowel sounds, no rebound, no ascites, no appreciable mass, PEG tube in place, negative sign of infection Extremities: No significant cyanosis, clubbing, or edema LLE. RIGHT AKA staples still in place well-healed, negative sign of infection, negative discharge Skin: Negative rashes, lesions, ulcers Psychiatric:  Unable to assess secondary to patient being trached  Central nervous system:  Quadriplegia, sensation intact grimaces to painful stimuli. Negative receptive aphasia. Nods yes and no appropriately to questions  .  Data Reviewed: Care during the described time interval was provided by me .  I have reviewed this patient's available data, including medical history, events of note, physical examination, and all test results as part of my evaluation.   CBC: Recent Labs  Lab 08/05/17 0533 08/06/17 0533 08/07/17 0705 08/08/17 0404 08/10/17 0302  WBC 10.2 11.2* 13.2* 12.7* 13.1*  NEUTROABS  --   --  9.5* 9.3*  --   HGB 7.2* 7.4* 9.0* 8.9* 10.0*  HCT 24.6* 25.4* 30.2* 30.7* 33.3*  MCV 79.6 80.4 81.0 83.0 82.6  PLT 181 184 228 245 081   Basic Metabolic Panel: Recent Labs  Lab 08/03/17 1450 08/03/17 1724 08/04/17 0444 08/04/17 1606 08/05/17 0533 08/06/17 0533 08/07/17 0508 08/07/17 1246 08/08/17 0404 08/10/17 0302  NA  --   --  145  --  147* 148* 149* 148* 149* 140  K  --   --  3.4*  --  3.3* 3.3* 2.3* 4.1 3.7 3.8  CL  --   --  118*  --  118* 116* 128* 115* 116* 100*  CO2  --   --  21*  --  23 25 17* _0 GLUCOSE  --   --  155*  --  107* 109* 71 92 109*  111*  BUN  --   --  89*  --  63* 43* 21* 31* 28* 26*  CREATININE  --   --  2.20*  --  1.66* 1.28* 0.64 0.97 0.96 0.73  CALCIUM  --   --  8.6*  --  8.3* 8.0* 4.7* 7.8* 7.9* 8.1*  MG 1.9 1.9 2.0 1.7 1.7 1.4*  --  1.8  --   --   PHOS 3.6 3.7 2.9 2.9 2.3*  --   --   --   --   --    GFR: Estimated Creatinine Clearance: 99.4 mL/min (by C-G formula based on SCr of 0.73 mg/dL). Liver Function Tests: Recent Labs  Lab 08/03/17 1200 08/10/17 0302  AST  --  24  ALT  --  33  ALKPHOS  --  226*  BILITOT  --  0.3  PROT  --  8.3*  ALBUMIN 1.6* 1.9*   No results for input(s): LIPASE, AMYLASE in the last 168 hours. No results for input(s): AMMONIA in the last 168 hours. Coagulation Profile: Recent Labs  Lab 08/07/17 0508 08/10/17 0302  INR 1.43 1.07   Cardiac Enzymes: No results for input(s): CKTOTAL, CKMB, CKMBINDEX, TROPONINI in the last 168 hours. BNP (last 3 results) No results for input(s): PROBNP in the last 8760 hours. HbA1C: No results for input(s): HGBA1C in the last 72 hours. CBG: Recent Labs  Lab 08/09/17 1150 08/09/17 1540 08/09/17 2018 08/10/17 0017 08/10/17 0353  GLUCAP 108* 106* 122* 108* 103*   Lipid Profile: No results for input(s): CHOL, HDL, LDLCALC, TRIG, CHOLHDL, LDLDIRECT in the last 72 hours. Thyroid Function Tests: No results for input(s): TSH, T4TOTAL, FREET4, T3FREE, THYROIDAB in the last 72 hours. Anemia Panel: No results for input(s): VITAMINB12, FOLATE, FERRITIN, TIBC, IRON, RETICCTPCT in the last 72 hours. Urine analysis:    Component Value Date/Time   COLORURINE YELLOW 08/01/2017 2051   APPEARANCEUR CLOUDY (A) 08/01/2017 2051   LABSPEC 1.020 08/01/2017 2051   PHURINE 5.5 08/01/2017 2051   GLUCOSEU NEGATIVE 08/01/2017 2051   HGBUR LARGE (A) 08/01/2017 2051   BILIRUBINUR NEGATIVE 08/01/2017 2051   [place] 08/01/2017 2051   PROTEINUR 100 (A) 08/01/2017 2051  NITRITE NEGATIVE 08/01/2017 2051   LEUKOCYTESUR MODERATE (A) 08/01/2017  2051   Sepsis Labs: _0 (procalcitonin:4,lacticidven:4)  ) Recent Results (from the past 240 hour(s))  Blood Culture (routine x 2)     Status: Abnormal   Collection Time: 08/01/17  5:22 PM  Result Value Ref Range Status   Specimen Description BLOOD LEFT HAND  Final   Special Requests   Final    Blood Culture adequate volume BOTTLES DRAWN AEROBIC AND ANAEROBIC   Culture  Setup Time   Final    GRAM POSITIVE COCCI IN CLUSTERS ANAEROBIC BOTTLE ONLY CRITICAL RESULT CALLED TO, READ BACK BY AND VERIFIED WITH: PHARMD G ABBOTT [id number] MLM YEAST AEROBIC BOTTLE ONLY CRITICAL RESULT CALLED TO, READ BACK BY AND VERIFIED WITH: K WEIGLE PHARMD 0330 08/05/17 A BROWNING    Culture (A)  Final    STAPHYLOCOCCUS SPECIES (COAGULASE NEGATIVE) THE SIGNIFICANCE OF ISOLATING THIS ORGANISM FROM A SINGLE SET OF BLOOD CULTURES WHEN MULTIPLE SETS ARE DRAWN IS UNCERTAIN. PLEASE NOTIFY THE MICROBIOLOGY DEPARTMENT WITHIN ONE WEEK IF SPECIATION AND SENSITIVITIES ARE REQUIRED. CANDIDA GLABRATA CORRECTED ON 12/01 AT 0840: PREVIOUSLY REPORTED AS YEAST    Report Status 08/07/2017 FINAL  Final  Blood Culture ID Panel (Reflexed)     Status: Abnormal   Collection Time: 08/01/17  5:22 PM  Result Value Ref Range Status   Enterococcus species NOT DETECTED NOT DETECTED Final   Listeria monocytogenes NOT DETECTED NOT DETECTED Final   Staphylococcus species DETECTED (A) NOT DETECTED Final    Comment: Methicillin (oxacillin) resistant coagulase negative staphylococcus. Possible blood culture contaminant (unless isolated from more than one blood culture draw or clinical case suggests pathogenicity). No antibiotic treatment is indicated for blood  culture contaminants. CRITICAL RESULT CALLED TO, READ BACK BY AND VERIFIED WITH: PHARMD G ABBOTT [id number] MLM    Staphylococcus aureus NOT DETECTED NOT DETECTED Final   Methicillin resistance DETECTED (A) NOT DETECTED Final    Comment: CRITICAL RESULT CALLED TO, READ  BACK BY AND VERIFIED WITH: PHARMD G ABBOTT [id number] MLM    Streptococcus species NOT DETECTED NOT DETECTED Final   Streptococcus agalactiae NOT DETECTED NOT DETECTED Final   Streptococcus pneumoniae NOT DETECTED NOT DETECTED Final   Streptococcus pyogenes NOT DETECTED NOT DETECTED Final   Acinetobacter baumannii NOT DETECTED NOT DETECTED Final   Enterobacteriaceae species NOT DETECTED NOT DETECTED Final   Enterobacter cloacae complex NOT DETECTED NOT DETECTED Final   Escherichia coli NOT DETECTED NOT DETECTED Final   Klebsiella oxytoca NOT DETECTED NOT DETECTED Final   Klebsiella pneumoniae NOT DETECTED NOT DETECTED Final   Proteus species NOT DETECTED NOT DETECTED Final   Serratia marcescens NOT DETECTED NOT DETECTED Final   Haemophilus influenzae NOT DETECTED NOT DETECTED Final   Neisseria meningitidis NOT DETECTED NOT DETECTED Final   Pseudomonas aeruginosa NOT DETECTED NOT DETECTED Final   Candida albicans NOT DETECTED NOT DETECTED Final   Candida glabrata NOT DETECTED NOT DETECTED Final   Candida krusei NOT DETECTED NOT DETECTED Final   Candida parapsilosis NOT DETECTED NOT DETECTED Final   Candida tropicalis NOT DETECTED NOT DETECTED Final  Blood Culture ID Panel (Reflexed)     Status: Abnormal   Collection Time: 08/01/17  5:22 PM  Result Value Ref Range Status   Enterococcus species NOT DETECTED NOT DETECTED Final   Listeria monocytogenes NOT DETECTED NOT DETECTED Final   Staphylococcus species NOT DETECTED NOT DETECTED Final   Staphylococcus aureus NOT DETECTED NOT DETECTED Final   Streptococcus species  NOT DETECTED NOT DETECTED Final   Streptococcus agalactiae NOT DETECTED NOT DETECTED Final   Streptococcus pneumoniae NOT DETECTED NOT DETECTED Final   Streptococcus pyogenes NOT DETECTED NOT DETECTED Final   Acinetobacter baumannii NOT DETECTED NOT DETECTED Final   Enterobacteriaceae species NOT DETECTED NOT DETECTED Final   Enterobacter cloacae complex NOT DETECTED NOT  DETECTED Final   Escherichia coli NOT DETECTED NOT DETECTED Final   Klebsiella oxytoca NOT DETECTED NOT DETECTED Final   Klebsiella pneumoniae NOT DETECTED NOT DETECTED Final   Proteus species NOT DETECTED NOT DETECTED Final   Serratia marcescens NOT DETECTED NOT DETECTED Final   Haemophilus influenzae NOT DETECTED NOT DETECTED Final   Neisseria meningitidis NOT DETECTED NOT DETECTED Final   Pseudomonas aeruginosa NOT DETECTED NOT DETECTED Final   Candida albicans NOT DETECTED NOT DETECTED Final   Candida glabrata DETECTED (A) NOT DETECTED Final    Comment: CRITICAL RESULT CALLED TO, READ BACK BY AND VERIFIED WITH: K WEIGLE PHARMD 0330 08/05/17 A BROWNING    Candida krusei NOT DETECTED NOT DETECTED Final   Candida parapsilosis NOT DETECTED NOT DETECTED Final   Candida tropicalis NOT DETECTED NOT DETECTED Final  Blood Culture (routine x 2)     Status: None   Collection Time: 08/01/17  7:05 PM  Result Value Ref Range Status   Specimen Description BLOOD RIGHT PICC LINE  Final   Special Requests IN PEDIATRIC BOTTLE Blood Culture adequate volume  Final   Culture NO GROWTH 5 DAYS  Final   Report Status 08/06/2017 FINAL  Final  Culture, respiratory (tracheal aspirate)     Status: None   Collection Time: 08/01/17 11:56 PM  Result Value Ref Range Status   Specimen Description TRACHEAL ASPIRATE  Final   Special Requests NONE  Final   Gram Stain   Final    ABUNDANT WBC PRESENT,BOTH PMN AND MONONUCLEAR MODERATE GRAM NEGATIVE RODS RARE GRAM POSITIVE RODS    Culture FEW PSEUDOMONAS AERUGINOSA  Final   Report Status 08/06/2017 FINAL  Final   Organism ID, Bacteria PSEUDOMONAS AERUGINOSA  Final      Susceptibility   Pseudomonas aeruginosa - MIC*    CEFTAZIDIME 4 SENSITIVE Sensitive     CIPROFLOXACIN <=0.25 SENSITIVE Sensitive     GENTAMICIN 8 INTERMEDIATE Intermediate     IMIPENEM >=16 RESISTANT Resistant     PIP/TAZO 16 SENSITIVE Sensitive     CEFEPIME 16 INTERMEDIATE Intermediate     *  FEW PSEUDOMONAS AERUGINOSA  Urine culture     Status: Abnormal   Collection Time: 08/01/17 11:56 PM  Result Value Ref Range Status   Specimen Description URINE, CATHETERIZED  Final   Special Requests NONE  Final   Culture 30,000 COLONIES/mL YEAST (A)  Final   Report Status 08/03/2017 FINAL  Final  MRSA PCR Screening     Status: None   Collection Time: 08/02/17  1:05 AM  Result Value Ref Range Status   MRSA by PCR NEGATIVE NEGATIVE Final    Comment:        The GeneXpert MRSA Assay (FDA approved for NASAL specimens only), is one component of a comprehensive MRSA colonization surveillance program. It is not intended to diagnose MRSA infection nor to guide or monitor treatment for MRSA infections.   Culture, blood (Routine X 2) w Reflex to ID Panel     Status: None   Collection Time: 08/03/17  3:16 PM  Result Value Ref Range Status   Specimen Description BLOOD LEFT HAND  Final  Special Requests IN PEDIATRIC BOTTLE Blood Culture adequate volume  Final   Culture NO GROWTH 5 DAYS  Final   Report Status 08/08/2017 FINAL  Final  Culture, blood (Routine X 2) w Reflex to ID Panel     Status: Abnormal   Collection Time: 08/03/17  3:28 PM  Result Value Ref Range Status   Specimen Description BLOOD LEFT HAND  Final   Special Requests IN PEDIATRIC BOTTLE Blood Culture adequate volume  Final   Culture  Setup Time   Final    YEAST IN PEDIATRIC BOTTLE CRITICAL VALUE NOTED.  VALUE IS CONSISTENT WITH PREVIOUSLY REPORTED AND CALLED VALUE.    Culture CANDIDA ALBICANS (A)  Final   Report Status 08/07/2017 FINAL  Final  Culture, blood (Routine X 2) w Reflex to ID Panel     Status: None (Preliminary result)   Collection Time: 08/06/17 11:40 AM  Result Value Ref Range Status   Specimen Description BLOOD LEFT ANTECUBITAL  Final   Special Requests   Final    BOTTLES DRAWN AEROBIC AND ANAEROBIC Blood Culture results may not be optimal due to an excessive volume of blood received in culture bottles    Culture NO GROWTH 3 DAYS  Final   Report Status PENDING  Incomplete  Culture, blood (Routine X 2) w Reflex to ID Panel     Status: None (Preliminary result)   Collection Time: 08/06/17 11:41 AM  Result Value Ref Range Status   Specimen Description BLOOD RIGHT HAND  Final   Special Requests   Final    BOTTLES DRAWN AEROBIC AND ANAEROBIC Blood Culture adequate volume   Culture NO GROWTH 3 DAYS  Final   Report Status PENDING  Incomplete         Radiology Studies: Dg Chest Port 1 View  Result Date: 08/10/2017 CLINICAL DATA:  Sepsis EXAM: PORTABLE CHEST 1 VIEW COMPARISON:  08/06/2017 FINDINGS: Cardiac shadow remains enlarged. Tracheostomy tube remains in place in satisfactory position. Left basilar infiltrate is again identified and stable. Small left pleural effusion is seen. The right lung remains clear. Postsurgical changes in the right ribcage are again noted and stable. IMPRESSION: Stable left basilar changes.  No acute abnormality is noted. Electronically Signed   By: Inez Catalina M.D.   On: 08/10/2017 07:06        Scheduled Meds: . atorvastatin  10 mg Per Tube QHS  . baclofen  5 mg Per Tube BID  . chlorhexidine gluconate (MEDLINE KIT)  15 mL Mouth Rinse BID  . diltiazem  60 mg Per Tube Q6H  . free water  350 mL Per Tube Q4H  . heparin injection (subcutaneous)  5,000 Units Subcutaneous Q8H  . levothyroxine  125 mcg Per Tube QAC breakfast  . mouth rinse  15 mL Mouth Rinse QID  . metoprolol tartrate  12.5 mg Per Tube BID  . pantoprazole sodium  40 mg Per Tube Daily  . senna-docusate  1 tablet Per Tube QHS  . simethicone  40 mg Per Tube QID  . Warfarin - Pharmacist Dosing Inpatient   Does not apply q1800   Continuous Infusions: . anidulafungin Stopped (08/10/17 0532)  . cefTAZidime (FORTAZ)  IV Stopped (08/10/17 0347)  . dextrose 5 % with KCl 20 mEq / L 20 mEq (08/10/17 0600)  . feeding supplement (VITAL AF 1.2 CAL) 1,000 mL (08/10/17 0604)     LOS: 9 days    Time  spent: 40 minutes    Journey Castonguay, Geraldo Docker, MD [facility]  Pager [phone]   If 7PM-7AM, please contact night-coverage [email or web address] Password [email or web address] 08/10/2017, 7:30 AM

## 2017-08-11 DIAGNOSIS — E870 Hyperosmolality and hypernatremia: Secondary | ICD-10-CM

## 2017-08-11 DIAGNOSIS — E876 Hypokalemia: Secondary | ICD-10-CM

## 2017-08-11 DIAGNOSIS — I69391 Dysphagia following cerebral infarction: Secondary | ICD-10-CM

## 2017-08-11 LAB — COMPREHENSIVE METABOLIC PANEL
ALK PHOS: 209 U/L — AB (ref 38–126)
ALT: 23 U/L (ref 17–63)
AST: 17 U/L (ref 15–41)
Albumin: 1.9 g/dL — ABNORMAL LOW (ref 3.5–5.0)
Anion gap: 9 (ref 5–15)
BUN: 29 mg/dL — AB (ref 6–20)
CALCIUM: 8.2 mg/dL — AB (ref 8.9–10.3)
CHLORIDE: 95 mmol/L — AB (ref 101–111)
CO2: 29 mmol/L (ref 22–32)
CREATININE: 0.7 mg/dL (ref 0.61–1.24)
Glucose, Bld: 108 mg/dL — ABNORMAL HIGH (ref 65–99)
Potassium: 3.8 mmol/L (ref 3.5–5.1)
SODIUM: 133 mmol/L — AB (ref 135–145)
Total Bilirubin: 0.4 mg/dL (ref 0.3–1.2)
Total Protein: 8.1 g/dL (ref 6.5–8.1)

## 2017-08-11 LAB — CULTURE, BLOOD (ROUTINE X 2)
CULTURE: NO GROWTH
CULTURE: NO GROWTH
Special Requests: ADEQUATE

## 2017-08-11 LAB — GLUCOSE, CAPILLARY
GLUCOSE-CAPILLARY: 106 mg/dL — AB (ref 65–99)
GLUCOSE-CAPILLARY: 105 mg/dL — AB (ref 65–99)
GLUCOSE-CAPILLARY: 96 mg/dL (ref 65–99)
Glucose-Capillary: 104 mg/dL — ABNORMAL HIGH (ref 65–99)
Glucose-Capillary: 108 mg/dL — ABNORMAL HIGH (ref 65–99)
Glucose-Capillary: 96 mg/dL (ref 65–99)

## 2017-08-11 LAB — MAGNESIUM: Magnesium: 1.4 mg/dL — ABNORMAL LOW (ref 1.7–2.4)

## 2017-08-11 LAB — PROTIME-INR
INR: 1.07
PROTHROMBIN TIME: 13.9 s (ref 11.4–15.2)

## 2017-08-11 LAB — PHOSPHORUS: PHOSPHORUS: 2.5 mg/dL (ref 2.5–4.6)

## 2017-08-11 MED ORDER — GLYCOPYRROLATE 0.2 MG/ML IJ SOLN
0.1000 mg | Freq: Two times a day (BID) | INTRAMUSCULAR | Status: DC
  Administered 2017-08-11 – 2017-08-14 (×7): 0.1 mg via INTRAVENOUS
  Filled 2017-08-11: qty 0.5
  Filled 2017-08-11 (×2): qty 1
  Filled 2017-08-11: qty 0.5
  Filled 2017-08-11 (×4): qty 1

## 2017-08-11 MED ORDER — DEXTROSE-NACL 5-0.9 % IV SOLN
INTRAVENOUS | Status: DC
  Administered 2017-08-11: 22:00:00 via INTRAVENOUS

## 2017-08-11 MED ORDER — COUMADIN BOOK
Freq: Once | Status: AC
  Administered 2017-08-11: 09:00:00
  Filled 2017-08-11: qty 1

## 2017-08-11 MED ORDER — MAGNESIUM SULFATE 2 GM/50ML IV SOLN
2.0000 g | Freq: Once | INTRAVENOUS | Status: AC
  Administered 2017-08-11: 2 g via INTRAVENOUS
  Filled 2017-08-11: qty 50

## 2017-08-11 MED ORDER — WARFARIN SODIUM 5 MG PO TABS
5.0000 mg | ORAL_TABLET | Freq: Once | ORAL | Status: AC
  Administered 2017-08-11: 5 mg via ORAL
  Filled 2017-08-11 (×2): qty 1

## 2017-08-11 NOTE — Progress Notes (Signed)
ANTICOAGULATION CONSULT NOTE  Pharmacy Consult:  Coumadin Indication: atrial fibrillation  No Known Allergies  Patient Measurements: Height: 5\' 11"  (180.3 cm) Weight: 172 lb 13.5 oz (78.4 kg) IBW/kg (Calculated) : 75.3  Vital Signs: Temp: 98.3 F (36.8 C) (12/06 0807) Temp Source: Oral (12/06 0807) BP: 94/46 (12/06 0700) Pulse Rate: 54 (12/06 0700)  Labs: Recent Labs    08/10/17 0302 08/11/17 0257  HGB 10.0*  --   HCT 33.3*  --   PLT 249  --   LABPROT 13.8 13.9  INR 1.07 1.07  CREATININE 0.73  --     Estimated Creatinine Clearance: 99.4 mL/min (by C-G formula based on SCr of 0.73 mg/dL).   Assessment: [age] YOM with PAF not on anticoagulation at home likely due to history of right renal collecting system hemorrhage which has resolved.  Pharmacy consulted to manage warfarin starting 08/09/17.  INR is sub-therapeutic as expected.  No bleeding reported.   Goal of Therapy:  INR 2-3 Monitor platelets by anticoagulation protocol: Yes    Plan:  Repeat Coumadin 5mg  PO today, may need to increase dose in AM Continue heparin SQ until INR therapeutic Daily PT / INR Coumadin book   Brysun Eschmann D. Laney Potashang, PharmD, BCPS Pager:  [phone] 08/11/2017, 8:43 AM

## 2017-08-11 NOTE — Progress Notes (Signed)
CSW received call from El[place]Stacey at [facility] seeking a discharge date for pt. CSW reached out to doctor and was informed that at this time there is no set discharge for pt as pt is expected to have further evaluations in the future. CSW left VM updating Misty StanleyStacey of this at this time.       Craig MangesKierra S. Tensley Oneal, MSW, LCSW-A Emergency Department Clinical Social Worker [phone]

## 2017-08-11 NOTE — Progress Notes (Signed)
PULMONARY / CRITICAL CARE MEDICINE   Name: Craig DiegoCharlie J Joaquin Jr. MRN: [id number] DOB: Nov 16, 1950    ADMISSION DATE:  08/01/2017 CONSULTATION DATE:  08/01/2017  REFERRING MD:  Dr. 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 Blood cultures sent and started on cefepime 11/26 and amikacin 11/27.  Additionally, daughter reports patient normally requires ventilator at night normally but has required continously for the last two days.    [facility]; [facility]  [facility] with proteus bacteremia, septic and hemorrhagic shock.  Additionally has history of serratia and pseudomonas from tracheal aspirate in July, both sensitive to Cefepime, as well as enterobacter and morganella.  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    In ER, initial labs noted for K 6.5, BUN 213, sCr 4.74 (   ), WBC 17, Hgb 8.3, BNP 316, lactic acid 1.2 -> 0.82,  EKG with afib rate 132, CXR with bilateral infiltrates versus edema.  Normotensive in ER.  Treated with sepsis protocol with 2L NS bolus, vancomycin and cefepime given after blood cultures, and lopressor once for HR control.  Patient admitted for further evaluation.     SUBJECTIVE:  No events overnight, went on the vent overnight  VITAL SIGNS: BP (!) 95/55   Pulse (!) 112   Temp 98.3 F (36.8 C) (Oral)   Resp (!) 25   Ht 5\' 11"  (1.803 m)   Wt 78.4 kg (172 lb 13.5 oz)   SpO2 100%   BMI 24.11 kg/m   HEMODYNAMICS:    VENTILATOR SETTINGS: Vent Mode: Stand-by FiO2 (%):  [28 %-40 %] 40 % Set Rate:   [16 bmp] 16 bmp Vt Set:  [500 mL] 500 mL PEEP:  [5 cmH20] 5 cmH20 Plateau Pressure:  [17 cmH20-20 cmH20] 20 cmH20  INTAKE / OUTPUT: I/O last 3 completed shifts: In: 327575 [I.V.:2775; NG/GT:4340; IV Piggyback:460] Out: 2596 [Urine:2595; Stool:1]  PHYSICAL EXAMINATION: General: Chronically ill appearing male, NAD HEENT: /AT, PERRL, EOM-I and MMM Neuro: Awake and interactive, moving all ext to command CV: RRR, Nl S1/S2, -M/R/G. PULM: Increased secretion from trach GI: Soft, NT, ND and +BS Extremities: warm/dry, no edema, R AKA Skin: no rashes or lesions  LABS:  BMET Recent Labs  Lab 08/08/17 0404 08/10/17 0302 08/11/17 0816  NA 149* 140 133*  K 3.7 3.8 3.8  CL 116* 100* 95*  CO2 27 30 29   BUN 28* 26* 29*  CREATININE 0.96 0.73 0.70  GLUCOSE 109* 111* 108*   Electrolytes Recent Labs  Lab 08/04/17 1606  08/05/17 0533 08/06/17 0533  08/07/17 1246 08/08/17 0404 08/10/17 0302 08/11/17 0816  CALCIUM  --    < > 8.3* 8.0*   < > 7.8* 7.9* 8.1* 8.2*  MG 1.7  --  1.7 1.4*  --  1.8  --   --  1.4*  PHOS 2.9  --  2.3*  --   --   --   --   --  2.5   < > = values in this interval not displayed.   CBC Recent Labs  Lab  08/07/17 0705 08/08/17 0404 08/10/17 0302  WBC 13.2* 12.7* 13.1*  HGB 9.0* 8.9* 10.0*  HCT 30.2* 30.7* 33.3*  PLT 228 245 249   Coag's Recent Labs  Lab 08/07/17 0508 08/10/17 0302 08/11/17 0257  INR 1.43 1.07 1.07   Sepsis Markers No results for input(s): LATICACIDVEN, PROCALCITON, O2SATVEN in the last 168 hours. ABG No results for input(s): PHART, PCO2ART, PO2ART in the last 168 hours. Liver Enzymes Recent Labs  Lab 08/10/17 0302 08/11/17 0816  AST 24 17  ALT 33 23  ALKPHOS 226* 209*  BILITOT 0.3 0.4  ALBUMIN 1.9* 1.9*   Cardiac Enzymes No results for input(s): TROPONINI, PROBNP in the last 168 hours.  Glucose Recent Labs  Lab 08/10/17 1157 08/10/17 1604 08/10/17 1944 08/10/17 2352 08/11/17 0424 08/11/17 0804  GLUCAP 115*  105* 109* 106* 106* 108*   Imaging No results found. STUDIES:  11/26 CXR >> bilateral infiltrates vs edema 11/27 CXR >> stable bilateral opacities TTE >> obtained 11/27 >> Mild increased left ventricular hypertrophy.  Systolic function was mildly reduced at 40-45% with global hypokinesis and inferior basal akinesis.  The right ventricle was mildly dilated but systolic function normal.  The right atrium was mildly dilated there is moderate tricuspid valve regurg.  There are no echocardiograms on file to compare CT Chest / Abd 11/27 >> Percutaneous gastrostomy tube retracted into the abdominal wall.  Radiologist reports what appears to be mature tract to the gastric lumen given oral contrast injected through catheter did not extravasate.  There is no pneumoperitoneum or fluid collection.  Advanced bilateral hydronephrosis with upper hydroureter.  High density in the right renal collecting system consistent with hemorrhage.  Left greater than right lower lobe airspace disease.  CULTURES: 11/26 BCx 2 >> coag neg staph, candida glabrata  11/26 trach aspirate >> pseudomonas aeruginosa >> S- ceftaz, cipro, zosyn 11/26 UC >> 30k yeast 11/26: blood culture RID: + MRSA  ANTIBIOTICS: ? Prior Amikacin >>11/27 11/26 Cefepime (at [facility]) >> 11/27 11/26 Vancomycin >>11/27 Linezolid 11/27 >> 11/19 Ceftaz/avibactam 11/27 >> 12/1 Anidulofungin 11/30 >> 12/1  Ceftaz 12/1 >>  SIGNIFICANT EVENTS: 11/26 Admit  LINES/TUBES: R PICC from [facility] -  Gastrostomy tube >> Foley>> changed 11/26 >>  I reviewed CXR myself, trach is in good position.  DISCUSSION:  66 year old male with PMH above presenting to P[facility]M from [facility] with AMS and fever.  Patient was admitted to the ICU for resuscitation.  Patient was resuscitated and improved.  On exam, lungs are clear, trach is in good position.  I reviewed CXR myself, trach is in good position.  Discussed with PCCM-NP.  Chronic respiratory failure:  - ATC  during the day  - Attempt TC 24/7  Hypoxemia:  - Titrate O2 for sat of 88-92%  Trach status:  - Maintain current trach size and type  Increased secretion:  - Lasix as ordered  PCCM will continue to follow, may transfer to SDU vent bed.  Patient seen and examined, agree with above note.  I dictated the care and orders written for this patient under my direction.  Alyson ReedyYacoub, Florinda Taflinger G, MD [phone]

## 2017-08-11 NOTE — Progress Notes (Signed)
Pt found on 30% venturi set up through trach collar. Changed to 35% ATC.

## 2017-08-11 NOTE — Progress Notes (Signed)
Pt taken off vent and placed on 40% ATC.  Pt is tolerating well.  Sats 100%, HR 74, RR 25.   RT will continue to monitor.

## 2017-08-11 NOTE — Progress Notes (Signed)
PROGRESS NOTE    Craig Oneal.  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 Blood cultures sent and started on cefepime 11/26 and amikacin 11/27.  Additionally, daughter reports patient normally requires ventilator at night normally but has required continously for the last two days.     Patient has been at [facility] since February 2018; prior to was at [facility].  Hospitalized at [facility] in 01/2017 with proteus bacteremia, septic and hemorrhagic shock.  Additionally has history of serratia and pseudomonas from tracheal aspirate in July, both sensitive to Cefepime, as well as enterobacter and morganella.  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      Subjective: 12/6 eyes open, patient alert, nods yes and no appropriately to questions, mouths answers appropriately questions. Negative CP, positive slight SOB (secondary to copious secretions), negative abdominal pain. Quadriplegia.    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   -If repeat blood cultures negative will have PICC line placed on 12/6. Continue antifungal/antibiotics per ID.   Chronic systolic CHF -Strict in and out since admission +15.4 L -Daily weight Filed Weights   08/09/17 0333 08/10/17 0300 08/11/17 0426  Weight: 170 lb 3.1 oz (77.2 kg) 170 lb 3.1 oz (77.2 kg) 172 lb 13.5 oz (78.4 kg)  -Cardizem 60 mg QID -Transfuse for hemoglobin <8  Pulmonary HTN -See CHF  Chronic Atrial Fibrillation -Prior to admission was on warfarin initially held. . -Warfarin restarted on 12/4 per pharmacy   Acute on CKD stage III  -Patient was chronic Foley -RIGHT renal collecting system hemorrhage -Secondary to bilateral hydronephrosis, bacteremia. Per urology note appears   -Per urology believed source of bacteremia (Candida Glabrata from GU tract specifically RIGHT kidney).  -12/2 per Dr. Irine Seal Urology noted after further evaluation no requirement for RIGHT Nephrostomy tube placement.  -Restart tube feeds  Hypernatremia -Resolved: Mildly hyponatremic: Change maintenance fluid to D5-0.9% saline  Hypokalemia  -Potassium goal > 4  Hypomagnesemia -Magnesium goal> 2 -Magnesium IV 2 g  Chronic foley  Cholecystitis? -Per renal ultrasound patient with  possible cholecystitis given patient's quadriplegia and inability to  communicate will obtain RUQ abdominal ultrasound per radiology recommendation -Abdominal ultrasound inconclusive for cholecystitis. Patient clinically improving, continue to monitor liver enzymes and WBC.  Dysphagia as late effect of CVA -NPO -Gtube-->malpositioned > replaced by IR -Transaminitis - appears chronic based on May 2018 labs -12/2 Restart vital AF 1.2 CAL 70 ml/hr -12/6 placed consult to speech for cognitive language evaluation.   Acute blood loss anemia  -presumably from area noted on CT scan from right renal collecting system noting concern for hemorrhage -11/27 transfused 2 units PRBC -12/1 transfuse 1 unit PRBC  -Transfuse for hemoglobin<8 Recent Labs  Lab 08/05/17 0533 08/06/17 0533 08/07/17 0705 08/08/17 0404 08/10/17 0302  HGB 7.2* 7.4* 9.0* 8.9* 10.0*      Hypothyroidism  -Synthroid 125 g daily   Acute encephalopathy - Infection/sepsis ? -Hx cerebral atrophy, paraplegia w/ataxia -Improved, patient not having yes and no to questions appropriately.  Right AKA -Review of EMR does not show exact date of surgery or who performed surgery.  -Patient states had surgery 12 moments ago. -Upon exam today staples have been removed.     DVT prophylaxis: Warfarin per pharmacy Code Status: Full Family Communication: None Disposition Plan: TBD   Consultants:  [facility]rology ID     Procedures/Significant Events:   STUDIES:  11/26 CXR >> bilateral infiltrates vs edema 11/27 CXR >> stable bilateral opacities 11/27 Echocardiogram :  LVEF = 40-45% with global hypokinesis and inferior basal akinesis.   -RIGHT [facility]: moderate tricuspid valve regurg.  -Tricuspid valve: Moderate regurgitation -Pulmonary arteries PA peak pressure 51 mmHg   11/27 CT chest and abdomen : Percutaneous gastrostomy tube retracted into the abdominal wall.  Radiologist reports what appears to be mature tract to the gastric lumen given oral contrast injected through catheter did not  extravasate.  There is no pneumoperitoneum or fluid collection.  Advanced bilateral hydronephrosis with upper hydroureter.  High density in the right renal collecting system consistent with hemorrhage.  Left greater than right lower lobe airspace disease. 11/29 CT abdomen pelvis W contrast:-Gastrostomy tube appears be in good position w/ocomplicating features. - Small bilateral pleural effusions and bibasilar atelectasis. -Cholelithiasis without definite CT findings for acute cholecystitis. - Right-sided hydroureteronephrosis without obvious cause.  12/1 Renal ultrasound:-Cholelithiasis. -Gallbladder wall is thickened to 5 mm. Dedicated ultrasound of the gallbladder and/or a HIDA scan may be helpful if there is concern for acute cholecystitis. -Previously identified right-sided hydronephrosis has almost completely resolved.  -Left renal cysts, incompletely characterized. 12/2 RUQ abdominal ultrasound:- A few small stones and some sludge are seen in the gallbladder with wall thickening. -No pericholecystic fluid.  -A Murphy's sign cannot be assessed as the patient is unresponsive.  -A HIDA scan could further evaluate for cystic duct patency/acute cholecystitis as clinically warranted. -The common bile duct measures 6 mm which is borderline with no significant dilatation.        I have personally reviewed and interpreted all radiology studies and my findings are as above.  VENTILATOR SETTINGS: Trach collar FiO2: 40%   Cultures Prior cultures at [facility] 11/23 ? 11/26 blood 2/2 positive coag negative staph  11/26 blood positive Candida Glabrata 11/26 Trach Aspirate: Positive Pseudomonas Aeruginosa  11/26 UC positive 30,000 colonies yeast 11/27 MRSA by PCR negative  11/28 blood positive yeast 12/1 blood pending   Antimicrobials: Anti-infectives (From admission, onward)   Start     Stop   08/06/17 2200  ceftazidime-avibactam (AVYCAZ) 2.5 g in dextrose 5 %  50 mL IVPB  Status:   Discontinued     08/06/17 1547   08/06/17 1700  cefTAZidime (FORTAZ) 2 g in dextrose 5 % 50 mL IVPB     08/14/17 2359   08/06/17 0400  anidulafungin (ERAXIS) 100 mg in sodium chloride 0.9 % 100 mL IVPB         08/05/17 0400  anidulafungin (ERAXIS) 200 mg in sodium chloride 0.9 % 200 mL IVPB     08/05/17 0838   08/04/17 1400  ceftazidime-avibactam (AVYCAZ) 1.25 g in dextrose 5 % 50 mL IVPB  Status:  Discontinued     08/06/17 1431   08/02/17 2100  vancomycin (VANCOCIN) IVPB 750 mg/150 ml premix  Status:  Discontinued     08/02/17 1630   08/02/17 2100  ceFEPIme (MAXIPIME) 1 g in dextrose 5 % 50 mL IVPB  Status:  Discontinued     08/02/17 1501   08/02/17 1800  linezolid (ZYVOX) IVPB 600 mg  Status:  Discontinued     08/04/17 1452   08/02/17 1600  ceftazidime-avibactam (AVYCAZ) 0.94 g in dextrose 5 % 50 mL IVPB  Status:  Discontinued     08/04/17 0838   08/01/17 1830  ceFEPIme (MAXIPIME) 2 g in dextrose 5 % 50 mL IVPB     08/01/17 2045   08/01/17 1745  vancomycin (VANCOCIN) 1,500 mg in sodium chloride 0.9 % 500 mL IVPB     08/01/17 2242   08/01/17 1730  piperacillin-tazobactam (ZOSYN) IVPB 3.375 g  Status:  Discontinued     08/01/17 1818   08/01/17 1730  vancomycin (VANCOCIN) IVPB 1000 mg/200 mL premix  Status:  Discontinued     08/01/17 1739       Devices   LINES / TUBES:  RIGHT PICC from [facility] -  Gastrostomy tube   Foley>> changed 11/26 >>  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 anidulafungin Stopped (08/11/17 0552)  . cefTAZidime (FORTAZ)  IV 2 g (08/11/17 1000)  . dextrose 5 % with KCl 20 mEq / L 20 mEq (08/11/17 1013)  . feeding supplement (VITAL AF 1.2 CAL) 1,000 mL (08/11/17 0600)     Objective: Vitals:   08/11/17 0800 08/11/17 0807 08/11/17 0900 08/11/17 1000  BP: (!) 96/47  103/67 (!) 95/55  Pulse: 69  (!) 29 (!) 112  Resp: (!) 34  (!) 25 (!) 25  Temp:  98.3 F (36.8 C)    TempSrc:  Oral    SpO2: 100%  96% 100%  Weight:      Height:         Intake/Output Summary (Last 24 hours) at 08/11/2017 1031 Last data filed at 08/11/2017 1000 Gross per 24 hour  Intake 4540 ml  Output 2361 ml  Net 2179 ml   Filed Weights   08/09/17 0333 08/10/17 0300 08/11/17 0426  Weight: 170 lb 3.1 oz (77.2 kg) 170 lb 3.1 oz (77.2 kg) 172 lb 13.5 oz (78.4 kg)    Physical Exam:  General: Eyes open, alert, nods yes and no appropriately to questions, positive acute respiratory distress (currently on trach collar)  Neck:  Negative scars, masses, torticollis, lymphadenopathy, JVD, #7 cuffed trach in place covered and clean, negative sign of infection positive copious clear secretions Lungs: positive mild bilateral upper lobe rhonchi, negative wheezes, negative crackles  Cardiovascular: Regular rate and rhythm without murmur gallop or rub normal S1 and S2 Abdomen: negative abdominal pain, nondistended, positive soft, bowel sounds, no rebound, no ascites, no appreciable mass, PEG  tube in place, covered and clean negative sign of infection Extremities: No significant cyanosis, clubbing, or edema LLE. RIGHT AKA staples removed healing well, Infection, negative discharge  Skin: Negative rashes, lesions, ulcers Psychiatric:  Unable to assess secondary to patient being trached.  Central nervous system:  Quadriplegia, sensation intact grimaces to painful stimuli. Negative receptive aphasia nods yes and no to simple questions. Mouth answers to more complex questions.  .     Data Reviewed: Care during the described time interval was provided by me .  I have reviewed this patient's available data, including medical history, events of note, physical examination, and all test results as part of my evaluation.   CBC: Recent Labs  Lab 08/05/17 0533 08/06/17 0533 08/07/17 0705 08/08/17 0404 08/10/17 0302  WBC 10.2 11.2* 13.2* 12.7* 13.1*  NEUTROABS  --   --  9.5* 9.3*  --   HGB 7.2* 7.4* 9.0* 8.9* 10.0*  HCT 24.6* 25.4* 30.2* 30.7* 33.3*  MCV 79.6 80.4  81.0 83.0 82.6  PLT 181 184 228 245 156   Basic Metabolic Panel: Recent Labs  Lab 08/04/17 1606  08/05/17 0533 08/06/17 0533 08/07/17 0508 08/07/17 1246 08/08/17 0404 08/10/17 0302 08/11/17 0816  NA  --    < > 147* 148* 149* 148* 149* 140 133*  K  --    < > 3.3* 3.3* 2.3* 4.1 3.7 3.8 3.8  CL  --    < > 118* 116* 128* 115* 116* 100* 95*  CO2  --    < > 23 25 17* 27 27 30 29   GLUCOSE  --    < > 107* 109* 71 92 109* 111* 108*  BUN  --    < > 63* 43* 21* 31* 28* 26* 29*  CREATININE  --    < > 1.66* 1.28* 0.64 0.97 0.96 0.73 0.70  CALCIUM  --    < > 8.3* 8.0* 4.7* 7.8* 7.9* 8.1* 8.2*  MG 1.7  --  1.7 1.4*  --  1.8  --   --  1.4*  PHOS 2.9  --  2.3*  --   --   --   --   --  2.5   < > = values in this interval not displayed.   GFR: Estimated Creatinine Clearance: 99.4 mL/min (by C-G formula based on SCr of 0.7 mg/dL). Liver Function Tests: Recent Labs  Lab 08/10/17 0302 08/11/17 0816  AST 24 17  ALT 33 23  ALKPHOS 226* 209*  BILITOT 0.3 0.4  PROT 8.3* 8.1  ALBUMIN 1.9* 1.9*   No results for input(s): LIPASE, AMYLASE in the last 168 hours. No results for input(s): AMMONIA in the last 168 hours. Coagulation Profile: Recent Labs  Lab 08/07/17 0508 08/10/17 0302 08/11/17 0257  INR 1.43 1.07 1.07   Cardiac Enzymes: No results for input(s): CKTOTAL, CKMB, CKMBINDEX, TROPONINI in the last 168 hours. BNP (last 3 results) No results for input(s): PROBNP in the last 8760 hours. HbA1C: No results for input(s): HGBA1C in the last 72 hours. CBG: Recent Labs  Lab 08/10/17 1604 08/10/17 1944 08/10/17 2352 08/11/17 0424 08/11/17 0804  GLUCAP 105* 109* 106* 106* 108*   Lipid Profile: No results for input(s): CHOL, HDL, LDLCALC, TRIG, CHOLHDL, LDLDIRECT in the last 72 hours. Thyroid Function Tests: No results for input(s): TSH, T4TOTAL, FREET4, T3FREE, THYROIDAB in the last 72 hours. Anemia Panel: No results for input(s): VITAMINB12, FOLATE, FERRITIN, TIBC, IRON,  RETICCTPCT in the last 72 hours. Urine analysis:  Component Value Date/Time   COLORURINE YELLOW 08/01/2017 2051   APPEARANCEUR CLOUDY (A) 08/01/2017 2051   LABSPEC 1.020 08/01/2017 2051   PHURINE 5.5 08/01/2017 2051   GLUCOSEU NEGATIVE 08/01/2017 2051   HGBUR LARGE (A) 08/01/2017 2051   BILIRUBINUR NEGATIVE 08/01/2017 2051   [place] 08/01/2017 2051   PROTEINUR 100 (A) 08/01/2017 2051   NITRITE NEGATIVE 08/01/2017 2051   LEUKOCYTESUR MODERATE (A) 08/01/2017 2051   Sepsis Labs: @LABRCNTIP (procalcitonin:4,lacticidven:4)  ) Recent Results (from the past 240 hour(s))  Blood Culture (routine x 2)     Status: Abnormal   Collection Time: 08/01/17  5:22 PM  Result Value Ref Range Status   Specimen Description BLOOD LEFT HAND  Final   Special Requests   Final    Blood Culture adequate volume BOTTLES DRAWN AEROBIC AND ANAEROBIC   Culture  Setup Time   Final    GRAM POSITIVE COCCI IN CLUSTERS ANAEROBIC BOTTLE ONLY CRITICAL RESULT CALLED TO, READ BACK BY AND VERIFIED WITH: PHARMD G ABBOTT [id number] MLM YEAST AEROBIC BOTTLE ONLY CRITICAL RESULT CALLED TO, READ BACK BY AND VERIFIED WITH: K WEIGLE PHARMD 0330 08/05/17 A BROWNING    Culture (A)  Final    STAPHYLOCOCCUS SPECIES (COAGULASE NEGATIVE) THE SIGNIFICANCE OF ISOLATING THIS ORGANISM FROM A SINGLE SET OF BLOOD CULTURES WHEN MULTIPLE SETS ARE DRAWN IS UNCERTAIN. PLEASE NOTIFY THE MICROBIOLOGY DEPARTMENT WITHIN ONE WEEK IF SPECIATION AND SENSITIVITIES ARE REQUIRED. CANDIDA GLABRATA CORRECTED ON 12/01 AT 0840: PREVIOUSLY REPORTED AS YEAST    Report Status 08/07/2017 FINAL  Final  Blood Culture ID Panel (Reflexed)     Status: Abnormal   Collection Time: 08/01/17  5:22 PM  Result Value Ref Range Status   Enterococcus species NOT DETECTED NOT DETECTED Final   Listeria monocytogenes NOT DETECTED NOT DETECTED Final   Staphylococcus species DETECTED (A) NOT DETECTED Final    Comment: Methicillin (oxacillin) resistant  coagulase negative staphylococcus. Possible blood culture contaminant (unless isolated from more than one blood culture draw or clinical case suggests pathogenicity). No antibiotic treatment is indicated for blood  culture contaminants. CRITICAL RESULT CALLED TO, READ BACK BY AND VERIFIED WITH: PHARMD G ABBOTT [phone] MLM    Staphylococcus aureus NOT DETECTED NOT DETECTED Final   Methicillin resistance DETECTED (A) NOT DETECTED Final    Comment: CRITICAL RESULT CALLED TO, READ BACK BY AND VERIFIED WITH: PHARMD G ABBOTT [id number] MLM    Streptococcus species NOT DETECTED NOT DETECTED Final   Streptococcus agalactiae NOT DETECTED NOT DETECTED Final   Streptococcus pneumoniae NOT DETECTED NOT DETECTED Final   Streptococcus pyogenes NOT DETECTED NOT DETECTED Final   Acinetobacter baumannii NOT DETECTED NOT DETECTED Final   Enterobacteriaceae species NOT DETECTED NOT DETECTED Final   Enterobacter cloacae complex NOT DETECTED NOT DETECTED Final   Escherichia coli NOT DETECTED NOT DETECTED Final   Klebsiella oxytoca NOT DETECTED NOT DETECTED Final   Klebsiella pneumoniae NOT DETECTED NOT DETECTED Final   Proteus species NOT DETECTED NOT DETECTED Final   Serratia marcescens NOT DETECTED NOT DETECTED Final   Haemophilus influenzae NOT DETECTED NOT DETECTED Final   Neisseria meningitidis NOT DETECTED NOT DETECTED Final   Pseudomonas aeruginosa NOT DETECTED NOT DETECTED Final   Candida albicans NOT DETECTED NOT DETECTED Final   Candida glabrata NOT DETECTED NOT DETECTED Final   Candida krusei NOT DETECTED NOT DETECTED Final   Candida parapsilosis NOT DETECTED NOT DETECTED Final   Candida tropicalis NOT DETECTED NOT DETECTED Final  Blood Culture ID Panel (  Reflexed)     Status: Abnormal   Collection Time: 08/01/17  5:22 PM  Result Value Ref Range Status   Enterococcus species NOT DETECTED NOT DETECTED Final   Listeria monocytogenes NOT DETECTED NOT DETECTED Final   Staphylococcus species  NOT DETECTED NOT DETECTED Final   Staphylococcus aureus NOT DETECTED NOT DETECTED Final   Streptococcus species NOT DETECTED NOT DETECTED Final   Streptococcus agalactiae NOT DETECTED NOT DETECTED Final   Streptococcus pneumoniae NOT DETECTED NOT DETECTED Final   Streptococcus pyogenes NOT DETECTED NOT DETECTED Final   Acinetobacter baumannii NOT DETECTED NOT DETECTED Final   Enterobacteriaceae species NOT DETECTED NOT DETECTED Final   Enterobacter cloacae complex NOT DETECTED NOT DETECTED Final   Escherichia coli NOT DETECTED NOT DETECTED Final   Klebsiella oxytoca NOT DETECTED NOT DETECTED Final   Klebsiella pneumoniae NOT DETECTED NOT DETECTED Final   Proteus species NOT DETECTED NOT DETECTED Final   Serratia marcescens NOT DETECTED NOT DETECTED Final   Haemophilus influenzae NOT DETECTED NOT DETECTED Final   Neisseria meningitidis NOT DETECTED NOT DETECTED Final   Pseudomonas aeruginosa NOT DETECTED NOT DETECTED Final   Candida albicans NOT DETECTED NOT DETECTED Final   Candida glabrata DETECTED (A) NOT DETECTED Final    Comment: CRITICAL RESULT CALLED TO, READ BACK BY AND VERIFIED WITH: K WEIGLE PHARMD 0330 08/05/17 A BROWNING    Candida krusei NOT DETECTED NOT DETECTED Final   Candida parapsilosis NOT DETECTED NOT DETECTED Final   Candida tropicalis NOT DETECTED NOT DETECTED Final  Blood Culture (routine x 2)     Status: None   Collection Time: 08/01/17  7:05 PM  Result Value Ref Range Status   Specimen Description BLOOD RIGHT PICC LINE  Final   Special Requests IN PEDIATRIC BOTTLE Blood Culture adequate volume  Final   Culture NO GROWTH 5 DAYS  Final   Report Status 08/06/2017 FINAL  Final  Culture, respiratory (tracheal aspirate)     Status: None   Collection Time: 08/01/17 11:56 PM  Result Value Ref Range Status   Specimen Description TRACHEAL ASPIRATE  Final   Special Requests NONE  Final   Gram Stain   Final    ABUNDANT WBC PRESENT,BOTH PMN AND MONONUCLEAR MODERATE  GRAM NEGATIVE RODS RARE GRAM POSITIVE RODS    Culture FEW PSEUDOMONAS AERUGINOSA  Final   Report Status 08/06/2017 FINAL  Final   Organism ID, Bacteria PSEUDOMONAS AERUGINOSA  Final      Susceptibility   Pseudomonas aeruginosa - MIC*    CEFTAZIDIME 4 SENSITIVE Sensitive     CIPROFLOXACIN <=0.25 SENSITIVE Sensitive     GENTAMICIN 8 INTERMEDIATE Intermediate     IMIPENEM >=16 RESISTANT Resistant     PIP/TAZO 16 SENSITIVE Sensitive     CEFEPIME 16 INTERMEDIATE Intermediate     * FEW PSEUDOMONAS AERUGINOSA  Urine culture     Status: Abnormal   Collection Time: 08/01/17 11:56 PM  Result Value Ref Range Status   Specimen Description URINE, CATHETERIZED  Final   Special Requests NONE  Final   Culture 30,000 COLONIES/mL YEAST (A)  Final   Report Status 08/03/2017 FINAL  Final  MRSA PCR Screening     Status: None   Collection Time: 08/02/17  1:05 AM  Result Value Ref Range Status   MRSA by PCR NEGATIVE NEGATIVE Final    Comment:        The GeneXpert MRSA Assay (FDA approved for NASAL specimens only), is one component of a comprehensive MRSA colonization surveillance  program. It is not intended to diagnose MRSA infection nor to guide or monitor treatment for MRSA infections.   Culture, blood (Routine X 2) w Reflex to ID Panel     Status: None   Collection Time: 08/03/17  3:16 PM  Result Value Ref Range Status   Specimen Description BLOOD LEFT HAND  Final   Special Requests IN PEDIATRIC BOTTLE Blood Culture adequate volume  Final   Culture NO GROWTH 5 DAYS  Final   Report Status 08/08/2017 FINAL  Final  Culture, blood (Routine X 2) w Reflex to ID Panel     Status: Abnormal   Collection Time: 08/03/17  3:28 PM  Result Value Ref Range Status   Specimen Description BLOOD LEFT HAND  Final   Special Requests IN PEDIATRIC BOTTLE Blood Culture adequate volume  Final   Culture  Setup Time   Final    YEAST IN PEDIATRIC BOTTLE CRITICAL VALUE NOTED.  VALUE IS CONSISTENT WITH PREVIOUSLY  REPORTED AND CALLED VALUE.    Culture CANDIDA ALBICANS (A)  Final   Report Status 08/07/2017 FINAL  Final  Culture, blood (Routine X 2) w Reflex to ID Panel     Status: None (Preliminary result)   Collection Time: 08/06/17 11:40 AM  Result Value Ref Range Status   Specimen Description BLOOD LEFT ANTECUBITAL  Final   Special Requests   Final    BOTTLES DRAWN AEROBIC AND ANAEROBIC Blood Culture results may not be optimal due to an excessive volume of blood received in culture bottles   Culture NO GROWTH 4 DAYS  Final   Report Status PENDING  Incomplete  Culture, blood (Routine X 2) w Reflex to ID Panel     Status: None (Preliminary result)   Collection Time: 08/06/17 11:41 AM  Result Value Ref Range Status   Specimen Description BLOOD RIGHT HAND  Final   Special Requests   Final    BOTTLES DRAWN AEROBIC AND ANAEROBIC Blood Culture adequate volume   Culture NO GROWTH 4 DAYS  Final   Report Status PENDING  Incomplete         Radiology Studies: Dg Chest Port 1 View  Result Date: 08/10/2017 CLINICAL DATA:  Sepsis EXAM: PORTABLE CHEST 1 VIEW COMPARISON:  08/06/2017 FINDINGS: Cardiac shadow remains enlarged. Tracheostomy tube remains in place in satisfactory position. Left basilar infiltrate is again identified and stable. Small left pleural effusion is seen. The right lung remains clear. Postsurgical changes in the right ribcage are again noted and stable. IMPRESSION: Stable left basilar changes.  No acute abnormality is noted. Electronically Signed   By: Inez Catalina M.D.   On: 08/10/2017 07:06        Scheduled Meds: . atorvastatin  10 mg Per Tube QHS  . baclofen  5 mg Per Tube BID  . chlorhexidine gluconate (MEDLINE KIT)  15 mL Mouth Rinse BID  . diltiazem  60 mg Per Tube Q6H  . free water  350 mL Per Tube Q4H  . heparin injection (subcutaneous)  5,000 Units Subcutaneous Q8H  . levothyroxine  125 mcg Per Tube QAC breakfast  . mouth rinse  15 mL Mouth Rinse QID  . metoprolol  tartrate  12.5 mg Per Tube BID  . pantoprazole sodium  40 mg Per Tube Daily  . senna-docusate  1 tablet Per Tube QHS  . simethicone  40 mg Per Tube QID  . warfarin  5 mg Oral ONCE-1800  . Warfarin - Pharmacist Dosing Inpatient   Does not apply  q1800   Continuous Infusions: . anidulafungin Stopped (08/11/17 0552)  . cefTAZidime (FORTAZ)  IV 2 g (08/11/17 1000)  . dextrose 5 % with KCl 20 mEq / L 20 mEq (08/11/17 1013)  . feeding supplement (VITAL AF 1.2 CAL) 1,000 mL (08/11/17 0600)     LOS: 10 days    Time spent: 40 minutes    WOODS, Geraldo Docker, MD [facility] Pager [phone]   If 7PM-7AM, please contact night-coverage [email or web address] Password [email or web address] 08/11/2017, 10:31 AM

## 2017-08-12 LAB — GLUCOSE, CAPILLARY
GLUCOSE-CAPILLARY: 113 mg/dL — AB (ref 65–99)
GLUCOSE-CAPILLARY: 87 mg/dL (ref 65–99)
Glucose-Capillary: 102 mg/dL — ABNORMAL HIGH (ref 65–99)
Glucose-Capillary: 107 mg/dL — ABNORMAL HIGH (ref 65–99)
Glucose-Capillary: 89 mg/dL (ref 65–99)
Glucose-Capillary: 97 mg/dL (ref 65–99)

## 2017-08-12 LAB — PROTIME-INR
INR: 1.08
PROTHROMBIN TIME: 13.9 s (ref 11.4–15.2)

## 2017-08-12 MED ORDER — WARFARIN SODIUM 7.5 MG PO TABS
7.5000 mg | ORAL_TABLET | Freq: Once | ORAL | Status: AC
  Administered 2017-08-12: 7.5 mg via ORAL
  Filled 2017-08-12: qty 1

## 2017-08-12 MED ORDER — FREE WATER
200.0000 mL | Freq: Four times a day (QID) | Status: DC
  Administered 2017-08-13 – 2017-08-15 (×11): 200 mL

## 2017-08-12 NOTE — Plan of Care (Addendum)
  Progressing Clinical Measurements: Ability to maintain clinical measurements within normal limits will improve 08/12/2017 0156 - Progressing by Della Gooummings, Jemeka Wagler R, RN Will remain free from infection 08/12/2017 0156 - Progressing by Della Gooummings, Tyaira Heward R, RN Diagnostic test results will improve 08/12/2017 0156 - Progressing by Della Gooummings, Daysy Santini R, RN Respiratory complications will improve 08/12/2017 0156 - Progressing by Della Gooummings, Norris Brumbach R, RN Cardiovascular complication will be avoided 08/12/2017 0156 - Progressing by Della Gooummings, Dayven Linsley R, RN Activity: Risk for activity intolerance will decrease 08/12/2017 0156 - Progressing by Della Gooummings, Robley Matassa R, RN Coping: Level of anxiety will decrease 08/12/2017 0156 - Progressing by Della Gooummings, Kimoni Pickerill R, RN Elimination: Will not experience complications related to bowel motility 08/12/2017 0156 - Progressing by Della Gooummings, Arzu Mcgaughey R, RN Will not experience complications related to urinary retention 08/12/2017 0156 - Progressing by Della Gooummings, Allena Pietila R, RN Pain Managment: General experience of comfort will improve 08/12/2017 0156 - Progressing by Della Gooummings, Shirlie Enck R, RN Safety: Ability to remain free from injury will improve 08/12/2017 0156 - Progressing by Della Gooummings, Emma Schupp R, RN Skin Integrity: Risk for impaired skin integrity will decrease 08/12/2017 0156 - Progressing by Della Gooummings, Delylah Stanczyk R, RN Education: Knowledge of General Education information will improve 08/12/2017 0156 - Not Progressing by Della Gooummings, Celso Granja R, RN

## 2017-08-12 NOTE — Progress Notes (Signed)
ANTICOAGULATION CONSULT NOTE  Pharmacy Consult:  Coumadin Indication: atrial fibrillation  No Known Allergies  Patient Measurements: Height: 5\' 11"  (180.3 cm) Weight: 171 lb 11.8 oz (77.9 kg) IBW/kg (Calculated) : 75.3  Assessment: 66 YOM who had Afib with RVR 11/26, hx PAF not on AC PTA. Hx of renal hemorrhage but has resolved. Coumadin started 12/5, also on heparin SQ. INR not trending up yet at 1.08 today. Hgb 10, plts wnl.  Goal of Therapy:  INR 2-3 Monitor platelets by anticoagulation protocol: Yes   Plan:  Give Coumadin 7.5mg  PO x 1 tonight Monitor daily INR, CBC, s/s of bleed Continue heparin SQ until INR therapeutic  Enzo BiNathan Lawerence Dery, PharmD, BCPS Clinical Pharmacist Pager [phone] 08/12/2017 8:41 AM

## 2017-08-12 NOTE — Evaluation (Signed)
[facility] Speaking Valve - Evaluation Patient Details  Name: Craig DiegoCharlie J Gillette Jr. MRN: [id number] Date of Birth: 04/13/1951  Today's Date: 08/12/2017 Time: 1034-1059 SLP Time Calculation (min) (ACUTE ONLY): 25 min  Past Medical History:  Past Medical History:  Diagnosis Date  . A-fib (HCC)   . Hx of AKA (above knee amputation), right (HCC)   . Tracheostomy in place [facility](HCC)    Past Surgical History:  Past Surgical History:  Procedure Laterality Date  . 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 Pt says that he has been working with SLP at [facility] and wears the PMV during waking hours.   Assessment / Plan / Recommendation Clinical Impression  Pt wore the PMV for almost 20 minutes when he started to have increasing RR. He did not have evidence of back pressure or any associated desaturations. Pt does sound audibly wet and needed Mod cues throughout evaluation for volitional cough to expectorate secretions with use of oral suction. Given the gradual rise in RR and his level of secretions, recommend wearing PMV with full supervision for now. Prognosis is likely good for return to use during waking hours as hsi secretions subside. SLP Visit Diagnosis: Aphonia (R49.1)    SLP Assessment  Patient needs continued Speech Lanaguage Pathology Services    Follow Up Recommendations  LTACH;Skilled Nursing facility    Frequency and Duration min 2x/week  2 weeks    PMSV Trial PMSV was placed for: 20 Able to redirect subglottic air through upper airway: Yes Able to Attain Phonation: Yes Voice Quality:  Wet Able to Expectorate Secretions: Yes Level of Secretion Expectoration with PMSV: Oral Breath Support for Phonation: Mildly decreased Intelligibility: Intelligibility reduced(dysarthric) Phrase: 50-74% accurate Sentence: 25-49% accurate Respirations During Trial: 25(increased to 35 by completion of eval) SpO2 During Trial: 95 % Pulse During Trial: 85 Behavior: Alert;Controlled;Cooperative;Responsive to questions   Tracheostomy Tube       Vent Dependency  FiO2 (%): 35 %    Cuff Deflation Trial  GO Tolerated Cuff Deflation: Yes Length of Time for Cuff Deflation Trial: 20+ Behavior: Alert;Cooperative        Maxcine Hamaiewonsky, Adetokunbo Mccadden 08/12/2017, 11:14 AM   Maxcine HamLaura Paiewonsky, M.A. CCC-SLP [phone]

## 2017-08-12 NOTE — Progress Notes (Signed)
Visited with patient.  Had prayer and short visit.  Phebe CollaDonna S Starr Urias, Chaplain   08/12/17 1600  Clinical Encounter Type  Visited With Patient  Visit Type Initial;Spiritual support  Referral From Nurse  Consult/Referral To Chaplain  Spiritual Encounters  Spiritual Needs Prayer  Stress Factors  Patient Stress Factors Other (Comment) (chart said anxious)  Family Stress Factors None identified

## 2017-08-12 NOTE — Evaluation (Signed)
Speech Language Pathology Evaluation Patient Details Name: Craig DiegoCharlie J Bayus Jr. MRN: [id number] DOB: [birth year] Today's Date: 08/12/2017 Time: [id number] SLP Time Calculation (min) (ACUTE ONLY): 25 min  Problem List:  Patient Active Problem List   Diagnosis Date Noted  . Ventilator dependence (HCC)   . Candidemia (HCC)   . Tracheostomy status (HCC)   . Hypoxemia   . Acute on chronic respiratory failure with hypoxemia (HCC)   . Acute respiratory failure (HCC)   . 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 Acute encephalopathy 08/01/2017  . Healthcare-associated pneumonia   . Hyperkalemia   . Acute kidney injury (HCC)   . Atrial fibrillation with rapid ventricular response (HCC)   . Acute pulmonary edema (HCC)   . Pressure injury of skin 01/23/2017  . Septic shock (HCC) 01/22/2017   Past Medical History:  Past Medical History:  Diagnosis Date  . A-fib (HCC)   . Hx of AKA (above knee amputation), right (HCC)   . Tracheostomy in place [facility](HCC)    Past Surgical History:  Past Surgical History:  Procedure Laterality Date  . 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 Pt says that he has been working with SLP at [facility] and wears the PMV during waking hours.   Assessment / Plan / Recommendation Clinical Impression  Pt is moderately dysarthric at the phrase level, requiring Mod cues for repetitions and use of speech intelligiblity strategies. He recalled several strategies that his primary SLP at [facility] taught him, but needs assistance to utilize them functionally to  communicate. SLP will continue to assist acutely to facilitate return to primary therapist.     SLP Assessment  SLP Recommendation/Assessment: Patient needs continued Speech Lanaguage Pathology Services SLP Visit Diagnosis: Dysarthria and anarthria (R47.1)    Follow Up Recommendations  LTACH;Skilled Nursing facility    Frequency and Duration min 2x/week  2 weeks      SLP Evaluation Cognition  Overall Cognitive Status: History of cognitive impairments - at baseline Orientation Level: Intubated/Tracheostomy - Unable to assess       Comprehension  Auditory Comprehension Overall Auditory Comprehension: Appears within functional limits for tasks assessed(simple commands, questions)    Expression Expression Primary Mode of Expression: Verbal Verbal Expression Overall Verbal Expression: Appears within functional limits for tasks assessed   Oral / Motor  Oral Motor/Sensory Function Overall Oral Motor/Sensory Function: Generalized oral weakness Motor Speech Overall Motor Speech: Impaired Respiration: Impaired Level of Impairment: Sentence Phonation: Wet Resonance: Within functional limits Articulation: Impaired Level of Impairment: Phrase Intelligibility: Intelligibility reduced Phrase: 50-74% accurate Sentence: 25-49% accurate Motor Planning: Witnin functional limits Effective Techniques: Slow rate;Increased vocal intensity;Over-articulate;Pause   GO                    Craig Oneal, Craig Oneal 08/12/2017, 11:18 AM  Craig Oneal, M.A. CCC-SLP [phone]

## 2017-08-12 NOTE — Discharge Instructions (Signed)

## 2017-08-12 NOTE — Progress Notes (Signed)
[name] - Stepdown/ICU TEAM  Craig Oneal.  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 Blood cultures sent and started on cefepime 11/26 and amikacin 11/27. Additionally, daughter reported patient normally requires ventilator at night but had required continously for the preceding two days.   Patient has been at [facility] since February 2018; prior to that he was at [facility].  Hospitalized at [facility] 01/2017 with Proteus bacteremia, sepsis, and hemorrhagic shock.  Additionally has history of Serratia and Pseudomonas from tracheal aspirate in July, both sensitive to Cefepime, as well as Enterobacter and Morganella. 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   Significant Events: 11/26 transfer to [facility] from [facility]  11/28 PEG tube replaced and upsized per IR   Subjective: Resting comfortably in bed.  Denies any complaints at this time.  No resp distress or discomfort evident.    Assessment & Plan:  Severe Sepsis multiple sources as below - sepsis physiology has resolved   Acute on chronic hypoxic respiratory failure w/ Pseudomonas aeruginosa PNA  Continue long-term antibiotic per ID (14 days) - clinically improving - vent wean and trach care per PCCM   Candida albicans Fungemia  continue antifungals per ID - ?PICC line related - currently on PICC holiday w functional peripheral IV - Opthal exam 11/30 w/o evidence of endophthalmitis   B Hdyro > R Hydronephrosis > resolved  resolved on f/u US therefore did not require nephrostomy tube -  Urology has evaluated - no further testing planned at this time   R renal collecting system hemorrhage Noted as a possible explanation for R hydro - hydro resolved - no gross hematuria   H/oCRE per [facility]  Chronic systolic CHF TTE 32/20/25 noted EF 40-45% - no signif volume overload on exam today Filed Weights   08/11/17 0426 08/11/17 1759 08/12/17 0358  Weight: 78.4 kg (172 lb 13.5 oz) 77.4 kg (170 lb 10.2 oz) 77.9 kg (171 lb 11.8 oz)    Chronic Atrial Fibrillation Remains in Afib w/ rate controlled - warfarin was being held due to possible acute renal hemorrhage - resumed warfarin w/o overlap   Recent Labs  Lab 08/07/17 0508 08/10/17 0302 08/11/17 0257 08/12/17 0507  INR 1.43 1.07 1.07 1.08    Acute on CKD stage III  crt stable in normal range   Recent Labs  Lab 08/07/17 0508 08/07/17 1246 08/08/17 0404 08/10/17 0302 08/11/17 0816  CREATININE 0.64 0.97 0.96 0.73 0.70    Chronic Foley - Neurogenic bladder Care as per Urology recs   Hypernatremia Corrected w/ free water - w/ Na now falling below normal will decrease free water - follow trend   Cholecystitis? No clinical sx to suggest acute cholecystitis   Dysphagia NPO - PEG > malpositioned > replaced by IR  Transaminitis chronic based on May 2018 labs - LFTs normal as of 12/6   Acute blood loss anemia  ?due to R renal hemorrhage but findings not entirely convincing - s/p 3 U PRBC - Hgb holding steady/climbing - follow w/ warfarin dosing   Hypothyroidism  Cont synthroid   Acute encephalopathy Due  to acute illness - resolved    DVT prophylaxis: SQ heparin + warfarin  Code Status: FULL CODE Family Communication: no family present at time of exam  Disposition Plan: SDU   Consultants:  PCCM Urology  ID  Antimicrobials:  Anidulafungin 11/29 > Ceftaz 11/27 >  Objective: Blood pressure 92/61, pulse 79, temperature 99.6 F (37.6 C), temperature source Oral, resp. rate 19, height 5' 11"   (1.803 m), weight 77.9 kg (171 lb 11.8 oz), SpO2 96 %.  Intake/Output Summary (Last 24 hours) at 08/12/2017 1704 Last data filed at 08/12/2017 1658 Gross per 24 hour  Intake 3633.75 ml  Output 1250 ml  Net 2383.75 ml   Filed Weights   08/11/17 0426 08/11/17 1759 08/12/17 0358  Weight: 78.4 kg (172 lb 13.5 oz) 77.4 kg (170 lb 10.2 oz) 77.9 kg (171 lb 11.8 oz)    Examination: General: No acute respiratory distress  Lungs: Clear to auscultation B w/o wheezing  Cardiovascular: regular rate - irreg w/o M  Abdomen: NT/ND, soft, bs+, no mass  Extremities: No significant edema B   CBC: Recent Labs  Lab 08/06/17 0533 08/07/17 0705 08/08/17 0404 08/10/17 0302  WBC 11.2* 13.2* 12.7* 13.1*  NEUTROABS  --  9.5* 9.3*  --   HGB 7.4* 9.0* 8.9* 10.0*  HCT 25.4* 30.2* 30.7* 33.3*  MCV 80.4 81.0 83.0 82.6  PLT 184 228 245 629   Basic Metabolic Panel: Recent Labs  Lab 08/06/17 0533 08/07/17 0508 08/07/17 1246 08/08/17 0404 08/10/17 0302 08/11/17 0816  NA 148* 149* 148* 149* 140 133*  K 3.3* 2.3* 4.1 3.7 3.8 3.8  CL 116* 128* 115* 116* 100* 95*  CO2 25 17* 27 27 30 29   GLUCOSE 109* 71 92 109* 111* 108*  BUN 43* 21* 31* 28* 26* 29*  CREATININE 1.28* 0.64 0.97 0.96 0.73 0.70  CALCIUM 8.0* 4.7* 7.8* 7.9* 8.1* 8.2*  MG 1.4*  --  1.8  --   --  1.4*  PHOS  --   --   --   --   --  2.5   GFR: Estimated Creatinine Clearance: 99.4 mL/min (by C-G formula based on SCr of 0.7 mg/dL).  Liver Function Tests: Recent Labs  Lab 08/10/17 0302 08/11/17 0816  AST 24 17  ALT 33 23  ALKPHOS 226* 209*  BILITOT 0.3 0.4  PROT 8.3* 8.1  ALBUMIN 1.9* 1.9*    Coagulation Profile: Recent Labs  Lab 08/07/17 0508 08/10/17 0302 08/11/17 0257 08/12/17 0507  INR 1.43 1.07 1.07 1.08    Recent Results (from the past 240 hour(s))  Culture, blood (Routine X 2) w Reflex to ID Panel     Status: None   Collection Time: 08/03/17  3:16 PM  Result Value Ref Range Status   Specimen Description BLOOD  LEFT HAND  Final   Special Requests IN PEDIATRIC BOTTLE Blood Culture adequate volume  Final   Culture NO GROWTH 5 DAYS  Final   Report Status 08/08/2017 FINAL  Final  Culture, blood (Routine X 2) w Reflex to ID Panel     Status: Abnormal   Collection Time: 08/03/17  3:28 PM  Result Value Ref Range Status   Specimen Description BLOOD LEFT HAND  Final   Special Requests IN PEDIATRIC BOTTLE Blood Culture adequate volume  Final   Culture  Setup Time   Final    YEAST IN PEDIATRIC BOTTLE CRITICAL VALUE NOTED.  VALUE IS CONSISTENT WITH PREVIOUSLY REPORTED AND CALLED VALUE.    Culture CANDIDA  ALBICANS (A)  Final   Report Status 08/07/2017 FINAL  Final  Culture, blood (Routine X 2) w Reflex to ID Panel     Status: None   Collection Time: 08/06/17 11:40 AM  Result Value Ref Range Status   Specimen Description BLOOD LEFT ANTECUBITAL  Final   Special Requests   Final    BOTTLES DRAWN AEROBIC AND ANAEROBIC Blood Culture results may not be optimal due to an excessive volume of blood received in culture bottles   Culture NO GROWTH 5 DAYS  Final   Report Status 08/11/2017 FINAL  Final  Culture, blood (Routine X 2) w Reflex to ID Panel     Status: None   Collection Time: 08/06/17 11:41 AM  Result Value Ref Range Status   Specimen Description BLOOD RIGHT HAND  Final   Special Requests   Final    BOTTLES DRAWN AEROBIC AND ANAEROBIC Blood Culture adequate volume   Culture NO GROWTH 5 DAYS  Final   Report Status 08/11/2017 FINAL  Final     Scheduled Meds: . atorvastatin  10 mg Per Tube QHS  . baclofen  5 mg Per Tube BID  . chlorhexidine gluconate (MEDLINE KIT)  15 mL Mouth Rinse BID  . diltiazem  60 mg Per Tube Q6H  . free water  350 mL Per Tube Q4H  . glycopyrrolate  0.1 mg Intravenous BID  . heparin injection (subcutaneous)  5,000 Units Subcutaneous Q8H  . levothyroxine  125 mcg Per Tube QAC breakfast  . mouth rinse  15 mL Mouth Rinse QID  . metoprolol tartrate  12.5 mg Per Tube BID  .  pantoprazole sodium  40 mg Per Tube Daily  . senna-docusate  1 tablet Per Tube QHS  . simethicone  40 mg Per Tube QID  . warfarin  7.5 mg Oral ONCE-1800  . Warfarin - Pharmacist Dosing Inpatient   Does not apply q1800     LOS: 11 days   Cherene Altes, MD [facility] Office  [phone] Pager - Text Page per Amion as per below:  On-Call/Text Page:      [email or web address]      password [email or web address]  If 7PM-7AM, please contact night-coverage [email or web address] Password [email or web address] 08/12/2017, 5:04 PM

## 2017-08-12 NOTE — Care Management Note (Signed)
Case Management Note  Patient Details  Name: Craig DiegoCharlie J Shaddock Jr. MRN: [id number] Date of Birth: August 23, 1951  Subjective/Objective:  Form [facility],  Presents with chronic resp failure, hypoxemia, increased secretion, attempt trach collar 24/7.                   Action/Plan: [facility], CSW following.  Expected Discharge Date:                  Expected Discharge Plan:  Skilled Nursing Facility[facility]  In-House Referral:  Clinical Social Work  Discharge planning Services  CM Consult  Post Acute Care Choice:    Choice offered to:     DME Arranged:    DME Agency:     HH Arranged:    HH Agency:     Status of Service:     If discussed at [employer] Meetings, dates discussed:    Additional Comments:  Leone Havenaylor, Koralynn Greenspan [place], RN 08/12/2017, 4:47 PM

## 2017-08-12 NOTE — Progress Notes (Signed)
Bl[place], NP notified of HR dropping to 40's non-sustained, HR sustaining in 60s. 2.29 second pause noted.

## 2017-08-12 NOTE — Plan of Care (Signed)
  Progressing Education: Knowledge of General Education information will improve 08/12/2017 0159 - Progressing by Della Gooummings, Jaylenne Hamelin R, RN 08/12/2017 [phone] - Not Progressing by Della Gooummings, Evie Crumpler R, RN Clinical Measurements: Ability to maintain clinical measurements within normal limits will improve 08/12/2017 0159 - Progressing by Della Gooummings, Tyberius Ryner R, RN 08/12/2017 [phone] - Progressing by Della Gooummings, Arael Piccione R, RN Will remain free from infection 08/12/2017 0159 - Progressing by Della Gooummings, Venessa Wickham R, RN 08/12/2017 [phone] - Progressing by Della Gooummings, Viviana Trimble R, RN Diagnostic test results will improve 08/12/2017 0159 - Progressing by Della Gooummings, Blayke Pinera R, RN 08/12/2017 [phone] - Progressing by Della Gooummings, Jennalynn Rivard R, RN Respiratory complications will improve 08/12/2017 0159 - Progressing by Della Gooummings, Makiah Foye R, RN 08/12/2017 [phone] - Progressing by Della Gooummings, Shaunee Mulkern R, RN Cardiovascular complication will be avoided 08/12/2017 0159 - Progressing by Della Gooummings, Waseem Suess R, RN 08/12/2017 [phone] - Progressing by Della Gooummings, Clance Baquero R, RN Activity: Risk for activity intolerance will decrease 08/12/2017 0159 - Progressing by Della Gooummings, Alainna Stawicki R, RN 08/12/2017 0156 - Progressing by Della Gooummings, Abelino Tippin R, RN Coping: Level of anxiety will decrease 08/12/2017 0159 - Progressing by Della Gooummings, Tabetha Haraway R, RN 08/12/2017 0156 - Progressing by Della Gooummings, Zosia Lucchese R, RN Elimination: Will not experience complications related to bowel motility 08/12/2017 0159 - Progressing by Della Gooummings, Trevan Messman R, RN 08/12/2017 0156 - Progressing by Della Gooummings, Julie Nay R, RN Will not experience complications related to urinary retention 08/12/2017 0159 - Progressing by Della Gooummings, Coron Rossano R, RN 08/12/2017 0156 - Progressing by Della Gooummings, Lak[place] Schweppe R, RN Pain Managment: General experience of comfort will improve 08/12/2017 0159 - Progressing by Della Gooummings, Nichalas Coin R, RN 08/12/2017 0156 - Progressing by Della Gooummings, Laronica Bhagat R, RN Safety: Ability to remain free from injury will  improve 08/12/2017 0159 - Progressing by Della Gooummings, Skyelyn Scruggs R, RN 08/12/2017 0156 - Progressing by Della Gooummings, Quince Santana R, RN Skin Integrity: Risk for impaired skin integrity will decrease 08/12/2017 0159 - Progressing by Della Gooummings, Harol Shabazz R, RN 08/12/2017 [phone] - Progressing by Della Gooummings, Hyman Crossan R, RN

## 2017-08-13 ENCOUNTER — Encounter (HOSPITAL_COMMUNITY): Payer: Self-pay | Admitting: *Deleted

## 2017-08-13 LAB — COMPREHENSIVE METABOLIC PANEL
ALBUMIN: 1.9 g/dL — AB (ref 3.5–5.0)
ALT: 18 U/L (ref 17–63)
AST: 16 U/L (ref 15–41)
Alkaline Phosphatase: 170 U/L — ABNORMAL HIGH (ref 38–126)
Anion gap: 5 (ref 5–15)
BUN: 27 mg/dL — AB (ref 6–20)
CHLORIDE: 99 mmol/L — AB (ref 101–111)
CO2: 35 mmol/L — AB (ref 22–32)
CREATININE: 0.8 mg/dL (ref 0.61–1.24)
Calcium: 8.5 mg/dL — ABNORMAL LOW (ref 8.9–10.3)
GFR calc Af Amer: >60 mL/min (ref >60)
GFR calc non Af Amer: >60 mL/min (ref >60)
GLUCOSE: 114 mg/dL — AB (ref 65–99)
POTASSIUM: 4.5 mmol/L (ref 3.5–5.1)
Sodium: 139 mmol/L (ref 135–145)
Total Bilirubin: 0.3 mg/dL (ref 0.3–1.2)
Total Protein: 7.7 g/dL (ref 6.5–8.1)

## 2017-08-13 LAB — CBC
HEMATOCRIT: 31.4 % — AB (ref 39.0–52.0)
Hemoglobin: 9.1 g/dL — ABNORMAL LOW (ref 13.0–17.0)
MCH: 23.9 pg — AB (ref 26.0–34.0)
MCHC: 29 g/dL — AB (ref 30.0–36.0)
MCV: 82.4 fL (ref 78.0–100.0)
PLATELETS: 277 10*3/uL (ref 150–400)
RBC: 3.81 MIL/uL — ABNORMAL LOW (ref 4.22–5.81)
RDW: 18 % — AB (ref 11.5–15.5)
WBC: 11.1 10*3/uL — ABNORMAL HIGH (ref 4.0–10.5)

## 2017-08-13 LAB — GLUCOSE, CAPILLARY
GLUCOSE-CAPILLARY: 108 mg/dL — AB (ref 65–99)
GLUCOSE-CAPILLARY: 108 mg/dL — AB (ref 65–99)
GLUCOSE-CAPILLARY: 110 mg/dL — AB (ref 65–99)
GLUCOSE-CAPILLARY: 92 mg/dL (ref 65–99)
Glucose-Capillary: 99 mg/dL (ref 65–99)
Glucose-Capillary: 108 mg/dL — ABNORMAL HIGH (ref 65–99)

## 2017-08-13 LAB — PROTIME-INR
INR: 1.16
PROTHROMBIN TIME: 14.7 s (ref 11.4–15.2)

## 2017-08-13 MED ORDER — TRAZODONE HCL 50 MG PO TABS
50.0000 mg | ORAL_TABLET | Freq: Once | ORAL | Status: AC
  Administered 2017-08-13: 50 mg via ORAL
  Filled 2017-08-13: qty 1

## 2017-08-13 MED ORDER — DILTIAZEM 12 MG/ML ORAL SUSPENSION
30.0000 mg | Freq: Four times a day (QID) | ORAL | Status: DC
  Administered 2017-08-13 – 2017-08-20 (×25): 30 mg
  Filled 2017-08-13 (×31): qty 3

## 2017-08-13 MED ORDER — SODIUM CHLORIDE 0.9 % IV BOLUS (SEPSIS)
500.0000 mL | Freq: Once | INTRAVENOUS | Status: AC
  Administered 2017-08-13: 500 mL via INTRAVENOUS

## 2017-08-13 MED ORDER — IPRATROPIUM-ALBUTEROL 0.5-2.5 (3) MG/3ML IN SOLN
3.0000 mL | Freq: Three times a day (TID) | RESPIRATORY_TRACT | Status: DC
  Administered 2017-08-13 – 2017-08-20 (×22): 3 mL via RESPIRATORY_TRACT
  Filled 2017-08-13 (×22): qty 3

## 2017-08-13 MED ORDER — WARFARIN SODIUM 7.5 MG PO TABS
7.5000 mg | ORAL_TABLET | Freq: Once | ORAL | Status: DC
  Filled 2017-08-13 (×2): qty 1

## 2017-08-13 NOTE — Progress Notes (Signed)
ANTICOAGULATION CONSULT NOTE  Pharmacy Consult:  Coumadin Indication: atrial fibrillation  No Known Allergies  Patient Measurements: Height: 5\' 11"  (180.3 cm) Weight: 176 lb 5.9 oz (80 kg) IBW/kg (Calculated) : 75.3  Assessment: 66 YOM who had Afib with RVR 11/26, hx PAF not on AC PTA. Hx of renal hemorrhage but has resolved. Coumadin started 12/5, also on heparin SQ.   INR slow to trend up - 1.16 this morning. Hgb 9.1, plts 277- no overt bleeding noted.  Goal of Therapy:  INR 2-3 Monitor platelets by anticoagulation protocol: Yes   Plan:  Give Coumadin 7.5mg  PO x 1 again tonight Monitor daily INR, CBC, s/s of bleed Continue heparin SQ until INR therapeutic  Khristie Sak D. Axcel Horsch, PharmD, BCPS Clinical Pharmacist Clinical Phone for 08/13/2017 until 3:30pm: [phone] If after 3:30pm, please call main pharmacy at [phone] 08/13/2017 12:53 PM

## 2017-08-13 NOTE — Clinical Social Work Note (Signed)
CSW left a HIPAA compliant voicemail for [facility] letting her know that patient is stable for discharge once they have a bed available.  Charlynn CourtSarah Madiline Saffran, CSW [phone]

## 2017-08-13 NOTE — Progress Notes (Signed)
Text sent to Dr Sharon SellerMcClung that patient's HR droped to 25 b/m afte taking 1300 dose of 60 mg Caredizem.  Patient remained awake and responsive.

## 2017-08-13 NOTE — Plan of Care (Signed)
Patient becoming more involved in POC.

## 2017-08-13 NOTE — Progress Notes (Signed)
Pt. found to be asymptomatic brady with HR in the 40-50's, with several very brief periods in the 30's. BP 90/55. RR and O2 sat. within normal limits. Provider notified, and order for 500 NS bolus received. Will continue to monitor.

## 2017-08-13 NOTE — Progress Notes (Signed)
Craig Oneal  Craig J Hinckley Jr.  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 Blood cultures sent and started on cefepime 11/26 and amikacin 11/27. Additionally, daughter reported patient normally requires ventilator nly at night but had required continously for the preceding two days.   Patient had been at [facility] since February 2018; prior to that he was at [facility].  Hospitalized at [facility] 01/2017 with Proteus bacteremia, sepsis, and hemorrhagic shock.  Additionally has history of Serratia and Pseudomonas from tracheal aspirate in July, both sensitive to Cefepime, as well as Enterobacter and Morganella. 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   Significant Events: 11/26 transfer to [facility] from [facility]  11/28 PEG tube replaced and upsized per IR   Subjective:  Resting comfortably in bed.  Denies cp, sob, n/v, or abdom pain.  No evidence of distress.    Assessment & Plan:  Severe Sepsis multiple sources as below - sepsis has resolved   Acute on chronic hypoxic respiratory failure w/ Pseudomonas aeruginosa PNA  Has completed extended course antibiotic per ID (10 days) - clinically much improved - currently doing well on 24hr trial of TC  Candida albicans Fungemia  continue antifungals per ID to complete 14 days of tx - ?PICC line related - currently on PICC holiday w functional peripheral IV - Opthal exam 11/30 w/o evidence of endophthalmitis   B Hdyro > R Hydronephrosis > resolved  resolved on f/u US therefore did not  require nephrostomy tube - Urology has evaluated - no further testing planned at this time - UOP brisk    R renal collecting system hemorrhage Noted as a possible explanation for R hydro - hydro resolved - no gross hematuria   H/oCRE per [facility]  Chronic systolic CHF TTE 05/69/79 noted EF 40-45% - no signif volume overload on exam  Filed Weights   08/11/17 1759 08/12/17 0358 08/13/17 0500  Weight: 77.4 kg (170 lb 10.2 oz) 77.9 kg (171 lb 11.8 oz) 80 kg (176 lb 5.9 oz)    Chronic Atrial Fibrillation Remains in Afib w/ rate controlled - warfarin was being held due to possible acute renal hemorrhage - resumed warfarin w/o overlap - INR slow to improve    Recent Labs  Lab 08/07/17 0508 08/10/17 0302 08/11/17 0257 08/12/17 0507 08/13/17 0247  INR 1.43 1.07 1.07 1.08 1.16    Acute on CKD stage III  crt stable in normal range   Recent Labs  Lab 08/07/17 1246 08/08/17 0404 08/10/17 0302 08/11/17 0816 08/13/17 0247  CREATININE 0.97 0.96 0.73 0.70 0.80    Chronic Foley - Neurogenic bladder Care as per Urology recs - consistent urine output   Hypernatremia Corrected w/ free water - Na normal today   Dysphagia NPO - PEG > malpositioned > replaced by IR - tolerating tube feeds w/o difficulty   Acute blood loss anemia  ?due to R renal hemorrhage but findings not entirely convincing - s/p 3 U PRBC - Hgb holding steady/climbing - follow w/ warfarin dosing   Hypothyroidism  Cont synthroid   Acute encephalopathy Due to acute  illness - resolved    DVT prophylaxis: SQ heparin + warfarin  Code Status: FULL CODE Family Communication: no family present at time of exam  Disposition Plan: SDU due to vent/trach needs - medically stable for d/c back to [facility]   Consultants:  PCCM Urology  ID  Antimicrobials:  Anidulafungin 11/29 > Ceftaz 11/27 > 12/6  Objective: Blood pressure (!) 102/54, pulse 86, temperature 98.1 F (36.7 C), temperature source Oral, resp.  rate (!) 28, height 5' 11"  (1.803 m), weight 80 kg (176 lb 5.9 oz), SpO2 95 %.  Intake/Output Summary (Last 24 hours) at 08/13/2017 1047 Last data filed at 08/13/2017 0526 Gross per 24 hour  Intake 1040 ml  Output 3250 ml  Net -2210 ml   Filed Weights   08/11/17 1759 08/12/17 0358 08/13/17 0500  Weight: 77.4 kg (170 lb 10.2 oz) 77.9 kg (171 lb 11.8 oz) 80 kg (176 lb 5.9 oz)    Examination: General: No acute respiratory distress - alert and pleasant  Lungs: Clear to auscultation B w/ exception to some course upper airway sounds  Cardiovascular: regular rate - no M or rub  Abdomen: NT/ND, soft, bs+, no mass - PEG insertion clean and dry   Extremities: No significant edema LE  CBC: Recent Labs  Lab 08/07/17 0705 08/08/17 0404 08/10/17 0302 08/13/17 0247  WBC 13.2* 12.7* 13.1* 11.1*  NEUTROABS 9.5* 9.3*  --   --   HGB 9.0* 8.9* 10.0* 9.1*  HCT 30.2* 30.7* 33.3* 31.4*  MCV 81.0 83.0 82.6 82.4  PLT 228 245 249 871   Basic Metabolic Panel: Recent Labs  Lab 08/07/17 1246 08/08/17 0404 08/10/17 0302 08/11/17 0816 08/13/17 0247  NA 148* 149* 140 133* 139  K 4.1 3.7 3.8 3.8 4.5  CL 115* 116* 100* 95* 99*  CO2 27 27 30 29  35*  GLUCOSE 92 109* 111* 108* 114*  BUN 31* 28* 26* 29* 27*  CREATININE 0.97 0.96 0.73 0.70 0.80  CALCIUM 7.8* 7.9* 8.1* 8.2* 8.5*  MG 1.8  --   --  1.4*  --   PHOS  --   --   --  2.5  --    GFR: Estimated Creatinine Clearance: 99.4 mL/min (by C-G formula based on SCr of 0.8 mg/dL).  Liver Function Tests: Recent Labs  Lab 08/10/17 0302 08/11/17 0816 08/13/17 0247  AST 24 17 16   ALT 33 23 18  ALKPHOS 226* 209* 170*  BILITOT 0.3 0.4 0.3  PROT 8.3* 8.1 7.7  ALBUMIN 1.9* 1.9* 1.9*    Coagulation Profile: Recent Labs  Lab 08/07/17 0508 08/10/17 0302 08/11/17 0257 08/12/17 0507 08/13/17 0247  INR 1.43 1.07 1.07 1.08 1.16    Recent Results (from the past 240 hour(s))  Culture, blood (Routine X 2) w Reflex to ID Panel     Status: None    Collection Time: 08/03/17  3:16 PM  Result Value Ref Range Status   Specimen Description BLOOD LEFT HAND  Final   Special Requests IN PEDIATRIC BOTTLE Blood Culture adequate volume  Final   Culture NO GROWTH 5 DAYS  Final   Report Status 08/08/2017 FINAL  Final  Culture, blood (Routine X 2) w Reflex to ID Panel     Status: Abnormal   Collection Time: 08/03/17  3:28 PM  Result Value Ref Range Status   Specimen Description BLOOD LEFT HAND  Final   Special Requests IN PEDIATRIC BOTTLE Blood Culture adequate volume  Final   Culture  Setup Time  Final    YEAST IN PEDIATRIC BOTTLE CRITICAL VALUE NOTED.  VALUE IS CONSISTENT WITH PREVIOUSLY REPORTED AND CALLED VALUE.    Culture CANDIDA ALBICANS (A)  Final   Report Status 08/07/2017 FINAL  Final  Culture, blood (Routine X 2) w Reflex to ID Panel     Status: None   Collection Time: 08/06/17 11:40 AM  Result Value Ref Range Status   Specimen Description BLOOD LEFT ANTECUBITAL  Final   Special Requests   Final    BOTTLES DRAWN AEROBIC AND ANAEROBIC Blood Culture results may not be optimal due to an excessive volume of blood received in culture bottles   Culture NO GROWTH 5 DAYS  Final   Report Status 08/11/2017 FINAL  Final  Culture, blood (Routine X 2) w Reflex to ID Panel     Status: None   Collection Time: 08/06/17 11:41 AM  Result Value Ref Range Status   Specimen Description BLOOD RIGHT HAND  Final   Special Requests   Final    BOTTLES DRAWN AEROBIC AND ANAEROBIC Blood Culture adequate volume   Culture NO GROWTH 5 DAYS  Final   Report Status 08/11/2017 FINAL  Final     Scheduled Meds: . atorvastatin  10 mg Per Tube QHS  . baclofen  5 mg Per Tube BID  . chlorhexidine gluconate (MEDLINE KIT)  15 mL Mouth Rinse BID  . diltiazem  60 mg Per Tube Q6H  . free water  200 mL Per Tube Q6H  . glycopyrrolate  0.1 mg Intravenous BID  . heparin injection (subcutaneous)  5,000 Units Subcutaneous Q8H  . ipratropium-albuterol  3 mL Nebulization  TID  . levothyroxine  125 mcg Per Tube QAC breakfast  . mouth rinse  15 mL Mouth Rinse QID  . metoprolol tartrate  12.5 mg Per Tube BID  . pantoprazole sodium  40 mg Per Tube Daily  . senna-docusate  1 tablet Per Tube QHS  . simethicone  40 mg Per Tube QID  . Warfarin - Pharmacist Dosing Inpatient   Does not apply q1800     LOS: 12 days   Cherene Altes, MD [facility] Office  [phone] Pager - Text Page per Amion as per below:  On-Call/Text Page:      [email or web address]      password [email or web address]  If 7PM-7AM, please contact night-coverage [email or web address] Password [email or web address] 08/13/2017, 10:47 AM

## 2017-08-14 LAB — BASIC METABOLIC PANEL
Anion gap: 9 (ref 5–15)
BUN: 27 mg/dL — ABNORMAL HIGH (ref 6–20)
CHLORIDE: 96 mmol/L — AB (ref 101–111)
CO2: 32 mmol/L (ref 22–32)
CREATININE: 0.7 mg/dL (ref 0.61–1.24)
Calcium: 8.8 mg/dL — ABNORMAL LOW (ref 8.9–10.3)
GFR calc non Af Amer: >60 mL/min (ref >60)
Glucose, Bld: 110 mg/dL — ABNORMAL HIGH (ref 65–99)
POTASSIUM: 5 mmol/L (ref 3.5–5.1)
SODIUM: 137 mmol/L (ref 135–145)

## 2017-08-14 LAB — BLOOD GAS, ARTERIAL
ACID-BASE EXCESS: 13.3 mmol/L — AB (ref 0.0–2.0)
BICARBONATE: 39.3 mmol/L — AB (ref 20.0–28.0)
Drawn by: 519031
FIO2: 35
O2 SAT: 89.7 %
PATIENT TEMPERATURE: 98.5
PO2 ART: 60.4 mmHg — AB (ref 83.0–108.0)
pCO2 arterial: 72.5 mmHg (ref 32.0–48.0)
pH, Arterial: 7.354 (ref 7.350–7.450)

## 2017-08-14 LAB — CBC
HCT: 34.7 % — ABNORMAL LOW (ref 39.0–52.0)
Hemoglobin: 10 g/dL — ABNORMAL LOW (ref 13.0–17.0)
MCH: 24.2 pg — AB (ref 26.0–34.0)
MCHC: 28.8 g/dL — AB (ref 30.0–36.0)
MCV: 84 fL (ref 78.0–100.0)
Platelets: 289 10*3/uL (ref 150–400)
RBC: 4.13 MIL/uL — ABNORMAL LOW (ref 4.22–5.81)
RDW: 18.2 % — ABNORMAL HIGH (ref 11.5–15.5)
WBC: 13.4 10*3/uL — AB (ref 4.0–10.5)

## 2017-08-14 LAB — MAGNESIUM: MAGNESIUM: 1.7 mg/dL (ref 1.7–2.4)

## 2017-08-14 LAB — GLUCOSE, CAPILLARY
GLUCOSE-CAPILLARY: 119 mg/dL — AB (ref 65–99)
GLUCOSE-CAPILLARY: 105 mg/dL — AB (ref 65–99)
Glucose-Capillary: 105 mg/dL — ABNORMAL HIGH (ref 65–99)
Glucose-Capillary: 110 mg/dL — ABNORMAL HIGH (ref 65–99)
Glucose-Capillary: 106 mg/dL — ABNORMAL HIGH (ref 65–99)

## 2017-08-14 LAB — PROTIME-INR
INR: 1.11
Prothrombin Time: 14.2 seconds (ref 11.4–15.2)

## 2017-08-14 MED ORDER — WARFARIN SODIUM 5 MG PO TABS
10.0000 mg | ORAL_TABLET | Freq: Once | ORAL | Status: AC
  Administered 2017-08-14: 10 mg via ORAL
  Filled 2017-08-14: qty 2

## 2017-08-14 MED ORDER — ENOXAPARIN SODIUM 80 MG/0.8ML ~~LOC~~ SOLN
80.0000 mg | Freq: Once | SUBCUTANEOUS | Status: AC
Start: 2017-08-15 — End: 2017-08-14
  Administered 2017-08-14: 80 mg via SUBCUTANEOUS
  Filled 2017-08-14: qty 0.8

## 2017-08-14 MED ORDER — GLYCOPYRROLATE 0.2 MG/ML IJ SOLN
0.2000 mg | Freq: Two times a day (BID) | INTRAMUSCULAR | Status: DC
  Administered 2017-08-14 – 2017-08-19 (×10): 0.2 mg via INTRAVENOUS
  Filled 2017-08-14 (×10): qty 1

## 2017-08-14 MED ORDER — POLYETHYLENE GLYCOL 3350 17 G PO PACK: 17.0000 g | PACK | Freq: Every day | ORAL | Status: DC | PRN

## 2017-08-14 MED ORDER — ENOXAPARIN SODIUM 80 MG/0.8ML ~~LOC~~ SOLN
80.0000 mg | Freq: Two times a day (BID) | SUBCUTANEOUS | Status: DC
  Administered 2017-08-15 – 2017-08-18 (×8): 80 mg via SUBCUTANEOUS
  Filled 2017-08-14 (×8): qty 0.8

## 2017-08-14 MED ORDER — LACTULOSE 10 GM/15ML PO SOLN
20.0000 g | Freq: Two times a day (BID) | ORAL | Status: DC | PRN
  Administered 2017-08-14: 20 g
  Filled 2017-08-14: qty 30

## 2017-08-14 MED ORDER — MAGNESIUM CITRATE PO SOLN: 1.0000 | Freq: Every day | ORAL | Status: DC | PRN

## 2017-08-14 MED ORDER — ENOXAPARIN SODIUM 80 MG/0.8ML ~~LOC~~ SOLN
80.0000 mg | Freq: Once | SUBCUTANEOUS | Status: AC
  Administered 2017-08-14: 16:00:00 80 mg via SUBCUTANEOUS
  Filled 2017-08-14: qty 0.8

## 2017-08-14 NOTE — Progress Notes (Signed)
[place] TEAM 1 - Stepdown/ICU TEAM  Craig J Waddington Jr.  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 Blood cultures sent and started on cefepime 11/26 and amikacin 11/27. Additionally, daughter reported patient normally requires ventilator only at night but had required continously for the preceding two days.   Patient had been at [facility] since February 2018; prior to that he was at [facility].  Hospitalized at [facility] 01/2017 with Proteus bacteremia, sepsis, and hemorrhagic shock.  Additionally has history of Serratia and Pseudomonas from tracheal aspirate in July, both sensitive to Cefepime, as well as Enterobacter and Morganella. 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   Significant Events: 11/26 transfer to [facility] from [facility]  11/28 PEG tube replaced and upsized per IR   Subjective: Pt is in good spirits.  He denies any complaints.  His RN has noted he is requiring trach suctioning on an hourly basis.    Assessment & Plan:  Severe Sepsis multiple sources as below - sepsis has resolved   Acute on chronic hypoxic respiratory failure w/ Pseudomonas aeruginosa PNA  Has completed extended course antibiotic per ID (10 days) - clinically much improved - currently doing well on 24hr TC w/ exception to secretions - increase robinul and follow   Candida albicans Fungemia  continue antifungals per ID to complete 14 days of tx - ?PICC line related - currently on PICC holiday w functional peripheral IV - Opthal exam 11/30 w/o evidence of endophthalmitis   B Hdyro > R  Hydronephrosis > resolved  resolved on f/u US therefore did not require nephrostomy tube - Urology has evaluated - no further testing planned at this time - UOP remains brisk    R renal collecting system hemorrhage Noted as a possible explanation for R hydro - hydro resolved - no gross hematuria   H/oCRE per [facility]  Chronic systolic CHF TTE 22/63/33 noted EF 40-45% - no signif volume overload on exam - weight stable/improving  Filed Weights   08/12/17 0358 08/13/17 0500 08/14/17 0424  Weight: 77.9 kg (171 lb 11.8 oz) 80 kg (176 lb 5.9 oz) 77.5 kg (170 lb 13.7 oz)    Chronic Atrial Fibrillation Remains in Afib w/ rate controlled - warfarin was being held due to possible acute renal hemorrhage - resumed warfarin w/o overlap - INR slow to improve - begin lovenox today for bridge   Recent Labs  Lab 08/10/17 0302 08/11/17 0257 08/12/17 0507 08/13/17 0247 08/14/17 0435  INR 1.07 1.07 1.08 1.16 1.11    Acute on CKD stage III - resolved  crt stable in normal range   Recent Labs  Lab 08/08/17 0404 08/10/17 0302 08/11/17 0816 08/13/17 0247 08/14/17 0435  CREATININE 0.96 0.73 0.70 0.80 0.70    Chronic Foley - Neurogenic bladder Care as per Urology recs - consistent urine output   Hypernatremia Corrected w/ free water - Na remains normal  Dysphagia NPO - PEG > malpositioned > replaced by IR - tolerating tube feeds w/o difficulty   Acute blood loss anemia  ?due to R renal hemorrhage but findings not entirely convincing - s/p 3 U PRBC -  Hgb holding steady/climbing - follow w/ warfarin dosing   Hypothyroidism  Cont synthroid   Acute encephalopathy Due to acute illness - resolved    DVT prophylaxis: SQ heparin + warfarin  Code Status: FULL CODE Family Communication: no family present at time of exam  Disposition Plan: SDU due to vent/trach needs - medically stable for d/c back to [facility] when bed available   Consultants:  PCCM Urology  ID  Antimicrobials:    Anidulafungin 11/29 > Ceftaz 11/27 > 12/6  Objective: Blood pressure 114/78, pulse (!) 105, temperature 98.6 F (37 C), temperature source Oral, resp. rate (!) 26, height 5' 11"  (1.803 m), weight 77.5 kg (170 lb 13.7 oz), SpO2 96 %.  Intake/Output Summary (Last 24 hours) at 08/14/2017 1143 Last data filed at 08/14/2017 3086 Gross per 24 hour  Intake -  Output 3150 ml  Net -3150 ml   Filed Weights   08/12/17 0358 08/13/17 0500 08/14/17 0424  Weight: 77.9 kg (171 lb 11.8 oz) 80 kg (176 lb 5.9 oz) 77.5 kg (170 lb 13.7 oz)    Examination: General: No acute respiratory distress - smiling  Lungs: Clear to auscultation B - course upper airway sounds  Cardiovascular: regular rate w/o M  Abdomen: NT/ND, soft, bs+, no mass Extremities: No significant edema LE  CBC: Recent Labs  Lab 08/08/17 0404 08/10/17 0302 08/13/17 0247 08/14/17 0435  WBC 12.7* 13.1* 11.1* 13.4*  NEUTROABS 9.3*  --   --   --   HGB 8.9* 10.0* 9.1* 10.0*  HCT 30.7* 33.3* 31.4* 34.7*  MCV 83.0 82.6 82.4 84.0  PLT 245 249 277 578   Basic Metabolic Panel: Recent Labs  Lab 08/07/17 1246 08/08/17 0404 08/10/17 0302 08/11/17 0816 08/13/17 0247 08/14/17 0435  NA 148* 149* 140 133* 139 137  K 4.1 3.7 3.8 3.8 4.5 5.0  CL 115* 116* 100* 95* 99* 96*  CO2 27 27 30 29  35* 32  GLUCOSE 92 109* 111* 108* 114* 110*  BUN 31* 28* 26* 29* 27* 27*  CREATININE 0.97 0.96 0.73 0.70 0.80 0.70  CALCIUM 7.8* 7.9* 8.1* 8.2* 8.5* 8.8*  MG 1.8  --   --  1.4*  --  1.7  PHOS  --   --   --  2.5  --   --    GFR: Estimated Creatinine Clearance: 99.4 mL/min (by C-G formula based on SCr of 0.7 mg/dL).  Liver Function Tests: Recent Labs  Lab 08/10/17 0302 08/11/17 0816 08/13/17 0247  AST 24 17 16   ALT 33 23 18  ALKPHOS 226* 209* 170*  BILITOT 0.3 0.4 0.3  PROT 8.3* 8.1 7.7  ALBUMIN 1.9* 1.9* 1.9*    Coagulation Profile: Recent Labs  Lab 08/10/17 0302 08/11/17 0257 08/12/17 0507 08/13/17 0247 08/14/17 0435  INR  1.07 1.07 1.08 1.16 1.11    Recent Results (from the past 240 hour(s))  Culture, blood (Routine X 2) w Reflex to ID Panel     Status: None   Collection Time: 08/06/17 11:40 AM  Result Value Ref Range Status   Specimen Description BLOOD LEFT ANTECUBITAL  Final   Special Requests   Final    BOTTLES DRAWN AEROBIC AND ANAEROBIC Blood Culture results may not be optimal due to an excessive volume of blood received in culture bottles   Culture NO GROWTH 5 DAYS  Final   Report Status 08/11/2017 FINAL  Final  Culture, blood (Routine X 2) w Reflex to ID Panel     Status: None  Collection Time: 08/06/17 11:41 AM  Result Value Ref Range Status   Specimen Description BLOOD RIGHT HAND  Final   Special Requests   Final    BOTTLES DRAWN AEROBIC AND ANAEROBIC Blood Culture adequate volume   Culture NO GROWTH 5 DAYS  Final   Report Status 08/11/2017 FINAL  Final     Scheduled Meds: . atorvastatin  10 mg Per Tube QHS  . baclofen  5 mg Per Tube BID  . chlorhexidine gluconate (MEDLINE KIT)  15 mL Mouth Rinse BID  . diltiazem  30 mg Per Tube Q6H  . free water  200 mL Per Tube Q6H  . glycopyrrolate  0.1 mg Intravenous BID  . heparin injection (subcutaneous)  5,000 Units Subcutaneous Q8H  . ipratropium-albuterol  3 mL Nebulization TID  . levothyroxine  125 mcg Per Tube QAC breakfast  . mouth rinse  15 mL Mouth Rinse QID  . metoprolol tartrate  12.5 mg Per Tube BID  . pantoprazole sodium  40 mg Per Tube Daily  . senna-docusate  1 tablet Per Tube QHS  . simethicone  40 mg Per Tube QID  . warfarin  7.5 mg Oral ONCE-1800  . Warfarin - Pharmacist Dosing Inpatient   Does not apply q1800     LOS: 49 days   Cherene Altes, MD [facility] Office  [phone] Pager - Text Page per Amion as per below:  On-Call/Text Page:      [email or web address]      password [email or web address]  If 7PM-7AM, please contact night-coverage [email or web address] Password [email or web address] 08/14/2017, 11:43 AM

## 2017-08-14 NOTE — Progress Notes (Signed)
ABG obtained d/t respiratory distress (RR 35-40 and Sats 87-90%). Pt had critical values. Patient placed on ventilator with ordered settings. Will continue to monitor patient's respiratory status. Dr Sharon SellerMcClung notified by RN.

## 2017-08-14 NOTE — Progress Notes (Addendum)
ANTICOAGULATION CONSULT NOTE  Pharmacy Consult:  Coumadin + Lovenox Indication: atrial fibrillation  No Known Allergies  Patient Measurements: Height: 5\' 11"  (180.3 cm) Weight: 170 lb 13.7 oz (77.5 kg) IBW/kg (Calculated) : 75.3  Assessment: 66 YOM who had Afib with RVR 11/26, hx PAF not on AC PTA. Hx of renal hemorrhage but has resolved. Coumadin started 12/5, also on heparin SQ.   INR very slow to trend up - 1.11 this morning.  Warfarin 7.5mg  was ordered by this pharmacist for last evening's dose. RN requested medication from pharmacy and charted "medication not available" at the same time yesterday evening. Medication was redispensed 2 minutes after request, but warfarin was never charted as given last evening.  Starting Lovenox d/t consistently low INR.  Hgb 9.1, plts 277- no overt bleeding noted.  Goal of Therapy:  INR 2-3 Monitor platelets by anticoagulation protocol: Yes   Plan:  -Lovenox 80mg  subQ x1 12/9 @ 1300, then 80mg  subQ 12/10 @ 0000 x1, then start 80mg  subQ q12h 12/10 @ 1100 (trying to schedule so patient is not being woken up in the middle of the night for an injection) -Coumadin 10mg  PO x 1 again tonight -Monitor daily INR, CBC, s/s of bleed   Jastin Fore D. Dyllan Hughett, PharmD, BCPS Clinical Pharmacist Clinical Phone for 08/14/2017 until 3:30pm: [phone] If after 3:30pm, please call main pharmacy at [phone] 08/14/2017 12:08 PM

## 2017-08-15 ENCOUNTER — Inpatient Hospital Stay (HOSPITAL_COMMUNITY): Payer: Medicare Other

## 2017-08-15 LAB — CBC
HEMATOCRIT: 30.7 % — AB (ref 39.0–52.0)
Hemoglobin: 9.2 g/dL — ABNORMAL LOW (ref 13.0–17.0)
MCH: 24.5 pg — AB (ref 26.0–34.0)
MCHC: 30 g/dL (ref 30.0–36.0)
MCV: 81.6 fL (ref 78.0–100.0)
PLATELETS: 245 10*3/uL (ref 150–400)
RBC: 3.76 MIL/uL — AB (ref 4.22–5.81)
RDW: 18.3 % — AB (ref 11.5–15.5)
WBC: 13.3 10*3/uL — ABNORMAL HIGH (ref 4.0–10.5)

## 2017-08-15 LAB — BASIC METABOLIC PANEL WITH GFR
Anion gap: 8 (ref 5–15)
BUN: 28 mg/dL — ABNORMAL HIGH (ref 6–20)
CO2: 36 mmol/L — ABNORMAL HIGH (ref 22–32)
Calcium: 8.9 mg/dL (ref 8.9–10.3)
Chloride: 94 mmol/L — ABNORMAL LOW (ref 101–111)
Creatinine, Ser: 0.77 mg/dL (ref 0.61–1.24)
GFR calc Af Amer: >60 mL/min
GFR calc non Af Amer: >60 mL/min
Glucose, Bld: 117 mg/dL — ABNORMAL HIGH (ref 65–99)
Potassium: 4.4 mmol/L (ref 3.5–5.1)
Sodium: 138 mmol/L (ref 135–145)

## 2017-08-15 LAB — BLOOD GAS, ARTERIAL
Acid-Base Excess: 12.7 mmol/L — ABNORMAL HIGH (ref 0.0–2.0)
Bicarbonate: 37.1 mmol/L — ABNORMAL HIGH (ref 20.0–28.0)
DRAWN BY: 519031
FIO2: 30
MECHVT: 600 mL
O2 SAT: 93.4 %
PATIENT TEMPERATURE: 98.6
PCO2 ART: 49.9 mmHg — AB (ref 32.0–48.0)
PEEP: 5 cmH2O
PH ART: 7.484 — AB (ref 7.350–7.450)
RATE: 14 resp/min
pO2, Arterial: 65.3 mmHg — ABNORMAL LOW (ref 83.0–108.0)

## 2017-08-15 LAB — GLUCOSE, CAPILLARY
GLUCOSE-CAPILLARY: 107 mg/dL — AB (ref 65–99)
GLUCOSE-CAPILLARY: 117 mg/dL — AB (ref 65–99)
GLUCOSE-CAPILLARY: 97 mg/dL (ref 65–99)
Glucose-Capillary: 101 mg/dL — ABNORMAL HIGH (ref 65–99)
Glucose-Capillary: 103 mg/dL — ABNORMAL HIGH (ref 65–99)
Glucose-Capillary: 104 mg/dL — ABNORMAL HIGH (ref 65–99)
Glucose-Capillary: 115 mg/dL — ABNORMAL HIGH (ref 65–99)

## 2017-08-15 LAB — LACTIC ACID, PLASMA
LACTIC ACID, VENOUS: 0.8 mmol/L (ref 0.5–1.9)
Lactic Acid, Venous: 0.8 mmol/L (ref 0.5–1.9)

## 2017-08-15 LAB — PROTIME-INR
INR: 1.18
Prothrombin Time: 14.9 s (ref 11.4–15.2)

## 2017-08-15 MED ORDER — SODIUM CHLORIDE 0.9 % IV BOLUS (SEPSIS)
250.0000 mL | Freq: Once | INTRAVENOUS | Status: AC
  Administered 2017-08-15: 250 mL via INTRAVENOUS

## 2017-08-15 MED ORDER — WARFARIN SODIUM 5 MG PO TABS
10.0000 mg | ORAL_TABLET | Freq: Once | ORAL | Status: AC
  Administered 2017-08-15: 10 mg via ORAL
  Filled 2017-08-15 (×2): qty 2

## 2017-08-15 MED ORDER — FREE WATER
200.0000 mL | Freq: Three times a day (TID) | Status: DC
  Administered 2017-08-15 – 2017-08-20 (×14): 200 mL

## 2017-08-15 NOTE — Progress Notes (Signed)
[place] TEAM 1 - Stepdown/ICU TEAM  Daytona J Birmingham Jr.  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 Blood cultures sent and started on cefepime 11/26 and amikacin 11/27. Additionally, daughter reported patient normally requires ventilator only at night but had required continously for the preceding two days.   Patient had been at [facility] since February 2018; prior to that he was at [facility].  Hospitalized at [facility] 01/2017 with Proteus bacteremia, sepsis, and hemorrhagic shock.  Additionally has history of Serratia and Pseudomonas from tracheal aspirate in July, both sensitive to Cefepime, as well as Enterobacter and Morganella. 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   Significant Events: 11/26 transfer to [facility] from [facility]  11/28 PEG tube replaced and upsized per IR   Subjective: Trach changed per RT today due to leaking cuff.  Up-sizing of trach was required as his prior trach size was not available.    Assessment & Plan:  Severe Sepsis multiple sources as below - sepsis has resolved   Acute on chronic hypoxic respiratory failure w/ Pseudomonas aeruginosa PNA  Has completed extended course antibiotic per ID (10 days) - clinically much improved   Candida albicans Fungemia  continue antifungals per ID to complete 14 days of tx - ?PICC line related - currently on PICC holiday w functional peripheral IV - Opthal exam 11/30 w/o evidence of endophthalmitis   B Hdyro > R Hydronephrosis > resolved  resolved on f/u US therefore did not require nephrostomy tube - Urology  has evaluated - no further testing planned at this time - UOP remains brisk    R renal collecting system hemorrhage Noted as a possible explanation for R hydro - hydro resolved - no gross hematuria   H/oCRE per [facility]  Chronic systolic CHF TTE 07/68/08 noted EF 40-45% - no signif volume overload on exam - weight stable/improving  Filed Weights   08/13/17 0500 08/14/17 0424 08/15/17 0300  Weight: 80 kg (176 lb 5.9 oz) 77.5 kg (170 lb 13.7 oz) 77.4 kg (170 lb 10.2 oz)    Chronic Atrial Fibrillation Remains in Afib w/ rate controlled - warfarin was being held due to possible acute renal hemorrhage - resumed warfarin w/o overlap initially - INR slow to improve - began lovenox for bridge 12/9  Recent Labs  Lab 08/11/17 0257 08/12/17 0507 08/13/17 0247 08/14/17 0435 08/15/17 0324  INR 1.07 1.08 1.16 1.11 1.18    Acute on CKD stage III - resolved  crt stable in normal range   Recent Labs  Lab 08/10/17 0302 08/11/17 0816 08/13/17 0247 08/14/17 0435 08/15/17 0324  CREATININE 0.73 0.70 0.80 0.70 0.77    Chronic Foley - Neurogenic bladder Care as per Urology recs - consistent urine output   Hypernatremia Corrected w/ free water - Na remains normal  Dysphagia NPO - PEG > malpositioned > replaced by IR - tolerating tube feeds w/o difficulty   Acute blood loss anemia  ?due to R renal hemorrhage but findings not entirely convincing - s/p 3 U PRBC - Hgb holding steady/climbing - follow w/ warfarin dosing   Hypothyroidism  Cont synthroid   Acute  encephalopathy Due to acute illness - resolved    DVT prophylaxis: SQ heparin + warfarin  Code Status: FULL CODE Family Communication: no family present at time of exam  Disposition Plan: SDU due to vent/trach needs - medically stable for d/c back to [facility] when bed available   Consultants:  PCCM Urology  ID  Antimicrobials:  Anidulafungin 11/29 > Ceftaz 11/27 > 12/6  Objective: Blood pressure 113/85, pulse (!)  120, temperature 98.8 F (37.1 C), temperature source Oral, resp. rate 14, height _0  (1.803 m), weight 77.4 kg (170 lb 10.2 oz), SpO2 97 %.  Intake/Output Summary (Last 24 hours) at 08/15/2017 1225 Last data filed at 08/15/2017 0900 Gross per 24 hour  Intake 1210 ml  Output 1995 ml  Net -785 ml   Filed Weights   08/13/17 0500 08/14/17 0424 08/15/17 0300  Weight: 80 kg (176 lb 5.9 oz) 77.5 kg (170 lb 13.7 oz) 77.4 kg (170 lb 10.2 oz)    Examination: General: No acute respiratory distress Lungs: Clear to auscultation B  Cardiovascular: RR Abdomen: NT/ND, soft, bs+ Extremities: No edema LE  CBC: Recent Labs  Lab 08/10/17 0302 08/13/17 0247 08/14/17 0435 08/15/17 0324  WBC 13.1* 11.1* 13.4* 13.3*  HGB 10.0* 9.1* 10.0* 9.2*  HCT 33.3* 31.4* 34.7* 30.7*  MCV 82.6 82.4 84.0 81.6  PLT 249 277 289 809   Basic Metabolic Panel: Recent Labs  Lab 08/10/17 0302 08/11/17 0816 08/13/17 0247 08/14/17 0435 08/15/17 0324  NA 140 133* 139 137 138  K 3.8 3.8 4.5 5.0 4.4  CL 100* 95* 99* 96* 94*  CO2 30 29 35* 32 36*  GLUCOSE 111* 108* 114* 110* 117*  BUN 26* 29* 27* 27* 28*  CREATININE 0.73 0.70 0.80 0.70 0.77  CALCIUM 8.1* 8.2* 8.5* 8.8* 8.9  MG  --  1.4*  --  1.7  --   PHOS  --  2.5  --   --   --    GFR: Estimated Creatinine Clearance: 99.4 mL/min (by C-G formula based on SCr of 0.77 mg/dL).  Liver Function Tests: Recent Labs  Lab 08/10/17 0302 08/11/17 0816 08/13/17 0247  AST _1 ALT 33 23 18  ALKPHOS 226* 209* 170*  BILITOT 0.3 0.4 0.3  PROT 8.3* 8.1 7.7  ALBUMIN 1.9* 1.9* 1.9*    Coagulation Profile: Recent Labs  Lab 08/11/17 0257 08/12/17 0507 08/13/17 0247 08/14/17 0435 08/15/17 0324  INR 1.07 1.08 1.16 1.11 1.18    Recent Results (from the past 240 hour(s))  Culture, blood (Routine X 2) w Reflex to ID Panel     Status: None   Collection Time: 08/06/17 11:40 AM  Result Value Ref Range Status   Specimen Description BLOOD LEFT  ANTECUBITAL  Final   Special Requests   Final    BOTTLES DRAWN AEROBIC AND ANAEROBIC Blood Culture results may not be optimal due to an excessive volume of blood received in culture bottles   Culture NO GROWTH 5 DAYS  Final   Report Status 08/11/2017 FINAL  Final  Culture, blood (Routine X 2) w Reflex to ID Panel     Status: None   Collection Time: 08/06/17 11:41 AM  Result Value Ref Range Status   Specimen Description BLOOD RIGHT HAND  Final   Special Requests   Final    BOTTLES DRAWN AEROBIC AND ANAEROBIC Blood Culture adequate volume   Culture NO GROWTH 5 DAYS  Final   Report Status 08/11/2017 FINAL  Final  Scheduled Meds: . atorvastatin  10 mg Per Tube QHS  . baclofen  5 mg Per Tube BID  . chlorhexidine gluconate (MEDLINE KIT)  15 mL Mouth Rinse BID  . diltiazem  30 mg Per Tube Q6H  . enoxaparin (LOVENOX) injection  80 mg Subcutaneous Q12H  . free water  200 mL Per Tube Q6H  . glycopyrrolate  0.2 mg Intravenous BID  . ipratropium-albuterol  3 mL Nebulization TID  . levothyroxine  125 mcg Per Tube QAC breakfast  . mouth rinse  15 mL Mouth Rinse QID  . metoprolol tartrate  12.5 mg Per Tube BID  . pantoprazole sodium  40 mg Per Tube Daily  . senna-docusate  1 tablet Per Tube QHS  . simethicone  40 mg Per Tube QID  . warfarin  10 mg Oral ONCE-1800  . Warfarin - Pharmacist Dosing Inpatient   Does not apply q1800     LOS: 14 days   Cherene Altes, MD [facility] Office  [phone] Pager - Text Page per Amion as per below:  On-Call/Text Page:      [email or web address]      password [email or web address]  If 7PM-7AM, please contact night-coverage [email or web address] Password [email or web address] 08/15/2017, 12:25 PM

## 2017-08-15 NOTE — Progress Notes (Signed)
On assessment patient was found to have a leak in trach cuff. Patient has #7 portex in place. No #7 portex trachs available at this time. #6 and #8 trach available. [facility]. #8 portex cuffed trach inserted with obturator without difficulty. Color change positive on CO2 detector and BBS auscultated.

## 2017-08-15 NOTE — Plan of Care (Signed)
Patient now place on Mechanical ventilation at HS mandatory.

## 2017-08-15 NOTE — Progress Notes (Signed)
ANTICOAGULATION CONSULT NOTE  Pharmacy Consult:  Coumadin + Lovenox Indication: atrial fibrillation  No Known Allergies  Patient Measurements: Height: 5\' 11"  (180.3 cm) Weight: 170 lb 10.2 oz (77.4 kg) IBW/kg (Calculated) : 75.3  Assessment: 66 YOM who had Afib with RVR 11/26, hx PAF not on AC PTA. Hx of renal hemorrhage but has resolved. Coumadin started 12/5, on therapeutic Lovenox bridge.   INR very slow to trend up - 1.18 this morning, after receiving 10 mg dose last evening. Remains on Lovenox bridge.   Hgb 9.2, plts 245- no overt bleeding noted.  Goal of Therapy:  INR 2-3 Monitor platelets by anticoagulation protocol: Yes   Plan:  -Lovenox 80mg  subQ every 12 hours -Coumadin 10mg  PO x 1 again tonight -Monitor daily INR, CBC, s/s of bleed  Pollyann SamplesAndy Serapio Edelson, PharmD, BCPS 08/15/2017, 10:17 AM

## 2017-08-15 NOTE — Progress Notes (Signed)
Patient weaning on PSV 12/5 since 1207 this shift. Respiratory rate has increased to 33 and HR is 143. Placed patient back on full support (PRVC 14/600/+5/30%). Respiratory rate now 17 and heart rate 123. Will continue to monitor. RN aware.

## 2017-08-15 NOTE — Progress Notes (Signed)
PULMONARY / CRITICAL CARE MEDICINE   Name: Craig Oneal. MRN: [id number] DOB: 28-Apr-1951    ADMISSION DATE:  08/01/2017 CONSULTATION DATE:  08/01/2017  REFERRING MD:  Dr. 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 Blood cultures sent and started on cefepime 11/26 and amikacin 11/27.  Additionally, daughter reports patient normally requires ventilator at night normally but has required continously for the last two days.    Patient has been at [facility] since February 2018; prior to was at [facility].  Hospitalized at [facility] in 01/2017 with proteus bacteremia, septic and hemorrhagic shock.  Additionally has history of serratia and pseudomonas from tracheal aspirate in July, both sensitive to Cefepime, as well as enterobacter and morganella.  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    In ER, initial labs noted for K 6.5, BUN 213, sCr 4.74 (   ), WBC 17, Hgb 8.3, BNP 316, lactic acid 1.2 -> 0.82,  EKG with afib rate 132, CXR with bilateral infiltrates versus edema.  Normotensive in ER.  Treated with sepsis protocol with 2L NS bolus, vancomycin and cefepime given after blood cultures, and lopressor once for HR control.  Patient admitted for further evaluation.     SUBJECTIVE:   Appears comfortable on pressure support ventilation  VITAL SIGNS: BP 113/85   Pulse (!) 120   Temp 98.8 F (37.1 C) (Oral)   Resp 14   Ht 5\' 11"  (1.803 m)   Wt 170 lb 10.2 oz (77.4 kg)   SpO2 97%   BMI 23.80 kg/m     VENTILATOR SETTINGS: Vent Mode: PSV;CPAP FiO2 (%):  [30 %-40 %] 30 % Set Rate:  [14 bmp] 14  bmp Vt Set:  [600 mL] 600 mL PEEP:  [5 cmH20] 5 cmH20 Pressure Support:  [12 cmH20] 12 cmH20 Plateau Pressure:  [20 cmH20-21 cmH20] 20 cmH20  INTAKE / OUTPUT:  Intake/Output Summary (Last 24 hours) at 08/15/2017 1319 Last data filed at 08/15/2017 0900 Gross per 24 hour  Intake 1200 ml  Output 1995 ml  Net -795 ml   PHYSICAL EXAMINATION: General: This is a 66 year old African-American male, is resting comfortably on the ventilator.  In no acute distress on pressure support ventilation. HEENT: Normocephalic atraumatic.  He has a #8 Portex cuffed trach in place, suction scant amount of blood-tinged secretions, had recent trach change Pulmonary: Clear to auscultation with equal chest rise Cardiac: Regular rate and rhythm without murmur rub or gallop Abdomen: Distended, soft, no organomegaly.  PEG tube unremarkable, last BM today. Extremities, contracted, dependent edema Neuro/psych: Awake, interactive, gross motor upper extremity movement.  LABS:  BMET Recent Labs  Lab 08/13/17 0247 08/14/17 0435 08/15/17 0324  NA 139 137 138  K 4.5 5.0 4.4  CL 99* 96* 94*  CO2 35* 32 36*  BUN 27* 27* 28*  CREATININE 0.80 0.70 0.77  GLUCOSE 114* 110* 117*   Electrolytes Recent Labs  Lab 08/11/17 0816 08/13/17 0247 08/14/17 0435 08/15/17 0324  CALCIUM 8.2* 8.5* 8.8* 8.9  MG 1.4*  --  1.7  --   PHOS 2.5  --   --   --  CBC Recent Labs  Lab 08/13/17 0247 08/14/17 0435 08/15/17 0324  WBC 11.1* 13.4* 13.3*  HGB 9.1* 10.0* 9.2*  HCT 31.4* 34.7* 30.7*  PLT 277 289 245   Coag's Recent Labs  Lab 08/13/17 0247 08/14/17 0435 08/15/17 0324  INR 1.16 1.11 1.18   Sepsis Markers Recent Labs  Lab 08/15/17 0324 08/15/17 0552  LATICACIDVEN 0.8 0.8   ABG Recent Labs  Lab 08/14/17 1451 08/15/17 1049  PHART 7.354 7.484*  PCO2ART 72.5* 49.9*  PO2ART 60.4* 65.3*   Liver Enzymes Recent Labs  Lab 08/10/17 0302 08/11/17 0816 08/13/17 0247  AST 24 17 16   ALT 33 23 18   ALKPHOS 226* 209* 170*  BILITOT 0.3 0.4 0.3  ALBUMIN 1.9* 1.9* 1.9*   Cardiac Enzymes No results for input(s): TROPONINI, PROBNP in the last 168 hours.  Glucose Recent Labs  Lab 08/14/17 1607 08/14/17 2000 08/15/17 0017 08/15/17 0329 08/15/17 0900 08/15/17 1140  GLUCAP 105* 106* 97 115* 107* 101*   Imaging No results found. STUDIES:  11/26 CXR >> bilateral infiltrates vs edema 11/27 CXR >> stable bilateral opacities TTE >> obtained 11/27 >> Mild increased left ventricular hypertrophy.  Systolic function was mildly reduced at 40-45% with global hypokinesis and inferior basal akinesis.  The right ventricle was mildly dilated but systolic function normal.  The right atrium was mildly dilated there is moderate tricuspid valve regurg.  There are no echocardiograms on file to compare CT Chest / Abd 11/27 >> Percutaneous gastrostomy tube retracted into the abdominal wall.  Radiologist reports what appears to be mature tract to the gastric lumen given oral contrast injected through catheter did not extravasate.  There is no pneumoperitoneum or fluid collection.  Advanced bilateral hydronephrosis with upper hydroureter.  High density in the right renal collecting system consistent with hemorrhage.  Left greater than right lower lobe airspace disease.  CULTURES: 11/26 BCx 2 >> coag neg staph, candida glabrata  11/26 trach aspirate >> pseudomonas aeruginosa >> S- ceftaz, cipro, zosyn 11/26 UC >> 30k yeast 11/26: blood culture RID: + MRSA  ANTIBIOTICS: ? Prior Amikacin >>11/27 11/26 Cefepime (at [facility]) >> 11/27 11/26 Vancomycin >>11/27 Linezolid 11/27 >> 11/19 Ceftaz/avibactam 11/27 >> 12/1 Anidulofungin 11/30 >> 12/1  Ceftaz 12/1 >>  SIGNIFICANT EVENTS: 11/26 Admit  LINES/TUBES: R PICC from [facility] -  Gastrostomy tube >> Foley>> changed 11/26 >>  I reviewed CXR myself, trach is in good position.  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 -Last chest x-ray obtained 12/5: Showed left basilar volume loss, but overall improvement in comparison to prior film -Last sputum collected 11/26 showing Pseudomonas; has since completed 10 days of antibiotic therapy -Arterial blood gas reviewed demonstrating metabolic alkalosis  -Currently 12.5 L positive since admit -White blood cell count holding around 13, no significant fever spike -Portex changed on 12/10 due to air leak now has size 8 cuffed Portex -Looks comfortable on pressure support ventilation Plan Chest x-ray today, post treatment for pneumonia follow-up Daily attempts at trach collar trials, goal is to return to nocturnal ventilation only Continue  routine tracheostomy care Continue antifungals for fungemia Continue nutritional support Watch for evidence of nosocomial infections: Would repeat sputum and cycle pro calcitonin's if spikes fever   Simonne MartinetPeter E Kaden Dunkel ACNP-BC [facility] Pulmonary/Critical Care Pager # [phone] OR # [phone] if no answer

## 2017-08-15 NOTE — Progress Notes (Signed)
SLP Cancellation Note  Patient Details Name: Craig DiegoCharlie J Gaubert Jr. MRN: [id number] DOB: 1950/10/27   Cancelled treatment:       Reason Eval/Treat Not Completed: Medical issues which prohibited therapy - on the vent this morning. Will f/u when back on TC.   Maxcine Hamaiewonsky, Keidra Withers 08/15/2017, 9:57 AM  Maxcine HamLaura Paiewonsky, M.A. CCC-SLP [phone]

## 2017-08-16 LAB — URINALYSIS, ROUTINE W REFLEX MICROSCOPIC
BILIRUBIN URINE: NEGATIVE
Glucose, UA: NEGATIVE mg/dL
KETONES UR: NEGATIVE mg/dL
Nitrite: NEGATIVE
Protein, ur: 100 mg/dL — AB
SQUAMOUS EPITHELIAL / LPF: NONE SEEN
Specific Gravity, Urine: 1.012 (ref 1.005–1.030)
pH: 7 (ref 5.0–8.0)

## 2017-08-16 LAB — CBC WITH DIFFERENTIAL/PLATELET
BASOS PCT: 0 %
Basophils Absolute: 0 10*3/uL (ref 0.0–0.1)
EOS ABS: 0.1 10*3/uL (ref 0.0–0.7)
Eosinophils Relative: 1 %
HCT: 32.4 % — ABNORMAL LOW (ref 39.0–52.0)
HEMOGLOBIN: 9.8 g/dL — AB (ref 13.0–17.0)
Lymphocytes Relative: 22 %
Lymphs Abs: 2.8 10*3/uL (ref 0.7–4.0)
MCH: 24.4 pg — ABNORMAL LOW (ref 26.0–34.0)
MCHC: 30.2 g/dL (ref 30.0–36.0)
MCV: 80.6 fL (ref 78.0–100.0)
MONOS PCT: 6 %
Monocytes Absolute: 0.8 10*3/uL (ref 0.1–1.0)
NEUTROS PCT: 71 %
Neutro Abs: 9.3 10*3/uL — ABNORMAL HIGH (ref 1.7–7.7)
Platelets: 289 10*3/uL (ref 150–400)
RBC: 4.02 MIL/uL — ABNORMAL LOW (ref 4.22–5.81)
RDW: 18.6 % — AB (ref 11.5–15.5)
WBC: 13.1 10*3/uL — ABNORMAL HIGH (ref 4.0–10.5)

## 2017-08-16 LAB — COMPREHENSIVE METABOLIC PANEL
ALBUMIN: 2.4 g/dL — AB (ref 3.5–5.0)
ALK PHOS: 147 U/L — AB (ref 38–126)
ALT: 14 U/L — ABNORMAL LOW (ref 17–63)
AST: 19 U/L (ref 15–41)
Anion gap: 10 (ref 5–15)
BUN: 30 mg/dL — ABNORMAL HIGH (ref 6–20)
CO2: 32 mmol/L (ref 22–32)
Calcium: 9.1 mg/dL (ref 8.9–10.3)
Chloride: 94 mmol/L — ABNORMAL LOW (ref 101–111)
Creatinine, Ser: 0.86 mg/dL (ref 0.61–1.24)
GFR calc Af Amer: >60 mL/min (ref >60)
GFR calc non Af Amer: >60 mL/min (ref >60)
GLUCOSE: 110 mg/dL — AB (ref 65–99)
POTASSIUM: 4.4 mmol/L (ref 3.5–5.1)
SODIUM: 136 mmol/L (ref 135–145)
Total Bilirubin: 0.4 mg/dL (ref 0.3–1.2)
Total Protein: 8.5 g/dL — ABNORMAL HIGH (ref 6.5–8.1)

## 2017-08-16 LAB — CBC
HCT: 28.7 % — ABNORMAL LOW (ref 39.0–52.0)
Hemoglobin: 8.5 g/dL — ABNORMAL LOW (ref 13.0–17.0)
MCH: 23.9 pg — AB (ref 26.0–34.0)
MCHC: 29.6 g/dL — ABNORMAL LOW (ref 30.0–36.0)
MCV: 80.6 fL (ref 78.0–100.0)
PLATELETS: 255 10*3/uL (ref 150–400)
RBC: 3.56 MIL/uL — AB (ref 4.22–5.81)
RDW: 18.6 % — ABNORMAL HIGH (ref 11.5–15.5)
WBC: 12.4 10*3/uL — AB (ref 4.0–10.5)

## 2017-08-16 LAB — GLUCOSE, CAPILLARY
GLUCOSE-CAPILLARY: 100 mg/dL — AB (ref 65–99)
GLUCOSE-CAPILLARY: 105 mg/dL — AB (ref 65–99)
GLUCOSE-CAPILLARY: 109 mg/dL — AB (ref 65–99)
GLUCOSE-CAPILLARY: 123 mg/dL — AB (ref 65–99)
GLUCOSE-CAPILLARY: 97 mg/dL (ref 65–99)
Glucose-Capillary: 108 mg/dL — ABNORMAL HIGH (ref 65–99)

## 2017-08-16 LAB — PROTIME-INR
INR: 1.25
PROTHROMBIN TIME: 15.5 s — AB (ref 11.4–15.2)

## 2017-08-16 LAB — LACTIC ACID, PLASMA
LACTIC ACID, VENOUS: 0.8 mmol/L (ref 0.5–1.9)
LACTIC ACID, VENOUS: 0.8 mmol/L (ref 0.5–1.9)

## 2017-08-16 MED ORDER — WARFARIN SODIUM 5 MG PO TABS
10.0000 mg | ORAL_TABLET | Freq: Once | ORAL | Status: AC
  Administered 2017-08-16: 10 mg via ORAL
  Filled 2017-08-16: qty 2

## 2017-08-16 MED ORDER — LACTULOSE 10 GM/15ML PO SOLN
20.0000 g | Freq: Three times a day (TID) | ORAL | Status: DC
  Administered 2017-08-16 – 2017-08-19 (×9): 20 g
  Filled 2017-08-16 (×10): qty 30

## 2017-08-16 MED ORDER — POLYETHYLENE GLYCOL 3350 17 G PO PACK
17.0000 g | PACK | Freq: Every day | ORAL | Status: DC
  Administered 2017-08-16 – 2017-08-19 (×4): 17 g
  Filled 2017-08-16 (×5): qty 1

## 2017-08-16 MED ORDER — SODIUM CHLORIDE 0.9 % IV BOLUS (SEPSIS)
500.0000 mL | Freq: Once | INTRAVENOUS | Status: AC
  Administered 2017-08-16: 500 mL via INTRAVENOUS

## 2017-08-16 MED ORDER — ACETAMINOPHEN 325 MG PO TABS
325.0000 mg | ORAL_TABLET | Freq: Once | ORAL | Status: AC
  Administered 2017-08-16: 325 mg
  Filled 2017-08-16: qty 1

## 2017-08-16 NOTE — Progress Notes (Signed)
Craig Oneal TEAM 1 - Stepdown/ICU TEAM  Craig J Polhamus Jr.  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 Blood cultures sent and started on cefepime 11/26 and amikacin 11/27. Additionally, daughter reported patient normally requires ventilator only at night but had required continously for the preceding two days.   Patient had been at [name] since February 2018; prior to that he was at [name].  Hospitalized at [name] 01/2017 with Proteus bacteremia, sepsis, and hemorrhagic shock.  Additionally has history of Serratia and Pseudomonas from tracheal aspirate in July, both sensitive to Cefepime, as well as Enterobacter and Morganella. 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   Significant Events: 11/26 transfer to [facility] from [facility]  11/28 PEG tube replaced and upsized per IR   Subjective: Developed a fever yesterday evening.  Repeat cultures have been sent.  Tells me he feels constipated right now.  Also complains of soreness on his backside.  Denies shortness of breath or chest pain.  Assessment & Plan:  FUO Pt developed fever last night - UA is abnormal but significance in this clinical setting is unclear - follow blood and urine cultures before abx started - send sputum for culture as well - delay d/c to SNF today   Severe Sepsis multiple sources as below - sepsis has resolved   Acute on chronic hypoxic respiratory failure w/ Pseudomonas aeruginosa PNA  Has completed extended course antibiotic per ID (10 days) - clinically much improved - CXR 12/10 stable   Candida  albicans Fungemia  continue antifungals per ID to complete 14 days of tx - ?PICC line related - currently on PICC holiday w functional peripheral IV - Opthal exam 11/30 w/o evidence of endophthalmitis   B Hdyro > R Hydronephrosis > resolved  resolved on f/u US therefore did not require nephrostomy tube - Urology has evaluated - no further testing planned at this time - UOP remains brisk    R renal collecting system hemorrhage Noted as a possible explanation for R hydro - hydro resolved - no gross hematuria   H/oCRE per [facility]  Chronic systolic CHF TTE 23/55/73 noted EF 40-45% - no signif volume overload on exam - weight stable/improving  Filed Weights   08/15/17 0300 08/16/17 0302 08/16/17 0400  Weight: 77.4 kg (170 lb 10.2 oz) 79.5 kg (175 lb 4.3 oz) 77.6 kg (171 lb 1.2 oz)    Chronic Atrial Fibrillation Remains in Afib w/ rate reasonably controlled - warfarin was being held due to possible acute renal hemorrhage - resumed warfarin w/o overlap initially - INR slow to improve - began lovenox for bridge 12/9  Recent Labs  Lab 08/12/17 0507 08/13/17 0247 08/14/17 0435 08/15/17 0324 08/16/17 0327  INR 1.08 1.16 1.11 1.18 1.25    Acute on CKD stage III - resolved  crt stable in normal range   Recent Labs  Lab 08/11/17 0816 08/13/17 0247 08/14/17 0435 08/15/17 0324 08/16/17 0117  CREATININE 0.70 0.80 0.70 0.77 0.86    Chronic Foley - Neurogenic bladder Care as per Urology recs - consistent urine output   Hypernatremia Corrected w/ free water - Na remains normal  Dysphagia NPO - PEG > malpositioned > replaced by IR - tolerating tube feeds w/o difficulty   Acute blood loss anemia  ?due to R renal hemorrhage but findings not entirely convincing - s/p 3 U PRBC - Hgb holding steady/climbing - follow w/ warfarin dosing   Hypothyroidism  Cont synthroid   Acute encephalopathy Due to acute illness - resolved    Constipation  Begin bowel regimen - if not  improved 12/12 will obtain KUB to r/o ileus   DVT prophylaxis: SQ heparin + warfarin  Code Status: FULL CODE Family Communication: no family present at time of exam  Disposition Plan: SDU due to vent/trach needs - delay planned d/c to SNF due to FUO   Consultants:  PCCM Urology  ID  Antimicrobials:  Anidulafungin 11/29 > Ceftaz 11/27 > 12/6  Objective: Blood pressure 110/67, pulse (!) 107, temperature 99.8 F (37.7 C), temperature source Oral, resp. rate (!) 25, height 5' 11"  (1.803 m), weight 77.6 kg (171 lb 1.2 oz), SpO2 100 %.  Intake/Output Summary (Last 24 hours) at 08/16/2017 1400 Last data filed at 08/16/2017 1312 Gross per 24 hour  Intake 3420 ml  Output 2100 ml  Net 1320 ml   Filed Weights   08/15/17 0300 08/16/17 0302 08/16/17 0400  Weight: 77.4 kg (170 lb 10.2 oz) 79.5 kg (175 lb 4.3 oz) 77.6 kg (171 lb 1.2 oz)    Examination: General: No acute respiratory distress - alert and pleasant  Lungs: poor air movement B bases - no wheezing  Cardiovascular: Regular rate - no M or rub  Abdomen: mildly distended, soft, tympanic, mildly tender to diffuse palpation, bs+, no rebound  Extremities: No signif edema LE  CBC: Recent Labs  Lab 08/13/17 0247 08/14/17 0435 08/15/17 0324 08/16/17 0117 08/16/17 0327  WBC 11.1* 13.4* 13.3* 13.1* 12.4*  NEUTROABS  --   --   --  9.3*  --   HGB 9.1* 10.0* 9.2* 9.8* 8.5*  HCT 31.4* 34.7* 30.7* 32.4* 28.7*  MCV 82.4 84.0 81.6 80.6 80.6  PLT 277 289 245 289 001   Basic Metabolic Panel: Recent Labs  Lab 08/11/17 0816 08/13/17 0247 08/14/17 0435 08/15/17 0324 08/16/17 0117  NA 133* 139 137 138 136  K 3.8 4.5 5.0 4.4 4.4  CL 95* 99* 96* 94* 94*  CO2 29 35* 32 36* 32  GLUCOSE 108* 114* 110* 117* 110*  BUN 29* 27* 27* 28* 30*  CREATININE 0.70 0.80 0.70 0.77 0.86  CALCIUM 8.2* 8.5* 8.8* 8.9 9.1  MG 1.4*  --  1.7  --   --   PHOS 2.5  --   --   --   --    GFR: Estimated Creatinine Clearance: 92.4 mL/min (by C-G formula  based on SCr of 0.86 mg/dL).  Liver Function Tests: Recent Labs  Lab 08/10/17 0302 08/11/17 0816 08/13/17 0247 08/16/17 0117  AST 24 17 16 19   ALT 33 23 18 14*  ALKPHOS 226* 209* 170* 147*  BILITOT 0.3 0.4 0.3 0.4  PROT 8.3* 8.1 7.7 8.5*  ALBUMIN 1.9* 1.9* 1.9* 2.4*    Coagulation Profile: Recent Labs  Lab 08/12/17 0507 08/13/17 0247 08/14/17 0435 08/15/17 0324 08/16/17 0327  INR 1.08 1.16 1.11 1.18 1.25     Scheduled Meds: . atorvastatin  10 mg Per Tube QHS  . baclofen  5 mg Per Tube BID  . chlorhexidine gluconate (MEDLINE KIT)  15 mL Mouth Rinse BID  . diltiazem  30 mg Per Tube Q6H  .  enoxaparin (LOVENOX) injection  80 mg Subcutaneous Q12H  . free water  200 mL Per Tube Q8H  . glycopyrrolate  0.2 mg Intravenous BID  . ipratropium-albuterol  3 mL Nebulization TID  . levothyroxine  125 mcg Per Tube QAC breakfast  . mouth rinse  15 mL Mouth Rinse QID  . metoprolol tartrate  12.5 mg Per Tube BID  . pantoprazole sodium  40 mg Per Tube Daily  . senna-docusate  1 tablet Per Tube QHS  . simethicone  40 mg Per Tube QID  . warfarin  10 mg Oral ONCE-1800  . Warfarin - Pharmacist Dosing Inpatient   Does not apply q1800     LOS: 15 days   Cherene Altes, MD [facility] Office  [phone] Pager - Text Page per Amion as per below:  On-Call/Text Page:      [email or web address]      password [email or web address]  If 7PM-7AM, please contact night-coverage [email or web address] Password [email or web address] 08/16/2017, 2:00 PM

## 2017-08-16 NOTE — Care Management Note (Signed)
Case Management Note  Patient Details  Name: Craig DiegoCharlie J Gwin Jr. MRN: [id number] Date of Birth: 02-02-1951  Subjective/Objective:   From [facility], presents with Sepsis, acute/chronic resp failure with pseudomonas pna, candida albicans fungemia, chronic chf.                   Action/Plan: Plant to return back to [facility] when medically ready.   Expected Discharge Date:  08/17/17               Expected Discharge Plan:  [facility]  In-House Referral:  Clinical Social Work  Discharge planning Services  CM Consult  Post Acute Care Choice:    Choice offered to:     DME Arranged:    DME Agency:     HH Arranged:    HH Agency:     Status of Service:  Completed, signed off  If discussed at [employer] Meetings, dates discussed:    Additional Comments:  Leone Havenaylor, Huston Stonehocker Clinton, RN 08/16/2017, 3:21 PM

## 2017-08-16 NOTE — Progress Notes (Signed)
ANTICOAGULATION CONSULT NOTE  Pharmacy Consult:  Coumadin + Lovenox Indication: atrial fibrillation  No Known Allergies  Patient Measurements: Height: 5\' 11"  (180.3 cm) Weight: 171 lb 1.2 oz (77.6 kg) IBW/kg (Calculated) : 75.3  Assessment: 66 YOM who had Afib with RVR 11/26, hx PAF not on AC PTA. Hx of renal hemorrhage but has resolved. Coumadin started 12/5, on therapeutic Lovenox bridge.   INR continues to slowly trend up. Has received 10 mg dose over last 2 days.   Hgb 8.5, plts 255- no overt bleeding noted.  Goal of Therapy:  INR 2-3 Monitor platelets by anticoagulation protocol: Yes   Plan:  -Lovenox 80mg  subQ every 12 hours -Coumadin 10mg  PO x 1 again tonight -Monitor daily INR, CBC, s/s of bleed  Craig SamplesAndy Craig Oneal, PharmD, BCPS 08/16/2017, 8:02 AM

## 2017-08-16 NOTE — Progress Notes (Signed)
Nutrition Follow Up   DOCUMENTATION CODES:   Not applicable  INTERVENTION:    Continue Vital AF 1.2 at goal rate of 70 ml/hr   Provides 2016 kcal, 126 gm protein, 1362 ml free water daily  NUTRITION DIAGNOSIS:   Inadequate oral intake related to inability to eat as evidenced by NPO status, ongoing  GOAL:   Patient will meet greater than or equal to 90% of their needs, met  MONITOR:   TF tolerance, Vent status, Labs, Weight trends, Skin, I & O's  ASSESSMENT:   66 yo male with PMH of chronic trach & vent dependence, PEG, cerebellar atrophy, functional paraplegia, R AKA (1 month PTA), A fib, COPD, HF, and hypothyroidism who was admitted on 11/26 from [facility] with weakness, AMS, AKI, sepsis.  11/28 PEG tube replaced per IR   Patient is currently on ventilator support via trach MV: 10.5 L/min Temp (24hrs), Avg:99.7 F (37.6 C), Min:98.5 F (36.9 C), Max:101.4 F (38.6 C)  Vital AF 1.2 infusing at goal rate of 70 ml/hr via PEG tube. Tolerating well. On daily trach collar trials. Goal is to get pt back on nocturnal vent only. Speech Path following. S/p PMSV evaluation 12/7.  Free water flushes at 200 ml every 8 hours. Labs and medications reviewed.  CBG's 103-100-97.  Diet Order:  Diet NPO time specified  EDUCATION NEEDS:   No education needs have been identified at this time  Skin:  Skin Assessment: Skin Integrity Issues: Skin Integrity Issues:: Stage I, Stage II, Incisions Stage I: sacrum, thigh Stage II: RLE Incisions: RLE  Last BM:  12/10  Intake/Output Summary (Last 24 hours) at 08/16/2017 1239 Last data filed at 08/16/2017 0700 Gross per 24 hour  Intake 3420 ml  Output 1500 ml  Net 1920 ml   Height:   Ht Readings from Last 1 Encounters:  08/14/17 5' 11"  (1.803 m)   Weight:   Wt Readings from Last 1 Encounters:  08/16/17 171 lb 1.2 oz (77.6 kg)   Ideal Body Weight:  71.9 kg  BMI:  27 (adjusted for AKA)  Estimated Nutritional Needs:    Kcal:  2000-2200  Protein:  110-125 gm  Fluid:  2-2.2 L  Arthur Holms, RD, LDN Pager #: [phone] After-Hours Pager #: [phone]

## 2017-08-16 NOTE — Progress Notes (Signed)
Pt with fever of 101.4 and BP of  95/49. Craige CottaKirby NP, notified. New orders given. Will continue to monitor

## 2017-08-16 NOTE — Progress Notes (Signed)
CSW following for return to [facility] when stable- [facility] confirms they have a bed but per MD not stable for DC today- will reevaluate tomorrow  Burna SisJenna H. Amarian Botero, LCSW Clinical Social Worker [phone]

## 2017-08-16 NOTE — Progress Notes (Signed)
SLP Cancellation Note  Patient Details Name: Craig Oneal. MRN: [id number] DOB: 1951/08/04   Cancelled treatment:       Reason Eval/Treat Not Completed: Medical issues which prohibited therapy - pt remains on the vent today and now spiking a fever. Will f/u for PMV trials once he is back on TC. Note that pt had a larger trach placed yesterday - discussed with RN that PMV should be placed by SLP ONLY pending additional assessment with this new trach, as the larger size has the potential to impact his upper airway patency. Will continue to follow along.   Maxcine Hamaiewonsky, Beva Remund 08/16/2017, 10:38 AM  Maxcine HamLaura Paiewonsky, M.A. CCC-SLP [phone]

## 2017-08-17 ENCOUNTER — Inpatient Hospital Stay (HOSPITAL_COMMUNITY): Payer: Medicare Other

## 2017-08-17 DIAGNOSIS — R140 Abdominal distension (gaseous): Secondary | ICD-10-CM

## 2017-08-17 DIAGNOSIS — B952 Enterococcus as the cause of diseases classified elsewhere: Secondary | ICD-10-CM

## 2017-08-17 DIAGNOSIS — N390 Urinary tract infection, site not specified: Secondary | ICD-10-CM

## 2017-08-17 LAB — BLOOD CULTURE ID PANEL (REFLEXED)
ACINETOBACTER BAUMANNII: NOT DETECTED
CANDIDA ALBICANS: NOT DETECTED
CANDIDA PARAPSILOSIS: NOT DETECTED
Candida glabrata: NOT DETECTED
Candida krusei: NOT DETECTED
Candida tropicalis: NOT DETECTED
ENTEROBACTER CLOACAE COMPLEX: NOT DETECTED
ENTEROBACTERIACEAE SPECIES: NOT DETECTED
Enterococcus species: NOT DETECTED
Escherichia coli: NOT DETECTED
HAEMOPHILUS INFLUENZAE: NOT DETECTED
KLEBSIELLA OXYTOCA: NOT DETECTED
Klebsiella pneumoniae: NOT DETECTED
Listeria monocytogenes: NOT DETECTED
METHICILLIN RESISTANCE: DETECTED — AB
Neisseria meningitidis: NOT DETECTED
PSEUDOMONAS AERUGINOSA: NOT DETECTED
Proteus species: NOT DETECTED
STREPTOCOCCUS PNEUMONIAE: NOT DETECTED
Serratia marcescens: NOT DETECTED
Staphylococcus aureus (BCID): NOT DETECTED
Staphylococcus species: DETECTED — AB
Streptococcus agalactiae: NOT DETECTED
Streptococcus pyogenes: NOT DETECTED
Streptococcus species: NOT DETECTED

## 2017-08-17 LAB — COMPREHENSIVE METABOLIC PANEL
ALK PHOS: 138 U/L — AB (ref 38–126)
ALT: 14 U/L — AB (ref 17–63)
ANION GAP: 10 (ref 5–15)
AST: 17 U/L (ref 15–41)
Albumin: 2.3 g/dL — ABNORMAL LOW (ref 3.5–5.0)
BUN: 28 mg/dL — ABNORMAL HIGH (ref 6–20)
CALCIUM: 8.8 mg/dL — AB (ref 8.9–10.3)
CO2: 32 mmol/L (ref 22–32)
CREATININE: 0.82 mg/dL (ref 0.61–1.24)
Chloride: 95 mmol/L — ABNORMAL LOW (ref 101–111)
Glucose, Bld: 121 mg/dL — ABNORMAL HIGH (ref 65–99)
Potassium: 3.8 mmol/L (ref 3.5–5.1)
Sodium: 137 mmol/L (ref 135–145)
TOTAL PROTEIN: 8.3 g/dL — AB (ref 6.5–8.1)
Total Bilirubin: 0.7 mg/dL (ref 0.3–1.2)

## 2017-08-17 LAB — PROTIME-INR
INR: 1.31
Prothrombin Time: 16.2 seconds — ABNORMAL HIGH (ref 11.4–15.2)

## 2017-08-17 LAB — GLUCOSE, CAPILLARY
GLUCOSE-CAPILLARY: 110 mg/dL — AB (ref 65–99)
Glucose-Capillary: 106 mg/dL — ABNORMAL HIGH (ref 65–99)
Glucose-Capillary: 115 mg/dL — ABNORMAL HIGH (ref 65–99)
Glucose-Capillary: 94 mg/dL (ref 65–99)
Glucose-Capillary: 107 mg/dL — ABNORMAL HIGH (ref 65–99)

## 2017-08-17 LAB — CBC
HCT: 30.3 % — ABNORMAL LOW (ref 39.0–52.0)
Hemoglobin: 9 g/dL — ABNORMAL LOW (ref 13.0–17.0)
MCH: 23.7 pg — ABNORMAL LOW (ref 26.0–34.0)
MCHC: 29.7 g/dL — ABNORMAL LOW (ref 30.0–36.0)
MCV: 79.7 fL (ref 78.0–100.0)
PLATELETS: 236 10*3/uL (ref 150–400)
RBC: 3.8 MIL/uL — AB (ref 4.22–5.81)
RDW: 18.2 % — ABNORMAL HIGH (ref 11.5–15.5)
WBC: 12.2 10*3/uL — AB (ref 4.0–10.5)

## 2017-08-17 MED ORDER — WARFARIN SODIUM 5 MG PO TABS
10.0000 mg | ORAL_TABLET | Freq: Once | ORAL | Status: AC
  Administered 2017-08-17: 10 mg via ORAL
  Filled 2017-08-17: qty 2

## 2017-08-17 MED ORDER — SODIUM CHLORIDE 0.9 % IV SOLN
1.0000 g | Freq: Three times a day (TID) | INTRAVENOUS | Status: DC
  Administered 2017-08-17 – 2017-08-18 (×2): 1 g via INTRAVENOUS
  Filled 2017-08-17 (×3): qty 1000

## 2017-08-17 NOTE — Progress Notes (Addendum)
Pt w/ frequent 3beat runs of vtach this AM. getting scheduled Cardizem and Lopressor. Spoke w/ attending MD to relay. will continue to monitor.

## 2017-08-17 NOTE — Progress Notes (Signed)
ANTICOAGULATION CONSULT NOTE  Pharmacy Consult:  Coumadin + Lovenox Indication: atrial fibrillation  No Known Allergies  Patient Measurements: Height: 5\' 11"  (180.3 cm) Weight: 086168 lb 3.4 oz (76.3 kg) IBW/kg (Calculated) : 75.3  Assessment: 66 YOM who had Afib with RVR 11/26, hx PAF not on AC PTA. Hx of renal hemorrhage but has resolved. Coumadin started 12/5, on therapeutic Lovenox bridge.   INR continues to slowly trend up. Has received 10 mg x multiple days  CBC stable  Goal of Therapy:  INR 2-3 Monitor platelets by anticoagulation protocol: Yes   Plan:  -Lovenox 80mg  subQ every 12 hours  -Coumadin 10mg  PO x 1 -Monitor daily INR, CBC, s/s of bleed  Isaac BlissMichael Dannie Hattabaugh, PharmD, BCPS, BCCCP Clinical Pharmacist Clinical phone for 08/17/2017 from 7a-3:30p: [phone] If after 3:30p, please call main pharmacy at: [phone] 08/17/2017 9:34 AM

## 2017-08-17 NOTE — Progress Notes (Signed)
PHARMACY - PHYSICIAN COMMUNICATION CRITICAL VALUE ALERT - BLOOD CULTURE IDENTIFICATION (BCID)  Craig J Toney [name] is an 66 y.o. male who [facility]  Undergoing treatment for fungemia  Name of physician (or Provider) Contacted: Donnamarie PoagK Kirby via text page   Changes to prescribed antibiotics recommended:  No changes needed, likely a contaminant  Results for orders placed or performed during the hospital encounter of 08/01/17  Blood Culture ID Panel (Reflexed) (Collected: 08/16/2017  1:15 AM)  Result Value Ref Range   Enterococcus species NOT DETECTED NOT DETECTED   Listeria monocytogenes NOT DETECTED NOT DETECTED   Staphylococcus species DETECTED (A) NOT DETECTED   Staphylococcus aureus NOT DETECTED NOT DETECTED   Methicillin resistance DETECTED (A) NOT DETECTED   Streptococcus species NOT DETECTED NOT DETECTED   Streptococcus agalactiae NOT DETECTED NOT DETECTED   Streptococcus pneumoniae NOT DETECTED NOT DETECTED   Streptococcus pyogenes NOT DETECTED NOT DETECTED   Acinetobacter baumannii NOT DETECTED NOT DETECTED   Enterobacteriaceae species NOT DETECTED NOT DETECTED   Enterobacter cloacae complex NOT DETECTED NOT DETECTED   Escherichia coli NOT DETECTED NOT DETECTED   Klebsiella oxytoca NOT DETECTED NOT DETECTED   Klebsiella pneumoniae NOT DETECTED NOT DETECTED   Proteus species NOT DETECTED NOT DETECTED   Serratia marcescens NOT DETECTED NOT DETECTED   Haemophilus influenzae NOT DETECTED NOT DETECTED   Neisseria meningitidis NOT DETECTED NOT DETECTED   Pseudomonas aeruginosa NOT DETECTED NOT DETECTED   Candida albicans NOT DETECTED NOT DETECTED   Candida glabrata NOT DETECTED NOT DETECTED   Candida krusei NOT DETECTED NOT DETECTED   Candida parapsilosis NOT DETECTED NOT DETECTED   Candida tropicalis NOT DETECTED NOT DETECTED    Elwin Sleightowell, Geet Hosking Kay 08/17/2017  7:21 PM

## 2017-08-17 NOTE — Progress Notes (Addendum)
PROGRESS NOTE    Craig Oneal.  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 Blood cultures sent and started on cefepime 11/26 and amikacin 11/27.  Additionally, daughter reports patient normally requires ventilator at night normally but has required continously for the last two days.     Patient has been at [facility] since February 2018; prior to was at [facility].  Hospitalized at [facility] in 01/2017 with proteus bacteremia, septic and hemorrhagic shock.  Additionally has history of serratia and pseudomonas from tracheal aspirate in July, both sensitive to Cefepime, as well as enterobacter and morganella.  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      Subjective: 12/12 eyes open, nods yes and no to questions appropriately. Craig Oneal answers appropriately to questions. Negative CP, negative abdominal pain. Quadriplegia. 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 -Frequent suctioning  -Completed antibiotic/antifungal therapy per ID. -Robinul IV BID  -PMV when speech present  Funguria -complete antifungals per ID -See acute on CKD stage III  Hydronephrosis -12/2 per repeat ultrasound on 12/1 hydronephrosis has resolved  H/o CRE per [facility]  Fungemia positive candida glabrata, -Complete course antifungals per ID -Needs optho consult for Endopthalmitis: Will need to obtain as an outpatient.    Chronic systolic CHF -Strict in and out since admission +10.0 L -Daily weight Filed Weights   08/16/17 0302 08/16/17 0400 08/17/17 0500  Weight: 175 lb 4.3 oz (79.5 kg) 171 lb 1.2 oz (77.6 kg) 168 lb 3.4 oz (76.3 kg)  -Cardizem 30 mg QID  -Transfuse if hemoglobin <8  Pulmonary HTN -See CHF  Chronic Atrial Fibrillation -Prior to admission was on warfarin initially held. . -Warfarin restarted on 12/4: Per pharmacy  Recent Labs  Lab 08/12/17 0507 08/13/17 0247 08/14/17 0435 08/15/17 0324 08/16/17 0327 08/17/17 0501  INR 1.08 1.16 1.11 1.18 1.25 1.31  -Currently subtherapeutic   Acute on CKD stage III  -Patient was chronic Foley -RIGHT renal collecting system hemorrhage -Secondary to bilateral hydronephrosis, bacteremia. Per urology note appears   -Per urology believed source of bacteremia (Candida Glabrata from GU tract specifically RIGHT kidney).  -12/2 per Dr. Irine Oneal Urology noted after further evaluation no requirement for RIGHT Nephrostomy tube placement.  -Restart tube feeds  Hypernatremia -Resolved: Mildly hyponatremic: Change maintenance fluid to D5-0.9% saline  Hypokalemia  -Potassium goal > 4  Hypomagnesemia -  Magnesium goal> 2 -Magnesium IV 2 g  Chronic foley  Cholecystitis? -Per renal ultrasound patient with possible  cholecystitis given patient's quadriplegia and inability to communicate will obtain RUQ abdominal ultrasound per radiology recommendation -Abdominal ultrasound inconclusive for cholecystitis. Patient clinically improving, continue to monitor liver enzymes and WBC.  Dysphagia as late effect of CVA -NPO -Gtube-->malpositioned > replaced by IR -Transaminitis - appears chronic based on May 2018 labs -12/2 Restart vital AF 1.2 CAL 70 ml/hr -12/6 placed consult to speech for cognitive language evaluation.   Acute blood loss anemia  -presumably from area noted on CT scan from right renal collecting system noting concern for hemorrhage -11/27 transfused 2 units PRBC -12/1 transfuse 1 unit PRBC  -Transfuse for hemoglobin<8 Recent Labs  Lab 08/13/17 0247 08/14/17 0435 08/15/17 0324 08/16/17 0117 08/16/17 0327 08/17/17 0501  HGB 9.1* 10.0* 9.2* 9.8* 8.5* 9.0*      Hypothyroidism  -Synthroid 125 g daily   Acute encephalopathy - Infection/sepsis ? -Hx cerebral atrophy, paraplegia w/ataxia -Improved, patient not having yes and no to questions appropriately.  Right AKA -Review of EMR does not show exact date of surgery or who performed surgery.  -Patient states had surgery 12 moments ago. -Upon exam today staples have been removed.   Abdominal distention -Patient with his increasing abdominal distention with hypoactive to absent bowel sounds, obtain KUB    DVT prophylaxis: Warfarin per pharmacy Code Status: Full Family Communication: None Disposition Plan: TBD   Consultants:  [facility] ID     Procedures/Significant Events:   STUDIES:  11/26 CXR >> bilateral infiltrates vs edema 11/27 CXR >> stable bilateral opacities 11/27 Echocardiogram :  LVEF = 40-45% with global hypokinesis and inferior basal akinesis.   -RIGHT Atrium: moderate tricuspid valve regurg.  -Tricuspid valve: Moderate regurgitation -Pulmonary arteries PA peak pressure 51 mmHg   11/27 CT chest  and abdomen : Percutaneous gastrostomy tube retracted into the abdominal wall.  Radiologist reports what appears to be mature tract to the gastric lumen given oral contrast injected through catheter did not extravasate.  There is no pneumoperitoneum or fluid collection.  Advanced bilateral hydronephrosis with upper hydroureter.  High density in the right renal collecting system consistent with hemorrhage.  Left greater than right lower lobe airspace disease. 11/29 CT abdomen pelvis W contrast:-Gastrostomy tube appears be in good position w/ocomplicating features. - Small bilateral pleural effusions and bibasilar atelectasis. -Cholelithiasis without definite CT findings for acute cholecystitis. - Right-sided hydroureteronephrosis without obvious cause.  12/1 Renal ultrasound:-Cholelithiasis. -Gallbladder wall is thickened to 5 mm. Dedicated ultrasound of the gallbladder and/or a HIDA scan may be helpful if there is concern for acute cholecystitis. -Previously identified right-sided hydronephrosis has almost completely resolved.  -Left renal cysts, incompletely characterized. 12/2 RUQ abdominal ultrasound:- A few small stones and some sludge are seen in the gallbladder with wall thickening. -No pericholecystic fluid.  -A Murphy's sign cannot be assessed as the patient is unresponsive.  -A HIDA scan could further evaluate for cystic duct patency/acute cholecystitis as clinically warranted. -The common bile duct measures 6 mm which is borderline with no significant dilatation.        I have personally reviewed and interpreted all radiology studies and my findings are as above.  VENTILATOR SETTINGS: Trach collar FiO2: 28%   Cultures Prior cultures at [facility] 11/23 ? 11/26 blood 2/2 positive coag negative staph  11/26 blood positive Candida Glabrata 11/26 Trach Aspirate: Positive Pseudomonas Aeruginosa  11/26 UC positive 30,000 colonies yeast 11/27 MRSA by  PCR negative  11/28 blood  positive yeast 12/1 blood pending 12/11 blood 1/2 positive quite negative staph possible contaminant 12/11 tracheal aspirate pending 12/11 urine positive Enterococcus Faecium     Antimicrobials: Anti-infectives (From admission, onward)   Start     Stop   08/06/17 2200  ceftazidime-avibactam (AVYCAZ) 2.5 g in dextrose 5 % 50 mL IVPB  Status:  Discontinued     08/06/17 1547   08/06/17 1700  cefTAZidime (FORTAZ) 2 g in dextrose 5 % 50 mL IVPB     08/11/17 1717   08/06/17 0400  anidulafungin (ERAXIS) 100 mg in sodium chloride 0.9 % 100 mL IVPB     08/19/17 2359   08/05/17 0400  anidulafungin (ERAXIS) 200 mg in sodium chloride 0.9 % 200 mL IVPB     08/05/17 0838   08/04/17 1400  ceftazidime-avibactam (AVYCAZ) 1.25 g in dextrose 5 % 50 mL IVPB  Status:  Discontinued     08/06/17 1431   08/02/17 2100  vancomycin (VANCOCIN) IVPB 750 mg/150 ml premix  Status:  Discontinued     08/02/17 1630   08/02/17 2100  ceFEPIme (MAXIPIME) 1 g in dextrose 5 % 50 mL IVPB  Status:  Discontinued     08/02/17 1501   08/02/17 1800  linezolid (ZYVOX) IVPB 600 mg  Status:  Discontinued     08/04/17 1452   08/02/17 1600  ceftazidime-avibactam (AVYCAZ) 0.94 g in dextrose 5 % 50 mL IVPB  Status:  Discontinued     08/04/17 0838   08/01/17 1830  ceFEPIme (MAXIPIME) 2 g in dextrose 5 % 50 mL IVPB     08/01/17 2045   08/01/17 1745  vancomycin (VANCOCIN) 1,500 mg in sodium chloride 0.9 % 500 mL IVPB     08/01/17 2242   08/01/17 1730  piperacillin-tazobactam (ZOSYN) IVPB 3.375 g  Status:  Discontinued     08/01/17 1818   08/01/17 1730  vancomycin (VANCOCIN) IVPB 1000 mg/200 mL premix  Status:  Discontinued     08/01/17 1739       Devices   LINES / TUBES:  RIGHT PICC from [facility] -  Gastrostomy tube   Foley>> changed 11/26 >>  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 anidulafungin Stopped (08/17/17 0456)  . feeding supplement (VITAL AF 1.2 CAL) 1,000 mL (08/17/17 0100)      Objective: Vitals:   08/17/17 0800 08/17/17 0806 08/17/17 0853 08/17/17 0858  BP: (!) 99/54 (!) 99/54    Pulse: (!) 102 79  (!) 108  Resp: 15 16  (!) 30  Temp:  (!) 102.4 F (39.1 C)    TempSrc:  Oral    SpO2: 99% 99% 98% 98%  Weight:      Height:        Intake/Output Summary (Last 24 hours) at 08/17/2017 0919 Last data filed at 08/17/2017 0730 Gross per 24 hour  Intake 2340 ml  Output 2225 ml  Net 115 ml   Filed Weights   08/16/17 0302 08/16/17 0400 08/17/17 0500  Weight: 175 lb 4.3 oz (79.5 kg) 171 lb 1.2 oz (77.6 kg) 168 lb 3.4 oz (76.3 kg)    Physical Exam:  General: Eyes open alert, nods yes and no appropriate questions, positive acute respiratory distress. (Currently on trach collar)  Neck:  Negative scars, masses, torticollis, lymphadenopathy, JVD, #7 cuffed trach in place covered and clean, negative sign of infection, negative secretions from trach. Lungs: Clear to auscultation bilaterally without wheezes or crackles Cardiovascular: Regular rate  and rhythm without murmur gallop or rub normal S1 and S2 Abdomen: negative abdominal pain, positive distention/firm, hypoactive to absent bowel sounds, no rebound, no ascites, no appreciable mass, PEG tube in place, covered and clean negative sign of infection Extremities: No significant cyanosis, clubbing, or edema LLE. RIGHT AKA healing well, negative sign of infection Psychiatric:  Unable to assess secondary to patient being trached  Central nervous system:  Quadriplegia, sensation intact grimaces to painful stimuli. negative receptive aphasia. Mouths answers to simple questions     .     Data Reviewed: Care during the described time interval was provided by me .  I have reviewed this patient's available data, including medical history, events of note, physical examination, and all test results as part of my evaluation.   CBC: Recent Labs  Lab 08/14/17 0435 08/15/17 0324 08/16/17 0117 08/16/17 0327  08/17/17 0501  WBC 13.4* 13.3* 13.1* 12.4* 12.2*  NEUTROABS  --   --  9.3*  --   --   HGB 10.0* 9.2* 9.8* 8.5* 9.0*  HCT 34.7* 30.7* 32.4* 28.7* 30.3*  MCV 84.0 81.6 80.6 80.6 79.7  PLT 289 245 289 255 947   Basic Metabolic Panel: Recent Labs  Lab 08/11/17 0816 08/13/17 0247 08/14/17 0435 08/15/17 0324 08/16/17 0117 08/17/17 0501  NA 133* 139 137 138 136 137  K 3.8 4.5 5.0 4.4 4.4 3.8  CL 95* 99* 96* 94* 94* 95*  CO2 29 35* 32 36* 32 32  GLUCOSE 108* 114* 110* 117* 110* 121*  BUN 29* 27* 27* 28* 30* 28*  CREATININE 0.70 0.80 0.70 0.77 0.86 0.82  CALCIUM 8.2* 8.5* 8.8* 8.9 9.1 8.8*  MG 1.4*  --  1.7  --   --   --   PHOS 2.5  --   --   --   --   --    GFR: Estimated Creatinine Clearance: 96.9 mL/min (by C-G formula based on SCr of 0.82 mg/dL). Liver Function Tests: Recent Labs  Lab 08/11/17 0816 08/13/17 0247 08/16/17 0117 08/17/17 0501  AST _0 ALT 23 18 14* 14*  ALKPHOS 209* 170* 147* 138*  BILITOT 0.4 0.3 0.4 0.7  PROT 8.1 7.7 8.5* 8.3*  ALBUMIN 1.9* 1.9* 2.4* 2.3*   No results for input(s): LIPASE, AMYLASE in the last 168 hours. No results for input(s): AMMONIA in the last 168 hours. Coagulation Profile: Recent Labs  Lab 08/13/17 0247 08/14/17 0435 08/15/17 0324 08/16/17 0327 08/17/17 0501  INR 1.16 1.11 1.18 1.25 1.31   Cardiac Enzymes: No results for input(s): CKTOTAL, CKMB, CKMBINDEX, TROPONINI in the last 168 hours. BNP (last 3 results) No results for input(s): PROBNP in the last 8760 hours. HbA1C: No results for input(s): HGBA1C in the last 72 hours. CBG: Recent Labs  Lab 08/16/17 1603 08/16/17 1932 08/16/17 2348 08/17/17 0316 08/17/17 0804  GLUCAP 105* 109* 123* 106* 115*   Lipid Profile: No results for input(s): CHOL, HDL, LDLCALC, TRIG, CHOLHDL, LDLDIRECT in the last 72 hours. Thyroid Function Tests: No results for input(s): TSH, T4TOTAL, FREET4, T3FREE, THYROIDAB in the last 72 hours. Anemia Panel: No results for  input(s): VITAMINB12, FOLATE, FERRITIN, TIBC, IRON, RETICCTPCT in the last 72 hours. Urine analysis:    Component Value Date/Time   COLORURINE YELLOW 08/16/2017 0331   APPEARANCEUR TURBID (A) 08/16/2017 0331   LABSPEC 1.012 08/16/2017 0331   PHURINE 7.0 08/16/2017 0331   GLUCOSEU NEGATIVE 08/16/2017 0331   HGBUR MODERATE (A) 08/16/2017 0331  BILIRUBINUR NEGATIVE 08/16/2017 [place] 08/16/2017 0331   PROTEINUR 100 (A) 08/16/2017 0331   NITRITE NEGATIVE 08/16/2017 0331   LEUKOCYTESUR LARGE (A) 08/16/2017 0331   Sepsis Labs: _0 (procalcitonin:4,lacticidven:4)  ) Recent Results (from the past 240 hour(s))  Culture, respiratory (NON-Expectorated)     Status: None (Preliminary result)   Collection Time: 08/16/17 11:30 PM  Result Value Ref Range Status   Specimen Description TRACHEAL ASPIRATE  Final   Special Requests Normal  Final   Gram Stain   Final    MODERATE WBC PRESENT,BOTH PMN AND MONONUCLEAR ABUNDANT GRAM NEGATIVE RODS MODERATE GRAM POSITIVE RODS RARE GRAM POSITIVE COCCI    Culture PENDING  Incomplete   Report Status PENDING  Incomplete         Radiology Studies: Dg Chest Port 1 View  Result Date: 08/15/2017 CLINICAL DATA:  66 year old male with a history of pneumonia EXAM: PORTABLE CHEST 1 VIEW COMPARISON:  Multiple prior chest x-ray, most recent 08/10/2017. Abdominal CT 08/04/2017 FINDINGS: Cardiomediastinal silhouette unchanged in size and contour. Mild interlobular septal thickening. Unchanged tracheostomy tube. Blunting of the bilateral costophrenic angles with pleuroparenchymal thickening at the lung base on the left. Similar patchy opacities in the bilateral lung bases. No pneumothorax. No displaced fracture IMPRESSION: Similar appearance of the chest x-ray, with mild airspace disease and evidence of bilateral small pleural effusions. Unchanged tracheostomy Electronically Signed   By: Corrie Mckusick D.O.   On: 08/15/2017 14:13         Scheduled Meds: . atorvastatin  10 mg Per Tube QHS  . baclofen  5 mg Per Tube BID  . chlorhexidine gluconate (MEDLINE KIT)  15 mL Mouth Rinse BID  . diltiazem  30 mg Per Tube Q6H  . enoxaparin (LOVENOX) injection  80 mg Subcutaneous Q12H  . free water  200 mL Per Tube Q8H  . glycopyrrolate  0.2 mg Intravenous BID  . ipratropium-albuterol  3 mL Nebulization TID  . lactulose  20 g Per Tube TID  . levothyroxine  125 mcg Per Tube QAC breakfast  . mouth rinse  15 mL Mouth Rinse QID  . metoprolol tartrate  12.5 mg Per Tube BID  . pantoprazole sodium  40 mg Per Tube Daily  . polyethylene glycol  17 g Per Tube Daily  . senna-docusate  1 tablet Per Tube QHS  . simethicone  40 mg Per Tube QID  . Warfarin - Pharmacist Dosing Inpatient   Does not apply q1800   Continuous Infusions: . anidulafungin Stopped (08/17/17 0456)  . feeding supplement (VITAL AF 1.2 CAL) 1,000 mL (08/17/17 0100)     LOS: 16 days    Time spent: 40 minutes    Simisola Sandles, Geraldo Docker, MD [facility] Pager [phone]   If 7PM-7AM, please contact night-coverage [email or web address] Password [email or web address] 08/17/2017, 9:19 AM

## 2017-08-18 DIAGNOSIS — Z228 Carrier of other infectious diseases: Secondary | ICD-10-CM

## 2017-08-18 DIAGNOSIS — J151 Pneumonia due to Pseudomonas: Secondary | ICD-10-CM

## 2017-08-18 DIAGNOSIS — S3121XA Laceration without foreign body of penis, initial encounter: Secondary | ICD-10-CM

## 2017-08-18 DIAGNOSIS — A491 Streptococcal infection, unspecified site: Secondary | ICD-10-CM

## 2017-08-18 DIAGNOSIS — Z1621 Resistance to vancomycin: Secondary | ICD-10-CM

## 2017-08-18 DIAGNOSIS — L89312 Pressure ulcer of right buttock, stage 2: Secondary | ICD-10-CM

## 2017-08-18 LAB — CBC WITH DIFFERENTIAL/PLATELET
BASOS ABS: 0 10*3/uL (ref 0.0–0.1)
Basophils Relative: 0 %
EOS PCT: 2 %
Eosinophils Absolute: 0.2 10*3/uL (ref 0.0–0.7)
HEMATOCRIT: 28.5 % — AB (ref 39.0–52.0)
Hemoglobin: 8.3 g/dL — ABNORMAL LOW (ref 13.0–17.0)
LYMPHS ABS: 1 10*3/uL (ref 0.7–4.0)
LYMPHS PCT: 8 %
MCH: 23.2 pg — AB (ref 26.0–34.0)
MCHC: 29.1 g/dL — ABNORMAL LOW (ref 30.0–36.0)
MCV: 79.8 fL (ref 78.0–100.0)
MONO ABS: 1.1 10*3/uL — AB (ref 0.1–1.0)
Monocytes Relative: 8 %
NEUTROS ABS: 10.9 10*3/uL — AB (ref 1.7–7.7)
Neutrophils Relative %: 82 %
Platelets: 209 10*3/uL (ref 150–400)
RBC: 3.57 MIL/uL — AB (ref 4.22–5.81)
RDW: 17.8 % — AB (ref 11.5–15.5)
WBC: 13.2 10*3/uL — ABNORMAL HIGH (ref 4.0–10.5)

## 2017-08-18 LAB — GLUCOSE, CAPILLARY
GLUCOSE-CAPILLARY: 93 mg/dL (ref 65–99)
GLUCOSE-CAPILLARY: 84 mg/dL (ref 65–99)
GLUCOSE-CAPILLARY: 104 mg/dL — AB (ref 65–99)
Glucose-Capillary: 115 mg/dL — ABNORMAL HIGH (ref 65–99)
Glucose-Capillary: 93 mg/dL (ref 65–99)
Glucose-Capillary: 98 mg/dL (ref 65–99)

## 2017-08-18 LAB — URINE CULTURE

## 2017-08-18 LAB — CULTURE, BLOOD (ROUTINE X 2): SPECIAL REQUESTS: ADEQUATE

## 2017-08-18 LAB — BASIC METABOLIC PANEL
Anion gap: 10 (ref 5–15)
BUN: 31 mg/dL — AB (ref 6–20)
CHLORIDE: 95 mmol/L — AB (ref 101–111)
CO2: 32 mmol/L (ref 22–32)
CREATININE: 0.82 mg/dL (ref 0.61–1.24)
Calcium: 8.6 mg/dL — ABNORMAL LOW (ref 8.9–10.3)
GFR calc Af Amer: >60 mL/min (ref >60)
GFR calc non Af Amer: >60 mL/min (ref >60)
Glucose, Bld: 123 mg/dL — ABNORMAL HIGH (ref 65–99)
POTASSIUM: 3.8 mmol/L (ref 3.5–5.1)
SODIUM: 137 mmol/L (ref 135–145)

## 2017-08-18 LAB — MAGNESIUM: MAGNESIUM: 1.9 mg/dL (ref 1.7–2.4)

## 2017-08-18 LAB — PROTIME-INR
INR: 1.31
PROTHROMBIN TIME: 16.2 s — AB (ref 11.4–15.2)

## 2017-08-18 LAB — LACTIC ACID, PLASMA: Lactic Acid, Venous: 0.7 mmol/L (ref 0.5–1.9)

## 2017-08-18 MED ORDER — LINEZOLID 600 MG/300ML IV SOLN
600.0000 mg | Freq: Two times a day (BID) | INTRAVENOUS | Status: DC
  Administered 2017-08-18 – 2017-08-20 (×5): 600 mg via INTRAVENOUS
  Filled 2017-08-18 (×6): qty 300

## 2017-08-18 MED ORDER — WARFARIN SODIUM 5 MG PO TABS
15.0000 mg | ORAL_TABLET | Freq: Once | ORAL | Status: DC
  Filled 2017-08-18: qty 3

## 2017-08-18 MED ORDER — COLLAGENASE 250 UNIT/GM EX OINT
TOPICAL_OINTMENT | Freq: Every day | CUTANEOUS | Status: DC
  Administered 2017-08-19 – 2017-08-20 (×2): via TOPICAL
  Filled 2017-08-18: qty 30

## 2017-08-18 NOTE — Progress Notes (Signed)
  Speech Language Pathology Treatment: Craig Oneal Speaking valve  Patient Details Name: Craig DiegoCharlie J Fusco Jr. MRN: [id number] DOB: 1950/10/05 Today's Date: 08/18/2017 Time: [id number] SLP Time Calculation (min) (ACUTE ONLY): 21 min  Assessment / Plan / Recommendation Clinical Impression  Pt tolerated cuff deflation with oral expectoration of moderate amounts of secretions, mostly thin. Although he is dysarthric his vocal quality is adequate for communication. He wore the valve for almost 20 minutes with no overt signs of air trapping or distress. Given increased secretions, recommend to wear PMV when full supervision can be provided.   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 Pt says that he has been working with SLP at [facility] and wears the PMV during waking hours.      SLP Plan  Continue with current plan of care       Recommendations         Patient may use Passy-Oneal Speech Valve: Intermittently with supervision;During all therapies with supervision PMSV Supervision: Full         Follow up Recommendations: LTACH;Skilled Nursing facility SLP Visit Diagnosis: Aphonia (R49.1) Plan: Continue with current plan of care       GO                Craig Oneal, Craig Oneal 08/18/2017, 12:47 PM  Craig Oneal, Craig Oneal [phone]

## 2017-08-18 NOTE — Consult Note (Addendum)
WOC Nurse wound consult note Reason for Consult: Consult requested for buttocks, sacrum, and penis.  WOC consult was previously performed on 11/27, refer to progress notes. Pt is frequently incontinent of stool and it is difficult to keep wounds from becoming soiled; Flexiseal has been inserted to attempt to contain stool, but still leaks around the insertion site. Wound type: Sacrum with previously noted stage 1 has evolved into a stage 2 pressure injury; .8X.8X.2cm, pink and moist.   Right buttock with previously noted moisture associated skin damage has evolved into an unstageable pressure injury; 2X1cm, 50% pink, 50% yellow slough, no odor, small amt yellow drainage End of penis has a full thickness split; 1X.2cm.   Pressure Injury POA: Yes Dressing procedure/placement/frequency: Foam dressing to protect and promote healing to sacrum.  Pt is on a low air loss bed to reduce pressure.  Santyl ointment to provide enzymatic debridement of nonviable tissue to right buttock. Xeroform gauze to tip of penis; there is a  full thickness split on the end of his penis; Topical treatment will not be effective to promote healing.  Please consult urology for further plan of care if desired.   Please re-consult if further assistance is needed.  Thank-you,  Cammie Mcgeeawn Maximilien Hayashi MSN, RN, CWOCN, [place]CN-AP, CNS [phone]

## 2017-08-18 NOTE — Progress Notes (Signed)
ANTICOAGULATION CONSULT NOTE  Pharmacy Consult:  Coumadin + Lovenox Indication: atrial fibrillation  No Known Allergies  Patient Measurements: Height: 5\' 11"  (180.3 cm) Weight: 171 lb 1.2 oz (77.6 kg) IBW/kg (Calculated) : 75.3  Assessment: 66 YOM who had Afib with RVR 11/26, hx PAF not on AC PTA. Hx of renal hemorrhage but has resolved. Coumadin started 12/5, on therapeutic Lovenox bridge.   INR continues to slowly respond after receiving warfarin 10 mg x multiple days.   CBC stable  Goal of Therapy:  INR 2-3 Monitor platelets by anticoagulation protocol: Yes   Plan:  -Lovenox 80mg  subQ every 12 hours  -Coumadin 15 mg PO x 1 -Monitor daily INR, CBC, s/s of bleed  Craig SamplesAndy Grayce Oneal, PharmD, BCPS Clinical phone for 08/18/2017 from 7a-3:30p: [phone] If after 3:30p, please call main pharmacy at: [phone] 08/18/2017 8:23 AM

## 2017-08-18 NOTE — Progress Notes (Signed)
PROGRESS NOTE    Craig Oneal.  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 Blood cultures sent and started on cefepime 11/26 and amikacin 11/27.  Additionally, daughter reports patient normally requires ventilator at night normally but has required continously for the last two days.     Patient has been at [facility] since February 2018; prior to was at [facility].  Hospitalized at [facility] in 01/2017 with proteus bacteremia, septic and hemorrhagic shock.  Additionally has history of serratia and pseudomonas from tracheal aspirate in July, both sensitive to Cefepime, as well as enterobacter and morganella.  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      Subjective: 12/13 [name], increasing leukocytosis, hypotensive.            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 -Frequent suctioning  -Completed antibiotic/antifungal therapy per ID. -Robinul IV BID  -PMV when speech present  Funguria -complete antifungals per ID -See acute on CKD stage III  Hydronephrosis -12/2 per repeat ultrasound on 12/1 hydronephrosis has resolved  H/o CRE per [facility]  Fungemia positive candida glabrata, -Complete course antifungals per ID -Needs optho consult for Endopthalmitis: Will need to obtain as an outpatient.    Chronic systolic CHF -Strict in and out since admission +10.0 L -Daily weight Filed Weights   08/17/17 0500 08/18/17 0017 08/18/17 0418  Weight: 168 lb 3.4 oz (76.3 kg) 169 lb 5 oz (76.8 kg) 171 lb 1.2 oz (77.6 kg)  -Cardizem 30 mg QID  -Transfuse if hemoglobin <8  Pulmonary HTN -See CHF  Chronic Atrial Fibrillation -Prior to admission was on warfarin initially held. . -Warfarin restarted on 12/4: Per pharmacy  Recent Labs  Lab 08/13/17 0247 08/14/17 0435 08/15/17 0324 08/16/17 0327 08/17/17 0501 08/18/17 0559  INR 1.16 1.11 1.18 1.25 1.31 1.31  -Currently subtherapeutic   Acute on CKD stage III  -Patient was chronic Foley -RIGHT renal collecting system hemorrhage -Secondary to bilateral hydronephrosis, bacteremia. Per urology note appears   -Per urology believed source of bacteremia (Candida Glabrata from GU tract specifically RIGHT kidney).  -12/2 per Dr. Irine Seal Urology noted after further evaluation no requirement for RIGHT Nephrostomy tube placement.  -Restart tube feeds  Hypernatremia -Resolved: Mildly hyponatremic: Change maintenance fluid to D5-0.9% saline  Hypokalemia  -Potassium goal > 4  Hypomagnesemia -Magnesium goal> 2 -Magnesium IV 2 g  Chronic foley  Cholecystitis? -Per renal ultrasound patient with possible cholecystitis given patient's quadriplegia and inability to communicate will obtain RUQ abdominal ultrasound  per radiology recommendation -Abdominal ultrasound inconclusive for cholecystitis. Patient clinically improving, continue to monitor liver enzymes and WBC.  Dysphagia as late effect of CVA -NPO -Gtube-->malpositioned > replaced by IR -Transaminitis - appears chronic based on May 2018 labs -12/2 Restart vital AF 1.2 CAL 70 ml/hr -12/6 placed consult to speech for cognitive language evaluation.   Acute blood loss anemia  -presumably from area noted on CT scan from right renal collecting system noting concern for hemorrhage -11/27 transfused 2 units PRBC -12/1 transfuse 1 unit PRBC  -Transfuse for hemoglobin<8 Recent Labs  Lab 08/14/17 0435 08/15/17 0324 08/16/17 0117 08/16/17 0327 08/17/17 0501 08/18/17 0804  HGB 10.0* 9.2* 9.8* 8.5* 9.0* 8.3*      Hypothyroidism  -Synthroid 125 g daily   Acute encephalopathy - Infection/sepsis ? -Hx cerebral atrophy, paraplegia w/ataxia -Improved, patient not having yes and no to questions appropriately.  Right AKA -Review of EMR does not show exact date of surgery or who performed surgery.  -Patient states had surgery 12 moments ago. -Upon exam today staples have been removed.   Abdominal distention -Patient with his increasing abdominal distention with hypoactive to absent bowel sounds, obtain KUB  RIGHT Buttocks stage II Decubitus ulcer/Penile [phone] -12/13 WOC consult placed     DVT prophylaxis: Warfarin per pharmacy Code Status: Full Family Communication: None Disposition Plan: TBD   Consultants:  [facility] ID     Procedures/Significant Events:   STUDIES:  11/26 CXR >> bilateral infiltrates vs edema 11/27 CXR >> stable bilateral opacities 11/27 Echocardiogram :  LVEF = 40-45% with global hypokinesis and inferior basal akinesis.   -RIGHT Atrium: moderate tricuspid valve regurg.  -Tricuspid valve: Moderate regurgitation -Pulmonary arteries PA peak pressure 51 mmHg   11/27 CT chest and abdomen :  Percutaneous gastrostomy tube retracted into the abdominal wall.  Radiologist reports what appears to be mature tract to the gastric lumen given oral contrast injected through catheter did not extravasate.  There is no pneumoperitoneum or fluid collection.  Advanced bilateral hydronephrosis with upper hydroureter.  High density in the right renal collecting system consistent with hemorrhage.  Left greater than right lower lobe airspace disease. 11/29 CT abdomen pelvis W contrast:-Gastrostomy tube appears be in good position w/ocomplicating features. - Small bilateral pleural effusions and bibasilar atelectasis. -Cholelithiasis without definite CT findings for acute cholecystitis. - Right-sided hydroureteronephrosis without obvious cause.  12/1 Renal ultrasound:-Cholelithiasis. -Gallbladder wall is thickened to 5 mm. Dedicated ultrasound of the gallbladder and/or a HIDA scan may be helpful if there is concern for acute cholecystitis. -Previously identified right-sided hydronephrosis has almost completely resolved.  -Left renal cysts, incompletely characterized. 12/2 RUQ abdominal ultrasound:- A few small stones and some sludge are seen in the gallbladder with wall thickening. -No pericholecystic fluid.  -A Murphy's sign cannot be assessed as the patient is unresponsive.  -A HIDA scan could further evaluate for cystic duct patency/acute cholecystitis as clinically warranted. -The common bile duct measures 6 mm which is borderline with no significant dilatation.        I have personally reviewed and interpreted all radiology studies and my findings are as above.  VENTILATOR SETTINGS: Trach collar FiO2: 28%   Cultures Prior cultures at [facility] 11/23 ? 11/26 blood 2/2 positive coag negative staph  11/26 blood positive Candida Glabrata 11/26 Trach Aspirate: Positive Pseudomonas Aeruginosa  11/26 UC positive 30,000 colonies yeast 11/27  MRSA by PCR negative  11/28 blood positive yeast 12/1  blood pending 12/11 blood 1/2 positive quite negative staph possible contaminant 12/11 tracheal aspirate pending 12/11 urine positive VRE     Antimicrobials: Anti-infectives (From admission, onward)   Start     Stop   08/06/17 2200  ceftazidime-avibactam (AVYCAZ) 2.5 g in dextrose 5 % 50 mL IVPB  Status:  Discontinued     08/06/17 1547   08/06/17 1700  cefTAZidime (FORTAZ) 2 g in dextrose 5 % 50 mL IVPB     08/11/17 1717   08/06/17 0400  anidulafungin (ERAXIS) 100 mg in sodium chloride 0.9 % 100 mL IVPB     08/19/17 2359   08/05/17 0400  anidulafungin (ERAXIS) 200 mg in sodium chloride 0.9 % 200 mL IVPB     08/05/17 0838   08/04/17 1400  ceftazidime-avibactam (AVYCAZ) 1.25 g in dextrose 5 % 50 mL IVPB  Status:  Discontinued     08/06/17 1431   08/02/17 2100  vancomycin (VANCOCIN) IVPB 750 mg/150 ml premix  Status:  Discontinued     08/02/17 1630   08/02/17 2100  ceFEPIme (MAXIPIME) 1 g in dextrose 5 % 50 mL IVPB  Status:  Discontinued     08/02/17 1501   08/02/17 1800  linezolid (ZYVOX) IVPB 600 mg  Status:  Discontinued     08/04/17 1452   08/02/17 1600  ceftazidime-avibactam (AVYCAZ) 0.94 g in dextrose 5 % 50 mL IVPB  Status:  Discontinued     08/04/17 0838   08/01/17 1830  ceFEPIme (MAXIPIME) 2 g in dextrose 5 % 50 mL IVPB     08/01/17 2045   08/01/17 1745  vancomycin (VANCOCIN) 1,500 mg in sodium chloride 0.9 % 500 mL IVPB     08/01/17 2242   08/01/17 1730  piperacillin-tazobactam (ZOSYN) IVPB 3.375 g  Status:  Discontinued     08/01/17 1818   08/01/17 1730  vancomycin (VANCOCIN) IVPB 1000 mg/200 mL premix  Status:  Discontinued     08/01/17 1739       Devices   LINES / TUBES:  RIGHT PICC from [facility] -  Gastrostomy tube   Foley>> changed 11/26 >>  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 ampicillin (OMNIPEN) IV Stopped (08/18/17 0554)  . anidulafungin Stopped (08/18/17 0524)  . feeding supplement (VITAL AF 1.2 CAL) 1,000 mL (08/18/17 0539)      Objective: Vitals:   08/18/17 0600 08/18/17 0738 08/18/17 0824 08/18/17 0828  BP: (!) 109/48 104/64    Pulse: 72 91  (!) 102  Resp: 14 14  (!) 25  Temp:  99.5 F (37.5 C)    TempSrc:  Oral    SpO2: 100% 100% 100% 98%  Weight:      Height:        Intake/Output Summary (Last 24 hours) at 08/18/2017 0851 Last data filed at 08/18/2017 0739 Gross per 24 hour  Intake 2570 ml  Output 2085 ml  Net 485 ml   Filed Weights   08/17/17 0500 08/18/17 0017 08/18/17 0418  Weight: 168 lb 3.4 oz (76.3 kg) 169 lb 5 oz (76.8 kg) 171 lb 1.2 oz (77.6 kg)    Physical Exam:  General: Eyes open alert, nods yes and no appropriate questions, positive acute respiratory distress. (Currently on trach collar)  Neck:  Negative scars, masses, torticollis, lymphadenopathy, JVD, #7 cuffed trach in place covered and clean, negative sign of infection, negative secretions from trach. Patient able to speak with PMV  in place Lungs: Clear to auscultation bilaterally without wheezes or crackles Cardiovascular: Regular rate and rhythm without murmur gallop or rub normal S1 and S2 Abdomen: negative abdominal pain, positive distention/firm, hypoactive to absent bowel sounds, no rebound, no ascites, no appreciable mass, PEG tube in place, covered and clean negative sign of infection Skin: Stage II RIGHT buttocks decubitus ulcer, penile laceration. Perianal stage I ulcer Psychiatric:  Negative depression, negative anxiety, negative fatigue, negative mania  Central nervous system:  Quadriplegia, sensation intact grimaces to painful stimuli. negative receptive aphasia. Mouths answers to simple questions      .     Data Reviewed: Care during the described time interval was provided by me .  I have reviewed this patient's available data, including medical history, events of note, physical examination, and all test results as part of my evaluation.   CBC: Recent Labs  Lab 08/15/17 0324 08/16/17 0117  08/16/17 0327 08/17/17 0501 08/18/17 0804  WBC 13.3* 13.1* 12.4* 12.2* 13.2*  NEUTROABS  --  9.3*  --   --  10.9*  HGB 9.2* 9.8* 8.5* 9.0* 8.3*  HCT 30.7* 32.4* 28.7* 30.3* 28.5*  MCV 81.6 80.6 80.6 79.7 79.8  PLT 245 289 255 236 322   Basic Metabolic Panel: Recent Labs  Lab 08/13/17 0247 08/14/17 0435 08/15/17 0324 08/16/17 0117 08/17/17 0501  NA 139 137 138 136 137  K 4.5 5.0 4.4 4.4 3.8  CL 99* 96* 94* 94* 95*  CO2 35* 32 36* 32 32  GLUCOSE 114* 110* 117* 110* 121*  BUN 27* 27* 28* 30* 28*  CREATININE 0.80 0.70 0.77 0.86 0.82  CALCIUM 8.5* 8.8* 8.9 9.1 8.8*  MG  --  1.7  --   --   --    GFR: Estimated Creatinine Clearance: 96.9 mL/min (by C-G formula based on SCr of 0.82 mg/dL). Liver Function Tests: Recent Labs  Lab 08/13/17 0247 08/16/17 0117 08/17/17 0501  AST _0 ALT 18 14* 14*  ALKPHOS 170* 147* 138*  BILITOT 0.3 0.4 0.7  PROT 7.7 8.5* 8.3*  ALBUMIN 1.9* 2.4* 2.3*   No results for input(s): LIPASE, AMYLASE in the last 168 hours. No results for input(s): AMMONIA in the last 168 hours. Coagulation Profile: Recent Labs  Lab 08/14/17 0435 08/15/17 0324 08/16/17 0327 08/17/17 0501 08/18/17 0559  INR 1.11 1.18 1.25 1.31 1.31   Cardiac Enzymes: No results for input(s): CKTOTAL, CKMB, CKMBINDEX, TROPONINI in the last 168 hours. BNP (last 3 results) No results for input(s): PROBNP in the last 8760 hours. HbA1C: No results for input(s): HGBA1C in the last 72 hours. CBG: Recent Labs  Lab 08/17/17 1612 08/17/17 2046 08/18/17 0016 08/18/17 0416 08/18/17 0737  GLUCAP 94 107* 93 93 115*   Lipid Profile: No results for input(s): CHOL, HDL, LDLCALC, TRIG, CHOLHDL, LDLDIRECT in the last 72 hours. Thyroid Function Tests: No results for input(s): TSH, T4TOTAL, FREET4, T3FREE, THYROIDAB in the last 72 hours. Anemia Panel: No results for input(s): VITAMINB12, FOLATE, FERRITIN, TIBC, IRON, RETICCTPCT in the last 72 hours. Urine analysis:     Component Value Date/Time   COLORURINE YELLOW 08/16/2017 0331   APPEARANCEUR TURBID (A) 08/16/2017 0331   LABSPEC 1.012 08/16/2017 0331   PHURINE 7.0 08/16/2017 0331   GLUCOSEU NEGATIVE 08/16/2017 0331   HGBUR MODERATE (A) 08/16/2017 0331   BILIRUBINUR NEGATIVE 08/16/2017 0331   KETONESUR NEGATIVE 08/16/2017 0331   PROTEINUR 100 (A) 08/16/2017 0331   NITRITE NEGATIVE 08/16/2017 0331   LEUKOCYTESUR LARGE (  A) 08/16/2017 0331   Sepsis Labs: _0 (procalcitonin:4,lacticidven:4)  ) Recent Results (from the past 240 hour(s))  Culture, blood (routine x 2)     Status: None (Preliminary result)   Collection Time: 08/16/17 12:58 AM  Result Value Ref Range Status   Specimen Description BLOOD RIGHT HAND  Final   Special Requests   Final    BOTTLES DRAWN AEROBIC AND ANAEROBIC Blood Culture adequate volume   Culture NO GROWTH 1 DAY  Final   Report Status PENDING  Incomplete  Culture, blood (routine x 2)     Status: None (Preliminary result)   Collection Time: 08/16/17  1:15 AM  Result Value Ref Range Status   Specimen Description BLOOD LEFT HAND  Final   Special Requests   Final    BOTTLES DRAWN AEROBIC ONLY Blood Culture adequate volume   Culture  Setup Time   Final    GRAM POSITIVE COCCI AEROBIC BOTTLE ONLY CRITICAL RESULT CALLED TO, READ BACK BY AND VERIFIED WITH: L. POWELL, RPHARMD AT 9562 ON 08/17/17 BY C. JESSUP, MLT.    Culture GRAM POSITIVE COCCI  Final   Report Status PENDING  Incomplete  Blood Culture ID Panel (Reflexed)     Status: Abnormal   Collection Time: 08/16/17  1:15 AM  Result Value Ref Range Status   Enterococcus species NOT DETECTED NOT DETECTED Final   Listeria monocytogenes NOT DETECTED NOT DETECTED Final   Staphylococcus species DETECTED (A) NOT DETECTED Final    Comment: Methicillin (oxacillin) resistant coagulase negative staphylococcus. Possible blood culture contaminant (unless isolated from more than one blood culture draw or clinical case suggests  pathogenicity). No antibiotic treatment is indicated for blood  culture contaminants. CRITICAL RESULT CALLED TO, READ BACK BY AND VERIFIED WITH: L. POWELL, RPHARMD AT 1920 ON 08/17/17 BY C. JESSUP, MLT.    Staphylococcus aureus NOT DETECTED NOT DETECTED Final   Methicillin resistance DETECTED (A) NOT DETECTED Final    Comment: CRITICAL RESULT CALLED TO, READ BACK BY AND VERIFIED WITH: L. POWELL, RPHARMD AT 1920 ON 08/17/17 BY C. JESSUP, MLT.    Streptococcus species NOT DETECTED NOT DETECTED Final   Streptococcus agalactiae NOT DETECTED NOT DETECTED Final   Streptococcus pneumoniae NOT DETECTED NOT DETECTED Final   Streptococcus pyogenes NOT DETECTED NOT DETECTED Final   Acinetobacter baumannii NOT DETECTED NOT DETECTED Final   Enterobacteriaceae species NOT DETECTED NOT DETECTED Final   Enterobacter cloacae complex NOT DETECTED NOT DETECTED Final   Escherichia coli NOT DETECTED NOT DETECTED Final   Klebsiella oxytoca NOT DETECTED NOT DETECTED Final   Klebsiella pneumoniae NOT DETECTED NOT DETECTED Final   Proteus species NOT DETECTED NOT DETECTED Final   Serratia marcescens NOT DETECTED NOT DETECTED Final   Haemophilus influenzae NOT DETECTED NOT DETECTED Final   Neisseria meningitidis NOT DETECTED NOT DETECTED Final   Pseudomonas aeruginosa NOT DETECTED NOT DETECTED Final   Candida albicans NOT DETECTED NOT DETECTED Final   Candida glabrata NOT DETECTED NOT DETECTED Final   Candida krusei NOT DETECTED NOT DETECTED Final   Candida parapsilosis NOT DETECTED NOT DETECTED Final   Candida tropicalis NOT DETECTED NOT DETECTED Final  Culture, Urine     Status: Abnormal   Collection Time: 08/16/17  3:50 PM  Result Value Ref Range Status   Specimen Description URINE, CATHETERIZED  Final   Special Requests NONE  Final   Culture (A)  Final    >=100,000 COLONIES/mL VANCOMYCIN RESISTANT ENTEROCOCCUS ISOLATED   Report Status 08/18/2017 FINAL  Final  Organism ID, Bacteria VANCOMYCIN  RESISTANT ENTEROCOCCUS ISOLATED (A)  Final      Susceptibility   Vancomycin resistant enterococcus isolated - MIC*    AMPICILLIN >=32 RESISTANT Resistant     LEVOFLOXACIN >=8 RESISTANT Resistant     NITROFURANTOIN 128 RESISTANT Resistant     VANCOMYCIN >=32 RESISTANT Resistant     LINEZOLID 2 SENSITIVE Sensitive     * >=100,000 COLONIES/mL VANCOMYCIN RESISTANT ENTEROCOCCUS ISOLATED  Culture, respiratory (NON-Expectorated)     Status: None (Preliminary result)   Collection Time: 08/16/17 11:30 PM  Result Value Ref Range Status   Specimen Description TRACHEAL ASPIRATE  Final   Special Requests Normal  Final   Gram Stain   Final    MODERATE WBC PRESENT,BOTH PMN AND MONONUCLEAR ABUNDANT GRAM NEGATIVE RODS MODERATE GRAM POSITIVE RODS RARE GRAM POSITIVE COCCI    Culture PENDING  Incomplete   Report Status PENDING  Incomplete         Radiology Studies: Dg Abd Portable 1v  Result Date: 08/17/2017 CLINICAL DATA:  Increasing abdominal distention. Absent bowel sounds. EXAM: PORTABLE ABDOMEN - 1 VIEW COMPARISON:  Multiple exams, including CT abdomen from 08/04/2017 FINDINGS: Percutaneous gastrostomy tube noted with contrast filled retention balloon. Gas is present within identifiable large bowel along the transverse colon and hepatic flexure, as well as the splenic flexure. Indistinct loops of bowel in the pelvis could be an small or large bowel. No definite dilated bowel identified. Left lower lobe airspace opacity in the retrocardiac region. Cardiomegaly noted. IMPRESSION: 1. No definite dilated bowel. Percutaneous gastrostomy tube noted. Gas is present in the colon. 2. Left lower lobe airspace opacity. 3. Cardiomegaly. Electronically Signed   By: Van Clines M.D.   On: 08/17/2017 16:07        Scheduled Meds: . atorvastatin  10 mg Per Tube QHS  . baclofen  5 mg Per Tube BID  . chlorhexidine gluconate (MEDLINE KIT)  15 mL Mouth Rinse BID  . diltiazem  30 mg Per Tube Q6H  .  enoxaparin (LOVENOX) injection  80 mg Subcutaneous Q12H  . free water  200 mL Per Tube Q8H  . glycopyrrolate  0.2 mg Intravenous BID  . ipratropium-albuterol  3 mL Nebulization TID  . lactulose  20 g Per Tube TID  . levothyroxine  125 mcg Per Tube QAC breakfast  . mouth rinse  15 mL Mouth Rinse QID  . metoprolol tartrate  12.5 mg Per Tube BID  . pantoprazole sodium  40 mg Per Tube Daily  . polyethylene glycol  17 g Per Tube Daily  . senna-docusate  1 tablet Per Tube QHS  . simethicone  40 mg Per Tube QID  . warfarin  15 mg Oral ONCE-1800  . Warfarin - Pharmacist Dosing Inpatient   Does not apply q1800   Continuous Infusions: . ampicillin (OMNIPEN) IV Stopped (08/18/17 0554)  . anidulafungin Stopped (08/18/17 0524)  . feeding supplement (VITAL AF 1.2 CAL) 1,000 mL (08/18/17 0539)     LOS: 17 days    Time spent: 40 minutes    WOODS, Geraldo Docker, MD [facility] Pager [phone]   If 7PM-7AM, please contact night-coverage [email or web address] Password [email or web address] 08/18/2017, 8:51 AM

## 2017-08-18 NOTE — Progress Notes (Signed)
PULMONARY / CRITICAL CARE MEDICINE   Name: Craig DiegoCharlie J Karan Jr. MRN: [id number] DOB: 12-01-1950    ADMISSION DATE:  08/01/2017 CONSULTATION DATE:  08/01/2017  REFERRING MD:  Dr. 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 Blood cultures sent and started on cefepime 11/26 and amikacin 11/27.  Additionally, daughter reports patient normally requires ventilator at night normally but has required continously for the last two days.    [facility]; [facility]  [facility] with proteus bacteremia, septic and hemorrhagic shock.  Additionally has history of serratia and pseudomonas from tracheal aspirate in July, both sensitive to Cefepime, as well as enterobacter and morganella.  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    In ER, initial labs noted for K 6.5, BUN 213, sCr 4.74 (   ), WBC 17, Hgb 8.3, BNP 316, lactic acid 1.2 -> 0.82,  EKG with afib rate 132, CXR with bilateral infiltrates versus edema.  Normotensive in ER.  Treated with sepsis protocol with 2L NS bolus, vancomycin and cefepime given after blood cultures, and lopressor once for HR control.  Patient admitted for further evaluation.     SUBJECTIVE:   Comfortable on trach collar 28%.  VITAL SIGNS: BP 104/64 (BP Location: Right Arm)   Pulse (!) 102   Temp 99.5 F (37.5 C) (Oral)   Resp (!) 25   Ht 5\' 11"  (1.803 m)   Wt 77.6 kg (171 lb 1.2 oz)   SpO2 98%   BMI 23.86 kg/m     VENTILATOR SETTINGS: Vent Mode: PRVC FiO2 (%):  [28 %-30 %] 28 % Set Rate:  [14 bmp] 14  bmp Vt Set:  [600 mL] 600 mL PEEP:  [5 cmH20] 5 cmH20 Plateau Pressure:  [18 cmH20-19 cmH20] 18 cmH20  INTAKE / OUTPUT:  Intake/Output Summary (Last 24 hours) at 08/18/2017 [id number] Last data filed at 08/18/2017 [id number] Gross per 24 hour  Intake 2570 ml  Output 2025 ml  Net 545 ml   PHYSICAL EXAMINATION: General: overweight male in NAD on trach collar HEENT: Normocephalic atraumatic.  He has a #8 Portex cuffed trach in place. No JVD. PERRL.  Pulmonary: Some upper airway mucous otherwise clear lung sounds.  Cardiac: IRIR, no MRG Abdomen: Soft, non-tender.  PEG tube unremarkable Extremities, contracted, dependent edema Neuro/psych: Awake, interactive, gross motor upper extremity movement.  LABS:  BMET Recent Labs  Lab 08/16/17 0117 08/17/17 0501 08/18/17 0804  NA 136 137 137  K 4.4 3.8 3.8  CL 94* 95* 95*  CO2 32 32 32  BUN 30* 28* 31*  CREATININE 0.86 0.82 0.82  GLUCOSE 110* 121* 123*   Electrolytes Recent Labs  Lab 08/14/17 0435  08/16/17 0117 08/17/17 0501 08/18/17 0804  CALCIUM 8.8*   < > 9.1 8.8* 8.6*  MG 1.7  --   --   --  1.9   < > = values in this interval not displayed.   CBC Recent Labs  Lab 08/16/17 0327 08/17/17 0501 08/18/17 0804  WBC 12.4* 12.2* 13.2*  HGB 8.5* 9.0* 8.3*  HCT 28.7*  30.3* 28.5*  PLT 255 236 209   Coag's Recent Labs  Lab 08/16/17 0327 08/17/17 0501 08/18/17 0559  INR 1.25 1.31 1.31   Sepsis Markers Recent Labs  Lab 08/16/17 0125 08/16/17 0327 08/18/17 0804  LATICACIDVEN 0.8 0.8 0.7   ABG Recent Labs  Lab 08/14/17 1451 08/15/17 1049  PHART 7.354 7.484*  PCO2ART 72.5* 49.9*  PO2ART 60.4* 65.3*   Liver Enzymes Recent Labs  Lab 08/13/17 0247 08/16/17 0117 08/17/17 0501  AST 16 19 17   ALT 18 14* 14*  ALKPHOS 170* 147* 138*  BILITOT 0.3 0.4 0.7  ALBUMIN 1.9* 2.4* 2.3*   Cardiac Enzymes No results for input(s): TROPONINI, PROBNP in the last 168 hours.  Glucose Recent Labs  Lab 08/17/17 1208  08/17/17 1612 08/17/17 2046 08/18/17 0016 08/18/17 0416 08/18/17 0737  GLUCAP 110* 94 107* 93 93 115*   Imaging Dg Abd Portable 1v  Result Date: 08/17/2017 CLINICAL DATA:  Increasing abdominal distention. Absent bowel sounds. EXAM: PORTABLE ABDOMEN - 1 VIEW COMPARISON:  Multiple exams, including CT abdomen from 08/04/2017 FINDINGS: Percutaneous gastrostomy tube noted with contrast filled retention balloon. Gas is present within identifiable large bowel along the transverse colon and hepatic flexure, as well as the splenic flexure. Indistinct loops of bowel in the pelvis could be an small or large bowel. No definite dilated bowel identified. Left lower lobe airspace opacity in the retrocardiac region. Cardiomegaly noted. IMPRESSION: 1. No definite dilated bowel. Percutaneous gastrostomy tube noted. Gas is present in the colon. 2. Left lower lobe airspace opacity. 3. Cardiomegaly. Electronically Signed   By: Gaylyn Rong M.D.   On: 08/17/2017 16:07   STUDIES:  11/26 CXR >> bilateral infiltrates vs edema 11/27 CXR >> stable bilateral opacities TTE >> obtained 11/27 >> Mild increased left ventricular hypertrophy.  Systolic function was mildly reduced at 40-45% with global hypokinesis and inferior basal akinesis.  The right ventricle was mildly dilated but systolic function normal.  The right atrium was mildly dilated there is moderate tricuspid valve regurg.  There are no echocardiograms on file to compare CT Chest / Abd 11/27 >> Percutaneous gastrostomy tube retracted into the abdominal wall.  Radiologist reports what appears to be mature tract to the gastric lumen given oral contrast injected through catheter did not extravasate.  There is no pneumoperitoneum or fluid collection.  Advanced bilateral hydronephrosis with upper hydroureter.  High density in the right renal collecting system consistent with hemorrhage.  Left greater than right lower lobe airspace disease.  CULTURES: 11/26 BCx 2  >> coag neg staph, candida glabrata  11/26 trach aspirate >> pseudomonas aeruginosa >> S- ceftaz, cipro, zosyn 11/26 UC >> 30k yeast 11/26: blood culture RID: + MRSA  ANTIBIOTICS: ? 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 -Sputum collected 11/26  showing Pseudomonas; has since completed 10 days of antibiotic therapy -Currently 9.8 L positive since admit -White blood cell count holding around 13, no significant fever spike -Portex changed on 12/10 due to air leak now has size 8 cuffed Portex -Tolerating 28% Fio2 ATC well during waking hours. Plan Daily attempts at trach collar. Has reached goal of nocturnal vent only.  Continue routine tracheostomy care Continue antifungals/antibiotics per ID/TRH Continue nutritional support Watch for evidence of nosocomial infections: Would repeat sputum and cycle pro calcitonin's if spikes fever  Seems reasonable to be transferred back to [facility] from pulmonary perspective.  Joneen RoachPaul Giovannie Scerbo, AGACNP-BC [facility] Pager [phone] or [phone]  08/18/2017 9:48 AM

## 2017-08-19 DIAGNOSIS — B9689 Other specified bacterial agents as the cause of diseases classified elsewhere: Secondary | ICD-10-CM

## 2017-08-19 DIAGNOSIS — Z1630 Resistance to unspecified antimicrobial drugs: Secondary | ICD-10-CM

## 2017-08-19 LAB — BASIC METABOLIC PANEL
Anion gap: 9 (ref 5–15)
BUN: 31 mg/dL — AB (ref 6–20)
CALCIUM: 8.4 mg/dL — AB (ref 8.9–10.3)
CHLORIDE: 99 mmol/L — AB (ref 101–111)
CO2: 31 mmol/L (ref 22–32)
CREATININE: 0.77 mg/dL (ref 0.61–1.24)
GFR calc Af Amer: >60 mL/min (ref >60)
GFR calc non Af Amer: >60 mL/min (ref >60)
GLUCOSE: 103 mg/dL — AB (ref 65–99)
Potassium: 4.1 mmol/L (ref 3.5–5.1)
Sodium: 139 mmol/L (ref 135–145)

## 2017-08-19 LAB — HEMOGLOBIN AND HEMATOCRIT, BLOOD
HCT: 27.6 % — ABNORMAL LOW (ref 39.0–52.0)
HCT: 28.6 % — ABNORMAL LOW (ref 39.0–52.0)
Hemoglobin: 8.2 g/dL — ABNORMAL LOW (ref 13.0–17.0)
Hemoglobin: 8.4 g/dL — ABNORMAL LOW (ref 13.0–17.0)

## 2017-08-19 LAB — GLUCOSE, CAPILLARY
Glucose-Capillary: 103 mg/dL — ABNORMAL HIGH (ref 65–99)
Glucose-Capillary: 101 mg/dL — ABNORMAL HIGH (ref 65–99)
Glucose-Capillary: 101 mg/dL — ABNORMAL HIGH (ref 65–99)
Glucose-Capillary: 103 mg/dL — ABNORMAL HIGH (ref 65–99)
Glucose-Capillary: 95 mg/dL (ref 65–99)
Glucose-Capillary: 100 mg/dL — ABNORMAL HIGH (ref 65–99)

## 2017-08-19 LAB — CBC
HEMATOCRIT: 23.8 % — AB (ref 39.0–52.0)
Hemoglobin: 7 g/dL — ABNORMAL LOW (ref 13.0–17.0)
MCH: 23.8 pg — AB (ref 26.0–34.0)
MCHC: 29.4 g/dL — ABNORMAL LOW (ref 30.0–36.0)
MCV: 81 fL (ref 78.0–100.0)
PLATELETS: 220 10*3/uL (ref 150–400)
RBC: 2.94 MIL/uL — ABNORMAL LOW (ref 4.22–5.81)
RDW: 17.9 % — AB (ref 11.5–15.5)
WBC: 9.3 10*3/uL (ref 4.0–10.5)

## 2017-08-19 LAB — OCCULT BLOOD X 1 CARD TO LAB, STOOL: Fecal Occult Bld: NEGATIVE

## 2017-08-19 LAB — MAGNESIUM: Magnesium: 2 mg/dL (ref 1.7–2.4)

## 2017-08-19 LAB — PROTIME-INR
INR: 1.3
PROTHROMBIN TIME: 16.1 s — AB (ref 11.4–15.2)

## 2017-08-19 LAB — PROCALCITONIN: Procalcitonin: 0.7 ng/mL

## 2017-08-19 MED ORDER — ENOXAPARIN SODIUM 80 MG/0.8ML ~~LOC~~ SOLN
75.0000 mg | Freq: Two times a day (BID) | SUBCUTANEOUS | Status: DC
  Administered 2017-08-19 – 2017-08-20 (×3): 75 mg via SUBCUTANEOUS
  Filled 2017-08-19 (×3): qty 0.8

## 2017-08-19 MED ORDER — GLYCOPYRROLATE 1 MG PO TABS
1.0000 mg | ORAL_TABLET | Freq: Two times a day (BID) | ORAL | Status: DC
  Administered 2017-08-19 – 2017-08-20 (×3): 1 mg
  Filled 2017-08-19 (×3): qty 1

## 2017-08-19 MED ORDER — WARFARIN SODIUM 5 MG PO TABS
15.0000 mg | ORAL_TABLET | Freq: Once | ORAL | Status: AC
  Administered 2017-08-19: 15 mg via ORAL
  Filled 2017-08-19: qty 3

## 2017-08-19 MED ORDER — SODIUM CHLORIDE 0.45 % IV BOLUS
500.0000 mL | Freq: Once | INTRAVENOUS | Status: AC
  Administered 2017-08-19: 500 mL via INTRAVENOUS

## 2017-08-19 NOTE — Progress Notes (Signed)
Per MD plan for DC tomorrow  CSW updated [facility] and they can take patient over weekend if stable  CSW will continue to follow  Burna SisJenna H. Chaneka Trefz, LCSW Clinical Social Worker [phone]

## 2017-08-19 NOTE — Progress Notes (Signed)
Held midnight dose of cardizem due to decased BP of 86/57. HR 80s-90s. MD notified. Will continue to monitor.

## 2017-08-19 NOTE — Progress Notes (Signed)
Patient seen for trach team follow up.  All needed equipment at the bedside.  No education needed at this time.  Will see as needed.  Patient with chronic trach from OSH.  Trach team will see as needed.

## 2017-08-19 NOTE — Progress Notes (Signed)
PROGRESS NOTE    Craig Oneal.  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 Blood cultures sent and started on cefepime 11/26 and amikacin 11/27.  Additionally, daughter reports patient normally requires ventilator at night normally but has required continously for the last two days.     Patient has been at [facility] since February 2018; prior to was at [facility].  Hospitalized at [facility] in 01/2017 with proteus bacteremia, septic and hemorrhagic shock.  Additionally has history of serratia and pseudomonas from tracheal aspirate in July, both sensitive to Cefepime, as well as enterobacter and morganella.  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      Subjective: 12/14 last 24 hours afebrile. A/O 4, with PMV in place able to answer all questions appropriately. Negative CP, negative SOB, negative abdominal pain. Concerned about decubitus ulcer on right buttocks.     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)   Candidemia (HCC)   Tracheostomy status (HCC)   Hypoxemia   Acute on chronic respiratory failure with hypoxemia (HCC)   Ventilator dependence (HCC)   Severe sepsis/ positive VRE UTI -On admission patient met guidelines for sepsis RR> 20, HR> 90, WBC> 12 -Patient spiking fevers -12/13 start Zyvox  Acute on chronic respiratory failure with hypoxia/Positive  Pseudomonas Aeruginosa HCAP -Multifactorial to include HCA,P pulmonary edema, improving will attempt trach collar for 24 hours -Currently on trach collar with FiO2 at 35%: SPO2 100%  -Completed antibiotic/antifungal therapy per ID for HCAP and fungemia. -Change Craig Oneal  To 1 mg BID  -PMV  speech present   Funguria -completed antifungal per ID -See acute on CKD stage III  Hydronephrosis -12/2 per repeat ultrasound on 12/1 hydronephrosis has resolved  Hx Carbapenem-Resistant Enterobacteriaceae (HUT)@@@@ -Per [facility]  Fungemia positive candida glabrata, -Completed course antifungals per ID -Needs optho consult for Endopthalmitis: Will need to obtain as an outpatient.    Chronic systolic CHF -Strict in and out since admission +9.3 L -Daily weight Filed Weights   08/18/17 0017 08/18/17 0418 08/19/17 0413  Weight: 169 lb 5 oz (76.8 kg) 171 lb 1.2 oz (77.6 kg) 167 lb 15.9 oz (76.2 kg)  -Cardizem 30 mg QID  -Transfuse if hemoglobin <8  Pulmonary HTN -See CHF  Chronic Atrial Fibrillation -Prior to admission was on warfarin initially held. . -Warfarin restarted on 12/4: Per pharmacy  Recent Labs  Lab 08/14/17 0435 08/15/17 0324 08/16/17 0327 08/17/17 0501 08/18/17 0559 08/19/17 0551  INR 1.11 1.18 1.25 1.31 1.31 1.30  -currently subtherapeutic: However possible GI bleed? Awaiting repeat H/H  Acute on CKD stage III  -Has chronic Foley -RIGHT renal collecting system hemorrhage: Resolved -Secondary bilateral hydronephrosis/bacteremia  -Per urology believed source of bacteremia (Candida Glabrata from GU tract specifically RIGHT kidney).  -12/2 per Dr. Irine Oneal Urology noted after further evaluation no requirement for RIGHT Nephrostomy tube placement.  -Restart tube feeds Recent Labs  Lab 08/14/17 0435 08/15/17 0324 08/16/17  0117 08/17/17 0501 08/18/17 0804 08/19/17 0551  CREATININE 0.70 0.77 0.86 0.82 0.82 0.77  -Resolved  Hypernatremia@@@@ -Resolved:  -Continue  free water 200 ml q 8 hr  Hypokalemia@@@@  -Potassium goal>4  Hypomagnesemia@@@ -Magnesium goal>2  Chronic foley -12/14 Foley to be changed today per Dr. Gloriann Oneal urology recommendations  Cholecystitis? -Per renal ultrasound patient with possible cholecystitis given patient's quadriplegia and inability to communicate will obtain RUQ abdominal ultrasound per radiology recommendation -Abdominal ultrasound inconclusive for cholecystitis. Patient clinically improving, continue to monitor liver enzymes and WBC.  Dysphagia as late effect of CVA -NPO -Gtube-->malpositioned > replaced by IR -Transaminitis - appears chronic based on May 2018 labs -12/2 Restart vital AF 1.2 CAL 70 ml/hr -12/6 placed consult to speech for cognitive language evaluation.   Acute blood loss anemia @@@@ -presumably from area noted on CT scan from right renal collecting system noting concern for hemorrhage -11/27 transfused 2 units PRBC -12/1 transfuse 1 unit PRBC  -Transfuse for hemoglobin<8 Recent Labs  Lab 08/15/17 0324 08/16/17 0117 08/16/17 0327 08/17/17 0501 08/18/17 0804 08/19/17 0551  HGB 9.2* 9.8* 8.5* 9.0* 8.3* 7.0*  -12/14 repeat hemoglobin is actually low transfuse 1 unit UQJF@@@@@@ -Occult blood pending   Hypothyroidism  -Synthroid 125 g daily   Acute encephalopathy - Infection/sepsis ? -Hx cerebral atrophy, paraplegia w/ataxia -Improved, patient not having yes and no to questions appropriately.  Right AKA -Review of EMR does not show exact date of surgery or who performed surgery.  -Patient states had surgery 12 moments ago. -Upon exam today staples have been removed.   Abdominal distention -Patient with his increasing abdominal distention with hypoactive to absent bowel sounds, obtain KUB  RIGHT Buttocks stage II Decubitus ulcer/Penile Laceration -12/13 WOC consult placed     DVT prophylaxis: Warfarin per pharmacy Code Status: Full Family Communication: None Disposition  Plan: TBD   Consultants:  [facility] Urology ID     Procedures/Significant Events:   STUDIES:  11/26 CXR >> bilateral infiltrates vs edema 11/27 CXR >> stable bilateral opacities 11/27 Echocardiogram :  LVEF = 40-45% with global hypokinesis and inferior basal akinesis.   -RIGHT Atrium: moderate tricuspid valve regurg.  -Tricuspid valve: Moderate regurgitation -Pulmonary arteries PA peak pressure 51 mmHg   11/27 CT chest and abdomen : Percutaneous gastrostomy tube retracted into the abdominal wall.  Radiologist reports what appears to be mature tract to the gastric lumen given oral contrast injected through catheter did not extravasate.  There is no pneumoperitoneum or fluid collection.  Advanced bilateral hydronephrosis with upper hydroureter.  High density in the right renal collecting system consistent with hemorrhage.  Left greater than right lower lobe airspace disease. 11/29 CT abdomen pelvis W contrast:-Gastrostomy tube appears be in good position w/ocomplicating features. - Small bilateral pleural effusions and bibasilar atelectasis. -Cholelithiasis without definite CT findings for acute cholecystitis. - Right-sided hydroureteronephrosis without obvious cause.  12/1 Renal ultrasound:-Cholelithiasis. -Gallbladder wall is thickened to 5 mm. Dedicated ultrasound of the gallbladder and/or a HIDA scan may be helpful if there is concern for acute cholecystitis. -Previously identified right-sided hydronephrosis has almost completely resolved.  -Left renal cysts, incompletely characterized. 12/2 RUQ abdominal ultrasound:- A few small stones and some sludge are seen in the gallbladder with wall thickening. -No pericholecystic fluid.  -A Murphy's sign cannot be assessed as the patient is unresponsive.  -A HIDA scan could further evaluate for cystic duct patency/acute cholecystitis as clinically warranted. -The common bile duct measures 6 mm which is borderline with no significant dilatation.  I have personally reviewed and interpreted all radiology studies and my findings are as above.  VENTILATOR SETTINGS: Trach collar FiO2: 28%   Cultures Prior cultures at [facility] 11/23 ? 11/26 blood 2/2 positive coag negative staph  11/26 blood positive Candida Glabrata 11/26 Trach Aspirate: Positive Pseudomonas Aeruginosa  11/26 UC positive 30,000 colonies yeast 11/27 MRSA by PCR negative  11/28 blood positive yeast 12/1 blood pending 12/11 blood 1/2 positive quite negative staph possible contaminant 12/11 tracheal aspirate pending 12/11 urine positive VRE     Antimicrobials: Anti-infectives (From admission, onward)   Start     Stop   08/06/17 2200  ceftazidime-avibactam (AVYCAZ) 2.5 g in dextrose 5 % 50 mL IVPB  Status:  Discontinued     08/06/17 1547   08/06/17 1700  cefTAZidime (FORTAZ) 2 g in dextrose 5 % 50 mL IVPB     08/11/17 1717   08/06/17 0400  anidulafungin (ERAXIS) 100 mg in sodium chloride 0.9 % 100 mL IVPB     08/19/17 2359   08/05/17 0400  anidulafungin (ERAXIS) 200 mg in sodium chloride 0.9 % 200 mL IVPB     08/05/17 0838   08/04/17 1400  ceftazidime-avibactam (AVYCAZ) 1.25 g in dextrose 5 % 50 mL IVPB  Status:  Discontinued     08/06/17 1431   08/02/17 2100  vancomycin (VANCOCIN) IVPB 750 mg/150 ml premix  Status:  Discontinued     08/02/17 1630   08/02/17 2100  ceFEPIme (MAXIPIME) 1 g in dextrose 5 % 50 mL IVPB  Status:  Discontinued     08/02/17 1501   08/02/17 1800  linezolid (ZYVOX) IVPB 600 mg  Status:  Discontinued     08/04/17 1452   08/02/17 1600  ceftazidime-avibactam (AVYCAZ) 0.94 g in dextrose 5 % 50 mL IVPB  Status:  Discontinued     08/04/17 0838   08/01/17 1830  ceFEPIme (MAXIPIME) 2 g in dextrose 5 % 50 mL IVPB     08/01/17 2045   08/01/17 1745  vancomycin (VANCOCIN) 1,500 mg in sodium chloride 0.9 % 500 mL IVPB     08/01/17 2242   08/01/17 1730  piperacillin-tazobactam (ZOSYN) IVPB 3.375 g  Status:  Discontinued      08/01/17 1818   08/01/17 1730  vancomycin (VANCOCIN) IVPB 1000 mg/200 mL premix  Status:  Discontinued     08/01/17 1739       Devices   LINES / TUBES:  RIGHT PICC from [facility] -  Gastrostomy tube   Foley>> changed 11/26 >>  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 feeding supplement (VITAL AF 1.2 CAL) 1,000 mL (08/19/17 0602)  . linezolid (ZYVOX) IV Stopped (08/18/17 2331)     Objective: Vitals:   08/19/17 0500 08/19/17 0600 08/19/17 0724 08/19/17 0750  BP: 95/70 (!) 95/42 111/61   Pulse:  82 72   Resp: 15 16 19    Temp:   98.4 F (36.9 C)   TempSrc:   Oral   SpO2:  100% 100% 100%  Weight:      Height:        Intake/Output Summary (Last 24 hours) at 08/19/2017 0753 Last data filed at 08/19/2017 0602 Gross per 24 hour  Intake 1972.33 ml  Output 1800 ml  Net 172.33 ml   Filed Weights   08/18/17 0017 08/18/17 0418 08/19/17 0413  Weight: 169 lb 5 oz (76.8 kg) 171 lb 1.2 oz (77.6 kg) 167 lb 15.9 oz (76.2 kg)    Physical Exam:  General: A/O 4, positive acute respiratory distress Neck:  Negative scars, masses, torticollis, lymphadenopathy, JVD, #7 cuffed trach in place covered and clean, negative sign of infection, negative secretions from trach patient able to speak with PMV in place Lungs: Clear to auscultation bilaterally without wheezes or crackles Cardiovascular: Regular rate and rhythm without murmur gallop or rub normal S1 and S2 Abdomen: Obese, negative abdominal pain, nondistended, positive soft, bowel sounds, no rebound, no ascites, no appreciable mass, PEG tube in place covered and clean negative sign of infection Extremities: No significant cyanosis, clubbing, or edema bilateral lower extremities   Skin: Stage II RIGHT buttocks decubitus ulcer, penile laceration. Stage I perianal ulcer.  Psychiatric:  Negative depression, negative anxiety, negative fatigue, negative mania  Central nervous system:  Quadriplegia, sensation intact.   negative dysarthria, negative expressive aphasia, negative receptive aphasia    .     Data Reviewed: Care during the described time interval was provided by me .  I have reviewed this patient's available data, including medical history, events of note, physical examination, and all test results as part of my evaluation.   CBC: Recent Labs  Lab 08/16/17 0117 08/16/17 0327 08/17/17 0501 08/18/17 0804 08/19/17 0551  WBC 13.1* 12.4* 12.2* 13.2* 9.3  NEUTROABS 9.3*  --   --  10.9*  --   HGB 9.8* 8.5* 9.0* 8.3* 7.0*  HCT 32.4* 28.7* 30.3* 28.5* 23.8*  MCV 80.6 80.6 79.7 79.8 81.0  PLT 289 255 236 209 696   Basic Metabolic Panel: Recent Labs  Lab 08/14/17 0435 08/15/17 0324 08/16/17 0117 08/17/17 0501 08/18/17 0804 08/19/17 0551  NA 137 138 136 137 137 139  K 5.0 4.4 4.4 3.8 3.8 4.1  CL 96* 94* 94* 95* 95* 99*  CO2 32 36* 32 32 32 31  GLUCOSE 110* 117* 110* 121* 123* 103*  BUN 27* 28* 30* 28* 31* 31*  CREATININE 0.70 0.77 0.86 0.82 0.82 0.77  CALCIUM 8.8* 8.9 9.1 8.8* 8.6* 8.4*  MG 1.7  --   --   --  1.9 2.0   GFR: Estimated Creatinine Clearance: 99.4 mL/min (by C-G formula based on SCr of 0.77 mg/dL). Liver Function Tests: Recent Labs  Lab 08/13/17 0247 08/16/17 0117 08/17/17 0501  AST 16 19 17   ALT 18 14* 14*  ALKPHOS 170* 147* 138*  BILITOT 0.3 0.4 0.7  PROT 7.7 8.5* 8.3*  ALBUMIN 1.9* 2.4* 2.3*   No results for input(s): LIPASE, AMYLASE in the last 168 hours. No results for input(s): AMMONIA in the last 168 hours. Coagulation Profile: Recent Labs  Lab 08/15/17 0324 08/16/17 0327 08/17/17 0501 08/18/17 0559 08/19/17 0551  INR 1.18 1.25 1.31 1.31 1.30   Cardiac Enzymes: No results for input(s): CKTOTAL, CKMB, CKMBINDEX, TROPONINI in the last 168 hours. BNP (last 3 results) No results for input(s): PROBNP in the last 8760 hours. HbA1C: No results for input(s): HGBA1C in the last 72 hours. CBG: Recent Labs  Lab 08/18/17 1202 08/18/17 1629  08/18/17 2039 08/19/17 0033 08/19/17 0411  GLUCAP 98 84 104* 103* 101*   Lipid Profile: No results for input(s): CHOL, HDL, LDLCALC, TRIG, CHOLHDL, LDLDIRECT in the last 72 hours. Thyroid Function Tests: No results for input(s): TSH, T4TOTAL, FREET4, T3FREE, THYROIDAB in the last 72 hours. Anemia Panel: No results for input(s): VITAMINB12, FOLATE, FERRITIN, TIBC, IRON, RETICCTPCT in the last 72 hours. Urine analysis:    Component Value Date/Time   COLORURINE YELLOW 08/16/2017 0331   APPEARANCEUR TURBID (A) 08/16/2017 0331  LABSPEC 1.012 08/16/2017 0331   PHURINE 7.0 08/16/2017 0331   GLUCOSEU NEGATIVE 08/16/2017 0331   HGBUR MODERATE (A) 08/16/2017 0331   BILIRUBINUR NEGATIVE 08/16/2017 0331   KETONESUR NEGATIVE 08/16/2017 0331   PROTEINUR 100 (A) 08/16/2017 0331   NITRITE NEGATIVE 08/16/2017 0331   LEUKOCYTESUR LARGE (A) 08/16/2017 0331   Sepsis Labs: @LABRCNTIP (procalcitonin:4,lacticidven:4)  ) Recent Results (from the past 240 hour(s))  Culture, blood (routine x 2)     Status: None (Preliminary result)   Collection Time: 08/16/17 12:58 AM  Result Value Ref Range Status   Specimen Description BLOOD RIGHT HAND  Final   Special Requests   Final    BOTTLES DRAWN AEROBIC AND ANAEROBIC Blood Culture adequate volume   Culture NO GROWTH 2 DAYS  Final   Report Status PENDING  Incomplete  Culture, blood (routine x 2)     Status: Abnormal   Collection Time: 08/16/17  1:15 AM  Result Value Ref Range Status   Specimen Description BLOOD LEFT HAND  Final   Special Requests   Final    BOTTLES DRAWN AEROBIC ONLY Blood Culture adequate volume   Culture  Setup Time   Final    GRAM POSITIVE COCCI AEROBIC BOTTLE ONLY CRITICAL RESULT CALLED TO, READ BACK BY AND VERIFIED WITH: L. POWELL, RPHARMD AT 7096 ON 08/17/17 BY C. JESSUP, MLT.    Culture (A)  Final    STAPHYLOCOCCUS SPECIES (COAGULASE NEGATIVE) THE SIGNIFICANCE OF ISOLATING THIS ORGANISM FROM A SINGLE SET OF BLOOD CULTURES  WHEN MULTIPLE SETS ARE DRAWN IS UNCERTAIN. PLEASE NOTIFY THE MICROBIOLOGY DEPARTMENT WITHIN ONE WEEK IF SPECIATION AND SENSITIVITIES ARE REQUIRED.    Report Status 08/18/2017 FINAL  Final  Blood Culture ID Panel (Reflexed)     Status: Abnormal   Collection Time: 08/16/17  1:15 AM  Result Value Ref Range Status   Enterococcus species NOT DETECTED NOT DETECTED Final   Listeria monocytogenes NOT DETECTED NOT DETECTED Final   Staphylococcus species DETECTED (A) NOT DETECTED Final    Comment: Methicillin (oxacillin) resistant coagulase negative staphylococcus. Possible blood culture contaminant (unless isolated from more than one blood culture draw or clinical case suggests pathogenicity). No antibiotic treatment is indicated for blood  culture contaminants. CRITICAL RESULT CALLED TO, READ BACK BY AND VERIFIED WITH: L. POWELL, RPHARMD AT 1920 ON 08/17/17 BY C. JESSUP, MLT.    Staphylococcus aureus NOT DETECTED NOT DETECTED Final   Methicillin resistance DETECTED (A) NOT DETECTED Final    Comment: CRITICAL RESULT CALLED TO, READ BACK BY AND VERIFIED WITH: L. POWELL, RPHARMD AT 1920 ON 08/17/17 BY C. JESSUP, MLT.    Streptococcus species NOT DETECTED NOT DETECTED Final   Streptococcus agalactiae NOT DETECTED NOT DETECTED Final   Streptococcus pneumoniae NOT DETECTED NOT DETECTED Final   Streptococcus pyogenes NOT DETECTED NOT DETECTED Final   Acinetobacter baumannii NOT DETECTED NOT DETECTED Final   Enterobacteriaceae species NOT DETECTED NOT DETECTED Final   Enterobacter cloacae complex NOT DETECTED NOT DETECTED Final   Escherichia coli NOT DETECTED NOT DETECTED Final   Klebsiella oxytoca NOT DETECTED NOT DETECTED Final   Klebsiella pneumoniae NOT DETECTED NOT DETECTED Final   Proteus species NOT DETECTED NOT DETECTED Final   Serratia marcescens NOT DETECTED NOT DETECTED Final   Haemophilus influenzae NOT DETECTED NOT DETECTED Final   Neisseria meningitidis NOT DETECTED NOT DETECTED Final     Pseudomonas aeruginosa NOT DETECTED NOT DETECTED Final   Candida albicans NOT DETECTED NOT DETECTED Final   Candida glabrata NOT DETECTED  NOT DETECTED Final   Candida krusei NOT DETECTED NOT DETECTED Final   Candida parapsilosis NOT DETECTED NOT DETECTED Final   Candida tropicalis NOT DETECTED NOT DETECTED Final  Culture, Urine     Status: Abnormal   Collection Time: 08/16/17  3:50 PM  Result Value Ref Range Status   Specimen Description URINE, CATHETERIZED  Final   Special Requests NONE  Final   Culture (A)  Final    >=100,000 COLONIES/mL VANCOMYCIN RESISTANT ENTEROCOCCUS ISOLATED   Report Status 08/18/2017 FINAL  Final   Organism ID, Bacteria VANCOMYCIN RESISTANT ENTEROCOCCUS ISOLATED (A)  Final      Susceptibility   Vancomycin resistant enterococcus isolated - MIC*    AMPICILLIN >=32 RESISTANT Resistant     LEVOFLOXACIN >=8 RESISTANT Resistant     NITROFURANTOIN 128 RESISTANT Resistant     VANCOMYCIN >=32 RESISTANT Resistant     LINEZOLID 2 SENSITIVE Sensitive     * >=100,000 COLONIES/mL VANCOMYCIN RESISTANT ENTEROCOCCUS ISOLATED  Culture, respiratory (NON-Expectorated)     Status: None (Preliminary result)   Collection Time: 08/16/17 11:30 PM  Result Value Ref Range Status   Specimen Description TRACHEAL ASPIRATE  Final   Special Requests Normal  Final   Gram Stain   Final    MODERATE WBC PRESENT,BOTH PMN AND MONONUCLEAR ABUNDANT GRAM NEGATIVE RODS MODERATE GRAM POSITIVE RODS RARE GRAM POSITIVE COCCI    Culture   Final    MODERATE PSEUDOMONAS AERUGINOSA SUSCEPTIBILITIES TO FOLLOW    Report Status PENDING  Incomplete         Radiology Studies: Dg Abd Portable 1v  Result Date: 08/17/2017 CLINICAL DATA:  Increasing abdominal distention. Absent bowel sounds. EXAM: PORTABLE ABDOMEN - 1 VIEW COMPARISON:  Multiple exams, including CT abdomen from 08/04/2017 FINDINGS: Percutaneous gastrostomy tube noted with contrast filled retention balloon. Gas is present within  identifiable large bowel along the transverse colon and hepatic flexure, as well as the splenic flexure. Indistinct loops of bowel in the pelvis could be an small or large bowel. No definite dilated bowel identified. Left lower lobe airspace opacity in the retrocardiac region. Cardiomegaly noted. IMPRESSION: 1. No definite dilated bowel. Percutaneous gastrostomy tube noted. Gas is present in the colon. 2. Left lower lobe airspace opacity. 3. Cardiomegaly. Electronically Signed   By: Van Clines M.D.   On: 08/17/2017 16:07        Scheduled Meds: . atorvastatin  10 mg Per Tube QHS  . baclofen  5 mg Per Tube BID  . chlorhexidine gluconate (MEDLINE KIT)  15 mL Mouth Rinse BID  . collagenase   Topical Daily  . diltiazem  30 mg Per Tube Q6H  . enoxaparin (LOVENOX) injection  80 mg Subcutaneous Q12H  . free water  200 mL Per Tube Q8H  . glycopyrrolate  0.2 mg Intravenous BID  . ipratropium-albuterol  3 mL Nebulization TID  . lactulose  20 g Per Tube TID  . levothyroxine  125 mcg Per Tube QAC breakfast  . mouth rinse  15 mL Mouth Rinse QID  . metoprolol tartrate  12.5 mg Per Tube BID  . pantoprazole sodium  40 mg Per Tube Daily  . polyethylene glycol  17 g Per Tube Daily  . senna-docusate  1 tablet Per Tube QHS  . simethicone  40 mg Per Tube QID  . warfarin  15 mg Oral ONCE-1800  . Warfarin - Pharmacist Dosing Inpatient   Does not apply q1800   Continuous Infusions: . feeding supplement (VITAL AF 1.2 CAL) 1,000  mL (08/19/17 0602)  . linezolid (ZYVOX) IV Stopped (08/18/17 2331)     LOS: 18 days    Time spent: 40 minutes    Manasvini Whatley, Geraldo Docker, MD [facility] Pager [phone]   If 7PM-7AM, please contact night-coverage [email or web address] Password [facility] 08/19/2017, 7:53 AM

## 2017-08-19 NOTE — Progress Notes (Signed)
ANTICOAGULATION CONSULT NOTE  Pharmacy Consult:  Coumadin + Lovenox Indication: atrial fibrillation  No Known Allergies  Patient Measurements: Height: 5\' 11"  (180.3 cm) Weight: 167 lb 15.9 oz (76.2 kg) IBW/kg (Calculated) : 75.3  Assessment: 66 YOM who had Afib with RVR 11/26, hx PAF not on AC PTA. Hx of renal hemorrhage but has resolved. Coumadin started 12/5, on therapeutic Lovenox bridge.   INR remains subtherapeutic at 1.30. Dose had been increased for 12/13 pm but unfortunately was not administered. Hgb down to 7, PLTc 220. No noted bleeding. Weight decreased to 76 kg.   Goal of Therapy:  INR 2-3 Monitor platelets by anticoagulation protocol: Yes   Plan:  -Lovenox 75 mg subQ every 12 hours  -Coumadin 15 mg PO x 1 this evening  -Monitor daily INR, CBC, s/s of bleed  Pollyann SamplesAndy Hitomi Slape, PharmD, BCPS Clinical phone for 08/18/2017 from 7a-3:30p: [phone] If after 3:30p, please call main pharmacy at: [phone] 08/18/2017 8:23 AM

## 2017-08-19 NOTE — Progress Notes (Signed)
  Speech Language Pathology Treatment: [name] Speaking valve;Cognitive-Linquistic  Patient Details Name: Craig DiegoCharlie J Homesley Jr. MRN: [id number] DOB: 1951/04/28 Today's Date: 08/19/2017 Time: [id number] SLP Time Calculation (min) (ACUTE ONLY): 21 min  Assessment / Plan / Recommendation Clinical Impression  Pt seen for ongoing assessment of PMV tolerance/safety since trach change to portex 8 and intervention for dysarthria. SLP very familiar to pt from prior intervention at outside facility. Pt with PMV placed prior to ST arrival on trach collar and 35 percent oxygen. Reinforced compenstary speaking strategies including slowed rate, over articulating, pausing, and improving breath support for improved vocal intensity. Pt required mild cues for proper implementation. Pt able to manage secretions with oral expectoration. Pt okay for PMV usage in absence of SLP during wake hours as tolerated per RT discretion. ST to continue to monitor during acute stay.     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 Pt says that he has been working with SLP at [facility] and wears the PMV during waking hours.      SLP Plan  Continue with current plan of care       Recommendations         Patient may use Passy-Muir Speech Valve: During all therapies with supervision;During all waking hours (remove during sleep) PMSV Supervision: Intermittent         Follow up Recommendations: LTACH;Skilled Nursing facility SLP Visit Diagnosis: Aphonia (R49.1) Plan: Continue with current plan of care       GO               Jeani Sowhelsea Aislee Landgren MA, CCC-SLP Acute Care Speech Language  Pathologist    Aerianna Losey E Ryanna Teschner 08/19/2017, 2:44 PM

## 2017-08-20 DIAGNOSIS — I5022 Chronic systolic (congestive) heart failure: Secondary | ICD-10-CM

## 2017-08-20 DIAGNOSIS — N183 Chronic kidney disease, stage 3 (moderate): Secondary | ICD-10-CM

## 2017-08-20 DIAGNOSIS — B379 Candidiasis, unspecified: Secondary | ICD-10-CM

## 2017-08-20 DIAGNOSIS — N1330 Unspecified hydronephrosis: Secondary | ICD-10-CM

## 2017-08-20 DIAGNOSIS — E870 Hyperosmolality and hypernatremia: Secondary | ICD-10-CM

## 2017-08-20 DIAGNOSIS — A491 Streptococcal infection, unspecified site: Secondary | ICD-10-CM

## 2017-08-20 DIAGNOSIS — R6520 Severe sepsis without septic shock: Secondary | ICD-10-CM

## 2017-08-20 DIAGNOSIS — A498 Other bacterial infections of unspecified site: Secondary | ICD-10-CM

## 2017-08-20 DIAGNOSIS — N179 Acute kidney failure, unspecified: Secondary | ICD-10-CM

## 2017-08-20 DIAGNOSIS — Z9289 Personal history of other medical treatment: Secondary | ICD-10-CM

## 2017-08-20 DIAGNOSIS — Z1613 Resistance to carbapenem: Secondary | ICD-10-CM

## 2017-08-20 DIAGNOSIS — A419 Sepsis, unspecified organism: Secondary | ICD-10-CM

## 2017-08-20 DIAGNOSIS — Z978 Presence of other specified devices: Secondary | ICD-10-CM

## 2017-08-20 DIAGNOSIS — B49 Unspecified mycosis: Secondary | ICD-10-CM

## 2017-08-20 DIAGNOSIS — Z1624 Resistance to multiple antibiotics: Secondary | ICD-10-CM

## 2017-08-20 DIAGNOSIS — Z89611 Acquired absence of right leg above knee: Secondary | ICD-10-CM

## 2017-08-20 DIAGNOSIS — Z1621 Resistance to vancomycin: Secondary | ICD-10-CM

## 2017-08-20 DIAGNOSIS — Z960 Presence of urogenital implants: Secondary | ICD-10-CM

## 2017-08-20 DIAGNOSIS — J9610 Chronic respiratory failure, unspecified whether with hypoxia or hypercapnia: Secondary | ICD-10-CM

## 2017-08-20 DIAGNOSIS — D62 Acute posthemorrhagic anemia: Secondary | ICD-10-CM

## 2017-08-20 DIAGNOSIS — I272 Pulmonary hypertension, unspecified: Secondary | ICD-10-CM

## 2017-08-20 DIAGNOSIS — E876 Hypokalemia: Secondary | ICD-10-CM

## 2017-08-20 DIAGNOSIS — S3121XA Laceration without foreign body of penis, initial encounter: Secondary | ICD-10-CM

## 2017-08-20 DIAGNOSIS — J151 Pneumonia due to Pseudomonas: Secondary | ICD-10-CM

## 2017-08-20 DIAGNOSIS — I482 Chronic atrial fibrillation, unspecified: Secondary | ICD-10-CM

## 2017-08-20 LAB — BASIC METABOLIC PANEL
Anion gap: 10 (ref 5–15)
BUN: 28 mg/dL — AB (ref 6–20)
CHLORIDE: 99 mmol/L — AB (ref 101–111)
CO2: 30 mmol/L (ref 22–32)
CREATININE: 0.79 mg/dL (ref 0.61–1.24)
Calcium: 8.6 mg/dL — ABNORMAL LOW (ref 8.9–10.3)
GFR calc Af Amer: >60 mL/min (ref >60)
GFR calc non Af Amer: >60 mL/min (ref >60)
GLUCOSE: 98 mg/dL (ref 65–99)
POTASSIUM: 4 mmol/L (ref 3.5–5.1)
Sodium: 139 mmol/L (ref 135–145)

## 2017-08-20 LAB — GLUCOSE, CAPILLARY
GLUCOSE-CAPILLARY: 101 mg/dL — AB (ref 65–99)
GLUCOSE-CAPILLARY: 106 mg/dL — AB (ref 65–99)
GLUCOSE-CAPILLARY: 109 mg/dL — AB (ref 65–99)
GLUCOSE-CAPILLARY: 98 mg/dL (ref 65–99)
Glucose-Capillary: 103 mg/dL — ABNORMAL HIGH (ref 65–99)
Glucose-Capillary: 90 mg/dL (ref 65–99)

## 2017-08-20 LAB — CBC
HEMATOCRIT: 28.2 % — AB (ref 39.0–52.0)
HEMOGLOBIN: 8.2 g/dL — AB (ref 13.0–17.0)
MCH: 23.5 pg — ABNORMAL LOW (ref 26.0–34.0)
MCHC: 29.1 g/dL — AB (ref 30.0–36.0)
MCV: 80.8 fL (ref 78.0–100.0)
Platelets: 199 10*3/uL (ref 150–400)
RBC: 3.49 MIL/uL — ABNORMAL LOW (ref 4.22–5.81)
RDW: 17.6 % — AB (ref 11.5–15.5)
WBC: 7.2 10*3/uL (ref 4.0–10.5)

## 2017-08-20 LAB — PROTIME-INR
INR: 1.22
Prothrombin Time: 15.3 seconds — ABNORMAL HIGH (ref 11.4–15.2)

## 2017-08-20 LAB — PROCALCITONIN: Procalcitonin: 0.44 ng/mL

## 2017-08-20 LAB — MAGNESIUM: Magnesium: 2.1 mg/dL (ref 1.7–2.4)

## 2017-08-20 MED ORDER — LEVOTHYROXINE SODIUM 125 MCG PO TABS: 125.0000 ug | ORAL_TABLET | Freq: Every day | ORAL | 0 refills | Status: DC

## 2017-08-20 MED ORDER — DILTIAZEM 12 MG/ML ORAL SUSPENSION: 30.0000 mg | Freq: Four times a day (QID) | ORAL | 0 refills | Status: DC

## 2017-08-20 MED ORDER — IPRATROPIUM-ALBUTEROL 0.5-2.5 (3) MG/3ML IN SOLN: 3.0000 mL | Freq: Three times a day (TID) | RESPIRATORY_TRACT | 0 refills | Status: DC

## 2017-08-20 MED ORDER — SIMETHICONE 40 MG/0.6ML PO SUSP (UNIT DOSE): 40.0000 mg | Freq: Four times a day (QID) | ORAL | 0 refills | Status: AC | PRN

## 2017-08-20 MED ORDER — COLLAGENASE 250 UNIT/GM EX OINT: TOPICAL_OINTMENT | Freq: Every day | CUTANEOUS | 0 refills | Status: AC

## 2017-08-20 MED ORDER — WARFARIN SODIUM 5 MG PO TABS
10.0000 mg | ORAL_TABLET | Freq: Once | ORAL | Status: AC
  Administered 2017-08-20: 10 mg via ORAL
  Filled 2017-08-20: qty 2

## 2017-08-20 MED ORDER — LINEZOLID 600 MG/300ML IV SOLN: 600.0000 mg | Freq: Two times a day (BID) | INTRAVENOUS | 0 refills | Status: DC

## 2017-08-20 MED ORDER — PANTOPRAZOLE SODIUM 40 MG PO PACK: 40.0000 mg | PACK | Freq: Every day | ORAL | 0 refills | Status: DC

## 2017-08-20 MED ORDER — BACLOFEN 5 MG PO TABS: 5.0000 mg | ORAL_TABLET | Freq: Two times a day (BID) | ORAL | 0 refills | Status: AC

## 2017-08-20 MED ORDER — ENOXAPARIN SODIUM 80 MG/0.8ML ~~LOC~~ SOLN: 75.0000 mg | Freq: Two times a day (BID) | SUBCUTANEOUS | 0 refills | Status: DC

## 2017-08-20 MED ORDER — GLYCOPYRROLATE 1 MG PO TABS: 1.0000 mg | ORAL_TABLET | Freq: Two times a day (BID) | ORAL | 0 refills | Status: AC

## 2017-08-20 MED ORDER — PRO-STAT SUGAR FREE PO LIQD: 30.0000 mL | Freq: Two times a day (BID) | ORAL | 0 refills | Status: AC

## 2017-08-20 MED ORDER — WARFARIN SODIUM 10 MG PO TABS: 10.0000 mg | ORAL_TABLET | Freq: Every day | ORAL | 0 refills | Status: DC

## 2017-08-20 MED ORDER — CHLORHEXIDINE GLUCONATE 0.12% ORAL RINSE (MEDLINE KIT): 15.0000 mL | Freq: Two times a day (BID) | OROMUCOSAL | 0 refills | Status: DC

## 2017-08-20 MED ORDER — FREE WATER: 200.0000 mL | Freq: Three times a day (TID) | 0 refills | Status: DC

## 2017-08-20 MED ORDER — VITAL AF 1.2 CAL PO LIQD: 1000.0000 mL | ORAL | 0 refills | Status: DC

## 2017-08-20 MED ORDER — WARFARIN SODIUM 5 MG PO TABS: 10.0000 mg | ORAL_TABLET | Freq: Every day | ORAL | Status: DC

## 2017-08-20 MED ORDER — ATORVASTATIN CALCIUM 10 MG PO TABS: 10.0000 mg | ORAL_TABLET | Freq: Every day | ORAL | 0 refills | Status: AC

## 2017-08-20 MED ORDER — SIMETHICONE 40 MG/0.6ML PO SUSP (UNIT DOSE)
40.0000 mg | Freq: Four times a day (QID) | ORAL | Status: DC | PRN
  Filled 2017-08-20: qty 0.6

## 2017-08-20 MED ORDER — FENTANYL CITRATE (PF) 100 MCG/2ML IJ SOLN: 25.0000 ug | INTRAMUSCULAR | 0 refills | Status: DC | PRN

## 2017-08-20 NOTE — Progress Notes (Signed)
RN noticed pt has blood in his urine. On-call provider M. Lynch NP notified. Pt already has AM labs ordered. No new orders at this time. Will continue to monitor.

## 2017-08-20 NOTE — Progress Notes (Signed)
Report given to Craig Oneal, receiving RN at [facility]. Pt to be transported back to [facility] at 1630 by [employer]

## 2017-08-20 NOTE — Progress Notes (Signed)
ANTICOAGULATION CONSULT NOTE  Pharmacy Consult:  Coumadin + Lovenox Indication: atrial fibrillation  No Known Allergies  Patient Measurements: Height: 5\' 11"  (180.3 cm) Weight: 171 lb 4.8 oz (77.7 kg) IBW/kg (Calculated) : 75.3  Assessment: 66 YOM who had Afib with RVR 11/26, hx PAF not on AC PTA - MD notes that prior to admission was on warfarin but held pta. .  . Hx of renal hemorrhage but has resolved. Coumadin started 12/5, on therapeutic Lovenox bridge.   INR remains subtherapeutic at 1.22. Dose had been increased for 12/13 pm but was not administered per review of charting. Hgb down from 8.4 to  8.2 today.  -11/27 transfused 2 units PRBC -12/1 transfuse 1 unit PRBC  PLTC remains within normal. No noted bleeding. Weight stable at 77 kg.   Acute on CKD stage III: SCr stable/within normal. Estimated CrCl 3790ml/min Chronic foley. UOP 0.562ml/kg/hr  Goal of Therapy:  INR 2-3 Monitor platelets by anticoagulation protocol: Yes   Plan:  -Continue Lovenox 75 mg subQ every 12 hours  -Coumadin 10 mg PO x 1 this evening  -Monitor daily INR, CBC, s/s of bleed  Craig Oneal, RPh Clinical Pharmacist [id number]-[id number] [phone] After 4PM call Main Pharmacy [phone] 08/18/2017 8:23 AM

## 2017-08-20 NOTE — Progress Notes (Signed)
Received call from tele, pt w/ 2.24 sec sinus pause at 7:56 and 2.25 sec sinus pause at 8:09

## 2017-08-20 NOTE — Discharge Summary (Signed)
Physician Discharge Summary  Craig Oneal. SKA:[id number] DOB: [birth year] DOA: 08/01/2017  PCP: System, Pcp Not In  Admit date: 08/01/2017 Discharge date: 08/20/2017  Time spent: 35 minutes  Recommendations for Outpatient Follow-up:   Severe sepsis/ positive VRE UTI -On admission patient met guidelines for sepsis RR> 20, HR> 90, WBC> 12  -12/13 start Zyvox: 12/15 discussed case with Dr. Michel Bickers ID recommended completing seven-day course of antibiotics. -12/14 Foley was changed per urology recommendation.   Acute on chronic respiratory failure with hypoxia/Positive Pseudomonas Aeruginosa HCAP -Multifactorial to include HCA,P pulmonary edema, improving will attempt trach collar for 24 hours -Trach collar with FiO2 at 35%: SPO2 100%  -Completed antibiotic/antifungal therapy per ID for HCAP and fungemia  -Robinul 1 mg  BID  -PMV  when RN or speech therapy present    Funguria -completed antifungal treatment per ID  -See acute on CKD stage III    Hydronephrosis -per repeat ultrasound on 12/1 hydronephrosis has resolved   Hx Carbapenem-Resistant Enterobacteriaceae (CRE) -Per [facility]   Fungemia positive candida glabrata, -Completed course antifungals per ID -[facility] will need to arrange  Ophthalmology consult as outpatient to evaluate for Endopthalmitis:    Chronic systolic CHF -Strict in and out since admission  +12.5 L (question accuracy) -Daily weight      Filed Weights    08/18/17 0017 08/18/17 0418 08/19/17 0413  Weight: 169 lb 5 oz (76.8 kg) 171 lb 1.2 oz (77.6 kg) 167 lb 15.9 oz (76.2 kg)  -Cardizem 30 mg QID  -Metoprolol 12.5 mg bid -Transfuse if hemoglobin <8   Pulmonary HTN -See CHF   Chronic Atrial Fibrillation -Prior to admission was on warfarin initially held. . -Warfarin restarted on 12/4:  Recent Labs  Lab 08/15/17 0324 08/16/17 0327 08/17/17 0501 08/18/17 0559 08/19/17 0551 08/20/17 0539  INR 1.18 1.25 1.31 1.31 1.30 1.22  -currently  subtherapeutic:  -Discharge warfarin 10 mg daily -[facility] obtain INR daily: goal INR 2-3   Acute on CKD stage III  -Has chronic Foley -RIGHT renal collecting system hemorrhage: Resolved -Secondary bilateral hydronephrosis/bacteremia  -Per urology believed source of bacteremia (Candida Glabrata from GU tract specifically RIGHT kidney).  -12/2 per Dr. Irine Seal Urology noted after further evaluation no requirement for RIGHT Nephrostomy tube placement. Recent Labs  Lab 08/15/17 0324 08/16/17 0117 08/17/17 0501 08/18/17 0804 08/19/17 0551 08/20/17 0539  CREATININE 0.77 0.86 0.82 0.82 0.77 0.79  -Resolved   Hypernatremia -Resolved -Free water 261m q 8 hr   Hypokalemia   -Potassium goal>4   Hypomagnesemia -Magnesium goal>2   Chronic foley -12/14 Foley changed per Dr. BGloriann Loanurology recommendations -After completion of antibiotic treatment for VRE UTI [facility] needs to schedule follow-up appointment with Dr. BGloriann Loanfor discussion of placement Suprapubic Catheter.   Dysphagia as late effect of CVA -NPO -Gtube-->malpositioned > replaced by IR -Transaminitis resolving -Vital AF 1.2 CAL 70 ml/hr   Acute blood loss anemia  -presumably from area noted on CT scan from right renal collecting system noting concern for hemorrhage -11/27 transfused 2 units PRBC -12/1 transfuse 1 unit PRBC  -12/14 Occult blood negative Recent Labs  Lab 08/17/17 0501 08/18/17 0804 08/19/17 0551 08/19/17 1320 08/19/17 2212 08/20/17 0539  HGB 9.0* 8.3* 7.0* 8.2* 8.4* 8.2*  -Stable   Hypothyroidism  -Synthroid 125 g daily   Acute encephalopathy - Resolved    Right AKA -Review of EMR does not show exact date of surgery or who performed surgery.  -Patient states had surgery 12 moments ago. -Upon  exam today staples have been removed. 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)   Candidemia (HCC)   Tracheostomy status (HCC)   Hypoxemia   Acute on chronic respiratory failure with hypoxemia (HCC)   Ventilator dependence ([place])   Discharge Condition: Stable  Diet recommendation: Vital AF 1.2 CAL 70 ml/hr  Filed Weights   08/18/17 0418 08/19/17 0413 08/20/17 0400  Weight: 171 lb 1.2 oz (77.6 kg) 167 lb 15.9 oz (76.2 kg) 171 lb 4.8 oz (77.7 kg)    History of present illness:  66 year old BM PMHx of 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 Blood cultures sent and started on cefepime 11/26 and amikacin 11/27.  Additionally, daughter reports patient normally requires ventilator at night normally but has required continously for the last two days.     Patient has been at [facility] since February 2018; prior to was at [facility]  Hospitalized at [facility] in 01/2017 with proteus bacteremia, septic and hemorrhagic shock.  Additionally has history of serratia and pseudomonas from tracheal aspirate in July, both sensitive to Cefepime, as well as enterobacter and morganella.  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    Patient has had a complicated hospital course. Treated for multiple infections to include Pseudomonas HCAP, funguria, fungemia and currently being treated for VRE UTI. In addition patient hospital course was complicated by acute blood loss  anemia secondary to RIGHT renal collecting system hemorrhage which resolved after anticoagulation discontinued; acute on CKD stage III which has also resolved. Patient stable for discharge..   Procedures: STUDIES:  11/26 CXR >> bilateral infiltrates vs edema 11/27 CXR >> stable bilateral opacities 11/27 Echocardiogram :  LVEF = 40-45% with global hypokinesis and inferior basal akinesis.   -RIGHT [facility]: moderate tricuspid valve regurg.  -Tricuspid valve: Moderate regurgitation -Pulmonary arteries PA peak pressure 51 mmHg   11/27 CT chest and abdomen : Percutaneous gastrostomy tube retracted into the abdominal wall.  Radiologist reports what appears to be mature tract to the gastric lumen given oral contrast injected through catheter did not extravasate.  There is no pneumoperitoneum or fluid collection.  Advanced bilateral hydronephrosis with upper hydroureter.  High density in the right renal collecting system consistent with hemorrhage.  Left greater than right lower lobe airspace disease. 11/29 CT abdomen pelvis W contrast:-Gastrostomy tube appears be in good position w/ocomplicating features. - Small bilateral pleural effusions and bibasilar atelectasis. -Cholelithiasis without definite CT findings for acute cholecystitis. - Right-sided hydroureteronephrosis without obvious cause.  12/1 Renal ultrasound:-Cholelithiasis. -Gallbladder wall is thickened to 5 mm. Dedicated ultrasound of the gallbladder and/or a HIDA scan may be helpful if there is concern for acute cholecystitis. -Previously identified right-sided hydronephrosis has almost completely resolved.  -Left renal cysts, incompletely characterized. 12/2 RUQ abdominal ultrasound:- A few small stones and some sludge are seen  in the gallbladder with wall thickening. -No pericholecystic fluid.  -A Murphy's sign cannot be assessed as the patient is unresponsive.  -A HIDA scan could further evaluate for cystic duct patency/acute  cholecystitis as clinically warranted. -The common bile duct measures 6 mm which is borderline with no significant dilatation.   VENTILATOR SETTINGS  Trach collar FiO2: 35%  Consultations: [facility] ID    Cultures  Prior cultures at [facility] 11/23 ? 11/26 blood 2/2 positive coag negative staph  11/26 blood positive Candida Glabrata 11/26 Trach Aspirate: Positive Pseudomonas Aeruginosa  11/26 UC positive 30,000 colonies yeast 11/27 MRSA by PCR negative  11/28 blood positive yeast 12/1 blood pending 12/11 blood 1/2 positive quite negative staph possible contaminant 12/11 tracheal aspirate pending 12/11 urine positive VRE   Antibiotics Anti-infectives (From admission, onward)   Start     Dose/Rate Stop   08/18/17 1300  linezolid (ZYVOX) IVPB 600 mg     600 mg 300 mL/hr over 60 Minutes 08/26/17 2359   08/17/17 2200  ampicillin (OMNIPEN) 1 g in sodium chloride 0.9 % 50 mL IVPB  Status:  Discontinued     1 g 150 mL/hr over 20 Minutes 08/18/17 0854   08/06/17 2200  ceftazidime-avibactam (AVYCAZ) 2.5 g in dextrose 5 % 50 mL IVPB  Status:  Discontinued     2.5 g 25 mL/hr over 2 Hours 08/06/17 1547   08/06/17 1700  cefTAZidime (FORTAZ) 2 g in dextrose 5 % 50 mL IVPB     2 g 100 mL/hr over 30 Minutes 08/11/17 1717   08/06/17 0400  anidulafungin (ERAXIS) 100 mg in sodium chloride 0.9 % 100 mL IVPB     100 mg 78 mL/hr over 100 Minutes 08/19/17 0516   08/05/17 0400  anidulafungin (ERAXIS) 200 mg in sodium chloride 0.9 % 200 mL IVPB     200 mg 78 mL/hr over 200 Minutes 08/05/17 0838   08/04/17 1400  ceftazidime-avibactam (AVYCAZ) 1.25 g in dextrose 5 % 50 mL IVPB  Status:  Discontinued     1.25 g 25 mL/hr over 2 Hours 08/06/17 1431   08/02/17 2100  vancomycin (VANCOCIN) IVPB 750 mg/150 ml premix  Status:  Discontinued     750 mg 150 mL/hr over 60 Minutes 08/02/17 1630   08/02/17 2100  ceFEPIme (MAXIPIME) 1 g in dextrose 5 % 50 mL IVPB  Status:  Discontinued     1 g 100  mL/hr over 30 Minutes 08/02/17 1501   08/02/17 1800  linezolid (ZYVOX) IVPB 600 mg  Status:  Discontinued     600 mg 300 mL/hr over 60 Minutes 08/04/17 1452   08/02/17 1600  ceftazidime-avibactam (AVYCAZ) 0.94 g in dextrose 5 % 50 mL IVPB  Status:  Discontinued     0.94 g 25 mL/hr over 2 Hours 08/04/17 0838   08/01/17 1830  ceFEPIme (MAXIPIME) 2 g in dextrose 5 % 50 mL IVPB     2 g 100 mL/hr over 30 Minutes 08/01/17 2045   08/01/17 1745  vancomycin (VANCOCIN) 1,500 mg in sodium chloride 0.9 % 500 mL IVPB     1,500 mg 250 mL/hr over 120 Minutes 08/01/17 2242   08/01/17 1730  piperacillin-tazobactam (ZOSYN) IVPB 3.375 g  Status:  Discontinued     3.375 g 100 mL/hr over 30 Minutes 08/01/17 1818   08/01/17 1730  vancomycin (VANCOCIN) IVPB 1000 mg/200 mL premix  Status:  Discontinued     1,000 mg 200 mL/hr over 60 Minutes 08/01/17 1739  Discharge Exam: Vitals:   08/20/17 1000 08/20/17 1100 08/20/17 1129 08/20/17 1200  BP: 106/73 94/63  (!) 114/52  Pulse: (!) 105 (!) 118 91 (!) 124  Resp: (!) 30 (!) 22 (!) 27 20  Temp:  98.2 F (36.8 C)    TempSrc:  Oral    SpO2: 99% 98% 99% 100%  Weight:      Height:        General: A/O 4, positive acute respiratory distress Neck:  Negative scars, masses, torticollis, lymphadenopathy, JVD, #7 cuffed trach in place covered and clean, negative sign of infection, positive clear secretions from trach: patient able to speak with PMV in place Lungs: Clear to auscultation bilaterally without wheezes or crackles Cardiovascular: Regular rate and rhythm without murmur gallop or rub normal S1 and S2   Discharge Instructions   Allergies as of 08/20/2017   No Known Allergies     Medication List    STOP taking these medications   aluminum-magnesium hydroxide 200-200 MG/5ML suspension   amikacin IVPB Commonly known as:  AMIKIN   Cefepime HCl 2 GM/100ML Soln   clonazePAM 0.5 MG tablet Commonly known as:  KLONOPIN   docusate  sodium 100 MG capsule Commonly known as:  COLACE   ferrous sulfate 300 (60 Fe) MG/5ML syrup   furosemide 20 MG tablet Commonly known as:  LASIX   heparin 5000 UNIT/ML injection   lactulose 10 GM/15ML solution Commonly known as:  CHRONULAC   lansoprazole 30 MG capsule Commonly known as:  PREVACID   oxyCODONE 5 MG immediate release tablet Commonly known as:  Oxy IR/ROXICODONE   polyethylene glycol packet Commonly known as:  MIRALAX / GLYCOLAX   potassium chloride 20 MEQ packet Commonly known as:  KLOR-CON   sodium phosphate 7-19 GM/118ML Enem   STERILE WATER PO     TAKE these medications   acetaminophen 325 MG tablet Commonly known as:  TYLENOL Place 650 mg into feeding tube every 6 (six) hours as needed for mild pain (temp greater than than 100.6).   atorvastatin 10 MG tablet Commonly known as:  LIPITOR Place 1 tablet (10 mg total) into feeding tube at bedtime.   Baclofen 5 MG Tabs Place 5 mg into feeding tube 2 (two) times daily. What changed:  medication strength   chlorhexidine gluconate (MEDLINE KIT) 0.12 % solution Commonly known as:  PERIDEX 15 mLs by Mouth Rinse route 2 (two) times daily. What changed:    how to take this  when to take this   collagenase ointment Commonly known as:  SANTYL Apply topically daily. Apply Santyl to right buttock wound Q day, then cover with moist 2X2 and foam dressing.  (Change foam dressing Q 3 days or PRN soiling.) Start taking on:  08/21/2017   Darbepoetin Alfa 25 MCG/ML Soln Inject 25 mcg as directed every 28 (twenty-eight) days.   diltiazem 10 mg/ml oral suspension Commonly known as:  CARDIZEM Place 3 mLs (30 mg total) into feeding tube every 6 (six) hours.   enoxaparin 80 MG/0.8ML injection Commonly known as:  LOVENOX Inject 0.75 mLs (75 mg total) into the skin every 12 (twelve) hours.   feeding supplement (PRO-STAT SUGAR FREE 64) Liqd Place 30 mLs into feeding tube 2 (two) times daily. What changed:  how  to take this   feeding supplement (VITAL AF 1.2 CAL) Liqd Place 1,000 mLs into feeding tube continuous. What changed:    when to take this  Another medication with the same name was removed. Continue taking  this medication, and follow the directions you see here.   fentaNYL 100 MCG/2ML injection Commonly known as:  SUBLIMAZE Inject 0.5-1 mLs (25-50 mcg total) into the vein every 2 (two) hours as needed for severe pain.   fluticasone 50 MCG/ACT nasal spray Commonly known as:  FLONASE Place 2 sprays into both nostrils daily.   free water Soln Place 200 mLs into feeding tube every 8 (eight) hours.   glycopyrrolate 1 MG tablet Commonly known as:  ROBINUL Place 1 tablet (1 mg total) into feeding tube 2 (two) times daily.   ipratropium-albuterol 0.5-2.5 (3) MG/3ML Soln Commonly known as:  DUONEB Take 3 mLs by nebulization 3 (three) times daily. What changed:    when to take this  reasons to take this   levothyroxine 125 MCG tablet Commonly known as:  SYNTHROID, LEVOTHROID Place 1 tablet (125 mcg total) into feeding tube daily before breakfast. Start taking on:  08/21/2017   linezolid 600 MG/300ML IVPB Commonly known as:  ZYVOX Inject 300 mLs (600 mg total) into the vein every 12 (twelve) hours.   loratadine 10 MG tablet Commonly known as:  CLARITIN Place 10 mg into feeding tube daily.   metoprolol tartrate 25 MG tablet Commonly known as:  LOPRESSOR Place 0.5 tablets (12.5 mg total) into feeding tube 2 (two) times daily.   montelukast 10 MG tablet Commonly known as:  SINGULAIR Place 10 mg into feeding tube at bedtime.   multivitamin with minerals Tabs tablet Place 1 tablet into feeding tube daily.   pantoprazole sodium 40 mg/20 mL Pack Commonly known as:  PROTONIX Place 20 mLs (40 mg total) into feeding tube daily.   simethicone 40 mg/0.24m Susp Commonly known as:  MYLICON Place 0.6 mLs (40 mg total) into feeding tube every 6 (six) hours as needed for  flatulence.   Vitamin D (Ergocalciferol) 50000 units Caps capsule Commonly known as:  DRISDOL Place 50,000 Units into feeding tube every Friday.   warfarin 10 MG tablet Commonly known as:  COUMADIN Take 1 tablet (10 mg total) by mouth daily at 6 PM. Start taking on:  08/21/2017      No Known Allergies    The results of significant diagnostics from this hospitalization (including imaging, microbiology, ancillary and laboratory) are listed below for reference.    Significant Diagnostic Studies: Ct Abdomen Pelvis Wo Contrast  Result Date: 08/04/2017 CLINICAL DATA:  Multisystem organ failure. Recent gastrostomy tube placement. Hydronephrosis seen on recent CT scan from 2 days ago. EXAM: CT ABDOMEN AND PELVIS WITHOUT CONTRAST TECHNIQUE: Multidetector CT imaging of the abdomen and pelvis was performed following the standard protocol without IV contrast. COMPARISON:  CT scan 08/02/2017 FINDINGS: Lower chest: There are small bilateral pleural effusions and bibasilar atelectasis. The heart is enlarged. No definite pericardial effusion. The distal esophagus is grossly normal. Hepatobiliary: No focal hepatic lesions. Numerous small layering gallstones are noted in the gallbladder but no definite findings for acute cholecystitis. No common bile duct dilatation. Pancreas: No definite mass, inflammation or ductal dilatation. Spleen: Normal size.  No focal lesions. Adrenals/Urinary Tract: The adrenal glands are unremarkable. Right-sided hydroureteronephrosis all the way down to the bladder without definite obstructing calculus or mass. The bladder is decompressed by Foley catheter. There is an extra renal pelvis noted on left side but no definite hydronephrosis and no left-sided hydroureter. Stomach/Bowel: The feeding gastrostomy tube is in good position. No complicating features are identified. The small bowel and colon are grossly normal. No mass or obstruction. Vascular/Lymphatic: Advanced atherosclerotic  calcifications involving the aorta and iliac arteries but no aneurysm. Small scattered mesenteric and retroperitoneal lymph nodes but no mass or overt adenopathy. Reproductive: The prostate gland and seminal vesicles are unremarkable. Other: Small scattered pelvic lymph nodes without overt adenopathy. No inguinal mass or adenopathy. Musculoskeletal: No significant bony findings. IMPRESSION: 1. The feeding gastrostomy tube appears be in good position without complicating features. 2. Small bilateral pleural effusions and bibasilar atelectasis. 3. Cholelithiasis without definite CT findings for acute cholecystitis. 4. Right-sided hydroureteronephrosis without obvious cause. No distal obstructing ureteral calculus or obvious bladder lesion. The bladder is decompressed by a Foley catheter. 5. Small amount of free abdominal and free pelvic fluid. Electronically Signed   By: Marijo Sanes M.D.   On: 08/04/2017 16:14   Ct Abdomen Wo Contrast  Result Date: 08/02/2017 CLINICAL DATA:  Abnormal liver function tests. EXAM: CT CHEST AND ABDOMEN WITHOUT CONTRAST TECHNIQUE: Multidetector CT imaging of the chest and abdomen was performed following the standard protocol without IV contrast. COMPARISON:  Chest x-ray from earlier today FINDINGS: CT CHEST FINDINGS Cardiovascular: Cardiomegaly. No pericardial effusion. Atherosclerotic calcifications seen in the left more than right coronary circulation and along the aorta. Mediastinum/Nodes: Adenopathy in the bilateral mediastinum. Largest nodes are sub- carina and are difficult to distinguish from the esophagus. Where not obscured, the esophagus appears normal. A right subcarinal node measures up to 26 mm short axis and may have mineralization. Right upper extremity PICC with tip at the upper SVC. Lungs/Pleura: Airspace disease in the left more than right lower lobes consistent with pneumonia. Tracheostomy tube in place. No cavitation or effusion. Musculoskeletal: No acute  finding. CT ABDOMEN FINDINGS Hepatobiliary: No focal liver abnormality.Layering high-density in the gallbladder. No evidence of acute cholecystitis. No common bile duct dilatation. Pancreas: Unremarkable. Spleen: Unremarkable. Adrenals/Urinary Tract: Negative adrenals. Bilateral hydronephrosis and upper hydroureter to the level of the pelvic inlet. The bladder is not covered on this abdominal CT. There is high-density within the collecting system of the upper pole right kidney favoring hemorrhage over debris/mass. No noted stone. Stomach/Bowel: Percutaneous gastrostomy tube has been retracted into the abdominal wall. There is a visible gas filled track between the catheter tip and the stomach. Oral contrast was injected and did not extravasated into the peritoneum. Oral contrast is seen within nondilated small bowel. Formed stool throughout the visualized colon. Vascular/Lymphatic: Atherosclerotic calcification. No acute vascular finding. No mass or adenopathy. Other: Trace ascites. Musculoskeletal: No acute abnormalities. These results were called by telephone at the time of interpretation on 08/02/2017 at 10:03 pm to [facility]. A bladder scan was suggested depending on foley output. IMPRESSION: 1. A percutaneous gastrostomy tube has been retracted into the abdominal wall. There is a visible and presumably mature tract seen from the catheter to the gastric lumen. Oral contrast was injected through the catheter and did not extravasate. No pneumoperitoneum or collection. 2. Advanced bilateral hydronephrosis and upper hydroureter. Please correlate with foley output and possible bladder scan. 3. High-density in the upper right renal collecting system consistent with hemorrhage. Please correlate with UA. 4. Left more than right lower lobe pneumonia. 5. Nonspecific adenopathy with possible mineralization of subcarinal nodes; is there history of sarcoid? Consider chest CT follow-up in 3 months. 6. Cholelithiasis.  Electronically Signed   By: Monte Fantasia M.D.   On: 08/02/2017 22:11   Dg Chest 1 View  Result Date: 08/01/2017 CLINICAL DATA:  Fever and altered mental status EXAM: CHEST  1 VIEW COMPARISON:  01/25/2017 FINDINGS: Stable tracheostomy tube. Stable  PICC. Heart is upper normal in size. Hazy bilateral central and basilar airspace disease. No pneumothorax. No pleural effusion. IMPRESSION: Hazy bilateral airspace disease suggesting bilateral pneumonia or pulmonary edema. Electronically Signed   By: Marybelle Killings M.D.   On: 08/01/2017 18:14   Ct Chest Wo Contrast  Result Date: 08/02/2017 CLINICAL DATA:  Abnormal liver function tests. EXAM: CT CHEST AND ABDOMEN WITHOUT CONTRAST TECHNIQUE: Multidetector CT imaging of the chest and abdomen was performed following the standard protocol without IV contrast. COMPARISON:  Chest x-ray from earlier today FINDINGS: CT CHEST FINDINGS Cardiovascular: Cardiomegaly. No pericardial effusion. Atherosclerotic calcifications seen in the left more than right coronary circulation and along the aorta. Mediastinum/Nodes: Adenopathy in the bilateral mediastinum. Largest nodes are sub- carina and are difficult to distinguish from the esophagus. Where not obscured, the esophagus appears normal. A right subcarinal node measures up to 26 mm short axis and may have mineralization. Right upper extremity PICC with tip at the upper SVC. Lungs/Pleura: Airspace disease in the left more than right lower lobes consistent with pneumonia. Tracheostomy tube in place. No cavitation or effusion. Musculoskeletal: No acute finding. CT ABDOMEN FINDINGS Hepatobiliary: No focal liver abnormality.Layering high-density in the gallbladder. No evidence of acute cholecystitis. No common bile duct dilatation. Pancreas: Unremarkable. Spleen: Unremarkable. Adrenals/Urinary Tract: Negative adrenals. Bilateral hydronephrosis and upper hydroureter to the level of the pelvic inlet. The bladder is not covered on this  abdominal CT. There is high-density within the collecting system of the upper pole right kidney favoring hemorrhage over debris/mass. No noted stone. Stomach/Bowel: Percutaneous gastrostomy tube has been retracted into the abdominal wall. There is a visible gas filled track between the catheter tip and the stomach. Oral contrast was injected and did not extravasated into the peritoneum. Oral contrast is seen within nondilated small bowel. Formed stool throughout the visualized colon. Vascular/Lymphatic: Atherosclerotic calcification. No acute vascular finding. No mass or adenopathy. Other: Trace ascites. Musculoskeletal: No acute abnormalities. These results were called by telephone at the time of interpretation on 08/02/2017 at 10:03 pm to [facility]. A bladder scan was suggested depending on foley output. IMPRESSION: 1. A percutaneous gastrostomy tube has been retracted into the abdominal wall. There is a visible and presumably mature tract seen from the catheter to the gastric lumen. Oral contrast was injected through the catheter and did not extravasate. No pneumoperitoneum or collection. 2. Advanced bilateral hydronephrosis and upper hydroureter. Please correlate with foley output and possible bladder scan. 3. High-density in the upper right renal collecting system consistent with hemorrhage. Please correlate with UA. 4. Left more than right lower lobe pneumonia. 5. Nonspecific adenopathy with possible mineralization of subcarinal nodes; is there history of sarcoid? Consider chest CT follow-up in 3 months. 6. Cholelithiasis. Electronically Signed   By: Monte Fantasia M.D.   On: 08/02/2017 22:11   US Renal  Result Date: 08/06/2017 CLINICAL DATA:  Right-sided hydronephrosis seen on recent CT scan. EXAM: RENAL / URINARY TRACT ULTRASOUND COMPLETE COMPARISON:  CT scan August 04, 2017 FINDINGS: Right Kidney: Length: 15 cm. The previously identified right-sided hydronephrosis has almost completely resolved.  The right kidney is otherwise normal in appearance. Left Kidney: Length: 14.2 cm. Contains 2 masses, both cystic in nature. One of the masses is in the renal hilum, likely a parapelvic cyst. No hydronephrosis identified. Bladder: Decompressed with a Foley catheter. IMPRESSION: 1. Cholelithiasis. The gallbladder wall is thickened to 5 mm. Dedicated ultrasound of the gallbladder and/or a HIDA scan may be helpful if there is concern  for acute cholecystitis. 2. The previously identified right-sided hydronephrosis has almost completely resolved. Minimal prominence of the calices. 3. Left renal cysts, incompletely characterized. 4. Decompression of the bladder with a Foley catheter. Electronically Signed   By: Dorise Bullion III M.D   On: 08/06/2017 15:03   Ir Replc Gastro/colonic Francesco Runner Percut W/fluoro  Result Date: 08/03/2017 INDICATION: Malpositioned gastrostomy tube. Please perform fluoroscopic guided exchange and repositioning. EXAM: FLUOROSCOPIC GUIDED REPLACEMENT OF GASTROSTOMY TUBE COMPARISON:  CT the chest, abdomen and pelvis - 08/02/2017 MEDICATIONS: None. CONTRAST:  20 cc Isovue 300 - administered into the gastric lumen FLUOROSCOPY TIME:  1 minute 54 seconds (644 mGy) COMPLICATIONS: None immediate. PROCEDURE: The upper abdomen and external portion of the existing gastrostomy tube was prepped and draped in the usual sterile fashion, and a sterile drape was applied covering the operative field. Maximum barrier sterile technique with sterile gowns and gloves were used for the procedure. A timeout was performed prior to the initiation of the procedure. The existing gastrostomy tube was injected with a small amount of contrast demonstrating a patent track between the end of the gastrostomy disc and the gastric lumen. The existing disc retention gastrostomy tube was removed intact. With the use of a Kumpe the catheter, a regular glidewire was advanced through the prior subcutaneous gastrostomy track to the level  the gastric lumen. Contrast injection confirmed appropriate positioning. Next, over a short Amplatz wire, a new, slightly larger, 20 French balloon retention gastrostomy tube was inserted. The balloon was inflated and disc was cinched. Contrast was injected and several spot fluoroscopic images were obtained in various obliquities to confirm appropriate intraluminal positioning. A dressing was placed. The patient tolerated the procedure well without immediate postprocedural complication. IMPRESSION: Successful fluoroscopic guided replacement, repositioning and up sizing of now 20-French gastrostomy tube. The new gastrostomy tube is ready for immediate use. Electronically Signed   By: Sandi Mariscal M.D.   On: 08/03/2017 17:33   Dg Chest Port 1 View  Result Date: 08/15/2017 CLINICAL DATA:  66 year old male with a history of pneumonia EXAM: PORTABLE CHEST 1 VIEW COMPARISON:  Multiple prior chest x-ray, most recent 08/10/2017. Abdominal CT 08/04/2017 FINDINGS: Cardiomediastinal silhouette unchanged in size and contour. Mild interlobular septal thickening. Unchanged tracheostomy tube. Blunting of the bilateral costophrenic angles with pleuroparenchymal thickening at the lung base on the left. Similar patchy opacities in the bilateral lung bases. No pneumothorax. No displaced fracture IMPRESSION: Similar appearance of the chest x-ray, with mild airspace disease and evidence of bilateral small pleural effusions. Unchanged tracheostomy Electronically Signed   By: Corrie Mckusick D.O.   On: 08/15/2017 14:13   Dg Chest Port 1 View  Result Date: 08/10/2017 CLINICAL DATA:  Sepsis EXAM: PORTABLE CHEST 1 VIEW COMPARISON:  08/06/2017 FINDINGS: Cardiac shadow remains enlarged. Tracheostomy tube remains in place in satisfactory position. Left basilar infiltrate is again identified and stable. Small left pleural effusion is seen. The right lung remains clear. Postsurgical changes in the right ribcage are again noted and stable.  IMPRESSION: Stable left basilar changes.  No acute abnormality is noted. Electronically Signed   By: Inez Catalina M.D.   On: 08/10/2017 07:06   Dg Chest Port 1 View  Result Date: 08/06/2017 CLINICAL DATA:  Pneumonia EXAM: PORTABLE CHEST 1 VIEW COMPARISON:  08/05/2017 FINDINGS: Tracheostomy is unchanged. Right PICC line has been removed. Cardiomegaly with vascular congestion. Airspace disease throughout the left lung, most pronounced in the left lung base. Aeration of the left lung slightly worsened since prior study.  No confluent opacity on the right. Possible small left effusion. IMPRESSION: Left lung airspace disease, most confluent in the left lower lobe, slightly increased since prior study. Suspect small left effusion. Electronically Signed   By: Rolm Baptise M.D.   On: 08/06/2017 07:12   Dg Chest Port 1 View  Result Date: 08/05/2017 CLINICAL DATA:  Acute respiratory failure EXAM: PORTABLE CHEST 1 VIEW COMPARISON:  08/03/2017 FINDINGS: Tracheostomy and right PICC line remain in place, unchanged. Cardiomegaly. Vascular congestion. Continued bilateral perihilar and lower lobe opacities, left greater than right, slightly improved on the right since prior study. This likely reflects improving edema. IMPRESSION: Cardiomegaly. Suspect mild improvement in pulmonary edema pattern with mild residual edema/CHF. Electronically Signed   By: Rolm Baptise M.D.   On: 08/05/2017 09:22   Dg Chest Port 1 View  Result Date: 08/03/2017 CLINICAL DATA:  66 year old male with acute renal failure, severe hyper code anemia. Acute on chronic respiratory failure. EXAM: PORTABLE CHEST 1 VIEW COMPARISON:  08/02/2017 and earlier. FINDINGS: Portable AP semi upright view at 0423 hours. Stable tracheostomy. Stable right PICC line. Increased bilateral pulmonary interstitial opacity since yesterday, a mildly asymmetrically worse in the left lung. Stable lung volumes. Stable cardiac size and mediastinal contours. No pneumothorax.  Increased patchy and streaky bilateral infrahilar opacity. No large pleural effusion. IMPRESSION: 1. Interval increased bilateral pulmonary interstitial opacity and streaky medial lung base opacity. 2. Favor acute interstitial edema with atelectasis rather than acute viral/atypical respiratory infection. Electronically Signed   By: Genevie Ann M.D.   On: 08/03/2017 06:55   Dg Chest Port 1 View  Result Date: 08/02/2017 CLINICAL DATA:  Respiratory failure. EXAM: PORTABLE CHEST 1 VIEW COMPARISON:  Yesterday at 1741 hour FINDINGS: Tracheostomy tube at thoracic inlet. Right upper extremity PICC tip in the mid SVC. Cardiomegaly is unchanged. Possible developing left pleural effusion and retrocardiac consolidation. Similar-appearing heterogeneous bilateral airspace opacity. No pneumothorax. IMPRESSION: 1. Possible developing left pleural effusion a retrocardiac consolidation. 2. Heterogeneous bilateral airspace opacity appears similar, pulmonary edema or infectious. Electronically Signed   By: Jeb Levering M.D.   On: 08/02/2017 05:02   Dg Abd Portable 1v  Result Date: 08/17/2017 CLINICAL DATA:  Increasing abdominal distention. Absent bowel sounds. EXAM: PORTABLE ABDOMEN - 1 VIEW COMPARISON:  Multiple exams, including CT abdomen from 08/04/2017 FINDINGS: Percutaneous gastrostomy tube noted with contrast filled retention balloon. Gas is present within identifiable large bowel along the transverse colon and hepatic flexure, as well as the splenic flexure. Indistinct loops of bowel in the pelvis could be an small or large bowel. No definite dilated bowel identified. Left lower lobe airspace opacity in the retrocardiac region. Cardiomegaly noted. IMPRESSION: 1. No definite dilated bowel. Percutaneous gastrostomy tube noted. Gas is present in the colon. 2. Left lower lobe airspace opacity. 3. Cardiomegaly. Electronically Signed   By: Van Clines M.D.   On: 08/17/2017 16:07   US Abdomen Limited Ruq  Result  Date: 08/07/2017 CLINICAL DATA:  Pain. EXAM: ULTRASOUND ABDOMEN LIMITED RIGHT UPPER QUADRANT COMPARISON:  Renal ultrasound from yesterday FINDINGS: Gallbladder: Stones and sludge seen in the gallbladder. The gallbladder wall measures 4.2 mm. A Murphy's sign cannot be assessed as the patient is unresponsive. No pericholecystic fluid. Common bile duct: Diameter: 6 mm Liver: No focal lesion identified. Within normal limits in parenchymal echogenicity. Portal vein is patent on color Doppler imaging with normal direction of blood flow towards the liver. IMPRESSION: 1. A few small stones and some sludge are seen in the gallbladder with  wall thickening. No pericholecystic fluid. A Murphy's sign cannot be assessed as the patient is unresponsive. A HIDA scan could further evaluate for cystic duct patency/acute cholecystitis as clinically warranted. 2. The common bile duct measures 6 mm which is borderline with no significant dilatation. Recommend correlation with labs. Electronically Signed   By: Dorise Bullion III M.D   On: 08/07/2017 19:34    Microbiology: Recent Results (from the past 240 hour(s))  Culture, blood (routine x 2)     Status: None (Preliminary result)   Collection Time: 08/16/17 12:58 AM  Result Value Ref Range Status   Specimen Description BLOOD RIGHT HAND  Final   Special Requests   Final    BOTTLES DRAWN AEROBIC AND ANAEROBIC Blood Culture adequate volume   Culture NO GROWTH 3 DAYS  Final   Report Status PENDING  Incomplete  Culture, blood (routine x 2)     Status: Abnormal   Collection Time: 08/16/17  1:15 AM  Result Value Ref Range Status   Specimen Description BLOOD LEFT HAND  Final   Special Requests   Final    BOTTLES DRAWN AEROBIC ONLY Blood Culture adequate volume   Culture  Setup Time   Final    GRAM POSITIVE COCCI AEROBIC BOTTLE ONLY CRITICAL RESULT CALLED TO, READ BACK BY AND VERIFIED WITH: L. POWELL, RPHARMD AT 2633 ON 08/17/17 BY C. JESSUP, MLT.    Culture (A)  Final     STAPHYLOCOCCUS SPECIES (COAGULASE NEGATIVE) THE SIGNIFICANCE OF ISOLATING THIS ORGANISM FROM A SINGLE SET OF BLOOD CULTURES WHEN MULTIPLE SETS ARE DRAWN IS UNCERTAIN. PLEASE NOTIFY THE MICROBIOLOGY DEPARTMENT WITHIN ONE WEEK IF SPECIATION AND SENSITIVITIES ARE REQUIRED.    Report Status 08/18/2017 FINAL  Final  Blood Culture ID Panel (Reflexed)     Status: Abnormal   Collection Time: 08/16/17  1:15 AM  Result Value Ref Range Status   Enterococcus species NOT DETECTED NOT DETECTED Final   Listeria monocytogenes NOT DETECTED NOT DETECTED Final   Staphylococcus species DETECTED (A) NOT DETECTED Final    Comment: Methicillin (oxacillin) resistant coagulase negative staphylococcus. Possible blood culture contaminant (unless isolated from more than one blood culture draw or clinical case suggests pathogenicity). No antibiotic treatment is indicated for blood  culture contaminants. CRITICAL RESULT CALLED TO, READ BACK BY AND VERIFIED WITH: L. POWELL, RPHARMD AT 1920 ON 08/17/17 BY C. JESSUP, MLT.    Staphylococcus aureus NOT DETECTED NOT DETECTED Final   Methicillin resistance DETECTED (A) NOT DETECTED Final    Comment: CRITICAL RESULT CALLED TO, READ BACK BY AND VERIFIED WITH: L. POWELL, RPHARMD AT 1920 ON 08/17/17 BY C. JESSUP, MLT.    Streptococcus species NOT DETECTED NOT DETECTED Final   Streptococcus agalactiae NOT DETECTED NOT DETECTED Final   Streptococcus pneumoniae NOT DETECTED NOT DETECTED Final   Streptococcus pyogenes NOT DETECTED NOT DETECTED Final   Acinetobacter baumannii NOT DETECTED NOT DETECTED Final   Enterobacteriaceae species NOT DETECTED NOT DETECTED Final   Enterobacter cloacae complex NOT DETECTED NOT DETECTED Final   Escherichia coli NOT DETECTED NOT DETECTED Final   Klebsiella oxytoca NOT DETECTED NOT DETECTED Final   Klebsiella pneumoniae NOT DETECTED NOT DETECTED Final   Proteus species NOT DETECTED NOT DETECTED Final   Serratia marcescens NOT DETECTED NOT  DETECTED Final   Haemophilus influenzae NOT DETECTED NOT DETECTED Final   Neisseria meningitidis NOT DETECTED NOT DETECTED Final   Pseudomonas aeruginosa NOT DETECTED NOT DETECTED Final   Candida albicans NOT DETECTED NOT DETECTED Final  Candida glabrata NOT DETECTED NOT DETECTED Final   Candida krusei NOT DETECTED NOT DETECTED Final   Candida parapsilosis NOT DETECTED NOT DETECTED Final   Candida tropicalis NOT DETECTED NOT DETECTED Final  Culture, Urine     Status: Abnormal   Collection Time: 08/16/17  3:50 PM  Result Value Ref Range Status   Specimen Description URINE, CATHETERIZED  Final   Special Requests NONE  Final   Culture (A)  Final    >=100,000 COLONIES/mL VANCOMYCIN RESISTANT ENTEROCOCCUS ISOLATED   Report Status 08/18/2017 FINAL  Final   Organism ID, Bacteria VANCOMYCIN RESISTANT ENTEROCOCCUS ISOLATED (A)  Final      Susceptibility   Vancomycin resistant enterococcus isolated - MIC*    AMPICILLIN >=32 RESISTANT Resistant     LEVOFLOXACIN >=8 RESISTANT Resistant     NITROFURANTOIN 128 RESISTANT Resistant     VANCOMYCIN >=32 RESISTANT Resistant     LINEZOLID 2 SENSITIVE Sensitive     * >=100,000 COLONIES/mL VANCOMYCIN RESISTANT ENTEROCOCCUS ISOLATED  Culture, respiratory (NON-Expectorated)     Status: None (Preliminary result)   Collection Time: 08/16/17 11:30 PM  Result Value Ref Range Status   Specimen Description TRACHEAL ASPIRATE  Final   Special Requests Normal  Final   Gram Stain   Final    MODERATE WBC PRESENT,BOTH PMN AND MONONUCLEAR ABUNDANT GRAM NEGATIVE RODS MODERATE GRAM POSITIVE RODS RARE GRAM POSITIVE COCCI    Culture MODERATE PSEUDOMONAS AERUGINOSA  Final   Report Status PENDING  Incomplete   Organism ID, Bacteria PSEUDOMONAS AERUGINOSA  Final      Susceptibility   Pseudomonas aeruginosa - MIC*    CEFTAZIDIME 16 INTERMEDIATE Intermediate     CIPROFLOXACIN 1 SENSITIVE Sensitive     GENTAMICIN <=1 SENSITIVE Sensitive     IMIPENEM >=16 RESISTANT  Resistant     CEFEPIME 16 INTERMEDIATE Intermediate     * MODERATE PSEUDOMONAS AERUGINOSA     Labs: Basic Metabolic Panel: Recent Labs  Lab 08/14/17 0435  08/16/17 0117 08/17/17 0501 08/18/17 0804 08/19/17 0551 08/20/17 0539  NA 137   < > 136 137 137 139 139  K 5.0   < > 4.4 3.8 3.8 4.1 4.0  CL 96*   < > 94* 95* 95* 99* 99*  CO2 32   < > 32 32 32 31 30  GLUCOSE 110*   < > 110* 121* 123* 103* 98  BUN 27*   < > 30* 28* 31* 31* 28*  CREATININE 0.70   < > 0.86 0.82 0.82 0.77 0.79  CALCIUM 8.8*   < > 9.1 8.8* 8.6* 8.4* 8.6*  MG 1.7  --   --   --  1.9 2.0 2.1   < > = values in this interval not displayed.   Liver Function Tests: Recent Labs  Lab 08/16/17 0117 08/17/17 0501  AST 19 17  ALT 14* 14*  ALKPHOS 147* 138*  BILITOT 0.4 0.7  PROT 8.5* 8.3*  ALBUMIN 2.4* 2.3*   No results for input(s): LIPASE, AMYLASE in the last 168 hours. No results for input(s): AMMONIA in the last 168 hours. CBC: Recent Labs  Lab 08/16/17 0117 08/16/17 0327 08/17/17 0501 08/18/17 0804 08/19/17 0551 08/19/17 1320 08/19/17 2212 08/20/17 0539  WBC 13.1* 12.4* 12.2* 13.2* 9.3  --   --  7.2  NEUTROABS 9.3*  --   --  10.9*  --   --   --   --   HGB 9.8* 8.5* 9.0* 8.3* 7.0* 8.2* 8.4* 8.2*  HCT  32.4* 28.7* 30.3* 28.5* 23.8* 27.6* 28.6* 28.2*  MCV 80.6 80.6 79.7 79.8 81.0  --   --  80.8  PLT 289 255 236 209 220  --   --  199   Cardiac Enzymes: No results for input(s): CKTOTAL, CKMB, CKMBINDEX, TROPONINI in the last 168 hours. BNP: BNP (last 3 results) Recent Labs    08/01/17 1912  BNP 316.7*    ProBNP (last 3 results) No results for input(s): PROBNP in the last 8760 hours.  CBG: Recent Labs  Lab 08/20/17 0030 08/20/17 0429 08/20/17 0455 08/20/17 0806 08/20/17 1146  GLUCAP 109* 98 103* 106* 90       Signed:  Dia Crawford, MD [facility] [phone] pager

## 2017-08-20 NOTE — Progress Notes (Signed)
Clinical Social Worker facilitated patient discharge including contacting patient family and facility to confirm patient discharge plans.  Clinical information faxed to facility and family agreeable with plan.  CSW arranged ambulance transport via [facility] to [facility] .  RN Brayton CavesJessie to call [phone] and speak to [facility] RN for report prior to discharge.  Clinical Social Worker will sign off for now as social work intervention is no longer needed. Please consult us again if new need arises.  Craig Oneal, MSW, [employer] [phone]

## 2017-08-21 LAB — CULTURE, BLOOD (ROUTINE X 2)
CULTURE: NO GROWTH
SPECIAL REQUESTS: ADEQUATE

## 2017-08-21 LAB — CULTURE, RESPIRATORY: SPECIAL REQUESTS: NORMAL

## 2017-08-21 LAB — CULTURE, RESPIRATORY W GRAM STAIN

## 2017-09-18 ENCOUNTER — Emergency Department (HOSPITAL_COMMUNITY): Payer: Medicare Other

## 2017-09-18 ENCOUNTER — Inpatient Hospital Stay (HOSPITAL_COMMUNITY): Payer: Medicare Other

## 2017-09-18 ENCOUNTER — Other Ambulatory Visit: Payer: Self-pay

## 2017-09-18 ENCOUNTER — Inpatient Hospital Stay (HOSPITAL_COMMUNITY)
Admission: EM | Admit: 2017-09-18 | Discharge: 2017-09-25 | DRG: 698 | Disposition: A | Discharge status: Other Acute Inpatient Hospital | Payer: Medicare Other | Attending: Internal Medicine | Admitting: Internal Medicine

## 2017-09-18 ENCOUNTER — Encounter (HOSPITAL_COMMUNITY): Payer: Self-pay | Admitting: Internal Medicine

## 2017-09-18 DIAGNOSIS — Y95 Nosocomial condition: Secondary | ICD-10-CM | POA: Diagnosis present

## 2017-09-18 DIAGNOSIS — Z89611 Acquired absence of right leg above knee: Secondary | ICD-10-CM | POA: Diagnosis not present

## 2017-09-18 DIAGNOSIS — K7469 Other cirrhosis of liver: Secondary | ICD-10-CM | POA: Diagnosis present

## 2017-09-18 DIAGNOSIS — T83518A Infection and inflammatory reaction due to other urinary catheter, initial encounter: Principal | ICD-10-CM | POA: Diagnosis present

## 2017-09-18 DIAGNOSIS — B965 Pseudomonas (aeruginosa) (mallei) (pseudomallei) as the cause of diseases classified elsewhere: Secondary | ICD-10-CM | POA: Diagnosis not present

## 2017-09-18 DIAGNOSIS — I480 Paroxysmal atrial fibrillation: Secondary | ICD-10-CM | POA: Diagnosis present

## 2017-09-18 DIAGNOSIS — B9689 Other specified bacterial agents as the cause of diseases classified elsewhere: Secondary | ICD-10-CM

## 2017-09-18 DIAGNOSIS — I739 Peripheral vascular disease, unspecified: Secondary | ICD-10-CM | POA: Diagnosis present

## 2017-09-18 DIAGNOSIS — E875 Hyperkalemia: Secondary | ICD-10-CM

## 2017-09-18 DIAGNOSIS — A419 Sepsis, unspecified organism: Secondary | ICD-10-CM

## 2017-09-18 DIAGNOSIS — Y846 Urinary catheterization as the cause of abnormal reaction of the patient, or of later complication, without mention of misadventure at the time of the procedure: Secondary | ICD-10-CM | POA: Diagnosis present

## 2017-09-18 DIAGNOSIS — Z9911 Dependence on respirator [ventilator] status: Secondary | ICD-10-CM

## 2017-09-18 DIAGNOSIS — D638 Anemia in other chronic diseases classified elsewhere: Secondary | ICD-10-CM | POA: Diagnosis present

## 2017-09-18 DIAGNOSIS — J1569 Pneumonia due to other gram-negative bacteria: Secondary | ICD-10-CM | POA: Diagnosis present

## 2017-09-18 DIAGNOSIS — G119 Hereditary ataxia, unspecified: Secondary | ICD-10-CM

## 2017-09-18 DIAGNOSIS — N184 Chronic kidney disease, stage 4 (severe): Secondary | ICD-10-CM | POA: Diagnosis present

## 2017-09-18 DIAGNOSIS — Z931 Gastrostomy status: Secondary | ICD-10-CM | POA: Diagnosis not present

## 2017-09-18 DIAGNOSIS — R945 Abnormal results of liver function studies: Secondary | ICD-10-CM | POA: Diagnosis present

## 2017-09-18 DIAGNOSIS — R1310 Dysphagia, unspecified: Secondary | ICD-10-CM | POA: Diagnosis present

## 2017-09-18 DIAGNOSIS — Z89511 Acquired absence of right leg below knee: Secondary | ICD-10-CM

## 2017-09-18 DIAGNOSIS — J90 Pleural effusion, not elsewhere classified: Secondary | ICD-10-CM

## 2017-09-18 DIAGNOSIS — J9621 Acute and chronic respiratory failure with hypoxia: Secondary | ICD-10-CM | POA: Diagnosis not present

## 2017-09-18 DIAGNOSIS — A4151 Sepsis due to Escherichia coli [E. coli]: Secondary | ICD-10-CM

## 2017-09-18 DIAGNOSIS — S78111A Complete traumatic amputation at level between right hip and knee, initial encounter: Secondary | ICD-10-CM | POA: Insufficient documentation

## 2017-09-18 DIAGNOSIS — I5023 Acute on chronic systolic (congestive) heart failure: Secondary | ICD-10-CM | POA: Diagnosis present

## 2017-09-18 DIAGNOSIS — A4152 Sepsis due to Pseudomonas: Secondary | ICD-10-CM | POA: Diagnosis present

## 2017-09-18 DIAGNOSIS — T8383XA Hemorrhage of genitourinary prosthetic devices, implants and grafts, initial encounter: Secondary | ICD-10-CM | POA: Diagnosis present

## 2017-09-18 DIAGNOSIS — Z79899 Other long term (current) drug therapy: Secondary | ICD-10-CM

## 2017-09-18 DIAGNOSIS — J151 Pneumonia due to Pseudomonas: Secondary | ICD-10-CM | POA: Diagnosis present

## 2017-09-18 DIAGNOSIS — L89314 Pressure ulcer of right buttock, stage 4: Secondary | ICD-10-CM | POA: Diagnosis present

## 2017-09-18 DIAGNOSIS — Z978 Presence of other specified devices: Secondary | ICD-10-CM

## 2017-09-18 DIAGNOSIS — J440 Chronic obstructive pulmonary disease with acute lower respiratory infection: Secondary | ICD-10-CM | POA: Diagnosis present

## 2017-09-18 DIAGNOSIS — G8220 Paraplegia, unspecified: Secondary | ICD-10-CM

## 2017-09-18 DIAGNOSIS — Z7401 Bed confinement status: Secondary | ICD-10-CM

## 2017-09-18 DIAGNOSIS — Z87440 Personal history of urinary (tract) infections: Secondary | ICD-10-CM

## 2017-09-18 DIAGNOSIS — I4891 Unspecified atrial fibrillation: Secondary | ICD-10-CM

## 2017-09-18 DIAGNOSIS — N390 Urinary tract infection, site not specified: Secondary | ICD-10-CM | POA: Insufficient documentation

## 2017-09-18 DIAGNOSIS — R0902 Hypoxemia: Secondary | ICD-10-CM | POA: Diagnosis not present

## 2017-09-18 DIAGNOSIS — R918 Other nonspecific abnormal finding of lung field: Secondary | ICD-10-CM

## 2017-09-18 DIAGNOSIS — N136 Pyonephrosis: Secondary | ICD-10-CM | POA: Diagnosis present

## 2017-09-18 DIAGNOSIS — G319 Degenerative disease of nervous system, unspecified: Secondary | ICD-10-CM | POA: Diagnosis present

## 2017-09-18 DIAGNOSIS — R748 Abnormal levels of other serum enzymes: Secondary | ICD-10-CM | POA: Diagnosis present

## 2017-09-18 DIAGNOSIS — N319 Neuromuscular dysfunction of bladder, unspecified: Secondary | ICD-10-CM | POA: Diagnosis present

## 2017-09-18 DIAGNOSIS — I482 Chronic atrial fibrillation: Secondary | ICD-10-CM | POA: Diagnosis present

## 2017-09-18 DIAGNOSIS — Z452 Encounter for adjustment and management of vascular access device: Secondary | ICD-10-CM

## 2017-09-18 DIAGNOSIS — K7200 Acute and subacute hepatic failure without coma: Secondary | ICD-10-CM | POA: Diagnosis present

## 2017-09-18 DIAGNOSIS — Z930 Tracheostomy status: Secondary | ICD-10-CM | POA: Diagnosis not present

## 2017-09-18 DIAGNOSIS — J9611 Chronic respiratory failure with hypoxia: Secondary | ICD-10-CM

## 2017-09-18 DIAGNOSIS — Z87828 Personal history of other (healed) physical injury and trauma: Secondary | ICD-10-CM

## 2017-09-18 DIAGNOSIS — I745 Embolism and thrombosis of iliac artery: Secondary | ICD-10-CM | POA: Diagnosis present

## 2017-09-18 DIAGNOSIS — N1330 Unspecified hydronephrosis: Secondary | ICD-10-CM | POA: Diagnosis present

## 2017-09-18 DIAGNOSIS — I700 Atherosclerosis of aorta: Secondary | ICD-10-CM | POA: Insufficient documentation

## 2017-09-18 DIAGNOSIS — K8020 Calculus of gallbladder without cholecystitis without obstruction: Secondary | ICD-10-CM | POA: Diagnosis present

## 2017-09-18 DIAGNOSIS — J9690 Respiratory failure, unspecified, unspecified whether with hypoxia or hypercapnia: Secondary | ICD-10-CM

## 2017-09-18 DIAGNOSIS — R319 Hematuria, unspecified: Secondary | ICD-10-CM

## 2017-09-18 DIAGNOSIS — E039 Hypothyroidism, unspecified: Secondary | ICD-10-CM | POA: Diagnosis present

## 2017-09-18 DIAGNOSIS — Z79890 Hormone replacement therapy (postmenopausal): Secondary | ICD-10-CM

## 2017-09-18 DIAGNOSIS — Z1619 Resistance to other specified beta lactam antibiotics: Secondary | ICD-10-CM | POA: Diagnosis not present

## 2017-09-18 DIAGNOSIS — J156 Pneumonia due to other aerobic Gram-negative bacteria: Secondary | ICD-10-CM | POA: Diagnosis present

## 2017-09-18 DIAGNOSIS — I708 Atherosclerosis of other arteries: Secondary | ICD-10-CM

## 2017-09-18 DIAGNOSIS — Z993 Dependence on wheelchair: Secondary | ICD-10-CM

## 2017-09-18 DIAGNOSIS — Z7951 Long term (current) use of inhaled steroids: Secondary | ICD-10-CM

## 2017-09-18 DIAGNOSIS — B9620 Unspecified Escherichia coli [E. coli] as the cause of diseases classified elsewhere: Secondary | ICD-10-CM | POA: Diagnosis present

## 2017-09-18 DIAGNOSIS — Z960 Presence of urogenital implants: Secondary | ICD-10-CM

## 2017-09-18 DIAGNOSIS — D62 Acute posthemorrhagic anemia: Secondary | ICD-10-CM | POA: Diagnosis present

## 2017-09-18 DIAGNOSIS — Z7901 Long term (current) use of anticoagulants: Secondary | ICD-10-CM

## 2017-09-18 DIAGNOSIS — R7989 Other specified abnormal findings of blood chemistry: Secondary | ICD-10-CM

## 2017-09-18 DIAGNOSIS — F444 Conversion disorder with motor symptom or deficit: Secondary | ICD-10-CM | POA: Diagnosis present

## 2017-09-18 DIAGNOSIS — Z1624 Resistance to multiple antibiotics: Secondary | ICD-10-CM | POA: Diagnosis present

## 2017-09-18 DIAGNOSIS — J189 Pneumonia, unspecified organism: Secondary | ICD-10-CM

## 2017-09-18 DIAGNOSIS — R270 Ataxia, unspecified: Secondary | ICD-10-CM | POA: Diagnosis present

## 2017-09-18 DIAGNOSIS — R310 Gross hematuria: Secondary | ICD-10-CM | POA: Diagnosis present

## 2017-09-18 HISTORY — DX: Degenerative disease of nervous system, unspecified: G31.9

## 2017-09-18 HISTORY — DX: Paraplegia, unspecified: G82.20

## 2017-09-18 HISTORY — DX: Chronic obstructive pulmonary disease, unspecified: J44.9

## 2017-09-18 HISTORY — DX: Presence of urogenital implants: Z96.0

## 2017-09-18 HISTORY — DX: Unspecified systolic (congestive) heart failure: I50.20

## 2017-09-18 HISTORY — DX: Complete traumatic amputation at level between right hip and knee, initial encounter: S78.111A

## 2017-09-18 HISTORY — DX: Presence of other specified devices: Z97.8

## 2017-09-18 HISTORY — DX: Gastrostomy status: Z93.1

## 2017-09-18 HISTORY — DX: Chronic kidney disease, stage 4 (severe): N18.4

## 2017-09-18 HISTORY — DX: Paroxysmal atrial fibrillation: I48.0

## 2017-09-18 LAB — CBC WITH DIFFERENTIAL/PLATELET
BASOS ABS: 0.1 10*3/uL (ref 0.0–0.1)
BASOS PCT: 0 %
BASOS PCT: 1 %
Basophils Absolute: 0.1 10*3/uL (ref 0.0–0.1)
EOS ABS: 0.2 10*3/uL (ref 0.0–0.7)
EOS PCT: 2 %
Eosinophils Absolute: 0.2 10*3/uL (ref 0.0–0.7)
Eosinophils Relative: 1 %
HCT: 26.8 % — ABNORMAL LOW (ref 39.0–52.0)
HCT: 25.7 % — ABNORMAL LOW (ref 39.0–52.0)
HEMOGLOBIN: 7.3 g/dL — AB (ref 13.0–17.0)
Hemoglobin: 7.7 g/dL — ABNORMAL LOW (ref 13.0–17.0)
LYMPHS ABS: 1.2 10*3/uL (ref 0.7–4.0)
LYMPHS PCT: 14 %
Lymphocytes Relative: 10 %
Lymphs Abs: 1.6 10*3/uL (ref 0.7–4.0)
MCH: 23.2 pg — AB (ref 26.0–34.0)
MCH: 22.8 pg — ABNORMAL LOW (ref 26.0–34.0)
MCHC: 28.7 g/dL — AB (ref 30.0–36.0)
MCHC: 28.4 g/dL — ABNORMAL LOW (ref 30.0–36.0)
MCV: 80.7 fL (ref 78.0–100.0)
MCV: 80.3 fL (ref 78.0–100.0)
MONOS PCT: 8 %
Monocytes Absolute: 0.9 10*3/uL (ref 0.1–1.0)
Monocytes Absolute: 0.8 10*3/uL (ref 0.1–1.0)
Monocytes Relative: 7 %
NEUTROS ABS: 9 10*3/uL — AB (ref 1.7–7.7)
NEUTROS PCT: 77 %
Neutro Abs: 9.1 10*3/uL — ABNORMAL HIGH (ref 1.7–7.7)
Neutrophils Relative %: 80 %
PLATELETS: 262 10*3/uL (ref 150–400)
Platelets: 264 10*3/uL (ref 150–400)
RBC: 3.32 MIL/uL — ABNORMAL LOW (ref 4.22–5.81)
RBC: 3.2 MIL/uL — AB (ref 4.22–5.81)
RDW: 17.1 % — ABNORMAL HIGH (ref 11.5–15.5)
RDW: 17.1 % — AB (ref 11.5–15.5)
WBC: 11.4 10*3/uL — AB (ref 4.0–10.5)
WBC: 11.6 10*3/uL — AB (ref 4.0–10.5)

## 2017-09-18 LAB — MRSA PCR SCREENING: MRSA by PCR: NEGATIVE

## 2017-09-18 LAB — INFLUENZA PANEL BY PCR (TYPE A & B)
Influenza A By PCR: NEGATIVE
Influenza B By PCR: NEGATIVE

## 2017-09-18 LAB — COMPREHENSIVE METABOLIC PANEL
ALBUMIN: 1.9 g/dL — AB (ref 3.5–5.0)
ALK PHOS: 1379 U/L — AB (ref 38–126)
ALT: 382 U/L — ABNORMAL HIGH (ref 17–63)
ALT: 352 U/L — AB (ref 17–63)
ANION GAP: 11 (ref 5–15)
AST: 197 U/L — ABNORMAL HIGH (ref 15–41)
AST: 174 U/L — AB (ref 15–41)
Albumin: 2.2 g/dL — ABNORMAL LOW (ref 3.5–5.0)
Alkaline Phosphatase: 1094 U/L — ABNORMAL HIGH (ref 38–126)
Anion gap: 12 (ref 5–15)
BUN: 48 mg/dL — ABNORMAL HIGH (ref 6–20)
BUN: 41 mg/dL — AB (ref 6–20)
CALCIUM: 9.2 mg/dL (ref 8.9–10.3)
CHLORIDE: 100 mmol/L — AB (ref 101–111)
CO2: 28 mmol/L (ref 22–32)
CO2: 29 mmol/L (ref 22–32)
CREATININE: 0.81 mg/dL (ref 0.61–1.24)
Calcium: 9.2 mg/dL (ref 8.9–10.3)
Chloride: 101 mmol/L (ref 101–111)
Creatinine, Ser: 0.75 mg/dL (ref 0.61–1.24)
GFR calc Af Amer: >60 mL/min (ref >60)
GFR calc non Af Amer: >60 mL/min (ref >60)
GFR calc non Af Amer: >60 mL/min (ref >60)
GLUCOSE: 76 mg/dL (ref 65–99)
Glucose, Bld: 91 mg/dL (ref 65–99)
POTASSIUM: 4.5 mmol/L (ref 3.5–5.1)
POTASSIUM: 4.4 mmol/L (ref 3.5–5.1)
SODIUM: 139 mmol/L (ref 135–145)
Sodium: 142 mmol/L (ref 135–145)
TOTAL PROTEIN: 8 g/dL (ref 6.5–8.1)
Total Bilirubin: 2.6 mg/dL — ABNORMAL HIGH (ref 0.3–1.2)
Total Bilirubin: 2.6 mg/dL — ABNORMAL HIGH (ref 0.3–1.2)
Total Protein: 8 g/dL (ref 6.5–8.1)

## 2017-09-18 LAB — URINALYSIS, ROUTINE W REFLEX MICROSCOPIC
BILIRUBIN URINE: NEGATIVE
GLUCOSE, UA: NEGATIVE mg/dL
KETONES UR: NEGATIVE mg/dL
NITRITE: NEGATIVE
PH: 5 (ref 5.0–8.0)
PROTEIN: 30 mg/dL — AB
SQUAMOUS EPITHELIAL / LPF: NONE SEEN
Specific Gravity, Urine: 1.013 (ref 1.005–1.030)

## 2017-09-18 LAB — LACTIC ACID, PLASMA
LACTIC ACID, VENOUS: 0.7 mmol/L (ref 0.5–1.9)
LACTIC ACID, VENOUS: 0.9 mmol/L (ref 0.5–1.9)
Lactic Acid, Venous: 0.7 mmol/L (ref 0.5–1.9)

## 2017-09-18 LAB — I-STAT CG4 LACTIC ACID, ED: Lactic Acid, Venous: 0.62 mmol/L (ref 0.5–1.9)

## 2017-09-18 LAB — I-STAT ARTERIAL BLOOD GAS, ED
ACID-BASE EXCESS: 5 mmol/L — AB (ref 0.0–2.0)
Bicarbonate: 32 mmol/L — ABNORMAL HIGH (ref 20.0–28.0)
O2 Saturation: 92 %
TCO2: 34 mmol/L — AB (ref 22–32)
pCO2 arterial: 55.9 mmHg — ABNORMAL HIGH (ref 32.0–48.0)
pH, Arterial: 7.361 (ref 7.350–7.450)
pO2, Arterial: 65 mmHg — ABNORMAL LOW (ref 83.0–108.0)

## 2017-09-18 LAB — PROTIME-INR
INR: 2
INR: 1.63
PROTHROMBIN TIME: 19.2 s — AB (ref 11.4–15.2)
Prothrombin Time: 22.5 seconds — ABNORMAL HIGH (ref 11.4–15.2)

## 2017-09-18 LAB — APTT: aPTT: 50 seconds — ABNORMAL HIGH (ref 24–36)

## 2017-09-18 LAB — HEMOGLOBIN: HEMOGLOBIN: 7 g/dL — AB (ref 13.0–17.0)

## 2017-09-18 LAB — PROCALCITONIN: PROCALCITONIN: 1.6 ng/mL

## 2017-09-18 LAB — BRAIN NATRIURETIC PEPTIDE: B Natriuretic Peptide: 641.2 pg/mL — ABNORMAL HIGH (ref 0.0–100.0)

## 2017-09-18 MED ORDER — COLLAGENASE 250 UNIT/GM EX OINT
TOPICAL_OINTMENT | Freq: Every day | CUTANEOUS | Status: DC
  Administered 2017-09-18: 16:00:00 via TOPICAL
  Filled 2017-09-18: qty 30

## 2017-09-18 MED ORDER — METOPROLOL TARTRATE 12.5 MG HALF TABLET: 12.5000 mg | ORAL_TABLET | Freq: Two times a day (BID) | ORAL | Status: DC

## 2017-09-18 MED ORDER — PANTOPRAZOLE SODIUM 40 MG PO PACK
40.0000 mg | PACK | Freq: Every day | ORAL | Status: DC
  Administered 2017-09-19 – 2017-09-25 (×7): 40 mg
  Filled 2017-09-18 (×7): qty 20

## 2017-09-18 MED ORDER — VANCOMYCIN HCL IN DEXTROSE 1-5 GM/200ML-% IV SOLN: 1000.0000 mg | Freq: Once | INTRAVENOUS | Status: DC

## 2017-09-18 MED ORDER — VITAL AF 1.2 CAL PO LIQD
1000.0000 mL | ORAL | Status: DC
  Administered 2017-09-18 – 2017-09-25 (×6): 1000 mL
  Filled 2017-09-18 (×16): qty 1000

## 2017-09-18 MED ORDER — VANCOMYCIN HCL 10 G IV SOLR
1500.0000 mg | Freq: Once | INTRAVENOUS | Status: AC
  Administered 2017-09-18: 1500 mg via INTRAVENOUS
  Filled 2017-09-18: qty 1500

## 2017-09-18 MED ORDER — PRO-STAT SUGAR FREE PO LIQD
30.0000 mL | Freq: Two times a day (BID) | ORAL | Status: DC
  Administered 2017-09-18 – 2017-09-19 (×2): 30 mL
  Filled 2017-09-18 (×2): qty 30

## 2017-09-18 MED ORDER — DILTIAZEM 12 MG/ML ORAL SUSPENSION
30.0000 mg | Freq: Four times a day (QID) | ORAL | Status: DC
  Administered 2017-09-18 – 2017-09-25 (×27): 30 mg
  Filled 2017-09-18 (×29): qty 3

## 2017-09-18 MED ORDER — ONDANSETRON HCL 4 MG PO TABS: 4.0000 mg | ORAL_TABLET | Freq: Four times a day (QID) | ORAL | Status: DC | PRN

## 2017-09-18 MED ORDER — DEXTROSE 5 % IV SOLN
2.0000 g | Freq: Once | INTRAVENOUS | Status: AC
  Administered 2017-09-18: 2 g via INTRAVENOUS
  Filled 2017-09-18: qty 2

## 2017-09-18 MED ORDER — VITAMIN D (ERGOCALCIFEROL) 1.25 MG (50000 UNIT) PO CAPS
50000.0000 [IU] | ORAL_CAPSULE | ORAL | Status: DC
  Administered 2017-09-23: 50000 [IU]
  Filled 2017-09-18: qty 1

## 2017-09-18 MED ORDER — BACLOFEN 5 MG HALF TABLET
5.0000 mg | ORAL_TABLET | Freq: Two times a day (BID) | ORAL | Status: DC
Start: 2017-09-18 — End: 2017-09-25
  Administered 2017-09-18 – 2017-09-25 (×14): 5 mg
  Filled 2017-09-18 (×15): qty 1

## 2017-09-18 MED ORDER — GLYCOPYRROLATE 1 MG PO TABS
1.0000 mg | ORAL_TABLET | Freq: Two times a day (BID) | ORAL | Status: DC
  Administered 2017-09-18 – 2017-09-25 (×14): 1 mg
  Filled 2017-09-18 (×14): qty 1

## 2017-09-18 MED ORDER — ATORVASTATIN CALCIUM 10 MG PO TABS
10.0000 mg | ORAL_TABLET | Freq: Every day | ORAL | Status: DC
  Administered 2017-09-18: 10 mg
  Filled 2017-09-18: qty 1

## 2017-09-18 MED ORDER — POTASSIUM CHLORIDE IN NACL 20-0.9 MEQ/L-% IV SOLN
INTRAVENOUS | Status: DC
  Administered 2017-09-18: 20:00:00 via INTRAVENOUS
  Administered 2017-09-19: 1000 mL via INTRAVENOUS
  Filled 2017-09-18 (×3): qty 1000

## 2017-09-18 MED ORDER — MONTELUKAST SODIUM 10 MG PO TABS
10.0000 mg | ORAL_TABLET | Freq: Every day | ORAL | Status: DC
  Administered 2017-09-18 – 2017-09-24 (×7): 10 mg
  Filled 2017-09-18 (×7): qty 1

## 2017-09-18 MED ORDER — METOPROLOL TARTRATE 12.5 MG HALF TABLET
12.5000 mg | ORAL_TABLET | Freq: Two times a day (BID) | ORAL | Status: DC
  Administered 2017-09-18 – 2017-09-25 (×14): 12.5 mg
  Filled 2017-09-18 (×14): qty 1

## 2017-09-18 MED ORDER — IPRATROPIUM-ALBUTEROL 0.5-2.5 (3) MG/3ML IN SOLN
3.0000 mL | Freq: Three times a day (TID) | RESPIRATORY_TRACT | Status: DC
  Administered 2017-09-18 – 2017-09-25 (×20): 3 mL via RESPIRATORY_TRACT
  Filled 2017-09-18 (×20): qty 3

## 2017-09-18 MED ORDER — FUROSEMIDE 10 MG/ML IJ SOLN
40.0000 mg | Freq: Once | INTRAMUSCULAR | Status: AC
  Administered 2017-09-18: 40 mg via INTRAVENOUS
  Filled 2017-09-18: qty 4

## 2017-09-18 MED ORDER — ACETAMINOPHEN 325 MG PO TABS
650.0000 mg | ORAL_TABLET | Freq: Four times a day (QID) | ORAL | Status: DC | PRN
  Administered 2017-09-18 – 2017-09-19 (×2): 650 mg
  Filled 2017-09-18 (×2): qty 2

## 2017-09-18 MED ORDER — ADULT MULTIVITAMIN W/MINERALS CH
1.0000 | ORAL_TABLET | Freq: Every day | ORAL | Status: DC
  Administered 2017-09-19 – 2017-09-21 (×3): 1
  Filled 2017-09-18 (×3): qty 1

## 2017-09-18 MED ORDER — SODIUM CHLORIDE 0.9% FLUSH
3.0000 mL | Freq: Two times a day (BID) | INTRAVENOUS | Status: DC
  Administered 2017-09-18 – 2017-09-24 (×14): 3 mL via INTRAVENOUS

## 2017-09-18 MED ORDER — VANCOMYCIN HCL IN DEXTROSE 1-5 GM/200ML-% IV SOLN: 1000.0000 mg | INTRAVENOUS | Status: DC

## 2017-09-18 MED ORDER — ONDANSETRON HCL 4 MG/2ML IJ SOLN: 4.0000 mg | Freq: Four times a day (QID) | INTRAMUSCULAR | Status: DC | PRN

## 2017-09-18 MED ORDER — IOPAMIDOL (ISOVUE-300) INJECTION 61%
INTRAVENOUS | Status: AC
  Administered 2017-09-18: 100 mL
  Filled 2017-09-18: qty 100

## 2017-09-18 MED ORDER — FLUTICASONE PROPIONATE 50 MCG/ACT NA SUSP
2.0000 | Freq: Every day | NASAL | Status: DC
  Administered 2017-09-19 – 2017-09-25 (×7): 2 via NASAL
  Filled 2017-09-18: qty 16

## 2017-09-18 MED ORDER — FENTANYL CITRATE (PF) 100 MCG/2ML IJ SOLN: 25.0000 ug | INTRAMUSCULAR | Status: DC | PRN

## 2017-09-18 MED ORDER — SODIUM CHLORIDE 0.9 % IV SOLN
1.0000 g | Freq: Three times a day (TID) | INTRAVENOUS | Status: DC
  Administered 2017-09-18 – 2017-09-22 (×12): 1 g via INTRAVENOUS
  Filled 2017-09-18 (×14): qty 1

## 2017-09-18 MED ORDER — DARBEPOETIN ALFA 25 MCG/0.42ML IJ SOSY
25.0000 ug | PREFILLED_SYRINGE | INTRAMUSCULAR | Status: DC
  Administered 2017-09-20: 25 ug via SUBCUTANEOUS
  Filled 2017-09-18: qty 0.42

## 2017-09-18 MED ORDER — OXYCODONE HCL 5 MG PO TABS
5.0000 mg | ORAL_TABLET | ORAL | Status: DC | PRN
  Administered 2017-09-23: 5 mg via JEJUNOSTOMY
  Filled 2017-09-18: qty 1

## 2017-09-18 MED ORDER — FREE WATER
200.0000 mL | Freq: Three times a day (TID) | Status: DC
  Administered 2017-09-18 – 2017-09-24 (×19): 200 mL

## 2017-09-18 MED ORDER — CHLORHEXIDINE GLUCONATE 0.12% ORAL RINSE (MEDLINE KIT)
15.0000 mL | Freq: Two times a day (BID) | OROMUCOSAL | Status: DC
  Administered 2017-09-18 – 2017-09-24 (×14): 15 mL via OROMUCOSAL

## 2017-09-18 MED ORDER — DARBEPOETIN ALFA 25 MCG/ML IJ SOLN: 25.0000 ug | INTRAMUSCULAR | Status: DC

## 2017-09-18 MED ORDER — CEFEPIME HCL 2 G IJ SOLR: 2.0000 g | INTRAMUSCULAR | Status: DC

## 2017-09-18 MED ORDER — SIMETHICONE 40 MG/0.6ML PO SUSP (UNIT DOSE)
40.0000 mg | Freq: Four times a day (QID) | ORAL | Status: DC | PRN
  Administered 2017-09-21 – 2017-09-24 (×2): 40 mg
  Filled 2017-09-18 (×3): qty 0.6

## 2017-09-18 MED ORDER — POLYETHYLENE GLYCOL 3350 17 G PO PACK
17.0000 g | PACK | Freq: Every day | ORAL | Status: DC
  Administered 2017-09-20 – 2017-09-25 (×5): 17 g via ORAL
  Filled 2017-09-18 (×6): qty 1

## 2017-09-18 MED ORDER — LEVOTHYROXINE SODIUM 100 MCG PO TABS
125.0000 ug | ORAL_TABLET | Freq: Every day | ORAL | Status: DC
Start: 2017-09-19 — End: 2017-09-25
  Administered 2017-09-19 – 2017-09-25 (×7): 125 ug
  Filled 2017-09-18 (×8): qty 1

## 2017-09-18 NOTE — Progress Notes (Signed)
Pt transferred to 3M09 on ventilator by RT. VS stable throughout.

## 2017-09-18 NOTE — ED Notes (Addendum)
Delay in lab draw,   Xray in the room at this time.

## 2017-09-18 NOTE — H&P (Signed)
History and Physical    Craig Oneal. PNP:[id number] DOB: 17-Jan-1951 DOA: 09/18/2017  PCP: System, Pcp Not In   Patient coming from: [facility]  I have personally briefly reviewed patient's old medical records in [place]  Chief Complaint: Blood-tinged urine in Foley catheter noted this morning at [facility].  HPI: Craig Oneal. is a 67 y.o. 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 Coumadin was stopped.He was given 5 mg of vitamin K at [facility] before transfer. Patient also complaining of abdominal pain has not had a bowel movement in 2 or 3 days.  Earlier this month he was found to have positive urinalysis for UTI.  He grew out a multidrug resistant E. coli sensitive to meropenem.  In the emergency department CXR showed increased interstitial prominence.  WBC 11.4, hyperthermia, tachycardia, tachypnea, oxygen saturation session 88%. BNP 641, INR 2.0, lactic acid 0.62, blood pressure 178/85. Patient had history of VRE UTI and Pseudomonas pneumonia recently. Pt was started with vancomycin and cefepime.  Patient was switched to meropenem by Dr. Marinus Maw when he reviewed the chart overnight.  I was asked to admit the patient.  Review of records from the nursing facility shows that on January 11 he grew out a multidrug resistant E. coli sensitive to meropenem.  He also had a sputum culture which was sent which was growing positives and gram negatives.  I personally called [facility] [place] and found that the patient  was growing Serratia marcescens and Moraxella catarrhalis with heavy growth.  He also had light growth of Pseudomonas which I believe may be a colonizer for him.  Review of the sensitivities to these organisms after results were faxed to me by the lab in [place] reflected that the patient would probably be okay on just meropenem.  I spoke with Dr. Megan Salon from ID who confirmed that meropenem would be an appropriate treatment for all 3 of these organisms.  Additionally the patient has a stage IV decubitus on his right ischium, his hemoglobin has dropped to 7.7, he has a new noted elevation in his liver function tests with an elevated total bilirubin and imaging of the abdomen is consistent with early cirrhosis.  That CT scan also revealed that the patient's Foley catheter was in his ureter and has been adjusted by ED nursing staff.  He also had a moderate stool burden, a right common iliac occlusion, severe aortic atherosclerosis advanced, bilateral pleural effusions,  On presentation to the emergency department he was initially hypoxic and then required 40% Ventimask however given his trach collar he was converted to ventilator as he uses a ventilator at night usually.  Chest x-ray was consistent with pulmonary edema versus infection and his EKG showed atrial fibrillation with BCs.  Given his severe complexity with associated multiple medical problems, recent history of vancomycin resistant enterococcus UTI in December 2018, recent right BKA in November 2018, cerebellar and medullary atrophy is noted [facility] as well as a feeding tube trach collar patient will be admitted into the hospital  into the stepdown unit for further close monitoring.  Please note that the patient is unable to provide any history when I read his lips all I can understand is "I want to go home".  The entirety of the history is obtained from the patient's medical record, calling the  [facility] in [place], and records [facility].  Review of Systems: Patient is unable to participate I cannot get him to shake his head or nod with review of systems questions  Past Medical History:  Diagnosis Date  . A-fib ([place])   . Hx of AKA (above knee amputation), right ([place])   . Tracheostomy in place [facility])     Past Surgical History:  Procedure Laterality Date  . IR [place] TUBE PERCUT W/FLUORO  08/03/2017     reports that  has never smoked. he has never used smokeless tobacco. He reports that he does not drink alcohol or use drugs.  No Known Allergies  Family History  Family history unknown: Yes     Prior to Admission medications   Medication Sig Start Date End Date Taking? Authorizing Provider  acetaminophen (TYLENOL) 325 MG tablet Place 650 mg into feeding tube every 6 (six) hours as needed for mild pain (temp greater than than 100.6).     [provider]  Amino Acids-Protein Hydrolys (FEEDING SUPPLEMENT, PRO-STAT SUGAR FREE 64,) LIQD Place 30 mLs into feeding tube 2 (two) times daily. 08/20/17   Allie Bossier, MD  atorvastatin (LIPITOR) 10 MG tablet Place 1 tablet (10 mg total) into feeding tube at bedtime. 08/20/17   Allie Bossier, MD  baclofen 5 MG TABS Place 5 mg into feeding tube 2 (two) times daily. 08/20/17   Allie Bossier, MD  chlorhexidine gluconate, MEDLINE KIT, (PERIDEX) 0.12 % solution 15 mLs by Mouth Rinse route 2 (two) times daily. 08/20/17   Allie Bossier, MD  collagenase (SANTYL) ointment Apply topically daily. Apply Santyl to right buttock wound Q day, then cover with moist 2X2 and foam dressing.  (Change foam dressing Q 3 days or PRN soiling.) 08/21/17   Allie Bossier, MD  Darbepoetin Alfa 25 MCG/ML SOLN Inject 25 mcg as directed every 28 (twenty-eight) days.     [provider]  diltiazem (CARDIZEM) 10 mg/ml oral suspension Place 3 mLs (30 mg total) into feeding tube every 6 (six) hours. 08/20/17    Allie Bossier, MD  enoxaparin (LOVENOX) 80 MG/0.8ML injection Inject 0.75 mLs (75 mg total) into the skin every 12 (twelve) hours. 08/20/17   Allie Bossier, MD  fentaNYL (SUBLIMAZE) 100 MCG/2ML injection Inject 0.5-1 mLs (25-50 mcg total) into the vein every 2 (two) hours as needed for severe pain. 08/20/17   Allie Bossier, MD  fluticasone (FLONASE) 50 MCG/ACT nasal spray Place 2 sprays into both nostrils daily.     [provider]  glycopyrrolate (ROBINUL) 1 MG tablet Place 1 tablet (1 mg total) into feeding tube 2 (two) times daily. 08/20/17   Allie Bossier, MD  ipratropium-albuterol (DUONEB) 0.5-2.5 (3) MG/3ML SOLN Take 3 mLs by nebulization 3 (three) times daily. 08/20/17   Allie Bossier, MD  levothyroxine (SYNTHROID, LEVOTHROID) 125 MCG tablet Place 1 tablet (125 mcg total) into feeding tube daily before breakfast. 08/21/17   Allie Bossier, MD  linezolid (ZYVOX) 600 MG/300ML IVPB Inject 300 mLs (600 mg total) into the vein every 12 (twelve) hours. 08/20/17   Allie Bossier, MD  loratadine (CLARITIN) 10 MG  tablet Place 10 mg into feeding tube daily.     [provider]  metoprolol tartrate (LOPRESSOR) 25 MG tablet Place 0.5 tablets (12.5 mg total) into feeding tube 2 (two) times daily. 01/25/17   Chesley Mires, MD  montelukast (SINGULAIR) 10 MG tablet Place 10 mg into feeding tube at bedtime.     [provider]  Multiple Vitamin (MULTIVITAMIN WITH MINERALS) TABS tablet Place 1 tablet into feeding tube daily.     [provider]  Nutritional Supplements (FEEDING SUPPLEMENT, VITAL AF 1.2 CAL,) LIQD Place 1,000 mLs into feeding tube continuous. 08/20/17   Allie Bossier, MD  pantoprazole sodium (PROTONIX) 40 mg/20 mL PACK Place 20 mLs (40 mg total) into feeding tube daily. 08/20/17   Allie Bossier, MD  simethicone (MYLICON) 40 XT/0.2IO SUSP Place 0.6 mLs (40 mg total) into feeding tube every 6 (six) hours as needed for flatulence. 08/20/17   Allie Bossier, MD  Vitamin D, Ergocalciferol, (DRISDOL) 50000 units CAPS capsule Place 50,000 Units into feeding tube every Friday.     [provider]  warfarin (COUMADIN) 10 MG tablet Take 1 tablet (10 mg total) by mouth daily at 6 PM. 08/21/17   Allie Bossier, MD  Water For Irrigation, Sterile (FREE WATER) SOLN Place 200 mLs into feeding tube every 8 (eight) hours. 08/20/17   Allie Bossier, MD    Physical Exam: Vitals:   09/18/17 0734 09/18/17 0800 09/18/17 0830 09/18/17 0900  BP: (!) 147/130 (!) 143/82 (!) 153/62 130/68  Pulse: (!) 112 (!) 128 (!) 107 85  Resp: (!) _0 Temp:      TempSrc:      SpO2: 98% 98% 100% 99%  Weight:       .TCS Constitutional: Tachypneic on the ventilator minimal accessory muscle use Vitals:   09/18/17 0734 09/18/17 0800 09/18/17 0830 09/18/17 0900  BP: (!) 147/130 (!) 143/82 (!) 153/62 130/68  Pulse: (!) 112 (!) 128 (!) 107 85  Resp: (!) _1 Temp:      TempSrc:      SpO2: 98% 98% 100% 99%  Weight:       Eyes: PERRL, lids and conjunctivae normal ENMT: Mucous membranes are dry. Posterior pharynx clear of any exudate or lesions.Normal dentition.  Neck: normal, supple, no masses, no thyromegaly Respiratory: Increased respiratory effort with accessory muscle use, bilateral rhonchi appreciated.  Cardiovascular: Irregularly irregular rate and rhythm, no murmurs / rubs / gallops. No extremity edema. 2+ pedal pulses on left lower extremity. No carotid bruits.  Abdomen: no tenderness, no masses palpated. No hepatosplenomegaly. Bowel sounds positive.  Abdomen distended and tympanitic, Foley catheter in place Musculoskeletal: no clubbing / cyanosis. No joint deformity upper and left lower extremities. Good ROM, no contractures. Normal muscle tone.  Status post right above-the-knee amputation Skin: no rashes, lesions. No induration.  Stage IV right ischial decubitus ulcer with dressing with moderate to minimal serous drainage Neurologic:  Right upper extremity hemiplegia moves his stump, can move his lower extremity and right upper extremity does not follow commands Psychiatric: Alert and trying to communicate but I am not sure that he understands   Labs on Admission: I have personally reviewed following labs and imaging studies  CBC: Recent Labs  Lab 09/18/17 0347  WBC 11.4*  NEUTROABS 9.1*  HGB 7.7*  HCT 26.8*  MCV 80.7  PLT 973   Basic Metabolic Panel: Recent Labs  Lab 09/18/17 0347  NA 139  K 4.5  CL 100*  CO2 28  GLUCOSE 91  BUN 48*  CREATININE 0.75  CALCIUM 9.2   GFR: Estimated Creatinine Clearance: 99.4 mL/min (by C-G formula based on SCr of 0.75 mg/dL). Liver Function Tests: Recent Labs  Lab 09/18/17 0347  AST 197*  ALT 382*  ALKPHOS 1,094*  BILITOT 2.6*  PROT 8.0  ALBUMIN 2.2*   No results for input(s): LIPASE, AMYLASE in the last 168 hours. No results for input(s): AMMONIA in the last 168 hours. Coagulation Profile: Recent Labs  Lab 09/18/17 0347  INR 2.00   Cardiac Enzymes: No results for input(s): CKTOTAL, CKMB, CKMBINDEX, TROPONINI in the last 168 hours. BNP (last 3 results) No results for input(s): PROBNP in the last 8760 hours. HbA1C: No results for input(s): HGBA1C in the last 72 hours. CBG: No results for input(s): GLUCAP in the last 168 hours. Lipid Profile: No results for input(s): CHOL, HDL, LDLCALC, TRIG, CHOLHDL, LDLDIRECT in the last 72 hours. Thyroid Function Tests: No results for input(s): TSH, T4TOTAL, FREET4, T3FREE, THYROIDAB in the last 72 hours. Anemia Panel: No results for input(s): VITAMINB12, FOLATE, FERRITIN, TIBC, IRON, RETICCTPCT in the last 72 hours. Urine analysis:    Component Value Date/Time   COLORURINE AMBER (A) 09/18/2017 0332   APPEARANCEUR CLOUDY (A) 09/18/2017 0332   LABSPEC 1.013 09/18/2017 0332   PHURINE 5.0 09/18/2017 0332   GLUCOSEU NEGATIVE 09/18/2017 0332   HGBUR LARGE (A) 09/18/2017 0332   BILIRUBINUR NEGATIVE 09/18/2017 0332    KETONESUR NEGATIVE 09/18/2017 0332   PROTEINUR 30 (A) 09/18/2017 0332   NITRITE NEGATIVE 09/18/2017 0332   LEUKOCYTESUR LARGE (A) 09/18/2017 0332    Radiological Exams on Admission: Ct Abdomen Pelvis W Contrast  Result Date: 09/18/2017 CLINICAL DATA:  67 year old male with hematuria and abnormal LFT. EXAM: CT ABDOMEN AND PELVIS WITH CONTRAST TECHNIQUE: Multidetector CT imaging of the abdomen and pelvis was performed using the standard protocol following bolus administration of intravenous contrast. CONTRAST:  155m ISOVUE-300 IOPAMIDOL (ISOVUE-300) INJECTION 61% COMPARISON:  Abdominal radiograph dated 09/18/2017 and CT of the abdomen pelvis dated 08/04/2017 and 08/02/2017. FINDINGS: Evaluation is limited due to streak artifact caused by patient's arms. Lower chest: Partially visualized small bilateral pleural effusions. There is cardiomegaly with coronary vascular calcification. Partially visualized tip of the catheter in the right atrium. There is diffuse interstitial prominence consistent with CHF. Bibasilar subsegmental atelectasis versus infiltrate noted. No intra-abdominal free air. Small free fluid adjacent to the liver. Hepatobiliary: Minimal irregularity of the hepatic contour concerning for early changes of cirrhosis. Clinical correlation is recommended. A 12 mm subcapsular hypodense focus in the right lobe of the liver (series 3, image 30) is not characterized but may represent an area of scarring. This is similar to the CT of 08/02/2017. there is no intrahepatic biliary ductal dilatation. Layering sludge or small stones noted in the gallbladder. No pericholecystic fluid or evidence of acute cholecystitis by CT. Pancreas: Unremarkable. No pancreatic ductal dilatation or surrounding inflammatory changes. Spleen: Normal in size without focal abnormality. Adrenals/Urinary Tract: The adrenal glands are unremarkable. Mild bilateral hydronephrosis. There is interval improvement of the right-sided  hydronephrosis compared to the prior CT. There is a 1 cm left renal cyst. Subcentimeter left renal hypodense lesion is not well characterized. There is mild enhancement of the urothelium. Correlation with urinalysis recommended to exclude UTI. No stone identified. The urinary bladder is unremarkable. A Foley catheter is seen with balloon in the prostatic urethra and tip just entering the bladder. Recommend further advancing  of the Foley into the bladder. Stomach/Bowel: A percutaneous gastrostomy is noted with balloon and tip in the body of the stomach. Moderate amount of stool noted throughout the colon. There is no bowel obstruction or active inflammation. Vascular/Lymphatic: There is moderate aortoiliac atherosclerotic disease. 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 There is reconstitution of the flow in the distal right external iliac artery. The origins of the celiac axis, SMA remain patent. No portal venous gas. There is no adenopathy. Mildly rounded lymph node along the right iliac chain measures 9 mm in diameter similar to prior CT. Reproductive: Mildly enlarged prostate gland measures 5.5 cm in diameter. Other: Right ischial deep decubitus ulcer extends close to the ischial tuberosity. No evidence of osteomyelitis by CT. No drainable fluid collection or abscess. Musculoskeletal: No acute osseous pathology. IMPRESSION: 1. Cardiomegaly with findings of CHF, small bilateral pleural effusions, and bibasilar atelectasis versus infiltrate. Clinical correlation is recommended. 2. Mild bilateral hydronephrosis. Correlation with urinalysis recommended to exclude UTI. 3. Foley catheter with balloon in the prostatic urethra. Recommend further advancing the catheter into the bladder. 4. No bowel obstruction or active inflammation. Percutaneous gastrostomy within the stomach. 5. Slight irregularity of the hepatic contour concerning for early  changes of cirrhosis. Clinical correlation is recommended. 6. Layering sludge or small stones. 7. Advanced Aortic Atherosclerosis (ICD10-I70.0). 8. 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 This is of indeterminate chronicity but likely chronic. Clinical correlation is recommended. 9. Right ischial decubitus ulcer. No fluid collection or abscess. No evidence of osteomyelitis by CT. Clinical correlation is recommended. Electronically Signed   By: Anner Crete M.D.   On: 09/18/2017 06:19   Dg Chest Portable 1 View  Result Date: 09/18/2017 CLINICAL DATA:  Shortness of breath, on treatment for urinary tract infection. EXAM: PORTABLE CHEST 1 VIEW COMPARISON:  Chest radiograph August 15, 2017 FINDINGS: The cardiac silhouette is mildly enlarged and unchanged. Increasing interstitial prominence with retrocardiac consolidation. Small pleural effusions. No pneumothorax. Tracheostomy tube tip projects 5.3 cm above the carina. RIGHT PICC distal tip projecting cavoatrial junction. No pneumothorax. Soft tissue planes and included osseous structures are nonacute. Osteopenia. IMPRESSION: Cardiomegaly. Increasing interstitial prominence seen with atypical infection or pulmonary edema with retrocardiac consolidation. Small pleural effusions. RIGHT PICC distal tip projects at cavoatrial junction. Tracheostomy tube in situ. Electronically Signed   By: Elon Alas M.D.   On: 09/18/2017 04:08   Dg Abd Portable 2 Views  Result Date: 09/18/2017 CLINICAL DATA:  Shortness of breath, on treatment for urinary tract infection. No bowel movement for 4 days. EXAM: PORTABLE ABDOMEN - 2 VIEW COMPARISON:  Abdominal radiograph August 17, 2017 FINDINGS: Gastrostomy tube projects in stomach. Bowel gas pattern is nondilated and nonobstructive. Large body habitus. Phleboliths project in the pelvis. No intra-abdominal mass effect. Limited  assessment for free air due to habitus. Soft tissue planes and included osseous structures are unchanged. IMPRESSION: Nonspecific bowel gas pattern.  Gastrostomy tube in situ. Electronically Signed   By: Elon Alas M.D.   On: 09/18/2017 04:10   US Abdomen Limited Ruq  Result Date: 09/18/2017 CLINICAL DATA:  Elevated LFTs EXAM: ULTRASOUND ABDOMEN LIMITED RIGHT UPPER QUADRANT COMPARISON:  CT 09/18/2017 FINDINGS: Gallbladder: Small layering gallstones and sludge within the gallbladder. Small amount of pericholecystic fluid. Normal wall thickness. Negative sonographic Murphy's. Common bile duct: Diameter: Normal caliber, 5-6 mm. Liver:  Slightly nodular contours of the liver suggests the possibility of early cirrhosis. No focal hepatic abnormality. Portal vein is patent on color Doppler imaging with normal direction of blood flow towards the liver. IMPRESSION: Layering gall stones and sludge within the gallbladder. There is a small amount of pericholecystic fluid. There is no wall thickening or sonographic Murphy's sign. This fluid could be related to liver disease. Mildly nodular contours of the liver suggests cirrhosis. Electronically Signed   By: Rolm Baptise M.D.   On: 09/18/2017 07:42    EKG: Independently reviewed.  Atrial fibrillation with PVCs.  Assessment/Plan Principal Problem:   Sepsis ([place]) Active Problems:   Acute on chronic respiratory failure with hypoxemia (HCC)   E-coli UTI   Pneumonia due to Serratia marcescens (HCC)   Moraxella catarrhalis pneumonia (HCC)   Decubitus ulcer of right ischial area, stage IV (HCC)   Atrial fibrillation with rapid ventricular response (HCC)   Bilateral hydronephrosis   Acute posthemorrhagic anemia   Chronic bilateral pleural effusions   Cerebellar atrophy   Amputation of right lower extremity above knee with complication ([place])   Tracheostomy status ([place])   Ventilator dependence ([place])   Chronic indwelling Foley catheter   Other cirrhosis of  liver ([place])   Gastrostomy tube dependent ([place])   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  Sepsis: Patient will be admitted into the hospital he is an extraordinarily complicated man with multiple medical problems many of which are currently active.  We will give him fluid resuscitation and start him on meropenem.  He was given vancomycin in the emergency department before recent culture results were noted.  Please note that recent cultures from January 11 of the urine and the sputum revealed E. coli which is multidrug-resistant in the urine but sensitive to meropenem and Serratia heavy growth as well as beta-lactamase positive Moraxella in the tracheal aspirate.  These fortunately will be treated with meropenem.  I have therefore discontinued the vancomycin.  I did discuss the case with Dr. Megan Salon from infectious diseases who will see the patient.  I have also consulted Dr. Nelda Marseille from critical care.  2.  Acute on chronic respiratory failure with hypoxemia: Patient with chronic respiratory failure requiring ventilator at night.  Now given his sepsis he seems to be having a worse time with his oxygenation as well as the fact that he has pneumonia.  He is responding well on the ventilator and will continue this treatment.  Appreciate pulmonary input.  3.  E. coli UTI: Multiple drug resistant but sensitive to meropenem which was started in the emergency department.  4.  Pneumonia due to Serratia marcescens: Sensitive to meropenem we will continue current care started in the emergency department.  5.  Moraxella catarrhalis pneumonia: Continue treatment with meropenem started in the emergency department.  6.  Decubitus ulcer of right ischial area stage IV: Continue current dressing changes.  Would consider wound consult in a.m.  7.  Atrial fibrillation with rapid ventricular response: Currently rate controlled on his home  dose of diltiazem will continue same.  8.  Bilateral hydronephrosis: Likely due to placement of Foley catheter balloon in ureter.  This has been adjusted by nursing staff.  9.  Posthemorrhagic anemia: Patient with significant hematuria due to catheter balloon being in his ureter.  Will continue to hold his anticoagulation.  He will receive SCDs for DVT prophylaxis on the left lower extremity as he does  not have a right lower extremity.  10.  Chronic bilateral pleural effusions: Noted and appear to be unchanged from prior.  11.  Cerebellar atrophy: Noted on MRI obtained through care everywhere advised in hospital in the spring 2017.  The patient prior to that had a l long-standing complaints of weakness with difficulty with lower extremity movement and had progressive problems with his lower extremity function  12.  Amputation of right lower extremity above the knee with complications: Patient had to undergo above-the-knee amputation in November 2017 and did require blood transfusion after that.  13.  Tracheostomy status: Continue her present ventilator care.  Will need trach collar when weaned.  14.  Ventilator dependence at night: Related either to his cerebellar atrophy or possibly related to body habitus and sleep apnea.  Either way patient is currently stable on ventilator.  15.  Chronic indwelling Foley catheter: Probably related to atrophy of his spinal cord and medulla as noted on MRI with resulting bladder dysfunction  16.  Cirrhosis of the liver of unknown source: Would suggest review of medications once patient is a little bit more stable to determine if medications may be contributing to this.  I do not know what his prior history was regarding alcohol or substance use and this should probably be looked into as well.  Until history is better understood not sure that laboratory studies for hepatitis would be valuable at this point.  17.  Gastrostomy tube dependent: Continue tube feeding  as per home 18.  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  19.  Atherosclerosis of the aortic bifurcation and common iliac arteries: This peripheral vascular disease is what led to his above-the-knee amputation.  20.  Goals of care: Currently patient is a full code.  Discussion with his daughter who is his next of kin may be helpful.  She was not present at the time of my evaluation.   DVT prophylaxis: SCDs to left lower extremity Code Status: Full code Family Communication: Unable to reach anyone no family present Disposition Plan: Back to [facility] in 4-5 days Consults called: Dr. Nelda Marseille with critical care: Dr. Megan Salon with infectious disease. Admission status: Inpatient  I spent 173 minutes in care of this patient is care is extremely complicated as his records here are limited and review of previous records was somewhat helpful.  His multiple medical problems are extremely complicated including a chronic and severe believe progressive neurological disorder, multiple infections with drug resistant organisms, new problem with cirrhosis which will require evaluation and requesting of old records, new anemia, chronic stage IV skin decubitus, and acute respiratory failure, sepsis, pneumonia with 2 organisms and urinary tract infection with a drug resistant organism.   Lady Deutscher MD FACP [facility] Pager [phone]  If 7PM-7AM, please contact night-coverage [email or web address] Password [facility]  09/18/2017, 10:13 AM

## 2017-09-18 NOTE — Consult Note (Signed)
[facility] for Infectious Disease    Date of Admission:  09/18/2017           Day 1 meropenem       Reason for Consult: Possible HCAP and E. coli UTI    Referring Provider: Dr. Randa Spike  Assessment: This is a very difficult, complex situation.  The records from [place] facility are incomplete.  I do not know what antibiotics he has been on most recently.  He is certainly at high risk for symptomatic E. coli urinary tract infection given the trauma and hematuria he had yesterday.  I agree with meropenem to cover for that possibility.    His chest x-ray is definitely worse than his last film here in December.  He has bilateral infiltrates.  Given that he was hypoxic upon arrival I assume that he has healthcare associated pneumonia.  Meropenem will cover his recent Serratia isolate's but not Pseudomonas.  I will add ciprofloxacin.  Plan: 1. Continue meropenem 2. Start ciprofloxacin 3. 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 atorvastatin  10 mg Per Tube QHS  . baclofen  5 mg Per Tube BID  . chlorhexidine gluconate (MEDLINE KIT)  15 mL Mouth Rinse BID  . collagenase   Topical Daily  .  [START ON 09/20/2017] Darbepoetin Alfa  25 mcg Subcutaneous Q28 days  . diltiazem  30 mg Per Tube Q6H  . feeding supplement (PRO-STAT SUGAR FREE 64)  30 mL Per Tube BID  . fluticasone  2 spray Each Nare Daily  . free water  200 mL Per Tube Q8H  . glycopyrrolate  1 mg Per Tube BID  . ipratropium-albuterol  3 mL Nebulization TID  . [START ON 09/19/2017] levothyroxine  125 mcg Per Tube QAC breakfast  . metoprolol tartrate  12.5 mg Per Tube BID  . montelukast  10 mg Per Tube QHS  . multivitamin with minerals  1 tablet Per Tube Daily  . pantoprazole sodium  40 mg Per Tube Daily  . polyethylene glycol  17 g Oral Daily  . sodium chloride flush  3 mL Intravenous Q12H  . [START ON 09/23/2017] Vitamin D (Ergocalciferol)  50,000 Units Per Tube Q Fri   Continuous Infusions: . 0.9 % NaCl with KCl 20 mEq / L    . feeding supplement (VITAL AF 1.2 CAL)    . meropenem (MERREM) IV Stopped (09/18/17 1603)   PRN Meds:.acetaminophen, ondansetron **OR** ondansetron (ZOFRAN) IV, oxyCODONE, simethicone  HPI: Craig Oneal. is a 67 y.o. male with paraplegia and chronic respiratory failure.  He was admitted here from 08/01/2017 until 08/20/2017.  He was treated for Candida albicans fungemia during that time.  He was transferred  to [facility] skilled nursing facility.  He was recently treated for VRE UTI.  He was transferred back here yesterday after he developed hematuria.  He is on warfarin for atrial fibrillation.  After arrival here a CT scan showed that his Foley catheter balloon was inflated in his prostatic urethra.  He was noted to be hypothermic and hypoxic.  A recent urine culture at [facility] on 09/11/2017 grew an ESBL E. coli.  Sputum culture on 09/14/1998 grew Serratia, Moraxella and Pseudomonas.  The Pseudomonas was resistant to carbapenem antibiotics.   Review of Systems: Review of Systems  Unable to perform ROS: Mental acuity    Past Medical History:  Diagnosis Date  . Cerebellar atrophy   . Chronic  indwelling Foley catheter   . Chronic kidney disease (CKD), stage IV (severe) ([place])   . COPD (chronic obstructive pulmonary disease) ([place])   . Paraplegia ([place])   . Paroxysmal atrial fibrillation (HCC)   . PEG (percutaneous endoscopic gastrostomy) status ([place])   . Systolic heart failure ([place])   . Tracheostomy in place [facility])   . Unilateral AKA, right (HCC)     Social History   Tobacco Use  . Smoking status: Never Smoker  . Smokeless tobacco: Never Used  Substance Use Topics  . Alcohol use: No    Frequency: Never  . Drug use: No    Family History  Family history unknown: Yes   No Known Allergies  OBJECTIVE: Blood pressure (!) 111/53, pulse (!) 109, temperature 98.7 F (37.1 C), temperature source Oral, resp. rate (!) 21, height _0  (1.778 m), weight 171 lb 11.8 oz (77.9 kg), SpO2 95 %.  Physical Exam  Constitutional:  He is resting quietly in bed.  He has a tracheostomy tube, PEG tube and Foley catheter.  Cardiovascular:  No murmur heard. Irregularly irregular heart rhythm.  Abdominal:  Coarse breath sounds bilaterally.    Lab Results Lab Results  Component Value Date   WBC 11.6 (H) 09/18/2017   HGB 7.3 (L) 09/18/2017   HCT 25.7 (L) 09/18/2017   MCV 80.3 09/18/2017   PLT 264 09/18/2017    Lab Results  Component Value Date   CREATININE 0.81 09/18/2017   BUN 41 (H) 09/18/2017   NA 142 09/18/2017   K 4.4 09/18/2017   CL 101 09/18/2017   CO2 29 09/18/2017    Lab Results  Component Value Date   ALT 352 (H) 09/18/2017   AST 174 (H) 09/18/2017   ALKPHOS 1,379 (H) 09/18/2017   BILITOT 2.6 (H) 09/18/2017     Microbiology: Recent Results (from the past 240 hour(s))  MRSA PCR Screening     Status: None   Collection Time: 09/18/17 12:20 PM  Result Value Ref Range Status   MRSA by PCR NEGATIVE NEGATIVE Final    Comment:        The GeneXpert MRSA Assay (FDA approved for NASAL specimens only), is one component of a comprehensive MRSA  colonization surveillance program. It is not intended to diagnose MRSA infection nor to guide or monitor treatment for MRSA infections.     Michel Bickers, MD [facility] [place] [facility] [phone] pager   [phone] cell 09/18/2017, 4:38 PM

## 2017-09-18 NOTE — Plan of Care (Signed)
Discussed with patient plan of care and pain management with no teach back displayed at this time.

## 2017-09-18 NOTE — Care Management (Signed)
This is a no charge note  Pending admission per Dr. Manus Gunningancour  67 year old male with past medical history of paraplegia with ataxia, tracheostomy 07/2016 with chronic respiratory failure with chronic peg and foley, recent right AKA November 2018, CKD, PAF (on coumadin), COPD, SCHF with EF 40%, and hypothyroidism, vent dependent at night only, on trach collar during the day, who is [facility] with complaints of blood-tinged urine in Foley catheter noticed this morning. Coumadin was stopped. He was [facility] 5 [facility].  Patient also complaining of abdominal pain has not had a bowel movement in 2 or 3 days.  Margarita GrizzleWoodruff was found to have positive urinalysis for UTI, CXR showed increased interstitial prominence.  WBC 11.4, hyperthermia, tachycardia, tachypnea, oxygen saturation session 88%. BNP 641, INR 2.0, lactic acid 0.62, blood pressure 178/85. Patient had history of VRE UTI and Pseudomonas pneumonia recently. Pt was started with vancomycin and cefepime. I switched to cefepime to meropenem. Patient is admitted to stepdown as inpatient.PCCM consulted-->recommended stepdown admission, they will consult on this patient.  Lorretta HarpXilin Mekhi Lascola, MD  [facility] Pager [phone]  If 7PM-7AM, please contact night-coverage [email or web address] Password [email or web address] 09/18/2017, 6:09 AM

## 2017-09-18 NOTE — Progress Notes (Addendum)
Pharmacy Antibiotic Note  Craig DiegoCharlie J Pinela Jr. is a 67 y.o. male admitted on 09/18/2017 from [facility] with hematuria due to warfarin. He is a paraplegic and vent dependent/TC. Starting empiric abx for possible pneumonia.    Plan: -Vancomycin 1500 mg IV x1 then 1g/24h -Cefepime 2g IV x1 then 2 g IV q24h -Monitor renal fx, cultures, VR as needed     Temp (24hrs), Avg:96.7 F (35.9 C), Min:96.7 F (35.9 C), Max:96.7 F (35.9 C)  Recent Labs  Lab 09/18/17 0414  LATICACIDVEN 0.62    CrCl cannot be calculated (Patient's most recent lab result is older than the maximum 21 days allowed.).     Antimicrobials this admission: 1/13 cefepime > 1/13 vancomycin >  Dose adjustments this admission: N/A  Microbiology results: 1/13 urine cx: 1/13 blood cx:   Craig FridayMasters, Craig Oneal 09/18/2017 4:17 AM   Addendum -Changing cefepime to meropenem -Meropenem 1g/8h  Craig Oneal, Craig Oneal 09/18/2017 6:28 AM

## 2017-09-18 NOTE — Plan of Care (Deleted)
Craig Oneal is a 67 year old male who [facility] with 3 weeks of right sided pectoral pain with an associated pop in his chest 3 - 4 days ago.  When he had a pop in his chest he said he felt better for approximately 3 or 4 days.  Over the past 12-24 hours however the pain has returned and worsened and the patient has developed shortness of breath.  In the emergency department he was found to have decreased breath sounds on the right and the chest x-ray showed a large pleural effusion with an infiltrate in the right middle lobe and right lower lobe it was consolidated.  A CT scan was ordered due to high d-dimer(2.4) and it showed a loculated right hydropneumothorax with areas of locules of gas.  The differential was empyema versus bronchopleural fistula.  The patient's oxygenation was 88% initially and then on 2 L of oxygen it is gone into the 90s.  His initial blood gas was a pH of 7.53 PCO2 of 31, a PO2 of 38, and O2 sats of 86%.  He was placed on 2 L nasal cannula with sats now in the 90s. Note I checked our records and back in 2008 the patient had an ultrasound which revealed some liver abnormalities which concerned me for possible cirrhotic liver disease.  Initial vital signs on presentation was a temperature of 97 for which went up to 102.4 rectally a pulse of 108 now down to 103, respirations of 18 now at 22, blood pressure 137/58 now previously was 156/102.  Laboratory data significant for an elevated white blood cell count of 22,000 Hemoglobin of 7.5/hematocrit of 23.7 Platelet 162 He was heme-negative from below Sodium 130, potassium 3.2, chloride 94, BUN 9, creatinine 0.50, glucose 133, magnesium low at 1.5. Patient's AST is elevated at 154, his ALT is elevated at 74, and his total bili is 1.5 Lactate of 1.0  The patient told the ER doctor that he quit smoking and drinking a week ago which raises suspicions for heavy alcohol use given elevated LFTs and right-sided  pneumonia (with possible empyema and bronchopleural fistula showing loculation) suggestive of gas-forming organisms which are often found in the mouth.  That is I am concerned that the patient aspirated when he was drunk.  I have requested a stepdown bed as I am fairly sure that the patient will deteriorate further during the next 24-48 hours with a low hemoglobin, tachycardia, hypoxemia with now worsening respiratory rate and with a fever.  He also has signs of sepsis given white blood cell count, liver function abnormalities, tachycardia, tachypnea, and a source of infection being pneumonia.  I have requested that the physician Dr. Jones SkeneJohn Guha at the [place] [facility] [facility] continue to all facilities to find an appropriate bed for this patient as we have a long waiting list.

## 2017-09-18 NOTE — Consult Note (Signed)
[facility] Initial Pulmonary/Critical Care Consultation  Patient Name: Craig Oneal. MRN: [id number] DOB: 05-03-1951    ADMISSION DATE:  09/18/2017 CONSULTATION DATE:  09/18/2017  REFERRING MD:  Dr. Randa Spike  REASON FOR CONSULTATION:  Chronic respiratory failure   HISTORY OF PRESENT ILLNESS  This 67 y.o. African American  male is seen in consultation at the request of Dr. Randa Spike for recommendations on further evaluation and management of chronic respiratory failure. He was transferred from [facility] with complaints of blood-tinged urine in Foley catheter noticed this morning. He is on Coumadin for atrial fibrillation, which has already been stopped with additional administration of vitamin K 5 mg.  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 He has chronic respiratory failure and requires nocturnal ventilator use. He underwent tracheostomy and PEG placement in 07/2016. He has a chronic indwelling Foley.  Trach aspirate isolated S marcescens, M catarrhalis (beta lactamase-positive) and carbapenem resistant-P aeruginosa. Pseudomonas is sensitive to Cipro but not meropenem. Has already been started on cefepime/vancomycin. Meropenem has been ordered.  REVIEW OF SYSTEMS Constitutional: No weight loss. No night sweats. No fever. No chills. No fatigue. HEENT: No headaches, dysphagia, sore throat, otalgia, nasal congestion, PND CV:  No chest pain, orthopnea, PND, swelling in lower extremities, palpitations GI:  Abdominal pain. Denies nausea, vomiting, diarrhea, change in bowel pattern, anorexia Resp: No DOE, rest dyspnea, cough, mucus, hemoptysis, wheezing  GU: no dysuria, change in color of urine, no urgency or frequency.  No flank pain. MS:  No joint pain or swelling. No myalgias,  No decreased range of motion.  Psych:  No change in mood or affect. No memory loss. Skin: no rash or  lesions.   PAST MEDICAL/SURGICAL/SOCIAL/FAMILY HISTORIES   Past Medical History:  Diagnosis Date  . Cerebellar atrophy   . Chronic indwelling Foley catheter   . Chronic kidney disease (CKD), stage IV (severe) ([place])   . COPD (chronic obstructive pulmonary disease) ([place])   . Paraplegia ([place])   . Paroxysmal atrial fibrillation ([facility])   . PEG (percutaneous endoscopic gastrostomy) status ([place])   . Systolic heart failure ([place])   . Tracheostomy in place [facility])   . Unilateral AKA, right [facility])     Past Surgical History:  Procedure Laterality Date  . IR REPLC GASTRO/COLONIC TUBE PERCUT W/FLUORO  08/03/2017    Social History   Tobacco Use  . Smoking status: Never Smoker  . Smokeless tobacco: Never Used  Substance Use Topics  . Alcohol use: No    Frequency: Never    No family history on file.   No Known Allergies   Prior to Admission medications   Medication Sig Start Date End Date Taking? Authorizing Provider  acetaminophen (TYLENOL) 325 MG tablet Place 650 mg into feeding tube every 6 (six) hours as needed for mild pain (temp greater than than 100.6).     [provider]  Amino Acids-Protein Hydrolys (FEEDING SUPPLEMENT, PRO-STAT SUGAR FREE 64,) LIQD Place 30 mLs into feeding tube 2 (two) times daily. 08/20/17   Allie Bossier, MD  atorvastatin (LIPITOR) 10 MG tablet Place 1 tablet (10 mg total) into feeding tube at bedtime. 08/20/17   Allie Bossier, MD  baclofen 5 MG TABS Place 5 mg into feeding tube 2 (two) times daily. 08/20/17   Allie Bossier, MD  chlorhexidine gluconate, MEDLINE KIT, (PERIDEX) 0.12 % solution 15 mLs by Mouth Rinse route 2 (two) times daily. 08/20/17  Allie Bossier, MD  collagenase (SANTYL) ointment Apply topically daily. Apply Santyl to right buttock wound Q day, then cover with moist 2X2 and foam dressing.  (Change foam dressing Q 3 days or PRN soiling.) 08/21/17   Allie Bossier, MD  Darbepoetin Alfa 25 MCG/ML SOLN Inject 25 mcg as  directed every 28 (twenty-eight) days.     [provider]  diltiazem (CARDIZEM) 10 mg/ml oral suspension Place 3 mLs (30 mg total) into feeding tube every 6 (six) hours. 08/20/17   Allie Bossier, MD  enoxaparin (LOVENOX) 80 MG/0.8ML injection Inject 0.75 mLs (75 mg total) into the skin every 12 (twelve) hours. 08/20/17   Allie Bossier, MD  fentaNYL (SUBLIMAZE) 100 MCG/2ML injection Inject 0.5-1 mLs (25-50 mcg total) into the vein every 2 (two) hours as needed for severe pain. 08/20/17   Allie Bossier, MD  fluticasone (FLONASE) 50 MCG/ACT nasal spray Place 2 sprays into both nostrils daily.     [provider]  glycopyrrolate (ROBINUL) 1 MG tablet Place 1 tablet (1 mg total) into feeding tube 2 (two) times daily. 08/20/17   Allie Bossier, MD  ipratropium-albuterol (DUONEB) 0.5-2.5 (3) MG/3ML SOLN Take 3 mLs by nebulization 3 (three) times daily. 08/20/17   Allie Bossier, MD  levothyroxine (SYNTHROID, LEVOTHROID) 125 MCG tablet Place 1 tablet (125 mcg total) into feeding tube daily before breakfast. 08/21/17   Allie Bossier, MD  linezolid (ZYVOX) 600 MG/300ML IVPB Inject 300 mLs (600 mg total) into the vein every 12 (twelve) hours. 08/20/17   Allie Bossier, MD  loratadine (CLARITIN) 10 MG tablet Place 10 mg into feeding tube daily.     [provider]  metoprolol tartrate (LOPRESSOR) 25 MG tablet Place 0.5 tablets (12.5 mg total) into feeding tube 2 (two) times daily. 01/25/17   Chesley Mires, MD  montelukast (SINGULAIR) 10 MG tablet Place 10 mg into feeding tube at bedtime.     [provider]  Multiple Vitamin (MULTIVITAMIN WITH MINERALS) TABS tablet Place 1 tablet into feeding tube daily.     [provider]  Nutritional Supplements (FEEDING SUPPLEMENT, VITAL AF 1.2 CAL,) LIQD Place 1,000 mLs into feeding tube continuous. 08/20/17   Allie Bossier, MD  pantoprazole sodium (PROTONIX) 40 mg/20 mL PACK Place 20 mLs (40 mg total) into feeding tube  daily. 08/20/17   Allie Bossier, MD  simethicone (MYLICON) 40 VQ/0.0QQ SUSP Place 0.6 mLs (40 mg total) into feeding tube every 6 (six) hours as needed for flatulence. 08/20/17   Allie Bossier, MD  Vitamin D, Ergocalciferol, (DRISDOL) 50000 units CAPS capsule Place 50,000 Units into feeding tube every Friday.     [provider]  warfarin (COUMADIN) 10 MG tablet Take 1 tablet (10 mg total) by mouth daily at 6 PM. 08/21/17   Allie Bossier, MD  Water For Irrigation, Sterile (FREE WATER) SOLN Place 200 mLs into feeding tube every 8 (eight) hours. 08/20/17   Allie Bossier, MD    Current Facility-Administered Medications  Medication Dose Route Frequency Provider Last Rate Last Dose  . furosemide (LASIX) injection 40 mg  40 mg Intravenous Once Rancour, Stephen, MD      . meropenem (MERREM) 1 g in sodium chloride 0.9 % 100 mL IVPB  1 g Intravenous Q8H Ivor Costa, MD       Current Outpatient Medications  Medication Sig Dispense Refill  . acetaminophen (TYLENOL) 325 MG tablet Place 650 mg into feeding tube  every 6 (six) hours as needed for mild pain (temp greater than than 100.6).     Marland Kitchen Amino Acids-Protein Hydrolys (FEEDING SUPPLEMENT, PRO-STAT SUGAR FREE 64,) LIQD Place 30 mLs into feeding tube 2 (two) times daily. 900 mL 0  . atorvastatin (LIPITOR) 10 MG tablet Place 1 tablet (10 mg total) into feeding tube at bedtime. 30 tablet 0  . baclofen 5 MG TABS Place 5 mg into feeding tube 2 (two) times daily. 10 each 0  . chlorhexidine gluconate, MEDLINE KIT, (PERIDEX) 0.12 % solution 15 mLs by Mouth Rinse route 2 (two) times daily. 120 mL 0  . collagenase (SANTYL) ointment Apply topically daily. Apply Santyl to right buttock wound Q day, then cover with moist 2X2 and foam dressing.  (Change foam dressing Q 3 days or PRN soiling.) 15 g 0  . Darbepoetin Alfa 25 MCG/ML SOLN Inject 25 mcg as directed every 28 (twenty-eight) days.     Marland Kitchen diltiazem (CARDIZEM) 10 mg/ml oral suspension Place 3 mLs (30  mg total) into feeding tube every 6 (six) hours. 250 mL 0  . enoxaparin (LOVENOX) 80 MG/0.8ML injection Inject 0.75 mLs (75 mg total) into the skin every 12 (twelve) hours. 48 Syringe 0  . fentaNYL (SUBLIMAZE) 100 MCG/2ML injection Inject 0.5-1 mLs (25-50 mcg total) into the vein every 2 (two) hours as needed for severe pain. 8 mL 0  . fluticasone (FLONASE) 50 MCG/ACT nasal spray Place 2 sprays into both nostrils daily.     Marland Kitchen glycopyrrolate (ROBINUL) 1 MG tablet Place 1 tablet (1 mg total) into feeding tube 2 (two) times daily. 30 tablet 0  . ipratropium-albuterol (DUONEB) 0.5-2.5 (3) MG/3ML SOLN Take 3 mLs by nebulization 3 (three) times daily. 360 mL 0  . levothyroxine (SYNTHROID, LEVOTHROID) 125 MCG tablet Place 1 tablet (125 mcg total) into feeding tube daily before breakfast. 30 tablet 0  . linezolid (ZYVOX) 600 MG/300ML IVPB Inject 300 mLs (600 mg total) into the vein every 12 (twelve) hours. 3600 mL 0  . loratadine (CLARITIN) 10 MG tablet Place 10 mg into feeding tube daily.     . metoprolol tartrate (LOPRESSOR) 25 MG tablet Place 0.5 tablets (12.5 mg total) into feeding tube 2 (two) times daily.    . montelukast (SINGULAIR) 10 MG tablet Place 10 mg into feeding tube at bedtime.     . Multiple Vitamin (MULTIVITAMIN WITH MINERALS) TABS tablet Place 1 tablet into feeding tube daily.     . Nutritional Supplements (FEEDING SUPPLEMENT, VITAL AF 1.2 CAL,) LIQD Place 1,000 mLs into feeding tube continuous. 1500 mL 0  . pantoprazole sodium (PROTONIX) 40 mg/20 mL PACK Place 20 mLs (40 mg total) into feeding tube daily. 30 each 0  . simethicone (MYLICON) 40 ZS/8.2LM SUSP Place 0.6 mLs (40 mg total) into feeding tube every 6 (six) hours as needed for flatulence. 15 mL 0  . Vitamin D, Ergocalciferol, (DRISDOL) 50000 units CAPS capsule Place 50,000 Units into feeding tube every Friday.     . warfarin (COUMADIN) 10 MG tablet Take 1 tablet (10 mg total) by mouth daily at 6 PM. 30 tablet 0  . Water For  Irrigation, Sterile (FREE WATER) SOLN Place 200 mLs into feeding tube every 8 (eight) hours. 1000 mL 0     VITAL SIGNS: BP (!) 147/130   Pulse (!) 112   Temp (!) 96.7 F (35.9 C) (Rectal)   Resp (!) 24   Wt 77 kg (169 lb 12.1 oz)   SpO2 98%  BMI 23.68 kg/m   HEMODYNAMICS:    VENTILATOR SETTINGS: Vent Mode: PRVC FiO2 (%):  [40 %] 40 % Set Rate:  [16 bmp] 16 bmp Vt Set:  [500 mL] 500 mL PEEP:  [5 cmH20] 5 cmH20 Plateau Pressure:  [16 cmH20-27 cmH20] 27 cmH20  INTAKE / OUTPUT: I/O last 3 completed shifts: In: 103 [IV Piggyback:50] Out: -   PHYSICAL EXAMINATION: General:  Pleasant. Well-developed. Alert, awake and oriented to time, person and place. Cooperative. No acute distress. Normal affect. Head: normocephalic, atraumatic EYE: PERRLA, EOM intact, no scleral icterus, no pallor Nose: nares are patent. No polyps. No exudate. No sinus tenderness. Throat/Oral Cavity: Normal dentition. No oral thrush. No exudate. Mucous membranes are moist. No tonsillar enlargement. Neck: tracheostomy (Portex 8 mm). supple, no JVD, no lymphadenopathy. Trachea midline. Chest/Lung: symmetric in development and expansion. Good air entry. Coarse breath sounds with rales and rhonchi. Heart: Irregularly irregular rate and rhythm. 2/6 systolic murmur in the mitral area. No rub. No gallop.  Abdomen: soft, nontender, nondistended. Normoactive bowel sounds. n rebound. No guarding. Extremities: R AKA Lymphatic: no cervical/axiallary/inguinal lymph nodes appreciated Skin:  No rash or lesion.   LABS:  BMET Recent Labs  Lab 09/18/17 0347  NA 139  K 4.5  CL 100*  CO2 28  BUN 48*  CREATININE 0.75  GLUCOSE 91    Electrolytes Recent Labs  Lab 09/18/17 0347  CALCIUM 9.2    CBC Recent Labs  Lab 09/18/17 0347  WBC 11.4*  HGB 7.7*  HCT 26.8*  PLT 262    Coag's Recent Labs  Lab 09/18/17 0347  INR 2.00    Sepsis Markers Recent Labs  Lab 09/18/17 0414 09/18/17 0616   LATICACIDVEN 0.62 0.7    ABG Recent Labs  Lab 09/18/17 0415  PHART 7.361  PCO2ART 55.9*  PO2ART 65.0*    Liver Enzymes Recent Labs  Lab 09/18/17 0347  AST 197*  ALT 382*  ALKPHOS 1,094*  BILITOT 2.6*  ALBUMIN 2.2*    Cardiac Enzymes No results for input(s): TROPONINI, PROBNP in the last 168 hours.  Glucose No results for input(s): GLUCAP in the last 168 hours.  Imaging Ct Abdomen Pelvis W Contrast  Result Date: 09/18/2017 CLINICAL DATA:  67 year old male with hematuria and abnormal LFT. EXAM: CT ABDOMEN AND PELVIS WITH CONTRAST TECHNIQUE: Multidetector CT imaging of the abdomen and pelvis was performed using the standard protocol following bolus administration of intravenous contrast. CONTRAST:  143m ISOVUE-300 IOPAMIDOL (ISOVUE-300) INJECTION 61% COMPARISON:  Abdominal radiograph dated 09/18/2017 and CT of the abdomen pelvis dated 08/04/2017 and 08/02/2017. FINDINGS: Evaluation is limited due to streak artifact caused by patient's arms. Lower chest: Partially visualized small bilateral pleural effusions. There is cardiomegaly with coronary vascular calcification. Partially visualized tip of the catheter in the right atrium. There is diffuse interstitial prominence consistent with CHF. Bibasilar subsegmental atelectasis versus infiltrate noted. No intra-abdominal free air. Small free fluid adjacent to the liver. Hepatobiliary: Minimal irregularity of the hepatic contour concerning for early changes of cirrhosis. Clinical correlation is recommended. A 12 mm subcapsular hypodense focus in the right lobe of the liver (series 3, image 30) is not characterized but may represent an area of scarring. This is similar to the CT of 08/02/2017. there is no intrahepatic biliary ductal dilatation. Layering sludge or small stones noted in the gallbladder. No pericholecystic fluid or evidence of acute cholecystitis by CT. Pancreas: Unremarkable. No pancreatic ductal dilatation or surrounding  inflammatory changes. Spleen: Normal in size without focal abnormality.  Adrenals/Urinary Tract: The adrenal glands are unremarkable. Mild bilateral hydronephrosis. There is interval improvement of the right-sided hydronephrosis compared to the prior CT. There is a 1 cm left renal cyst. Subcentimeter left renal hypodense lesion is not well characterized. There is mild enhancement of the urothelium. Correlation with urinalysis recommended to exclude UTI. No stone identified. The urinary bladder is unremarkable. A Foley catheter is seen with balloon in the prostatic urethra and tip just entering the bladder. Recommend further advancing of the Foley into the bladder. Stomach/Bowel: A percutaneous gastrostomy is noted with balloon and tip in the body of the stomach. Moderate amount of stool noted throughout the colon. There is no bowel obstruction or active inflammation. Vascular/Lymphatic: There is moderate aortoiliac atherosclerotic disease. 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 There is reconstitution of the flow in the distal right external iliac artery. The origins of the celiac axis, SMA remain patent. No portal venous gas. There is no adenopathy. Mildly rounded lymph node along the right iliac chain measures 9 mm in diameter similar to prior CT. Reproductive: Mildly enlarged prostate gland measures 5.5 cm in diameter. Other: Right ischial deep decubitus ulcer extends close to the ischial tuberosity. No evidence of osteomyelitis by CT. No drainable fluid collection or abscess. Musculoskeletal: No acute osseous pathology. IMPRESSION: 1. Cardiomegaly with findings of CHF, small bilateral pleural effusions, and bibasilar atelectasis versus infiltrate. Clinical correlation is recommended. 2. Mild bilateral hydronephrosis. Correlation with urinalysis recommended to exclude UTI. 3. Foley catheter with balloon in the prostatic urethra. Recommend  further advancing the catheter into the bladder. 4. No bowel obstruction or active inflammation. Percutaneous gastrostomy within the stomach. 5. Slight irregularity of the hepatic contour concerning for early changes of cirrhosis. Clinical correlation is recommended. 6. Layering sludge or small stones. 7. Advanced Aortic Atherosclerosis (ICD10-I70.0). 8. 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 This is of indeterminate chronicity but likely chronic. Clinical correlation is recommended. 9. Right ischial decubitus ulcer. No fluid collection or abscess. No evidence of osteomyelitis by CT. Clinical correlation is recommended. Electronically Signed   By: Anner Crete M.D.   On: 09/18/2017 06:19   Dg Chest Portable 1 View  Result Date: 09/18/2017 CLINICAL DATA:  Shortness of breath, on treatment for urinary tract infection. EXAM: PORTABLE CHEST 1 VIEW COMPARISON:  Chest radiograph August 15, 2017 FINDINGS: The cardiac silhouette is mildly enlarged and unchanged. Increasing interstitial prominence with retrocardiac consolidation. Small pleural effusions. No pneumothorax. Tracheostomy tube tip projects 5.3 cm above the carina. RIGHT PICC distal tip projecting cavoatrial junction. No pneumothorax. Soft tissue planes and included osseous structures are nonacute. Osteopenia. IMPRESSION: Cardiomegaly. Increasing interstitial prominence seen with atypical infection or pulmonary edema with retrocardiac consolidation. Small pleural effusions. RIGHT PICC distal tip projects at cavoatrial junction. Tracheostomy tube in situ. Electronically Signed   By: Elon Alas M.D.   On: 09/18/2017 04:08   Dg Abd Portable 2 Views  Result Date: 09/18/2017 CLINICAL DATA:  Shortness of breath, on treatment for urinary tract infection. No bowel movement for 4 days. EXAM: PORTABLE ABDOMEN - 2 VIEW COMPARISON:  Abdominal radiograph  August 17, 2017 FINDINGS: Gastrostomy tube projects in stomach. Bowel gas pattern is nondilated and nonobstructive. Large body habitus. Phleboliths project in the pelvis. No intra-abdominal mass effect. Limited assessment for free air due to habitus. Soft tissue planes and included osseous structures  are unchanged. IMPRESSION: Nonspecific bowel gas pattern.  Gastrostomy tube in situ. Electronically Signed   By: Elon Alas M.D.   On: 09/18/2017 04:10   US Abdomen Limited Ruq  Result Date: 09/18/2017 CLINICAL DATA:  Elevated LFTs EXAM: ULTRASOUND ABDOMEN LIMITED RIGHT UPPER QUADRANT COMPARISON:  CT 09/18/2017 FINDINGS: Gallbladder: Small layering gallstones and sludge within the gallbladder. Small amount of pericholecystic fluid. Normal wall thickness. Negative sonographic Murphy's. Common bile duct: Diameter: Normal caliber, 5-6 mm. Liver: Slightly nodular contours of the liver suggests the possibility of early cirrhosis. No focal hepatic abnormality. Portal vein is patent on color Doppler imaging with normal direction of blood flow towards the liver. IMPRESSION: Layering gall stones and sludge within the gallbladder. There is a small amount of pericholecystic fluid. There is no wall thickening or sonographic Murphy's sign. This fluid could be related to liver disease. Mildly nodular contours of the liver suggests cirrhosis. Electronically Signed   By: Rolm Baptise M.D.   On: 09/18/2017 07:42     STUDIES:   CULTURES: Results for orders placed or performed during the hospital encounter of 08/01/17  Blood Culture (routine x 2)     Status: Abnormal   Collection Time: 08/01/17  5:22 PM  Result Value Ref Range Status   Specimen Description BLOOD LEFT HAND  Final   Special Requests   Final    Blood Culture adequate volume BOTTLES DRAWN AEROBIC AND ANAEROBIC   Culture  Setup Time   Final    GRAM POSITIVE COCCI IN CLUSTERS ANAEROBIC BOTTLE ONLY CRITICAL RESULT CALLED TO, READ BACK BY AND  VERIFIED WITH: PHARMD G ABBOTT [id number] MLM YEAST AEROBIC BOTTLE ONLY CRITICAL RESULT CALLED TO, READ BACK BY AND VERIFIED WITH: K WEIGLE PHARMD 0330 08/05/17 A BROWNING    Culture (A)  Final    STAPHYLOCOCCUS SPECIES (COAGULASE NEGATIVE) THE SIGNIFICANCE OF ISOLATING THIS ORGANISM FROM A SINGLE SET OF BLOOD CULTURES WHEN MULTIPLE SETS ARE DRAWN IS UNCERTAIN. PLEASE NOTIFY THE MICROBIOLOGY DEPARTMENT WITHIN ONE WEEK IF SPECIATION AND SENSITIVITIES ARE REQUIRED. CANDIDA GLABRATA CORRECTED ON 12/01 AT 0840: PREVIOUSLY REPORTED AS YEAST    Report Status 08/07/2017 FINAL  Final  Blood Culture ID Panel (Reflexed)     Status: Abnormal   Collection Time: 08/01/17  5:22 PM  Result Value Ref Range Status   Enterococcus species NOT DETECTED NOT DETECTED Final   Listeria monocytogenes NOT DETECTED NOT DETECTED Final   Staphylococcus species DETECTED (A) NOT DETECTED Final    Comment: Methicillin (oxacillin) resistant coagulase negative staphylococcus. Possible blood culture contaminant (unless isolated from more than one blood culture draw or clinical case suggests pathogenicity). No antibiotic treatment is indicated for blood  culture contaminants. CRITICAL RESULT CALLED TO, READ BACK BY AND VERIFIED WITH: PHARMD G ABBOTT [id number] MLM    Staphylococcus aureus NOT DETECTED NOT DETECTED Final   Methicillin resistance DETECTED (A) NOT DETECTED Final    Comment: CRITICAL RESULT CALLED TO, READ BACK BY AND VERIFIED WITH: PHARMD G ABBOTT [id number] MLM    Streptococcus species NOT DETECTED NOT DETECTED Final   Streptococcus agalactiae NOT DETECTED NOT DETECTED Final   Streptococcus pneumoniae NOT DETECTED NOT DETECTED Final   Streptococcus pyogenes NOT DETECTED NOT DETECTED Final   Acinetobacter baumannii NOT DETECTED NOT DETECTED Final   Enterobacteriaceae species NOT DETECTED NOT DETECTED Final   Enterobacter cloacae complex NOT DETECTED NOT DETECTED Final   Escherichia coli NOT DETECTED  NOT DETECTED Final   Klebsiella oxytoca NOT DETECTED NOT DETECTED  Final   Klebsiella pneumoniae NOT DETECTED NOT DETECTED Final   Proteus species NOT DETECTED NOT DETECTED Final   Serratia marcescens NOT DETECTED NOT DETECTED Final   Haemophilus influenzae NOT DETECTED NOT DETECTED Final   Neisseria meningitidis NOT DETECTED NOT DETECTED Final   Pseudomonas aeruginosa NOT DETECTED NOT DETECTED Final   Candida albicans NOT DETECTED NOT DETECTED Final   Candida glabrata NOT DETECTED NOT DETECTED Final   Candida krusei NOT DETECTED NOT DETECTED Final   Candida parapsilosis NOT DETECTED NOT DETECTED Final   Candida tropicalis NOT DETECTED NOT DETECTED Final  Blood Culture ID Panel (Reflexed)     Status: Abnormal   Collection Time: 08/01/17  5:22 PM  Result Value Ref Range Status   Enterococcus species NOT DETECTED NOT DETECTED Final   Listeria monocytogenes NOT DETECTED NOT DETECTED Final   Staphylococcus species NOT DETECTED NOT DETECTED Final   Staphylococcus aureus NOT DETECTED NOT DETECTED Final   Streptococcus species NOT DETECTED NOT DETECTED Final   Streptococcus agalactiae NOT DETECTED NOT DETECTED Final   Streptococcus pneumoniae NOT DETECTED NOT DETECTED Final   Streptococcus pyogenes NOT DETECTED NOT DETECTED Final   Acinetobacter baumannii NOT DETECTED NOT DETECTED Final   Enterobacteriaceae species NOT DETECTED NOT DETECTED Final   Enterobacter cloacae complex NOT DETECTED NOT DETECTED Final   Escherichia coli NOT DETECTED NOT DETECTED Final   Klebsiella oxytoca NOT DETECTED NOT DETECTED Final   Klebsiella pneumoniae NOT DETECTED NOT DETECTED Final   Proteus species NOT DETECTED NOT DETECTED Final   Serratia marcescens NOT DETECTED NOT DETECTED Final   Haemophilus influenzae NOT DETECTED NOT DETECTED Final   Neisseria meningitidis NOT DETECTED NOT DETECTED Final   Pseudomonas aeruginosa NOT DETECTED NOT DETECTED Final   Candida albicans NOT DETECTED NOT DETECTED Final    Candida glabrata DETECTED (A) NOT DETECTED Final    Comment: CRITICAL RESULT CALLED TO, READ BACK BY AND VERIFIED WITH: K WEIGLE PHARMD 0330 08/05/17 A BROWNING    Candida krusei NOT DETECTED NOT DETECTED Final   Candida parapsilosis NOT DETECTED NOT DETECTED Final   Candida tropicalis NOT DETECTED NOT DETECTED Final  Blood Culture (routine x 2)     Status: None   Collection Time: 08/01/17  7:05 PM  Result Value Ref Range Status   Specimen Description BLOOD RIGHT PICC LINE  Final   Special Requests IN PEDIATRIC BOTTLE Blood Culture adequate volume  Final   Culture NO GROWTH 5 DAYS  Final   Report Status 08/06/2017 FINAL  Final  Culture, respiratory (tracheal aspirate)     Status: None   Collection Time: 08/01/17 11:56 PM  Result Value Ref Range Status   Specimen Description TRACHEAL ASPIRATE  Final   Special Requests NONE  Final   Gram Stain   Final    ABUNDANT WBC PRESENT,BOTH PMN AND MONONUCLEAR MODERATE GRAM NEGATIVE RODS RARE GRAM POSITIVE RODS    Culture FEW PSEUDOMONAS AERUGINOSA  Final   Report Status 08/06/2017 FINAL  Final   Organism ID, Bacteria PSEUDOMONAS AERUGINOSA  Final      Susceptibility   Pseudomonas aeruginosa - MIC*    CEFTAZIDIME 4 SENSITIVE Sensitive     CIPROFLOXACIN <=0.25 SENSITIVE Sensitive     GENTAMICIN 8 INTERMEDIATE Intermediate     IMIPENEM >=16 RESISTANT Resistant     PIP/TAZO 16 SENSITIVE Sensitive     CEFEPIME 16 INTERMEDIATE Intermediate     * FEW PSEUDOMONAS AERUGINOSA  Urine culture     Status: Abnormal   Collection  Time: 08/01/17 11:56 PM  Result Value Ref Range Status   Specimen Description URINE, CATHETERIZED  Final   Special Requests NONE  Final   Culture 30,000 COLONIES/mL YEAST (A)  Final   Report Status 08/03/2017 FINAL  Final  MRSA PCR Screening     Status: None   Collection Time: 08/02/17  1:05 AM  Result Value Ref Range Status   MRSA by PCR NEGATIVE NEGATIVE Final    Comment:        The GeneXpert MRSA Assay (FDA approved  for NASAL specimens only), is one component of a comprehensive MRSA colonization surveillance program. It is not intended to diagnose MRSA infection nor to guide or monitor treatment for MRSA infections.   Culture, blood (Routine X 2) w Reflex to ID Panel     Status: None   Collection Time: 08/03/17  3:16 PM  Result Value Ref Range Status   Specimen Description BLOOD LEFT HAND  Final   Special Requests IN PEDIATRIC BOTTLE Blood Culture adequate volume  Final   Culture NO GROWTH 5 DAYS  Final   Report Status 08/08/2017 FINAL  Final  Culture, blood (Routine X 2) w Reflex to ID Panel     Status: Abnormal   Collection Time: 08/03/17  3:28 PM  Result Value Ref Range Status   Specimen Description BLOOD LEFT HAND  Final   Special Requests IN PEDIATRIC BOTTLE Blood Culture adequate volume  Final   Culture  Setup Time   Final    YEAST IN PEDIATRIC BOTTLE CRITICAL VALUE NOTED.  VALUE IS CONSISTENT WITH PREVIOUSLY REPORTED AND CALLED VALUE.    Culture CANDIDA ALBICANS (A)  Final   Report Status 08/07/2017 FINAL  Final  Culture, blood (Routine X 2) w Reflex to ID Panel     Status: None   Collection Time: 08/06/17 11:40 AM  Result Value Ref Range Status   Specimen Description BLOOD LEFT ANTECUBITAL  Final   Special Requests   Final    BOTTLES DRAWN AEROBIC AND ANAEROBIC Blood Culture results may not be optimal due to an excessive volume of blood received in culture bottles   Culture NO GROWTH 5 DAYS  Final   Report Status 08/11/2017 FINAL  Final  Culture, blood (Routine X 2) w Reflex to ID Panel     Status: None   Collection Time: 08/06/17 11:41 AM  Result Value Ref Range Status   Specimen Description BLOOD RIGHT HAND  Final   Special Requests   Final    BOTTLES DRAWN AEROBIC AND ANAEROBIC Blood Culture adequate volume   Culture NO GROWTH 5 DAYS  Final   Report Status 08/11/2017 FINAL  Final  Culture, blood (routine x 2)     Status: None   Collection Time: 08/16/17 12:58 AM  Result  Value Ref Range Status   Specimen Description BLOOD RIGHT HAND  Final   Special Requests   Final    BOTTLES DRAWN AEROBIC AND ANAEROBIC Blood Culture adequate volume   Culture NO GROWTH 5 DAYS  Final   Report Status 08/21/2017 FINAL  Final  Culture, blood (routine x 2)     Status: Abnormal   Collection Time: 08/16/17  1:15 AM  Result Value Ref Range Status   Specimen Description BLOOD LEFT HAND  Final   Special Requests   Final    BOTTLES DRAWN AEROBIC ONLY Blood Culture adequate volume   Culture  Setup Time   Final    GRAM POSITIVE COCCI AEROBIC BOTTLE ONLY CRITICAL  RESULT CALLED TO, READ BACK BY AND VERIFIED WITH: L. POWELL, RPHARMD AT 1620 ON 08/17/17 BY C. JESSUP, MLT.    Culture (A)  Final    STAPHYLOCOCCUS SPECIES (COAGULASE NEGATIVE) THE SIGNIFICANCE OF ISOLATING THIS ORGANISM FROM A SINGLE SET OF BLOOD CULTURES WHEN MULTIPLE SETS ARE DRAWN IS UNCERTAIN. PLEASE NOTIFY THE MICROBIOLOGY DEPARTMENT WITHIN ONE WEEK IF SPECIATION AND SENSITIVITIES ARE REQUIRED.    Report Status 08/18/2017 FINAL  Final  Blood Culture ID Panel (Reflexed)     Status: Abnormal   Collection Time: 08/16/17  1:15 AM  Result Value Ref Range Status   Enterococcus species NOT DETECTED NOT DETECTED Final   Listeria monocytogenes NOT DETECTED NOT DETECTED Final   Staphylococcus species DETECTED (A) NOT DETECTED Final    Comment: Methicillin (oxacillin) resistant coagulase negative staphylococcus. Possible blood culture contaminant (unless isolated from more than one blood culture draw or clinical case suggests pathogenicity). No antibiotic treatment is indicated for blood  culture contaminants. CRITICAL RESULT CALLED TO, READ BACK BY AND VERIFIED WITH: L. POWELL, RPHARMD AT 1920 ON 08/17/17 BY C. JESSUP, MLT.    Staphylococcus aureus NOT DETECTED NOT DETECTED Final   Methicillin resistance DETECTED (A) NOT DETECTED Final    Comment: CRITICAL RESULT CALLED TO, READ BACK BY AND VERIFIED WITH: L. POWELL,  RPHARMD AT 1920 ON 08/17/17 BY C. JESSUP, MLT.    Streptococcus species NOT DETECTED NOT DETECTED Final   Streptococcus agalactiae NOT DETECTED NOT DETECTED Final   Streptococcus pneumoniae NOT DETECTED NOT DETECTED Final   Streptococcus pyogenes NOT DETECTED NOT DETECTED Final   Acinetobacter baumannii NOT DETECTED NOT DETECTED Final   Enterobacteriaceae species NOT DETECTED NOT DETECTED Final   Enterobacter cloacae complex NOT DETECTED NOT DETECTED Final   Escherichia coli NOT DETECTED NOT DETECTED Final   Klebsiella oxytoca NOT DETECTED NOT DETECTED Final   Klebsiella pneumoniae NOT DETECTED NOT DETECTED Final   Proteus species NOT DETECTED NOT DETECTED Final   Serratia marcescens NOT DETECTED NOT DETECTED Final   Haemophilus influenzae NOT DETECTED NOT DETECTED Final   Neisseria meningitidis NOT DETECTED NOT DETECTED Final   Pseudomonas aeruginosa NOT DETECTED NOT DETECTED Final   Candida albicans NOT DETECTED NOT DETECTED Final   Candida glabrata NOT DETECTED NOT DETECTED Final   Candida krusei NOT DETECTED NOT DETECTED Final   Candida parapsilosis NOT DETECTED NOT DETECTED Final   Candida tropicalis NOT DETECTED NOT DETECTED Final  Culture, Urine     Status: Abnormal   Collection Time: 08/16/17  3:50 PM  Result Value Ref Range Status   Specimen Description URINE, CATHETERIZED  Final   Special Requests NONE  Final   Culture (A)  Final    >=100,000 COLONIES/mL VANCOMYCIN RESISTANT ENTEROCOCCUS ISOLATED   Report Status 08/18/2017 FINAL  Final   Organism ID, Bacteria VANCOMYCIN RESISTANT ENTEROCOCCUS ISOLATED (A)  Final      Susceptibility   Vancomycin resistant enterococcus isolated - MIC*    AMPICILLIN >=32 RESISTANT Resistant     LEVOFLOXACIN >=8 RESISTANT Resistant     NITROFURANTOIN 128 RESISTANT Resistant     VANCOMYCIN >=32 RESISTANT Resistant     LINEZOLID 2 SENSITIVE Sensitive     * >=100,000 COLONIES/mL VANCOMYCIN RESISTANT ENTEROCOCCUS ISOLATED  Culture,  respiratory (NON-Expectorated)     Status: None   Collection Time: 08/16/17 11:30 PM  Result Value Ref Range Status   Specimen Description TRACHEAL ASPIRATE  Final   Special Requests Normal  Final   Gram Stain   Final  MODERATE WBC PRESENT,BOTH PMN AND MONONUCLEAR ABUNDANT GRAM NEGATIVE RODS MODERATE GRAM POSITIVE RODS RARE GRAM POSITIVE COCCI    Culture   Final    MODERATE PSEUDOMONAS AERUGINOSA MODERATE SERRATIA MARCESCENS    Report Status 08/21/2017 FINAL  Final   Organism ID, Bacteria PSEUDOMONAS AERUGINOSA  Final   Organism ID, Bacteria SERRATIA MARCESCENS  Final      Susceptibility   Pseudomonas aeruginosa - MIC*    CEFTAZIDIME 16 INTERMEDIATE Intermediate     CIPROFLOXACIN 1 SENSITIVE Sensitive     GENTAMICIN <=1 SENSITIVE Sensitive     IMIPENEM >=16 RESISTANT Resistant     CEFEPIME 16 INTERMEDIATE Intermediate     * MODERATE PSEUDOMONAS AERUGINOSA   Serratia marcescens - MIC*    CEFAZOLIN >=64 RESISTANT Resistant     CEFEPIME <=1 SENSITIVE Sensitive     CEFTAZIDIME <=1 SENSITIVE Sensitive     CEFTRIAXONE <=1 SENSITIVE Sensitive     CIPROFLOXACIN <=0.25 SENSITIVE Sensitive     GENTAMICIN 4 SENSITIVE Sensitive     TRIMETH/SULFA <=20 SENSITIVE Sensitive     * MODERATE SERRATIA MARCESCENS    ANTIBIOTICS: Cefepime 1/13 Meropenem 1/13  SIGNIFICANT EVENTS:    ASSESSMENT / PLAN: Principal Problem:   Sepsis ([place]) Active Problems:   Decubitus ulcer of right ischial area, stage IV (HCC)   Acute on chronic respiratory failure with hypoxemia (HCC)   Bilateral hydronephrosis   E-coli UTI   Pneumonia due to Serratia marcescens (HCC)   Moraxella catarrhalis pneumonia (HCC)   Atrial fibrillation with rapid ventricular response (HCC)   Chronic indwelling Foley catheter   Acute posthemorrhagic anemia   Chronic bilateral pleural effusions   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 May need to use one beta-lactam and add quinolone or other effective anti-Pseudomonal coverage (carbapenem-resistant). ID consultation PENDING. Check ABG Titrate vent settings for ABG results   Renee Pain, MD Board Certified by the ABIM, [place] Pager: [phone]  09/18/2017, 9:07 AM

## 2017-09-18 NOTE — ED Triage Notes (Signed)
Pt BIB [facility] from [facility]. Pt is currently being treated for a UTI. Noticed hematuria today in foley. D/c warfarin and gave 5MG  of vitamin K. Red tinge noted to patients urine. Staff also reports patient has not had a BM in 2-3 days. PICC line in place from facility

## 2017-09-18 NOTE — Progress Notes (Signed)
Pt transported to and from ultrasound on ventilator by RT with no problems. VS stable.

## 2017-09-18 NOTE — ED Provider Notes (Signed)
[place] EMERGENCY DEPARTMENT Provider Note   CSN: [id number] Arrival date & time: 09/18/17  0325     History   Chief Complaint Chief Complaint  Patient presents with  . Hematuria    HPI Craig Oneal. is a 67 y.o. male.  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  Patient is reportedly being treated for UTI with an unknown antibiotic.  He was given 5 mg of vitamin K at [facility] before transfer.  Patient also complaining of abdominal pain has not had a bowel movement in 2 or 3 days.  Patient is vent dependent at night only and is on trach collar during the day.  No report of fever or vomiting.  Feels that his breathing is at baseline.   The history is provided by the patient and the EMS personnel. The history is limited by the condition of the patient.  Hematuria     Past Medical History:  Diagnosis Date  . A-fib ([place])   . Hx of AKA (above knee amputation), right ([place])   . Tracheostomy in place [facility])     Patient Active Problem List   Diagnosis Date Noted  . Severe sepsis ([place])   . VRE (vancomycin-resistant Enterococci) infection   . Acute on chronic respiratory failure with hypoxia ([place])   . Pneumonia of both lower lobes due to Pseudomonas species ([place])   . Hydronephrosis   . Fungal infection   . Carbapenem-resistant Enterobacteriaceae infection   . Candida glabrata infection   . Chronic systolic CHF (congestive heart failure) ([place])   . Pulmonary hypertension ([place])   . Atrial fibrillation, chronic ([place])   . Acute renal failure superimposed on stage 3 chronic kidney disease ([place])   . Hypernatremia   . Hypokalemia   .  Hypomagnesemia   . Chronic indwelling Foley catheter   . Acute blood loss anemia   . S/P AKA (above knee amputation) unilateral, right ([place])   . Penile laceration   . Ventilator dependence ([place])   . Candidemia ([place])   . Tracheostomy status ([place])   . Hypoxemia   . Acute on chronic respiratory failure with hypoxemia ([place])   . Acute respiratory failure ([place])   . 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 Acute encephalopathy 08/01/2017  . Healthcare-associated pneumonia   . Hyperkalemia   . Acute kidney injury ([place])   . Atrial fibrillation with rapid ventricular response ([place])   . Acute pulmonary edema (HCC)   . Pressure injury of skin 01/23/2017  . Septic shock ([place]) 01/22/2017    Past Surgical History:  Procedure Laterality Date  . IR [place] TUBE PERCUT W/FLUORO  08/03/2017       Home Medications    Prior to Admission medications   Medication Sig Start Date End Date Taking? Authorizing Provider  acetaminophen (TYLENOL) 325 MG tablet Place 650 mg into feeding tube every 6 (six) hours as needed for mild pain (temp greater than than 100.6).     [provider]  Amino Acids-Protein Hydrolys (FEEDING SUPPLEMENT, PRO-STAT SUGAR FREE 64,) LIQD Place 30 mLs into feeding tube 2 (two) times daily. 08/20/17   Allie Bossier, MD  atorvastatin (LIPITOR) 10 MG tablet Place 1 tablet (10 mg total) into feeding tube at bedtime. 08/20/17   Allie Bossier, MD  baclofen 5 MG TABS Place 5 mg into feeding tube 2 (two) times daily. 08/20/17   Allie Bossier, MD  chlorhexidine gluconate, MEDLINE KIT, (PERIDEX) 0.12 % solution 15 mLs by Mouth Rinse route 2 (two) times daily. 08/20/17   Allie Bossier, MD  collagenase (SANTYL) ointment Apply topically daily. Apply Santyl to right buttock wound Q day, then cover with moist 2X2 and foam dressing.  (Change foam dressing Q 3 days or PRN soiling.) 08/21/17   Allie Bossier, MD  Darbepoetin Alfa 25 MCG/ML  SOLN Inject 25 mcg as directed every 28 (twenty-eight) days.     [provider]  diltiazem (CARDIZEM) 10 mg/ml oral suspension Place 3 mLs (30 mg total) into feeding tube every 6 (six) hours. 08/20/17   Allie Bossier, MD  enoxaparin (LOVENOX) 80 MG/0.8ML injection Inject 0.75 mLs (75 mg total) into the skin every 12 (twelve) hours. 08/20/17   Allie Bossier, MD  fentaNYL (SUBLIMAZE) 100 MCG/2ML injection Inject 0.5-1 mLs (25-50 mcg total) into the vein every 2 (two) hours as needed for severe pain. 08/20/17   Allie Bossier, MD  fluticasone (FLONASE) 50 MCG/ACT nasal spray Place 2 sprays into both nostrils daily.     [provider]  glycopyrrolate (ROBINUL) 1 MG tablet Place 1 tablet (1 mg total) into feeding tube 2 (two) times daily. 08/20/17   Allie Bossier, MD  ipratropium-albuterol (DUONEB) 0.5-2.5 (3) MG/3ML SOLN Take 3 mLs by nebulization 3 (three) times daily. 08/20/17   Allie Bossier, MD  levothyroxine (SYNTHROID, LEVOTHROID) 125 MCG tablet Place 1 tablet (125 mcg total) into feeding tube daily before breakfast. 08/21/17   Allie Bossier, MD  linezolid (ZYVOX) 600 MG/300ML IVPB Inject 300 mLs (600 mg total) into the vein every 12 (twelve) hours. 08/20/17   Allie Bossier, MD  loratadine (CLARITIN) 10 MG tablet Place 10 mg into feeding tube daily.     [provider]  metoprolol tartrate (LOPRESSOR) 25 MG tablet Place 0.5 tablets (12.5 mg total) into feeding tube 2 (two) times daily. 01/25/17   Chesley Mires, MD  montelukast (SINGULAIR) 10 MG tablet Place 10 mg into feeding tube at bedtime.     [provider]  Multiple Vitamin (MULTIVITAMIN WITH MINERALS) TABS tablet Place 1 tablet into feeding tube daily.     [provider]  Nutritional Supplements (FEEDING SUPPLEMENT, VITAL AF 1.2 CAL,) LIQD Place 1,000 mLs into feeding tube continuous. 08/20/17   Allie Bossier, MD  pantoprazole sodium (PROTONIX) 40 mg/20 mL PACK Place 20 mLs (40 mg  total) into feeding tube daily. 08/20/17   Allie Bossier, MD  simethicone (MYLICON) 40 GB/2.0FE SUSP Place 0.6 mLs (40 mg total) into feeding tube every 6 (six) hours as needed for flatulence. 08/20/17   Allie Bossier, MD  Vitamin D, Ergocalciferol, (DRISDOL) 50000 units CAPS capsule Place 50,000 Units into feeding tube every Friday.     [provider]  warfarin (COUMADIN) 10 MG tablet Take 1 tablet (10 mg total) by mouth daily at 6 PM. 08/21/17   Allie Bossier, MD  Water For Irrigation, Sterile (FREE WATER) SOLN Place 200 mLs into feeding tube every 8 (eight) hours. 08/20/17   Allie Bossier, MD    Family History No family history on file.  Social History Social History  Tobacco Use  . Smoking status: Never Smoker  . Smokeless tobacco: Never Used  Substance Use Topics  . Alcohol use: No    Frequency: Never  . Drug use: No     Allergies   Patient has no known allergies.   Review of Systems Review of Systems  Unable to perform ROS: Patient nonverbal  Genitourinary: Positive for hematuria.     Physical Exam Updated Vital Signs BP 138/85   Pulse (!) 127   Temp (!) 96.7 F (35.9 C) (Rectal)   Resp 20   Wt 77 kg (169 lb 12.1 oz)   SpO2 94%   BMI 23.68 kg/m   Physical Exam  Constitutional: He appears well-developed and well-nourished. No distress.  Chronically ill appearing  HENT:  Head: Normocephalic and atraumatic.  Mouth/Throat: Oropharynx is clear and moist. No oropharyngeal exudate.  Dry mucous membranes Trach collar in place appears clean  Eyes: Conjunctivae and EOM are normal. Pupils are equal, round, and reactive to light.  Neck: Normal range of motion. Neck supple.  No meningismus.  Cardiovascular: Normal rate, normal heart sounds and intact distal pulses.  No murmur heard. Irregular rhythm in the 110s  Pulmonary/Chest: Effort normal. No respiratory distress. He has rales.  Abdominal: Soft. He exhibits distension. There is tenderness.  There is no rebound and no guarding.  G-tube in place, mild diffuse tenderness Firm area of induration in left lower quadrant  Genitourinary:  Genitourinary Comments: Chronic Foley catheter in place draining cloudy red tinged urine.  There is edema and some drainage around glans.  Right ischial stage IV decubitus ulcer appears clean  Musculoskeletal: Normal range of motion. He exhibits no edema or tenderness.  R AKA appears clean  Neurological: He is alert. No cranial nerve deficit. He exhibits normal muscle tone. Coordination normal.  Patient is alert, moves all extremities, follows some commands  Skin: Skin is warm.  Psychiatric: He has a normal mood and affect. His behavior is normal.  Nursing note and vitals reviewed.    ED Treatments / Results  Labs (all labs ordered are listed, but only abnormal results are displayed) Labs Reviewed  URINALYSIS, ROUTINE W REFLEX MICROSCOPIC - Abnormal; Notable for the following components:      Result Value   Color, Urine AMBER (*)    APPearance CLOUDY (*)    Hgb urine dipstick LARGE (*)    Protein, ur 30 (*)    Leukocytes, UA LARGE (*)    Bacteria, UA RARE (*)    All other components within normal limits  CBC WITH DIFFERENTIAL/PLATELET - Abnormal; Notable for the following components:   WBC 11.4 (*)    RBC 3.32 (*)    Hemoglobin 7.7 (*)    HCT 26.8 (*)    MCH 23.2 (*)    MCHC 28.7 (*)    RDW 17.1 (*)    Neutro Abs 9.1 (*)    All other components within normal limits  COMPREHENSIVE METABOLIC PANEL - Abnormal; Notable for the following components:   Chloride 100 (*)    BUN 48 (*)    Albumin 2.2 (*)    AST 197 (*)    ALT 382 (*)    Alkaline Phosphatase 1,094 (*)    Total Bilirubin 2.6 (*)    All other components within normal limits  BRAIN NATRIURETIC PEPTIDE - Abnormal; Notable for the following components:   B Natriuretic Peptide 641.2 (*)    All other components within normal limits  PROTIME-INR - Abnormal; Notable for  the  following components:   Prothrombin Time 22.5 (*)    All other components within normal limits  I-STAT ARTERIAL BLOOD GAS, ED - Abnormal; Notable for the following components:   pCO2 arterial 55.9 (*)    pO2, Arterial 65.0 (*)    Bicarbonate 32.0 (*)    TCO2 34 (*)    Acid-Base Excess 5.0 (*)    All other components within normal limits  URINE CULTURE  CULTURE, BLOOD (ROUTINE X 2)  CULTURE, BLOOD (ROUTINE X 2)  I-STAT CG4 LACTIC ACID, ED  I-STAT CG4 LACTIC ACID, ED    EKG  EKG Interpretation  Date/Time:  Sunday September 18 2017 03:41:26 EST Ventricular Rate:  112 PR Interval:    QRS Duration: 97 QT Interval:  370 QTC Calculation: 506 R Axis:   19 Text Interpretation:  Atrial fibrillation Ventricular premature complex Low voltage, extremity leads Nonspecific T abnormalities, lateral leads Prolonged QT interval No significant change was found Confirmed by Ezequiel Essex [phone]) on 09/18/2017 3:54:44 AM       Radiology Ct Abdomen Pelvis W Contrast  Result Date: 09/18/2017 CLINICAL DATA:  67 year old male with hematuria and abnormal LFT. EXAM: CT ABDOMEN AND PELVIS WITH CONTRAST TECHNIQUE: Multidetector CT imaging of the abdomen and pelvis was performed using the standard protocol following bolus administration of intravenous contrast. CONTRAST:  163m ISOVUE-300 IOPAMIDOL (ISOVUE-300) INJECTION 61% COMPARISON:  Abdominal radiograph dated 09/18/2017 and CT of the abdomen pelvis dated 08/04/2017 and 08/02/2017. FINDINGS: Evaluation is limited due to streak artifact caused by patient's arms. Lower chest: Partially visualized small bilateral pleural effusions. There is cardiomegaly with coronary vascular calcification. Partially visualized tip of the catheter in the right atrium. There is diffuse interstitial prominence consistent with CHF. Bibasilar subsegmental atelectasis versus infiltrate noted. No intra-abdominal free air. Small free fluid adjacent to the liver. Hepatobiliary:  Minimal irregularity of the hepatic contour concerning for early changes of cirrhosis. Clinical correlation is recommended. A 12 mm subcapsular hypodense focus in the right lobe of the liver (series 3, image 30) is not characterized but may represent an area of scarring. This is similar to the CT of 08/02/2017. there is no intrahepatic biliary ductal dilatation. Layering sludge or small stones noted in the gallbladder. No pericholecystic fluid or evidence of acute cholecystitis by CT. Pancreas: Unremarkable. No pancreatic ductal dilatation or surrounding inflammatory changes. Spleen: Normal in size without focal abnormality. Adrenals/Urinary Tract: The adrenal glands are unremarkable. Mild bilateral hydronephrosis. There is interval improvement of the right-sided hydronephrosis compared to the prior CT. There is a 1 cm left renal cyst. Subcentimeter left renal hypodense lesion is not well characterized. There is mild enhancement of the urothelium. Correlation with urinalysis recommended to exclude UTI. No stone identified. The urinary bladder is unremarkable. A Foley catheter is seen with balloon in the prostatic urethra and tip just entering the bladder. Recommend further advancing of the Foley into the bladder. Stomach/Bowel: A percutaneous gastrostomy is noted with balloon and tip in the body of the stomach. Moderate amount of stool noted throughout the colon. There is no bowel obstruction or active inflammation. Vascular/Lymphatic: There is moderate aortoiliac atherosclerotic disease. 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 There is reconstitution of the flow in the distal right external iliac artery. The origins of the celiac axis, SMA remain patent. No portal venous gas. There is no adenopathy. Mildly rounded lymph node along the right iliac chain measures 9 mm in diameter  similar to prior CT. Reproductive: Mildly enlarged prostate gland  measures 5.5 cm in diameter. Other: Right ischial deep decubitus ulcer extends close to the ischial tuberosity. No evidence of osteomyelitis by CT. No drainable fluid collection or abscess. Musculoskeletal: No acute osseous pathology. IMPRESSION: 1. Cardiomegaly with findings of CHF, small bilateral pleural effusions, and bibasilar atelectasis versus infiltrate. Clinical correlation is recommended. 2. Mild bilateral hydronephrosis. Correlation with urinalysis recommended to exclude UTI. 3. Foley catheter with balloon in the prostatic urethra. Recommend further advancing the catheter into the bladder. 4. No bowel obstruction or active inflammation. Percutaneous gastrostomy within the stomach. 5. Slight irregularity of the hepatic contour concerning for early changes of cirrhosis. Clinical correlation is recommended. 6. Layering sludge or small stones. 7. Advanced Aortic Atherosclerosis (ICD10-I70.0). 8. 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 This is of indeterminate chronicity but likely chronic. Clinical correlation is recommended. 9. Right ischial decubitus ulcer. No fluid collection or abscess. No evidence of osteomyelitis by CT. Clinical correlation is recommended. Electronically Signed   By: Anner Crete M.D.   On: 09/18/2017 06:19   Dg Chest Portable 1 View  Result Date: 09/18/2017 CLINICAL DATA:  Shortness of breath, on treatment for urinary tract infection. EXAM: PORTABLE CHEST 1 VIEW COMPARISON:  Chest radiograph August 15, 2017 FINDINGS: The cardiac silhouette is mildly enlarged and unchanged. Increasing interstitial prominence with retrocardiac consolidation. Small pleural effusions. No pneumothorax. Tracheostomy tube tip projects 5.3 cm above the carina. RIGHT PICC distal tip projecting cavoatrial junction. No pneumothorax. Soft tissue planes and included osseous structures are nonacute. Osteopenia.  IMPRESSION: Cardiomegaly. Increasing interstitial prominence seen with atypical infection or pulmonary edema with retrocardiac consolidation. Small pleural effusions. RIGHT PICC distal tip projects at cavoatrial junction. Tracheostomy tube in situ. Electronically Signed   By: Elon Alas M.D.   On: 09/18/2017 04:08   Dg Abd Portable 2 Views  Result Date: 09/18/2017 CLINICAL DATA:  Shortness of breath, on treatment for urinary tract infection. No bowel movement for 4 days. EXAM: PORTABLE ABDOMEN - 2 VIEW COMPARISON:  Abdominal radiograph August 17, 2017 FINDINGS: Gastrostomy tube projects in stomach. Bowel gas pattern is nondilated and nonobstructive. Large body habitus. Phleboliths project in the pelvis. No intra-abdominal mass effect. Limited assessment for free air due to habitus. Soft tissue planes and included osseous structures are unchanged. IMPRESSION: Nonspecific bowel gas pattern.  Gastrostomy tube in situ. Electronically Signed   By: Elon Alas M.D.   On: 09/18/2017 04:10    Procedures Procedures (including critical care time)  Medications Ordered in ED Medications  vancomycin (VANCOCIN) 1,500 mg in sodium chloride 0.9 % 500 mL IVPB (1,500 mg Intravenous New Bag/Given 09/18/17 0446)  vancomycin (VANCOCIN) IVPB 1000 mg/200 mL premix (not administered)  ceFEPIme (MAXIPIME) 2 g in dextrose 5 % 50 mL IVPB (not administered)  ceFEPIme (MAXIPIME) 2 g in dextrose 5 % 50 mL IVPB (0 g Intravenous Stopped 09/18/17 0443)  iopamidol (ISOVUE-300) 61 % injection (100 mLs  Contrast Given 09/18/17 0547)     Initial Impression / Assessment and Plan / ED Course  I have reviewed the triage vital signs and the nursing notes.  Pertinent labs & imaging results that were available during my care of the patient were reviewed by me and considered in my medical decision making (see chart for details).    Patient from care facility with hematuria in his chronic Foley.  Also with abdominal  distention  and constipation.  Patient with recent complicated hospitalization with VRE UTI and Pseudomonas pneumonia, discharged 1 month ago.  Also found to have right-sided hydronephrosis and hemorrhage which resolved prior to discharge  Workup remarkable for urinary tract infection with normal lactate.  Patient started on IV antibiotics Transaminitis noted.  CT a/p ordered.  D/w Dr. Beatrix Shipper of critical care.  Patient is hemodynamically stable and on ventilator only at night.  He feels patient can be admitted to the hospitalist service and critical care will consult.  CT scan findings remarkable for hydronephrosis with foley in prostatic urethra. This was replaced by nursing. Findings of CHF, possible HCAP, lasix given. BP normal.  Meropenem added with history of VRE. RUQ [place] pending to further evaluation LFTs Admission to stepdown d/w Dr. Blaine Hamper.  CRITICAL CARE Performed by: Ezequiel Essex Total critical care time: 45 minutes Critical care time was exclusive of separately billable procedures and treating other patients. Critical care was necessary to treat or prevent imminent or life-threatening deterioration. Critical care was time spent personally by me on the following activities: development of treatment plan with patient and/or surrogate as well as nursing, discussions with consultants, evaluation of patient's response to treatment, examination of patient, obtaining history from patient or surrogate, ordering and performing treatments and interventions, ordering and review of laboratory studies, ordering and review of radiographic studies, pulse oximetry and re-evaluation of patient's condition.  Final Clinical Impressions(s) / ED Diagnoses   Final diagnoses:  HCAP (healthcare-associated pneumonia)  Urinary tract infection with hematuria, site unspecified  Elevated LFTs    ED Discharge Orders    None       Meredyth Hornung, Annie Main, MD 09/18/17 2007

## 2017-09-18 NOTE — Progress Notes (Signed)
MD paged and made aware that pt had bradycardia earlier with suctioning and an hour ago had 6.3 second pause that was called to RN by CCMD.  When went to room to check pt, pt had popped off vent.  Placed back on vent and has been tachy at times and in Afib.

## 2017-09-18 NOTE — Progress Notes (Signed)
Patient came from [facility] and RT was told that he is vent dependent at night.  RT tried the patient on the settings that he is on at the SNF ; SIMV Vt 500 RR 6 FiO2 35% PS 12 PEEP 5 and patient didn't tolerated these settings on the Servo-I ventilator.  [facility]  [facility]t 500,RR 16, FiO2 40%, PEEP 5.

## 2017-09-18 NOTE — ED Notes (Signed)
Hand of report given to floor RN

## 2017-09-18 NOTE — ED Notes (Signed)
Update given to the family by the MD. Pt rolled to check for BM. No BM.

## 2017-09-19 ENCOUNTER — Inpatient Hospital Stay (HOSPITAL_COMMUNITY): Payer: Medicare Other

## 2017-09-19 DIAGNOSIS — R1310 Dysphagia, unspecified: Secondary | ICD-10-CM | POA: Diagnosis not present

## 2017-09-19 DIAGNOSIS — R945 Abnormal results of liver function studies: Secondary | ICD-10-CM | POA: Diagnosis not present

## 2017-09-19 DIAGNOSIS — T83518A Infection and inflammatory reaction due to other urinary catheter, initial encounter: Secondary | ICD-10-CM | POA: Diagnosis not present

## 2017-09-19 DIAGNOSIS — R0902 Hypoxemia: Secondary | ICD-10-CM | POA: Diagnosis not present

## 2017-09-19 DIAGNOSIS — A4152 Sepsis due to Pseudomonas: Secondary | ICD-10-CM | POA: Diagnosis not present

## 2017-09-19 DIAGNOSIS — Z930 Tracheostomy status: Secondary | ICD-10-CM | POA: Diagnosis not present

## 2017-09-19 DIAGNOSIS — L89314 Pressure ulcer of right buttock, stage 4: Secondary | ICD-10-CM | POA: Diagnosis not present

## 2017-09-19 DIAGNOSIS — J9621 Acute and chronic respiratory failure with hypoxia: Secondary | ICD-10-CM | POA: Diagnosis not present

## 2017-09-19 DIAGNOSIS — A4151 Sepsis due to Escherichia coli [E. coli]: Secondary | ICD-10-CM | POA: Diagnosis not present

## 2017-09-19 LAB — COMPREHENSIVE METABOLIC PANEL
ALT: 297 U/L — ABNORMAL HIGH (ref 17–63)
ANION GAP: 8 (ref 5–15)
AST: 152 U/L — ABNORMAL HIGH (ref 15–41)
Albumin: 1.8 g/dL — ABNORMAL LOW (ref 3.5–5.0)
Alkaline Phosphatase: 1573 U/L — ABNORMAL HIGH (ref 38–126)
BILIRUBIN TOTAL: 1.7 mg/dL — AB (ref 0.3–1.2)
BUN: 44 mg/dL — ABNORMAL HIGH (ref 6–20)
CO2: 31 mmol/L (ref 22–32)
Calcium: 9 mg/dL (ref 8.9–10.3)
Chloride: 104 mmol/L (ref 101–111)
Creatinine, Ser: 0.87 mg/dL (ref 0.61–1.24)
Glucose, Bld: 110 mg/dL — ABNORMAL HIGH (ref 65–99)
POTASSIUM: 4.3 mmol/L (ref 3.5–5.1)
Sodium: 143 mmol/L (ref 135–145)
TOTAL PROTEIN: 7.1 g/dL (ref 6.5–8.1)

## 2017-09-19 LAB — URINALYSIS, ROUTINE W REFLEX MICROSCOPIC
Bilirubin Urine: NEGATIVE
GLUCOSE, UA: NEGATIVE mg/dL
Ketones, ur: NEGATIVE mg/dL
NITRITE: NEGATIVE
PH: 5 (ref 5.0–8.0)
Protein, ur: NEGATIVE mg/dL
SPECIFIC GRAVITY, URINE: 1.017 (ref 1.005–1.030)

## 2017-09-19 LAB — CBC
HCT: 23.4 % — ABNORMAL LOW (ref 39.0–52.0)
Hemoglobin: 6.9 g/dL — CL (ref 13.0–17.0)
MCH: 23.5 pg — AB (ref 26.0–34.0)
MCHC: 29.5 g/dL — ABNORMAL LOW (ref 30.0–36.0)
MCV: 79.9 fL (ref 78.0–100.0)
PLATELETS: 242 10*3/uL (ref 150–400)
RBC: 2.93 MIL/uL — ABNORMAL LOW (ref 4.22–5.81)
RDW: 17.4 % — AB (ref 11.5–15.5)
WBC: 11.3 10*3/uL — AB (ref 4.0–10.5)

## 2017-09-19 LAB — URINE CULTURE: Culture: NO GROWTH

## 2017-09-19 LAB — HIV ANTIBODY (ROUTINE TESTING W REFLEX): HIV Screen 4th Generation wRfx: NONREACTIVE

## 2017-09-19 LAB — HEMOGLOBIN
HEMOGLOBIN: 8 g/dL — AB (ref 13.0–17.0)
Hemoglobin: 8 g/dL — ABNORMAL LOW (ref 13.0–17.0)

## 2017-09-19 LAB — APTT: aPTT: 49 seconds — ABNORMAL HIGH (ref 24–36)

## 2017-09-19 LAB — HEMOGLOBIN AND HEMATOCRIT, BLOOD
HEMATOCRIT: 26.6 % — AB (ref 39.0–52.0)
HEMOGLOBIN: 8 g/dL — AB (ref 13.0–17.0)

## 2017-09-19 LAB — PROTIME-INR
INR: 1.48
PROTHROMBIN TIME: 17.8 s — AB (ref 11.4–15.2)

## 2017-09-19 LAB — GAMMA GT: GGT: 404 U/L — ABNORMAL HIGH (ref 7–50)

## 2017-09-19 LAB — PREPARE RBC (CROSSMATCH)

## 2017-09-19 LAB — MAGNESIUM: Magnesium: 2.1 mg/dL (ref 1.7–2.4)

## 2017-09-19 MED ORDER — COLLAGENASE 250 UNIT/GM EX OINT
TOPICAL_OINTMENT | Freq: Every day | CUTANEOUS | Status: DC
  Administered 2017-09-20 – 2017-09-25 (×6): via TOPICAL
  Filled 2017-09-19: qty 30

## 2017-09-19 MED ORDER — SODIUM CHLORIDE 0.9% FLUSH: 10.0000 mL | INTRAVENOUS | Status: DC | PRN

## 2017-09-19 MED ORDER — SODIUM CHLORIDE 0.9 % IV SOLN
Freq: Once | INTRAVENOUS | Status: AC
  Administered 2017-09-19: 04:00:00 via INTRAVENOUS

## 2017-09-19 MED ORDER — COLLAGENASE 250 UNIT/GM EX OINT: TOPICAL_OINTMENT | Freq: Every day | CUTANEOUS | Status: DC

## 2017-09-19 MED ORDER — SODIUM CHLORIDE 0.9% FLUSH
10.0000 mL | Freq: Two times a day (BID) | INTRAVENOUS | Status: DC
  Administered 2017-09-19 – 2017-09-24 (×10): 10 mL

## 2017-09-19 MED ORDER — JEVITY 1.2 CAL PO LIQD: 1000.0000 mL | ORAL | Status: DC

## 2017-09-19 MED ORDER — CIPROFLOXACIN 500 MG/5ML (10%) PO SUSR
500.0000 mg | Freq: Two times a day (BID) | ORAL | Status: DC
  Filled 2017-09-19: qty 5

## 2017-09-19 MED ORDER — CHLORHEXIDINE GLUCONATE CLOTH 2 % EX PADS
6.0000 | MEDICATED_PAD | Freq: Every day | CUTANEOUS | Status: DC
  Administered 2017-09-19 – 2017-09-20 (×2): 6 via TOPICAL

## 2017-09-19 MED ORDER — CIPROFLOXACIN 500 MG/5ML (10%) PO SUSR
750.0000 mg | Freq: Two times a day (BID) | ORAL | Status: DC
  Administered 2017-09-19 (×2): 750 mg via ORAL
  Filled 2017-09-19 (×3): qty 7.5

## 2017-09-19 MED ORDER — FUROSEMIDE 10 MG/ML IJ SOLN
20.0000 mg | Freq: Every day | INTRAMUSCULAR | Status: DC
  Administered 2017-09-19 – 2017-09-25 (×7): 20 mg via INTRAVENOUS
  Filled 2017-09-19 (×7): qty 2

## 2017-09-19 NOTE — Progress Notes (Signed)
Pt on trach collar 8L/35% for approximately 5 minutes and pt began stating he could not breathe and wanted to go back on vent. VS within normal limits, no distress noted, no increased wob. Pt placed back on vent on FS at 0750.

## 2017-09-19 NOTE — Progress Notes (Signed)
Patient ID: Craig DiegoCharlie J Admire Jr., male   DOB: 1950/12/08, 67 y.o.   MRN: [id number]          [facility]    Date of Admission:  09/18/2017   Day 2 meropenem        Day 2 ciprofloxacin         He seems more clinically stable overnight but is now febrile to 101 degrees.  Urine and blood cultures are negative.  His chest x-ray shows worsening bilateral infiltrates.  I will continue current antibiotics and order a repeat sputum culture.  Cliffton AstersJohn Marcoantonio Legault, MD [facility] [phone] pager   [phone] cell 09/19/2017, 2:07 PM

## 2017-09-19 NOTE — Consult Note (Addendum)
WOC Nurse wound consult note Reason for Consult: [facility]; refer to progress notes on 11/27 and 12/13.   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  Bone palpable when swab was inserted. 70% red, 30% yellow slough, mod amt tan drainage.  It is difficult to keep wound from becoming soiled since pt is frequently incontinent of loose stool and it is located close to the rectum.  Stool is not watery enough for a Flexiseal. Wound type: Patchy areas of partial thickness skin loss to bilat buttocks and sacrum.  There is pink dry scar tissue from a previous wound which has healed.  Partail thickness wounds are red and moist; appearance is consistent with moisture associated skin damage.   Tip of penis has a full thickness split; 1X.2cm,with patchy dark black areas. Barrier cream to repel moisture and protect and promote healing to sacrum and buttocks.  Pt is on a low air loss bed to reduce pressure.  Continue Santyl ointment to provide enzymatic debridement of nonviable tissue to right ischium. Xeroform gauze to tip of penis; there is a  full thickness split on the end of his penis; Topical treatment will not be effective to promote healing.  Please consult urology for further plan of care if desired.  No family present to discuss plan of care. Please re-consult if further assistance is needed.  Thank-you,  Cammie Mcgeeawn Estell Puccini MSN, RN, CWOCN, [place]CN-AP, CNS [phone]

## 2017-09-19 NOTE — Progress Notes (Signed)
PROGRESS NOTE    Craig J Durel Salts.  TIW:[id number] DOB: 1950-11-02 DOA: 09/18/2017 PCP: System, Pcp Not In     Brief Narrative:  Craig Kirschner. is a 67 y.o. 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 Review of records from the nursing facility shows that on January 11 he grew out a multidrug resistant E. coli sensitive to meropenem.  He also had a sputum culture positive for Serratia marcescens and Moraxella catarrhalis with heavy growth.  He also had light growth of Pseudomonas which may be a colonizer for him. Patient was admitted for further treatment.   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 Coli CAUTI, POA, as well as HCAP positive for Serratia and Moraxella  -Culture and sensitivity report from [employer], in paper chart  -ID consulted -Continue merrem/cipro    Acute on chronic hypoxemic respiratory failure  -Patient with chronic respiratory failure requiring ventilator at night, trach collar during the day -Continue vent support, PCCM following  -CXR with fluid overload, start IV lasix    Acute on chronic systolic CHF  -Recent echo 07/2017 showed EF 40-45%, global hypokinesis -Start IV lasix  -Daily weight, strict I/Os   Decubitus ulcer of right ischial area stage IV POA -Wound RN consulted   Chronic atrial fibrillation  -Currently rate controlled, continue diltiazem, lopressor  -Hold coumadin for now due to anemia   Acute posthemorrhagic anemia on chronic anemia of chronic disease -Baseline Hgb around 8  -Patient with significant hematuria due to catheter balloon being in his ureter.  Will continue to hold his anticoagulation. Transfused 2u pRBC this morning. Monitor.   Cerebellar atrophy and paraplegia, neurogenic bladder, dysphagia  -Bedbound, on trach-vent, PEG, chronic foley   Cirrhosis of the liver with elevated liver enzymes -No known history of liver disease, although he has had chronically elevated alk phos as high as 255 in 2018 as well as AST 59. Now presenting with Alk phos 1094, AST 197, ALT 382. ?Etiology secondary to hepatic injury in setting of sepsis -Abdominal US revealed gallstones and sludge with small amount pericholecystic fluid, as well as cirrhosis -Check hepatitis panel, GGT. Platelets are within normal limit 264  -Hold lipitor and tylenol  -Trend LFT   Hypothyroidism -Continue synthroid   PAD s/p AKA of  RLE -Hold coumadin in setting of anemia   Full thickness split of penis -Noted last admission 12/13, outpatient urology with Dr. Gloriann Loan planned    DVT prophylaxis: SCD Code Status: Full Family Communication: No family at  bedside Disposition Plan: Pending improvement   Consultants:   ID   Procedures:   None   Antimicrobials:  Anti-infectives (From admission, onward)   Start     Dose/Rate Route Frequency Ordered Stop   09/19/17 1000  ciprofloxacin (CIPRO) 500 MG/5ML (10%) suspension 750 mg     750 mg Oral 2 times daily 09/19/17 0904     09/19/17 0900  ciprofloxacin (CIPRO) 500 MG/5ML (10%) suspension 500 mg  Status:  Discontinued     500 mg Oral 2 times daily 09/19/17 0857 09/19/17 0904   09/19/17 0600  vancomycin (VANCOCIN) IVPB 1000 mg/200 mL premix  Status:  Discontinued     1,000 mg 200 mL/hr over 60 Minutes Intravenous Every 24 hours 09/18/17 0503 09/18/17 0855   09/19/17 0500  ceFEPIme (MAXIPIME) 2 g in dextrose 5 % 50 mL IVPB  Status:  Discontinued     2 g 100 mL/hr over 30 Minutes Intravenous Every 24 hours 09/18/17 0503 09/18/17 0607   09/18/17 1400  meropenem (MERREM) 1 g in sodium chloride 0.9 % 100 mL IVPB     1 g 200 mL/hr over 30 Minutes Intravenous Every 8 hours 09/18/17 0627     09/18/17 0415  ceFEPIme (MAXIPIME) 2 g in dextrose 5 % 50 mL IVPB     2 g 100 mL/hr over 30 Minutes Intravenous  Once 09/18/17 0405 09/18/17 0443   09/18/17 0415  vancomycin (VANCOCIN) IVPB 1000 mg/200 mL premix  Status:  Discontinued     1,000 mg 200 mL/hr over 60 Minutes Intravenous  Once 09/18/17 0405 09/18/17 0411   09/18/17 0415  vancomycin (VANCOCIN) 1,500 mg in sodium chloride 0.9 % 500 mL IVPB     1,500 mg 250 mL/hr over 120 Minutes Intravenous  Once 09/18/17 0411 09/18/17 0646       Subjective: Patient alert, able to mouth simple yes/no to questions. Able to tell me he's in a hospice. Denies abdominal pain, denies chest pain.     Objective: Vitals:   09/19/17 0958 09/19/17 1110 09/19/17 1140 09/19/17 1150  BP: 113/76 112/68 110/68 112/68  Pulse: (!) 109 89 (!) 103 83  Resp:  (!) 22 18 (!) 25  Temp:   99.7 F (37.6 C) (!) 101.1 F (38.4 C)  TempSrc:   Oral Oral  SpO2:  99% 100% 99%    Weight:      Height:        Intake/Output Summary (Last 24 hours) at 09/19/2017 1341 Last data filed at 09/19/2017 1140 Gross per 24 hour  Intake 3452.42 ml  Output 2000 ml  Net 1452.42 ml   Filed Weights   09/18/17 0416 09/18/17 1215 09/19/17 0343  Weight: 77 kg (169 lb 12.1 oz) 77.9 kg (171 lb 11.8 oz) 80 kg (176 lb 5.9 oz)    Examination:  General exam: Appears calm, on trach-vent  Respiratory system: On trach-vent  Cardiovascular system: S1 & S2 heard. No JVD, murmurs, rubs, gallops or clicks. No pedal edema. Gastrointestinal system: Abdomen is nondistended, soft and nontender. Normal bowel sounds heard. +PEG  Central nervous system: Alert, able to follow simple commands but remains paraplegic with minimal movement of left hand  Extremities: Right AKA   Data Reviewed: I have personally reviewed following labs and imaging  studies  CBC: Recent Labs  Lab 09/18/17 0347 09/18/17 1309 09/18/17 1940 09/19/17 0200 09/19/17 0758  WBC 11.4* 11.6*  --  11.3*  --   NEUTROABS 9.1* 9.0*  --   --   --   HGB 7.7* 7.3* 7.0* 6.9* 8.0*  HCT 26.8* 25.7*  --  23.4*  --   MCV 80.7 80.3  --  79.9  --   PLT 262 264  --  242  --    Basic Metabolic Panel: Recent Labs  Lab 09/18/17 0347 09/18/17 1309 09/19/17 0200 09/19/17 0758  NA 139 142 143  --   K 4.5 4.4 4.3  --   CL 100* 101 104  --   CO2 28 29 31   --   GLUCOSE 91 76 110*  --   BUN 48* 41* 44*  --   CREATININE 0.75 0.81 0.87  --   CALCIUM 9.2 9.2 9.0  --   MG  --   --   --  2.1   GFR: Estimated Creatinine Clearance: 88.6 mL/min (by C-G formula based on SCr of 0.87 mg/dL). Liver Function Tests: Recent Labs  Lab 09/18/17 0347 09/18/17 1309 09/19/17 0200  AST 197* 174* 152*  ALT 382* 352* 297*  ALKPHOS 1,094* 1,379* 1,573*  BILITOT 2.6* 2.6* 1.7*  PROT 8.0 8.0 7.1  ALBUMIN 2.2* 1.9* 1.8*   No results for input(s): LIPASE, AMYLASE in the last 168 hours. No results for input(s): AMMONIA in the last 168  hours. Coagulation Profile: Recent Labs  Lab 09/18/17 0347 09/18/17 1309 09/19/17 0200  INR 2.00 1.63 1.48   Cardiac Enzymes: No results for input(s): CKTOTAL, CKMB, CKMBINDEX, TROPONINI in the last 168 hours. BNP (last 3 results) No results for input(s): PROBNP in the last 8760 hours. HbA1C: No results for input(s): HGBA1C in the last 72 hours. CBG: No results for input(s): GLUCAP in the last 168 hours. Lipid Profile: No results for input(s): CHOL, HDL, LDLCALC, TRIG, CHOLHDL, LDLDIRECT in the last 72 hours. Thyroid Function Tests: No results for input(s): TSH, T4TOTAL, FREET4, T3FREE, THYROIDAB in the last 72 hours. Anemia Panel: No results for input(s): VITAMINB12, FOLATE, FERRITIN, TIBC, IRON, RETICCTPCT in the last 72 hours. Sepsis Labs: Recent Labs  Lab 09/18/17 0414 09/18/17 0616 09/18/17 0855 09/18/17 1309  PROCALCITON  --   --   --  1.60  LATICACIDVEN 0.62 0.7 0.7 0.9    Recent Results (from the past 240 hour(s))  Urine Culture     Status: None   Collection Time: 09/18/17  3:37 AM  Result Value Ref Range Status   Specimen Description URINE, RANDOM  Final   Special Requests NONE  Final   Culture NO GROWTH  Final   Report Status 09/19/2017 FINAL  Final  MRSA PCR Screening     Status: None   Collection Time: 09/18/17 12:20 PM  Result Value Ref Range Status   MRSA by PCR NEGATIVE NEGATIVE Final    Comment:        The GeneXpert MRSA Assay (FDA approved for NASAL specimens only), is one component of a comprehensive MRSA colonization surveillance program. It is not intended to diagnose MRSA infection nor to guide or monitor treatment for MRSA infections.        Radiology Studies: Ct Abdomen Pelvis W Contrast  Result Date: 09/18/2017 CLINICAL DATA:  67 year old male with hematuria and abnormal LFT. EXAM: CT ABDOMEN AND PELVIS WITH CONTRAST TECHNIQUE: Multidetector CT imaging of the abdomen and pelvis was performed using  the standard protocol following  bolus administration of intravenous contrast. CONTRAST:  129m ISOVUE-300 IOPAMIDOL (ISOVUE-300) INJECTION 61% COMPARISON:  Abdominal radiograph dated 09/18/2017 and CT of the abdomen pelvis dated 08/04/2017 and 08/02/2017. FINDINGS: Evaluation is limited due to streak artifact caused by patient's arms. Lower chest: Partially visualized small bilateral pleural effusions. There is cardiomegaly with coronary vascular calcification. Partially visualized tip of the catheter in the right atrium. There is diffuse interstitial prominence consistent with CHF. Bibasilar subsegmental atelectasis versus infiltrate noted. No intra-abdominal free air. Small free fluid adjacent to the liver. Hepatobiliary: Minimal irregularity of the hepatic contour concerning for early changes of cirrhosis. Clinical correlation is recommended. A 12 mm subcapsular hypodense focus in the right lobe of the liver (series 3, image 30) is not characterized but may represent an area of scarring. This is similar to the CT of 08/02/2017. there is no intrahepatic biliary ductal dilatation. Layering sludge or small stones noted in the gallbladder. No pericholecystic fluid or evidence of acute cholecystitis by CT. Pancreas: Unremarkable. No pancreatic ductal dilatation or surrounding inflammatory changes. Spleen: Normal in size without focal abnormality. Adrenals/Urinary Tract: The adrenal glands are unremarkable. Mild bilateral hydronephrosis. There is interval improvement of the right-sided hydronephrosis compared to the prior CT. There is a 1 cm left renal cyst. Subcentimeter left renal hypodense lesion is not well characterized. There is mild enhancement of the urothelium. Correlation with urinalysis recommended to exclude UTI. No stone identified. The urinary bladder is unremarkable. A Foley catheter is seen with balloon in the prostatic urethra and tip just entering the bladder. Recommend further advancing of the Foley into the bladder. Stomach/Bowel:  A percutaneous gastrostomy is noted with balloon and tip in the body of the stomach. Moderate amount of stool noted throughout the colon. There is no bowel obstruction or active inflammation. Vascular/Lymphatic: There is moderate aortoiliac atherosclerotic disease. 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 There is reconstitution of the flow in the distal right external iliac artery. The origins of the celiac axis, SMA remain patent. No portal venous gas. There is no adenopathy. Mildly rounded lymph node along the right iliac chain measures 9 mm in diameter similar to prior CT. Reproductive: Mildly enlarged prostate gland measures 5.5 cm in diameter. Other: Right ischial deep decubitus ulcer extends close to the ischial tuberosity. No evidence of osteomyelitis by CT. No drainable fluid collection or abscess. Musculoskeletal: No acute osseous pathology. IMPRESSION: 1. Cardiomegaly with findings of CHF, small bilateral pleural effusions, and bibasilar atelectasis versus infiltrate. Clinical correlation is recommended. 2. Mild bilateral hydronephrosis. Correlation with urinalysis recommended to exclude UTI. 3. Foley catheter with balloon in the prostatic urethra. Recommend further advancing the catheter into the bladder. 4. No bowel obstruction or active inflammation. Percutaneous gastrostomy within the stomach. 5. Slight irregularity of the hepatic contour concerning for early changes of cirrhosis. Clinical correlation is recommended. 6. Layering sludge or small stones. 7. Advanced Aortic Atherosclerosis (ICD10-I70.0). 8. 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 This is of indeterminate chronicity but likely chronic. Clinical correlation is recommended. 9. Right ischial decubitus ulcer. No fluid collection or abscess. No evidence of osteomyelitis by CT.  Clinical correlation is recommended. Electronically Signed   By: AAnner CreteM.D.   On: 09/18/2017 06:19   Portable Chest 1 View  Result Date: 09/19/2017 CLINICAL DATA:  Tracheostomy. EXAM: PORTABLE CHEST 1  VIEW COMPARISON:  09/18/2017. FINDINGS: Tracheostomy is midline. Right PICC tip projects over the SVC RA junction. Heart is enlarged, stable. Mild to moderate mixed interstitial and airspace opacification, similar. Small bilateral pleural effusions. IMPRESSION: Congestive heart failure. Electronically Signed   By: Lorin Picket M.D.   On: 09/19/2017 07:39   Dg Chest Port 1 View  Result Date: 09/18/2017 CLINICAL DATA:  Sepsis EXAM: PORTABLE CHEST 1 VIEW COMPARISON:  340 hours FINDINGS: Stable tracheostomy tube and right PICC. Airspace disease throughout both lungs is stable on the right and worse on the left. No pneumothorax. Small pleural effusions are grossly unchanged. IMPRESSION: Diffuse bilateral airspace disease is stable on the right but worse on the left. Small pleural effusions. Electronically Signed   By: Marybelle Killings M.D.   On: 09/18/2017 12:50   Dg Chest Portable 1 View  Result Date: 09/18/2017 CLINICAL DATA:  Shortness of breath, on treatment for urinary tract infection. EXAM: PORTABLE CHEST 1 VIEW COMPARISON:  Chest radiograph August 15, 2017 FINDINGS: The cardiac silhouette is mildly enlarged and unchanged. Increasing interstitial prominence with retrocardiac consolidation. Small pleural effusions. No pneumothorax. Tracheostomy tube tip projects 5.3 cm above the carina. RIGHT PICC distal tip projecting cavoatrial junction. No pneumothorax. Soft tissue planes and included osseous structures are nonacute. Osteopenia. IMPRESSION: Cardiomegaly. Increasing interstitial prominence seen with atypical infection or pulmonary edema with retrocardiac consolidation. Small pleural effusions. RIGHT PICC distal tip projects at cavoatrial junction. Tracheostomy tube in situ. Electronically  Signed   By: Elon Alas M.D.   On: 09/18/2017 04:08   Dg Abd Portable 2 Views  Result Date: 09/18/2017 CLINICAL DATA:  Shortness of breath, on treatment for urinary tract infection. No bowel movement for 4 days. EXAM: PORTABLE ABDOMEN - 2 VIEW COMPARISON:  Abdominal radiograph August 17, 2017 FINDINGS: Gastrostomy tube projects in stomach. Bowel gas pattern is nondilated and nonobstructive. Large body habitus. Phleboliths project in the pelvis. No intra-abdominal mass effect. Limited assessment for free air due to habitus. Soft tissue planes and included osseous structures are unchanged. IMPRESSION: Nonspecific bowel gas pattern.  Gastrostomy tube in situ. Electronically Signed   By: Elon Alas M.D.   On: 09/18/2017 04:10   US Abdomen Limited Ruq  Result Date: 09/18/2017 CLINICAL DATA:  Elevated LFTs EXAM: ULTRASOUND ABDOMEN LIMITED RIGHT UPPER QUADRANT COMPARISON:  CT 09/18/2017 FINDINGS: Gallbladder: Small layering gallstones and sludge within the gallbladder. Small amount of pericholecystic fluid. Normal wall thickness. Negative sonographic Murphy's. Common bile duct: Diameter: Normal caliber, 5-6 mm. Liver: Slightly nodular contours of the liver suggests the possibility of early cirrhosis. No focal hepatic abnormality. Portal vein is patent on color Doppler imaging with normal direction of blood flow towards the liver. IMPRESSION: Layering gall stones and sludge within the gallbladder. There is a small amount of pericholecystic fluid. There is no wall thickening or sonographic Murphy's sign. This fluid could be related to liver disease. Mildly nodular contours of the liver suggests cirrhosis. Electronically Signed   By: Rolm Baptise M.D.   On: 09/18/2017 07:42      Scheduled Meds: . baclofen  5 mg Per Tube BID  . chlorhexidine gluconate (MEDLINE KIT)  15 mL Mouth Rinse BID  . ciprofloxacin  750 mg Oral BID  . collagenase   Topical Daily  . [START ON 09/20/2017] Darbepoetin Alfa   25 mcg Subcutaneous Q28 days  . diltiazem  30 mg Per Tube Q6H  . fluticasone  2 spray Each Nare Daily  . free water  200 mL  Per Tube Q8H  . furosemide  20 mg Intravenous Daily  . glycopyrrolate  1 mg Per Tube BID  . ipratropium-albuterol  3 mL Nebulization TID  . levothyroxine  125 mcg Per Tube QAC breakfast  . metoprolol tartrate  12.5 mg Per Tube BID  . montelukast  10 mg Per Tube QHS  . multivitamin with minerals  1 tablet Per Tube Daily  . pantoprazole sodium  40 mg Per Tube Daily  . polyethylene glycol  17 g Oral Daily  . sodium chloride flush  3 mL Intravenous Q12H  . [START ON 09/23/2017] Vitamin D (Ergocalciferol)  50,000 Units Per Tube Q Fri   Continuous Infusions: . feeding supplement (VITAL AF 1.2 CAL) 1,000 mL (09/19/17 1318)  . meropenem (MERREM) IV 1 g (09/19/17 0532)     LOS: 1 day    Time spent: 60 minutes   Dessa Phi, DO [facility] [email or web address] Password [email or web address] 09/19/2017, 1:41 PM

## 2017-09-19 NOTE — Progress Notes (Signed)
Upon assessment of this patient a nonprotocol central line dressing was found to be in place over his right single lumen PICC.  RN charge made aware of findings.  Dressing removed, and a new central line dressing as per protocol was applied under sterile procedures..  This nurse was concerned with placement of the picc, therefore, this nurse initiated standard protocol  to verify PICC placement.

## 2017-09-19 NOTE — Progress Notes (Signed)
CRITICAL VALUE ALERT  Critical Value:  Hgb 6.9  Date & Time Notied:  09/19/17 @ 0247  Provider Notified: Bodenheimer NP  Orders Received/Actions taken: see MAR and Epic for orders

## 2017-09-19 NOTE — Progress Notes (Signed)
[facility] Progress Note  Patient Name: Craig Oneal. MRN: [id number] DOB: [birth year]    ADMISSION DATE:  09/18/2017 CONSULTATION DATE:  09/18/2017  REFERRING MD:  Dr. Randa Spike  REASON FOR CONSULTATION:  chronic respiratory failure   ASSESSMENT/PLAN:  ASSESSMENT (included in the Hospital Problem List)  .Principal Problem:   Sepsis ([place]) Active Problems:   Decubitus ulcer of right ischial area, stage IV (HCC)   Acute on chronic respiratory failure with hypoxemia (HCC)   Bilateral hydronephrosis   E-coli UTI   Pneumonia due to Serratia marcescens (HCC)   Moraxella catarrhalis pneumonia (HCC)   Atrial fibrillation with rapid ventricular response (HCC)   Chronic indwelling Foley catheter   Acute posthemorrhagic anemia   Chronic bilateral pleural effusions   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  Trach collar trial. This patient should only need mechanical ventilatory support at night.  Speech therapy consult (Passy-Muir valve)   NUTRITION: Vital AF 1.2   DVT PROPHYLAXIS: SCDs. Chemoprophylaxis on hold due to hematuria at the time of presentation.  GI PROPHYLAXIS: Protonix    SUBJECTIVE:   -Interim events: on meropenem/ciprofloxacin.  HISTORY OF PRESENT ILLNESS This 67 y.o. African American  male is seen in consultation at the request of Dr. Randa Spike for recommendations on further evaluation and management of chronic respiratory failure. He was transferred from [facility] with complaints of blood-tinged urine in Foley catheter noticed this morning.He is  on Coumadin for atrial fibrillation, which has already been stopped with additional administration of vitamin K 5 mg.  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 He has chronic respiratory failure and requires nocturnal ventilator use. He underwent tracheostomy and PEG placement in 07/2016. He has a chronic indwelling Foley.  Trach aspirate isolated S marcescens, M catarrhalis (beta lactamase-positive) and carbapenem resistant-P aeruginosa. Pseudomonas is sensitive to Cipro but not meropenem. Has already been started on cefepime/vancomycin. Meropenem has been ordered.  REVIEW OF SYSTEMS Constitutional: No weight loss. No night sweats. No fever. No chills. No fatigue. HEENT: No headaches, dysphagia, sore throat, otalgia, nasal congestion, PND CV:  No chest pain, orthopnea, PND, swelling in lower extremities, palpitations GI:  Abdominal pain. Denies nausea, vomiting, diarrhea, change in bowel pattern, anorexia Resp: No DOE, rest dyspnea, cough, mucus, hemoptysis, wheezing  GU: no dysuria, change in color of urine, no urgency or frequency.  No flank pain. MS:  No joint pain or swelling. No myalgias,  No decreased range of motion.  Psych:  No change in mood or affect. No memory loss. Skin: no rash or lesions.   PAST MEDICAL/SURGICAL/SOCIAL/FAMILY HISTORY  Past Medical History:  Diagnosis Date  . Cerebellar atrophy   . Chronic indwelling Foley catheter   . Chronic kidney disease (CKD), stage IV (severe) ([place])   . COPD (chronic obstructive pulmonary disease) ([place])   . Paraplegia ([place])   . Paroxysmal atrial fibrillation (HCC)   . PEG (percutaneous endoscopic gastrostomy) status ([place])   . Systolic heart failure ([place])   . Tracheostomy in place [facility])   . Unilateral AKA, right [facility])     Past Surgical History:  Procedure Laterality Date  . IR  [place] GASTRO/COLONIC TUBE PERCUT W/FLUORO  08/03/2017    Social History   Tobacco Use  . Smoking status: Never  Smoker  . Smokeless tobacco: Never Used  Substance Use Topics  . Alcohol use: No    Frequency: Never    Family History  Family history unknown: Yes    Prior to Admission medications   Medication Sig Start Date End Date Taking? Authorizing Provider  acetaminophen (TYLENOL) 325 MG tablet Place 650 mg into feeding tube every 6 (six) hours as needed for mild pain (temp greater than than 100.6).    Yes [provider]  Amino Acids-Protein Hydrolys (FEEDING SUPPLEMENT, PRO-STAT SUGAR FREE 64,) LIQD Place 30 mLs into feeding tube 2 (two) times daily. 08/20/17  Yes Allie Bossier, MD  atorvastatin (LIPITOR) 10 MG tablet Place 1 tablet (10 mg total) into feeding tube at bedtime. 08/20/17  Yes Allie Bossier, MD  baclofen 5 MG TABS Place 5 mg into feeding tube 2 (two) times daily. 08/20/17  Yes Allie Bossier, MD  chlorhexidine gluconate, MEDLINE KIT, (PERIDEX) 0.12 % solution 15 mLs by Mouth Rinse route 2 (two) times daily. 08/20/17  Yes Allie Bossier, MD  collagenase (SANTYL) ointment Apply topically daily. Apply Santyl to right buttock wound Q day, then cover with moist 2X2 and foam dressing.  (Change foam dressing Q 3 days or PRN soiling.) 08/21/17  Yes Allie Bossier, MD  Darbepoetin Alfa 25 MCG/ML SOLN Inject 25 mcg as directed every 28 (twenty-eight) days.    Yes [provider]  diltiazem (CARDIZEM) 10 mg/ml oral suspension Place 3 mLs (30 mg total) into feeding tube every 6 (six) hours. 08/20/17  Yes Allie Bossier, MD  fluticasone (FLONASE) 50 MCG/ACT nasal spray Place 2 sprays into both nostrils daily.    Yes [provider]  glycopyrrolate (ROBINUL) 1 MG tablet Place 1 tablet (1 mg total) into feeding tube 2 (two) times daily. 08/20/17  Yes Allie Bossier, MD  ipratropium-albuterol (DUONEB) 0.5-2.5 (3) MG/3ML SOLN Take 3 mLs by nebulization 3 (three) times daily. 08/20/17  Yes Allie Bossier, MD  levothyroxine (SYNTHROID, LEVOTHROID) 125 MCG tablet Place  1 tablet (125 mcg total) into feeding tube daily before breakfast. 08/21/17  Yes Allie Bossier, MD  loratadine (CLARITIN) 10 MG tablet Place 10 mg into feeding tube daily.    Yes [provider]  meropenem (MERREM) IVPB Inject 1 g into the vein every 12 (twelve) hours. For 10 days- started on 09-16-17   Yes [provider]  metoprolol tartrate (LOPRESSOR) 25 MG tablet Place 0.5 tablets (12.5 mg total) into feeding tube 2 (two) times daily. 01/25/17  Yes Chesley Mires, MD  montelukast (SINGULAIR) 10 MG tablet Place 10 mg into feeding tube at bedtime.    Yes [provider]  Multiple Vitamin (MULTIVITAMIN WITH MINERALS) TABS tablet Place 1 tablet into feeding tube daily.    Yes [provider]  Nutritional Supplements (FEEDING SUPPLEMENT, VITAL AF 1.2 CAL,) LIQD Place 1,000 mLs into feeding tube continuous. 08/20/17  Yes Allie Bossier, MD  pantoprazole sodium (PROTONIX) 40 mg/20 mL PACK Place 20 mLs (40 mg total) into feeding tube daily. 08/20/17  Yes Allie Bossier, MD  simethicone (MYLICON) 40 VU/0.2BX SUSP Place 0.6 mLs (40 mg total) into feeding tube every 6 (six) hours as needed for flatulence. 08/20/17  Yes Allie Bossier, MD  Vitamin D, Ergocalciferol, (DRISDOL) 50000 units CAPS capsule Place 50,000 Units into feeding tube every  Friday.    Yes [provider]  Water For Irrigation, Sterile (FREE WATER) SOLN Place 200 mLs into feeding tube every 8 (eight) hours. 08/20/17  Yes Allie Bossier, MD  enoxaparin (LOVENOX) 80 MG/0.8ML injection Inject 0.75 mLs (75 mg total) into the skin every 12 (twelve) hours. Patient not taking: Reported on 09/18/2017 08/20/17   Allie Bossier, MD  fentaNYL (SUBLIMAZE) 100 MCG/2ML injection Inject 0.5-1 mLs (25-50 mcg total) into the vein every 2 (two) hours as needed for severe pain. Patient not taking: Reported on 09/18/2017 08/20/17   Allie Bossier, MD  linezolid (ZYVOX) 600 MG/300ML IVPB Inject 300 mLs (600 mg  total) into the vein every 12 (twelve) hours. Patient not taking: Reported on 09/18/2017 08/20/17   Allie Bossier, MD  warfarin (COUMADIN) 10 MG tablet Take 1 tablet (10 mg total) by mouth daily at 6 PM. Patient not taking: Reported on 09/18/2017 08/21/17   Allie Bossier, MD     No Known Allergies    Current Facility-Administered Medications:  .  0.9 % NaCl with KCl 20 mEq/ L  infusion, , Intravenous, Continuous, Lady Deutscher, MD, Last Rate: 75 mL/hr at 09/19/17 0910, 1,000 mL at 09/19/17 0910 .  acetaminophen (TYLENOL) tablet 650 mg, 650 mg, Per Tube, Q6H PRN, Lady Deutscher, MD, 650 mg at 09/19/17 0532 .  atorvastatin (LIPITOR) tablet 10 mg, 10 mg, Per Tube, QHS, Lady Deutscher, MD, 10 mg at 09/18/17 2222 .  baclofen (LIORESAL) tablet 5 mg, 5 mg, Per Tube, BID, Lady Deutscher, MD, 5 mg at 09/19/17 1000 .  chlorhexidine gluconate (MEDLINE KIT) (PERIDEX) 0.12 % solution 15 mL, 15 mL, Mouth Rinse, BID, Evangeline Gula Wyatt Haste, MD, 15 mL at 09/19/17 0813 .  ciprofloxacin (CIPRO) 500 MG/5ML (10%) suspension 750 mg, 750 mg, Oral, BID, Michel Bickers, MD, 750 mg at 09/19/17 1017 .  collagenase (SANTYL) ointment, , Topical, Daily, Dessa Phi, DO .  [START ON 09/20/2017] Darbepoetin Alfa (ARANESP) injection 25 mcg, 25 mcg, Subcutaneous, Q28 days, Lady Deutscher, MD .  diltiazem (CARDIZEM) 10 mg/ml oral suspension 30 mg, 30 mg, Per Tube, Q6H, Lady Deutscher, MD, 30 mg at 09/19/17 1250 .  feeding supplement (VITAL AF 1.2 CAL) liquid 1,000 mL, 1,000 mL, Per Tube, Continuous, Lady Deutscher, MD, Last Rate: 70 mL/hr at 09/19/17 1318, 1,000 mL at 09/19/17 1318 .  fluticasone (FLONASE) 50 MCG/ACT nasal spray 2 spray, 2 spray, Each Nare, Daily, Lady Deutscher, MD, 2 spray at 09/19/17 1004 .  free water 200 mL, 200 mL, Per Tube, Q8H, Lady Deutscher, MD, 200 mL at 09/19/17 0534 .  glycopyrrolate (ROBINUL) tablet 1 mg, 1 mg, Per Tube, BID, Lady Deutscher, MD, 1 mg at  09/19/17 0958 .  ipratropium-albuterol (DUONEB) 0.5-2.5 (3) MG/3ML nebulizer solution 3 mL, 3 mL, Nebulization, TID, Evangeline Gula Wyatt Haste, MD, 3 mL at 09/19/17 0744 .  levothyroxine (SYNTHROID, LEVOTHROID) tablet 125 mcg, 125 mcg, Per Tube, QAC breakfast, Lady Deutscher, MD, 125 mcg at 09/19/17 0957 .  meropenem (MERREM) 1 g in sodium chloride 0.9 % 100 mL IVPB, 1 g, Intravenous, Q8H, Ivor Costa, MD, Last Rate: 200 mL/hr at 09/19/17 0532, 1 g at 09/19/17 0532 .  metoprolol tartrate (LOPRESSOR) tablet 12.5 mg, 12.5 mg, Per Tube, BID, Lady Deutscher, MD, 12.5 mg at 09/19/17 0958 .  montelukast (SINGULAIR) tablet 10 mg, 10 mg, Per Tube, QHS, Lady Deutscher, MD, 10 mg at 09/18/17 2223 .  multivitamin with minerals tablet 1 tablet, 1 tablet, Per Tube, Daily, Lady Deutscher, MD, 1 tablet at 09/19/17 [phone] .  ondansetron (ZOFRAN) tablet 4 mg, 4 mg, Oral, Q6H PRN **OR** ondansetron (ZOFRAN) injection 4 mg, 4 mg, Intravenous, Q6H PRN, Lady Deutscher, MD .  oxyCODONE (Oxy IR/ROXICODONE) immediate release tablet 5 mg, 5 mg, Per J Tube, Q4H PRN, Lady Deutscher, MD .  pantoprazole sodium (PROTONIX) 40 mg/20 mL oral suspension 40 mg, 40 mg, Per Tube, Daily, Lady Deutscher, MD, 40 mg at 09/19/17 0956 .  polyethylene glycol (MIRALAX / GLYCOLAX) packet 17 g, 17 g, Oral, Daily, Evangeline Gula, Wyatt Haste, MD .  simethicone (MYLICON) 40 WC/3.7SE suspension 40 mg, 40 mg, Per Tube, Q6H PRN, Lady Deutscher, MD .  sodium chloride flush (NS) 0.9 % injection 3 mL, 3 mL, Intravenous, Q12H, Lady Deutscher, MD, 3 mL at 09/19/17 0957 .  [START ON 09/23/2017] Vitamin D (Ergocalciferol) (DRISDOL) capsule 50,000 Units, 50,000 Units, Per Tube, Q Osvaldo Shipper, MD   OBJECTIVE:   VITAL SIGNS: BP 112/68   Pulse 83   Temp (!) 101.1 F (38.4 C) (Oral)   Resp (!) 25   Ht 5' 10" (1.778 m)   Wt 80 kg (176 lb 5.9 oz)   SpO2 99%   BMI 25.31 kg/m   Vitals:   09/19/17 0958 09/19/17 1110 09/19/17  1140 09/19/17 1150  BP: 113/76 112/68 (P) 110/68 112/68  Pulse: (!) 109 89 (!) 103 83  Resp:  (!) 22 18 (!) 25  Temp:   (P) 99.7 F (37.6 C) (!) 101.1 F (38.4 C)  TempSrc:   (P) Oral Oral  SpO2:  99% 100% 99%  Weight:      Height:        HEMODYNAMICS:    VENTILATOR SETTINGS: Vent Mode: PRVC FiO2 (%):  [40 %] (P) 40 % Set Rate:  [16 bmp] 16 bmp Vt Set:  [500 mL] 500 mL PEEP:  [5 cmH20] 5 cmH20 Plateau Pressure:  [18 cmH20-22 cmH20] 20 cmH20  INTAKE / OUTPUT: I/O last 3 completed shifts: In: 3411.4 [I.V.:816.4; Blood:315; Other:120; NG/GT:1310; IV Piggyback:850] Out: 2600 [Urine:2600]  PHYSICAL EXAMINATION: General:  Pleasant. Well-developed. Alert, awake and oriented to time, person and place. Cooperative. No acute distress. Normal affect. Head: normocephalic, atraumatic EYE: PERRLA, EOM intact, no scleral icterus, no pallor Nose: nares are patent. No polyps. No exudate. No sinus tenderness. Throat/Oral Cavity: Normal dentition. No oral thrush. No exudate. Mucous membranes are moist. No tonsillar enlargement. Neck: supple, no thyromegaly, no JVD, no lymphadenopathy. Trachea midline. Chest/Lung: symmetric in development and expansion. Good air entry. No use of accessory muscles. No crackles. No wheezes. No dullness to percussion. Heart: Regular S1 and S2 without murmur, rub or gallop. Abdomen: soft, nontender, nondistended. Normoactive bowel sounds. n rebound. No guarding. No hepatosplenomegaly. Extremities: no pedal edema, no cyanosis, no clubbing. 2+ DP pulses Lymphatic: no cervical/axiallary/inguinal lymph nodes appreciated Musculoskeletal:  No deformities. Skin:  Warm. Dry. No rash or lesion. NEURO: cranial nerves II-XII are grossly symmetric and physiologic. Babinski absent. No sensory deficit. No motor deficit. DTR 2+ @ RUE, 2+ @ LUE 2+ @ RLL,  2+ @ LLL. No cerebellar signs. Gait was not assessed.   LABS:  BMET Recent Labs  Lab 09/18/17 0347 09/18/17 1309  09/19/17 0200  NA 139 142 143  K 4.5 4.4 4.3  CL 100* 101 104  CO2 _0 BUN 48* 41* 44*  CREATININE 0.75  0.81 0.87  GLUCOSE 91 76 110*    Electrolytes Recent Labs  Lab 09/18/17 0347 09/18/17 1309 09/19/17 0200 09/19/17 0758  CALCIUM 9.2 9.2 9.0  --   MG  --   --   --  2.1    CBC Recent Labs  Lab 09/18/17 0347 09/18/17 1309 09/18/17 1940 09/19/17 0200 09/19/17 0758  WBC 11.4* 11.6*  --  11.3*  --   HGB 7.7* 7.3* 7.0* 6.9* 8.0*  HCT 26.8* 25.7*  --  23.4*  --   PLT 262 264  --  242  --     Coag's Recent Labs  Lab 09/18/17 0347 09/18/17 1309 09/19/17 0200  APTT  --  50* 49*  INR 2.00 1.63 1.48    Sepsis Markers Recent Labs  Lab 09/18/17 0616 09/18/17 0855 09/18/17 1309  LATICACIDVEN 0.7 0.7 0.9  PROCALCITON  --   --  1.60    ABG Recent Labs  Lab 09/18/17 0415  PHART 7.361  PCO2ART 55.9*  PO2ART 65.0*    Liver Enzymes Recent Labs  Lab 09/18/17 0347 09/18/17 1309 09/19/17 0200  AST 197* 174* 152*  ALT 382* 352* 297*  ALKPHOS 1,094* 1,379* 1,573*  BILITOT 2.6* 2.6* 1.7*  ALBUMIN 2.2* 1.9* 1.8*    Cardiac Enzymes No results for input(s): TROPONINI, PROBNP in the last 168 hours.  Glucose No results for input(s): GLUCAP in the last 168 hours.  Imaging Portable Chest 1 View  Result Date: 09/19/2017 CLINICAL DATA:  Tracheostomy. EXAM: PORTABLE CHEST 1 VIEW COMPARISON:  09/18/2017. FINDINGS: Tracheostomy is midline. Right PICC tip projects over the SVC RA junction. Heart is enlarged, stable. Mild to moderate mixed interstitial and airspace opacification, similar. Small bilateral pleural effusions. IMPRESSION: Congestive heart failure. Electronically Signed   By: Lorin Picket M.D.   On: 09/19/2017 07:39    CULTURES: Results for orders placed or performed during the hospital encounter of 09/18/17  Urine Culture     Status: None   Collection Time: 09/18/17  3:37 AM  Result Value Ref Range Status   Specimen Description URINE,  RANDOM  Final   Special Requests NONE  Final   Culture NO GROWTH  Final   Report Status 09/19/2017 FINAL  Final  MRSA PCR Screening     Status: None   Collection Time: 09/18/17 12:20 PM  Result Value Ref Range Status   MRSA by PCR NEGATIVE NEGATIVE Final    Comment:        The GeneXpert MRSA Assay (FDA approved for NASAL specimens only), is one component of a comprehensive MRSA colonization surveillance program. It is not intended to diagnose MRSA infection nor to guide or monitor treatment for MRSA infections.     OTHER STUDIES:  -  ANTIBIOTICS: Meropenem Ciprofloxacin   SIGNIFICANT EVENTS: 1/13 >> admitted to 53M. Started antibiotic therapy for S marcescens, M catarrhalis, P aeruginosa   LINES/TUBES:   My assessment, plan of care, findings, medications, side effects, etc. were discussed with:  Nurse  Patient   Renee Pain, MD Board Certified by the ABIM, [place] Pager: [phone]  09/19/2017, 1:30 PM

## 2017-09-19 NOTE — Progress Notes (Signed)
Initial Nutrition Assessment  DOCUMENTATION CODES:   Not applicable  INTERVENTION:    D/C Pro-stat   Continue Vital AF 1.2 at goal rate of 70 ml/hr  Provides 2016 kcals, 126 gm protein, 1362 ml of free water daily  NUTRITION DIAGNOSIS:   Inadequate oral intake related to inability to eat, chronic illness as evidenced by NPO status  GOAL:   Patient will meet greater than or equal to 90% of their needs  MONITOR:   TF tolerance, Labs, Skin, Weight trends, I & O's  REASON FOR ASSESSMENT:   New TF, Low Braden  ASSESSMENT:   67 y.o. Male with medical history significant of paraplegia, tracheostomy, recent right AKA November 2018 due to severe peripheral arterial disease, CKD stage IV, COPD, admitted with complaints of blood-tinged urine in Foley catheter noticed this morning.   Patient is currently on ventilator support >> trach MV: 10.4 L/min Temp (24hrs), Avg:99.8 F (37.7 C), Min:98.7 F (37.1 C), Max:101.1 F (38.4 C)  Pt known to this RD from previous admission[facility]. Vital AF 1.2 formula currently infusing at 70 ml/hr via PEG tube.  Also receiving Pro-stat liquid protein 30 ml BID.  Medications include MVI, Vitamin D and levothyroxine. Free water flushes at 200 ml every 8 hours. Labs reviewed. CBG's [id number].  Low braden score places patient at risk for further skin breakdown.  NUTRITION - FOCUSED PHYSICAL EXAM:    Most Recent Value  Orbital Region  Moderate depletion  Upper Arm Region  No depletion  Thoracic and Lumbar Region  Unable to assess  Buccal Region  No depletion  Temple Region  No depletion  Clavicle Bone Region  Moderate depletion  Clavicle and Acromion Bone Region  No depletion  Scapular Bone Region  Unable to assess  Dorsal Hand  Unable to assess  Edema (RD Assessment)  None  Lower extremities N/A given paraplegia     Diet Order:  Diet NPO time specified  EDUCATION NEEDS:   No education needs have been identified  at this time  Skin:  Skin Assessment: Skin Integrity Issues: Skin Integrity Issues:: Stage IV, Other (Comment) Stage IV: R ischium Other: full thickness split to tip of penis  Last BM:  1/14   Intake/Output Summary (Last 24 hours) at 09/19/2017 1322 Last data filed at 09/19/2017 0957 Gross per 24 hour  Intake 3452.42 ml  Output 2000 ml  Net 1452.42 ml   Height:   Ht Readings from Last 1 Encounters:  09/18/17 5\' 10"  (1.778 m)    Weight:   Wt Readings from Last 1 Encounters:  09/19/17 176 lb 5.9 oz (80 kg)    Ideal Body Weight:  72.4 kg  BMI:  28.4 kg/m2 >> adjusted for AKA  Estimated Nutritional Needs:   Kcal:  2054  Protein:  120-135 gm  Fluid:  >/= 2.0 L  Maureen ChattersKatie Trevor Wilkie, RD, LDN Pager #: [phone] After-Hours Pager #: [phone]

## 2017-09-20 DIAGNOSIS — S78111A Complete traumatic amputation at level between right hip and knee, initial encounter: Secondary | ICD-10-CM

## 2017-09-20 DIAGNOSIS — G319 Degenerative disease of nervous system, unspecified: Secondary | ICD-10-CM | POA: Diagnosis not present

## 2017-09-20 DIAGNOSIS — A409 Streptococcal sepsis, unspecified: Secondary | ICD-10-CM | POA: Diagnosis not present

## 2017-09-20 DIAGNOSIS — Z930 Tracheostomy status: Secondary | ICD-10-CM | POA: Diagnosis not present

## 2017-09-20 DIAGNOSIS — J9621 Acute and chronic respiratory failure with hypoxia: Secondary | ICD-10-CM | POA: Diagnosis not present

## 2017-09-20 DIAGNOSIS — R0902 Hypoxemia: Secondary | ICD-10-CM | POA: Diagnosis not present

## 2017-09-20 DIAGNOSIS — J189 Pneumonia, unspecified organism: Secondary | ICD-10-CM | POA: Diagnosis not present

## 2017-09-20 DIAGNOSIS — D62 Acute posthemorrhagic anemia: Secondary | ICD-10-CM

## 2017-09-20 DIAGNOSIS — K7460 Unspecified cirrhosis of liver: Secondary | ICD-10-CM

## 2017-09-20 LAB — COMPREHENSIVE METABOLIC PANEL
ALK PHOS: 1794 U/L — AB (ref 38–126)
ALT: 273 U/L — ABNORMAL HIGH (ref 17–63)
ANION GAP: 11 (ref 5–15)
AST: 160 U/L — ABNORMAL HIGH (ref 15–41)
Albumin: 1.9 g/dL — ABNORMAL LOW (ref 3.5–5.0)
BUN: 38 mg/dL — ABNORMAL HIGH (ref 6–20)
CHLORIDE: 104 mmol/L (ref 101–111)
CO2: 31 mmol/L (ref 22–32)
Calcium: 8.8 mg/dL — ABNORMAL LOW (ref 8.9–10.3)
Creatinine, Ser: 0.81 mg/dL (ref 0.61–1.24)
GFR calc non Af Amer: >60 mL/min (ref >60)
GLUCOSE: 109 mg/dL — AB (ref 65–99)
Potassium: 4.5 mmol/L (ref 3.5–5.1)
SODIUM: 146 mmol/L — AB (ref 135–145)
Total Bilirubin: 1.5 mg/dL — ABNORMAL HIGH (ref 0.3–1.2)
Total Protein: 7.4 g/dL (ref 6.5–8.1)

## 2017-09-20 LAB — CBC
HCT: 27 % — ABNORMAL LOW (ref 39.0–52.0)
HEMOGLOBIN: 8.3 g/dL — AB (ref 13.0–17.0)
MCH: 24.6 pg — AB (ref 26.0–34.0)
MCHC: 30.7 g/dL (ref 30.0–36.0)
MCV: 80.1 fL (ref 78.0–100.0)
PLATELETS: 239 10*3/uL (ref 150–400)
RBC: 3.37 MIL/uL — AB (ref 4.22–5.81)
RDW: 17.6 % — ABNORMAL HIGH (ref 11.5–15.5)
WBC: 12.7 10*3/uL — ABNORMAL HIGH (ref 4.0–10.5)

## 2017-09-20 LAB — BPAM RBC
Blood Product Expiration Date: 201901192359
Blood Product Expiration Date: 201901192359
ISSUE DATE / TIME: 201901140805
ISSUE DATE / TIME: 201901140402
UNIT TYPE AND RH: 5100
Unit Type and Rh: 5100

## 2017-09-20 LAB — TYPE AND SCREEN
ABO/RH(D): O POS
Antibody Screen: NEGATIVE
UNIT DIVISION: 0
Unit division: 0

## 2017-09-20 LAB — GLUCOSE, CAPILLARY
GLUCOSE-CAPILLARY: 99 mg/dL (ref 65–99)
Glucose-Capillary: 97 mg/dL (ref 65–99)

## 2017-09-20 LAB — PROTIME-INR
INR: 1.23
Prothrombin Time: 15.4 seconds — ABNORMAL HIGH (ref 11.4–15.2)

## 2017-09-20 MED ORDER — SALINE SPRAY 0.65 % NA SOLN
1.0000 | NASAL | Status: DC | PRN
  Administered 2017-09-20: 1 via NASAL
  Filled 2017-09-20: qty 44

## 2017-09-20 MED ORDER — CIPROFLOXACIN IN D5W 400 MG/200ML IV SOLN
400.0000 mg | Freq: Two times a day (BID) | INTRAVENOUS | Status: DC
  Administered 2017-09-20: 400 mg via INTRAVENOUS
  Filled 2017-09-20: qty 200

## 2017-09-20 MED ORDER — CIPROFLOXACIN IN D5W 400 MG/200ML IV SOLN
400.0000 mg | Freq: Three times a day (TID) | INTRAVENOUS | Status: DC
  Administered 2017-09-20 – 2017-09-21 (×3): 400 mg via INTRAVENOUS
  Filled 2017-09-20 (×3): qty 200

## 2017-09-20 NOTE — Progress Notes (Signed)
[facility] Pulmonary Diseases & Critical Care Medicine Progress Note  Patient Name: Craig Oneal. MRN: [id number] DOB: June 16, 1951    ADMISSION DATE:  09/18/2017 CONSULTATION DATE:  09/18/2017  REFERRING MD:  Dr. 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  Trach collar trial. This patient should only need mechanical ventilatory support at night.  Speech therapy consult (Passy-Muir valve)   NUTRITION: Vital AF 1.2 as tolerated   DVT PROPHYLAXIS: SCDs. Chemoprophylaxis on hold due to hematuria at the time of presentation.  GI PROPHYLAXIS: Protonix    SUBJECTIVE:   -Interim events: on meropenem/ciprofloxacin.  HISTORY OF PRESENT ILLNESS This 67 y.o. African American  male is seen in consultation at the request of Dr. Brett Canales for recommendations on further evaluation and management of chronic respiratory failure. He was transferred from [facility] with complaints of blood-tinged urine in Foley catheter noticed  this morning.He is on Coumadin for atrial fibrillation, which has already been stopped with additional administration of vitamin K 5 mg.  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 He has chronic respiratory failure and requires nocturnal ventilator use. He underwent tracheostomy and PEG placement in 07/2016. He has a chronic indwelling Foley.  Trach aspirate isolated S marcescens, M catarrhalis (beta lactamase-positive) and carbapenem resistant-P aeruginosa. Pseudomonas is sensitive to Cipro but not meropenem. Has already been started on cefepime/vancomycin. Meropenem has been ordered.        OBJECTIVE:   VITAL SIGNS: BP (!) 108/58 (BP Location: Left Arm)   Pulse 93   Temp 98.8 F (37.1 C) (Oral)   Resp 18   Ht 5\' 10"  (1.778 m)   Wt 80.5 kg (177 lb 7.5 oz)   SpO2 100%   BMI 25.46 kg/m  Vitals:   09/20/17 0345 09/20/17 0400 09/20/17 0800 09/20/17 0925  BP:  121/73 (!) 108/58   Pulse:  (!) 59 93   Resp:  20 18   Temp: 98 F (36.7 C)  98.8 F (37.1 C)   TempSrc: Oral  Oral   SpO2: 99% 100% 98% 100%  Weight:      Height:        HEMODYNAMICS:    VENTILATOR SETTINGS: Vent Mode: PRVC FiO2 (%):  [40 %] 40 % Set Rate:  [16 bmp] 16 bmp Vt Set:  [500 mL] 500 mL PEEP:  [5 cmH20] 5 cmH20 Plateau Pressure:  [20 cmH20-25 cmH20] 20 cmH20  INTAKE / OUTPUT: I/O last 3 completed shifts: In: 3499.4 [I.V.:1069.4; Blood:663; Other:120; WU/JW:1191; IV Piggyback:400] Out: 3500 [Urine:3500]  PHYSICAL EXAMINATION: General: Appears older than stated age of 67  HEENT: Trach in place currently on full ventilatory support PSY: Dull effect Neuro: Follows intermittent commands CV: Atrial fib, periods of rapid ventricular response. PULM: Currently full mechanical ventilatory support, overbreathing the vent at a rate of 20, coarse rhonchi bilaterally GI: Soft nontender Extremities: warm/dry, 1+ edema  Skin: Documented sacral  decubitus  LABS:  BMET Recent Labs  Lab 09/18/17 1309 09/19/17 0200 09/20/17 0225  NA 142 143 146*  K 4.4 4.3 4.5  CL 101 104 104  CO2 29 31 31   BUN 41* 44* 38*  CREATININE 0.81 0.87 0.81  GLUCOSE 76 110* 109*    Electrolytes Recent Labs  Lab 09/18/17 1309 09/19/17 0200 09/19/17 0758 09/20/17 0225  CALCIUM 9.2 9.0  --  8.8*  MG  --   --  2.1  --     CBC Recent Labs  Lab 09/18/17 1309  09/19/17 0200  09/19/17 1400 09/19/17 2332 09/20/17 0225  WBC 11.6*  --  11.3*  --   --   --  12.7*  HGB 7.3*   < > 6.9*   < > 8.0* 8.0* 8.3*  HCT 25.7*  --  23.4*  --  26.6*  --  27.0*  PLT 264  --  242  --   --   --  239   < > = values in this interval not displayed.    Coag's Recent Labs  Lab 09/18/17 1309 09/19/17 0200 09/20/17 0225  APTT 50* 49*  --   INR 1.63 1.48 1.23    Sepsis Markers Recent Labs  Lab 09/18/17 0616 09/18/17 0855 09/18/17 1309  LATICACIDVEN 0.7 0.7 0.9  PROCALCITON  --   --  1.60    ABG Recent Labs  Lab 09/18/17 0415  PHART 7.361  PCO2ART 55.9*  PO2ART 65.0*    Liver Enzymes Recent Labs  Lab 09/18/17 1309 09/19/17 0200 09/20/17 0225  AST 174* 152* 160*  ALT 352* 297* 273*  ALKPHOS 1,379* 1,573* 1,794*  BILITOT 2.6* 1.7* 1.5*  ALBUMIN 1.9* 1.8* 1.9*    Cardiac Enzymes No results for input(s): TROPONINI, PROBNP in the last 168 hours.  Glucose No results for input(s): GLUCAP in the last 168 hours.  Imaging Dg Chest Port 1 View  Result Date: 09/20/2017 CLINICAL DATA:  PICC placement EXAM: PORTABLE CHEST 1 VIEW COMPARISON:  Chest radiograph from earlier today. FINDINGS: Right PICC terminates at the cavoatrial junction. Tracheostomy tube terminates over the tracheal air column at the thoracic inlet. Stable cardiomediastinal silhouette with mild cardiomegaly. No pneumothorax. Stable small left pleural effusion. Stable trace right pleural effusion. Stable mild pulmonary edema and left basilar lung opacity. IMPRESSION: 1.  Right PICC terminates at the cavoatrial junction. 2. Stable mild congestive heart failure. 3. Stable small left pleural effusion. 4. Stable left basilar lung opacity, probably atelectasis. Electronically Signed   By: Delbert PhenixJason A Poff M.D.   On: 09/20/2017 00:51    CULTURES: Results for orders placed or performed during the hospital encounter of 09/18/17  Urine Culture     Status: None   Collection Time: 09/18/17  3:37 AM  Result Value Ref Range Status   Specimen Description URINE, RANDOM  Final   Special Requests NONE  Final   Culture NO GROWTH  Final   Report Status 09/19/2017 FINAL  Final  Blood culture (routine x 2)     Status: None (Preliminary result)   Collection Time: 09/18/17  3:58 AM  Result Value Ref Range Status   Specimen Description BLOOD RIGHT HAND  Final   Special Requests   Final    BOTTLES DRAWN AEROBIC AND ANAEROBIC Blood Culture adequate volume   Culture NO GROWTH 1 DAY  Final   Report Status PENDING  Incomplete  Blood culture (routine x 2)     Status: None (Preliminary result)   Collection Time: 09/18/17  4:03 AM  Result Value Ref Range Status   Specimen Description BLOOD LEFT HAND  Final   Special Requests   Final    BOTTLES DRAWN AEROBIC AND ANAEROBIC Blood Culture adequate volume   Culture NO GROWTH 1 DAY  Final   Report Status PENDING  Incomplete  MRSA PCR Screening     Status: None   Collection Time: 09/18/17 12:20 PM  Result Value Ref Range Status   MRSA by PCR NEGATIVE NEGATIVE Final    Comment:        The GeneXpert MRSA Assay (FDA approved for NASAL specimens only), is one component of a comprehensive MRSA colonization surveillance program. It is not intended to diagnose MRSA infection nor to guide or monitor treatment for MRSA infections.   Culture, respiratory (NON-Expectorated)     Status: None (Preliminary result)   Collection Time: 09/19/17  2:11 PM  Result Value Ref Range Status   Specimen Description TRACHEAL ASPIRATE  Final   Special  Requests NONE  Final   Gram Stain   Final    ABUNDANT WBC PRESENT,BOTH PMN AND MONONUCLEAR ABUNDANT GRAM NEGATIVE RODS MODERATE SQUAMOUS EPITHELIAL CELLS PRESENT    Culture TOO YOUNG TO READ  Final   Report Status PENDING  Incomplete    OTHER STUDIES:  -  ANTIBIOTICS: Meropenem 09/18/2017 >> ciprofloxacin 09/20/2017>>   SIGNIFICANT EVENTS: 1/13 >> admitted to 84M. Started antibiotic therapy for S marcescens, M catarrhalis, P aeruginosa   LINES/TUBES: Chronic indwelling trach PEG    Brett Canales Minor ACNP Adolph Pollack PCCM Pager [phone] till 1 pm If no answer page [phone] 09/20/2017, 11:06 AM  Attending Note:  67 year old male with acute on chronic respiratory failure due to sepsis that has not been tolerating TC.  Attempted TC this AM and tolerated so far.  On exam, on TC, awake and interactive.  I reviewed CXR myself, trach is in good position.  Discussed with PCCM-NP.  Acute on chronic respiratory failure:  - Continue weaning as ordered, TC during the day then PS at night with TC again in AM.  Hypoxemia:  - Titrate O2 for sat of 88-92%  - Goal is FiO2 of 28%  Trach stat:  - Maintain cuffed trach  - Maintain current size and type  Patient seen and examined, agree with above note.  I dictated the care and orders written for this patient under my direction.  Alyson Reedy, MD [phone]

## 2017-09-20 NOTE — Progress Notes (Signed)
PROGRESS NOTE    Craig J Durel Salts.  OIB:[id number] DOB: 21-Apr-1951 DOA: 09/18/2017 PCP: System, Pcp Not In     Brief Narrative:  Craig Kirschner. is a 67 y.o. 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 Review of records from the nursing facility shows that on January 11 he grew out a multidrug resistant E. coli sensitive to meropenem.  He also had a sputum culture positive for Serratia marcescens and Moraxella catarrhalis with heavy growth.  He also had light growth of Pseudomonas which may be a colonizer for him. Patient was admitted for further treatment.   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 ([place])   Tracheostomy status (HCC)   Acute on chronic respiratory failure with hypoxemia (HCC)   Ventilator dependence ([place])   Bilateral hydronephrosis   Chronic indwelling Foley catheter   Acute posthemorrhagic anemia   E-coli UTI   Pneumonia due to Serratia marcescens (HCC)   Moraxella catarrhalis pneumonia (HCC)   Other cirrhosis of liver (HCC)   Chronic bilateral pleural effusions   Gastrostomy tube dependent (HCC)   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)   Cerebellar atrophy  Sepsis secondary  to  CAUTI, POA, as well as HCAP   -Culture and sensitivity report from [employer], in paper chart  -ID consulted -Continue merrem/cipro , recent sputum cxs have shown    Serratia, Moraxella and Pseudomonas   Meropenem will cover Serratia isolate ,  cipro added to cover Pseudomonas   Day 3 of meropenem day 2 of ciprofloxacin    Repeat sputum cx  pending   Also has a hx of VRE  UTI  Acute on chronic hypoxemic respiratory failure  -Patient with chronic respiratory failure requiring ventilator at night, trach collar during the day -Continue vent support, PCCM following  -CXR with fluid overload, continue gentle diuresis with IV Lasix  Acute on chronic systolic CHF  -Recent echo 07/2017 showed EF 40-45%, global hypokinesis -Continue IV lasix , blood pressure soft and monitor renal function -Daily weight, strict I/Os   Decubitus ulcer of right ischial area stage IV POA -Wound RN consulted   Chronic atrial fibrillation  -Currently rate controlled, continue diltiazem, lopressor  -Hold coumadin for now due to anemia/cirrhosis , status post receiving vitamin K  Acute posthemorrhagic anemia on chronic anemia of chronic disease -Baseline Hgb around 8  -Patient with significant hematuria due to catheter balloon being in his ureter.  Will continue to hold his anticoagulation. Transfused 2u pRBC  1/14, hemoglobin improved from 6.9> 8.3  Cerebellar atrophy and paraplegia, neurogenic bladder, dysphagia  -Bedbound, on trach-vent, PEG, chronic foley   Cirrhosis of the liver with elevated liver enzymes -No known history of liver disease, although he has had chronically elevated alk phos as high as 255 in 2018 as well as AST  59. Now presenting with Alk phos 1094>1794, AST 197>160 , ALT 382>273.  ?Etiology secondary to hepatic injury in setting of sepsis -Abdominal US revealed gallstones and sludge with small amount pericholecystic fluid, as well as cirrhosis hepatitis panel pending, GGT.  Platelets are within normal limit 264  -Hold lipitor and tylenol  -Trend LFT   Hypothyroidism -Continue synthroid   PAD s/p AKA of RLE -Hold coumadin in setting of anemia   Full thickness split of penis -Noted last admission 12/13, outpatient urology with Dr. Gloriann Loan planned    DVT prophylaxis: SCD Code Status: Full Family Communication: No family at bedside Disposition Plan: Pending improvement   Consultants:   ID   Procedures:   None   Antimicrobials:  Anti-infectives (From admission, onward)   Start     Dose/Rate Route Frequency Ordered Stop   09/20/17 0830  ciprofloxacin (CIPRO) IVPB 400 mg     400 mg 200 mL/hr over 60 Minutes Intravenous Every 12 hours 09/20/17 0744     09/19/17 1000  ciprofloxacin (CIPRO) 500 MG/5ML (10%) suspension 750 mg  Status:  Discontinued     750 mg Oral 2 times daily 09/19/17 0904 09/20/17 0744   09/19/17 0900  ciprofloxacin (CIPRO) 500 MG/5ML (10%) suspension 500 mg  Status:  Discontinued     500 mg Oral 2 times daily 09/19/17 0857 09/19/17 0904   09/19/17 0600  vancomycin (VANCOCIN) IVPB 1000 mg/200 mL premix  Status:  Discontinued     1,000 mg 200 mL/hr over 60 Minutes Intravenous Every 24 hours 09/18/17 0503 09/18/17 0855   09/19/17 0500  ceFEPIme (MAXIPIME) 2 g in dextrose 5 % 50 mL IVPB  Status:  Discontinued     2 g 100 mL/hr over 30 Minutes Intravenous Every 24 hours 09/18/17 0503 09/18/17 0607   09/18/17 1400  meropenem (MERREM) 1 g in sodium chloride 0.9 % 100 mL IVPB     1 g 200 mL/hr over 30 Minutes Intravenous Every 8 hours 09/18/17 0627     09/18/17 0415  ceFEPIme (MAXIPIME) 2 g in dextrose 5 % 50 mL IVPB     2 g 100 mL/hr over 30 Minutes Intravenous  Once 09/18/17 0405 09/18/17 0443   09/18/17 0415  vancomycin (VANCOCIN) IVPB 1000 mg/200 mL premix  Status:  Discontinued     1,000 mg 200 mL/hr over 60 Minutes Intravenous  Once 09/18/17 0405 09/18/17 0411   09/18/17 0415  vancomycin (VANCOCIN) 1,500 mg in sodium chloride  0.9 % 500 mL IVPB     1,500 mg 250 mL/hr over 120 Minutes Intravenous  Once 09/18/17 0411 09/18/17 0646       Subjective: Patient able to questions by nodding his head, denies any pain, nausea Remains on the ventilator  Objective: Vitals:   09/20/17 0300 09/20/17 0345 09/20/17 0400 09/20/17 0800  BP: 107/66  121/73 (!) 108/58  Pulse: (!) 119  (!) 59 93  Resp: _0 Temp:  98 F (36.7 C)  98.8 F (37.1 C)  TempSrc:  Oral  Oral  SpO2: 97% 99% 100% 98%  Weight:      Height:        Intake/Output Summary (Last 24 hours) at 09/20/2017 0838 Last data filed at 09/20/2017 0200 Gross per 24 hour  Intake 463 ml  Output 1500 ml  Net -1037 ml   Filed Weights   09/18/17 1215 09/19/17 0343 09/20/17 0200  Weight: 77.9 kg (171 lb 11.8 oz) 80 kg (176 lb 5.9 oz) 80.5  kg (177 lb 7.5 oz)    Examination:  General exam: Appears calm, on trach-vent  Respiratory system: On trach-vent  Cardiovascular system: S1 & S2 heard. No JVD, murmurs, rubs, gallops or clicks. No pedal edema. Gastrointestinal system: Abdomen is nondistended, soft and nontender. Normal bowel sounds heard. +PEG  Central nervous system: Alert, able to follow simple commands but remains paraplegic with minimal movement of left hand  Extremities: Right AKA   Data Reviewed: I have personally reviewed following labs and imaging studies  CBC: Recent Labs  Lab 09/18/17 0347 09/18/17 1309  09/19/17 0200 09/19/17 0758 09/19/17 1400 09/19/17 2332 09/20/17 0225  WBC 11.4* 11.6*  --  11.3*  --   --   --  12.7*  NEUTROABS 9.1* 9.0*  --   --   --   --   --   --   HGB 7.7* 7.3*   < > 6.9* 8.0* 8.0* 8.0* 8.3*  HCT 26.8* 25.7*  --  23.4*  --  26.6*  --  27.0*  MCV 80.7 80.3  --  79.9  --   --   --  80.1  PLT 262 264  --  242  --   --   --  239   < > = values in this interval not displayed.   Basic Metabolic Panel: Recent Labs  Lab 09/18/17 0347 09/18/17 1309 09/19/17 0200 09/19/17 0758 09/20/17 0225  NA 139 142  143  --  146*  K 4.5 4.4 4.3  --  4.5  CL 100* 101 104  --  104  CO2 _0 --  31  GLUCOSE 91 76 110*  --  109*  BUN 48* 41* 44*  --  38*  CREATININE 0.75 0.81 0.87  --  0.81  CALCIUM 9.2 9.2 9.0  --  8.8*  MG  --   --   --  2.1  --    GFR: Estimated Creatinine Clearance: 95.1 mL/min (by C-G formula based on SCr of 0.81 mg/dL). Liver Function Tests: Recent Labs  Lab 09/18/17 0347 09/18/17 1309 09/19/17 0200 09/20/17 0225  AST 197* 174* 152* 160*  ALT 382* 352* 297* 273*  ALKPHOS 1,094* 1,379* 1,573* 1,794*  BILITOT 2.6* 2.6* 1.7* 1.5*  PROT 8.0 8.0 7.1 7.4  ALBUMIN 2.2* 1.9* 1.8* 1.9*   No results for input(s): LIPASE, AMYLASE in the last 168 hours. No results for input(s): AMMONIA in the last 168 hours. Coagulation Profile: Recent Labs  Lab 09/18/17 0347 09/18/17 1309 09/19/17 0200 09/20/17 0225  INR 2.00 1.63 1.48 1.23   Cardiac Enzymes: No results for input(s): CKTOTAL, CKMB, CKMBINDEX, TROPONINI in the last 168 hours. BNP (last 3 results) No results for input(s): PROBNP in the last 8760 hours. HbA1C: No results for input(s): HGBA1C in the last 72 hours. CBG: No results for input(s): GLUCAP in the last 168 hours. Lipid Profile: No results for input(s): CHOL, HDL, LDLCALC, TRIG, CHOLHDL, LDLDIRECT in the last 72 hours. Thyroid Function Tests: No results for input(s): TSH, T4TOTAL, FREET4, T3FREE, THYROIDAB in the last 72 hours. Anemia Panel: No results for input(s): VITAMINB12, FOLATE, FERRITIN, TIBC, IRON, RETICCTPCT in the last 72 hours. Sepsis Labs: Recent Labs  Lab 09/18/17 0414 09/18/17 0616 09/18/17 0855 09/18/17 1309  PROCALCITON  --   --   --  1.60  LATICACIDVEN 0.62 0.7 0.7 0.9    Recent Results (from the past 240 hour(s))  Urine Culture     Status: None   Collection  Time: 09/18/17  3:37 AM  Result Value Ref Range Status   Specimen Description URINE, RANDOM  Final   Special Requests NONE  Final   Culture NO GROWTH  Final   Report  Status 09/19/2017 FINAL  Final  Blood culture (routine x 2)     Status: None (Preliminary result)   Collection Time: 09/18/17  3:58 AM  Result Value Ref Range Status   Specimen Description BLOOD RIGHT HAND  Final   Special Requests   Final    BOTTLES DRAWN AEROBIC AND ANAEROBIC Blood Culture adequate volume   Culture NO GROWTH 1 DAY  Final   Report Status PENDING  Incomplete  Blood culture (routine x 2)     Status: None (Preliminary result)   Collection Time: 09/18/17  4:03 AM  Result Value Ref Range Status   Specimen Description BLOOD LEFT HAND  Final   Special Requests   Final    BOTTLES DRAWN AEROBIC AND ANAEROBIC Blood Culture adequate volume   Culture NO GROWTH 1 DAY  Final   Report Status PENDING  Incomplete  MRSA PCR Screening     Status: None   Collection Time: 09/18/17 12:20 PM  Result Value Ref Range Status   MRSA by PCR NEGATIVE NEGATIVE Final    Comment:        The GeneXpert MRSA Assay (FDA approved for NASAL specimens only), is one component of a comprehensive MRSA colonization surveillance program. It is not intended to diagnose MRSA infection nor to guide or monitor treatment for MRSA infections.   Culture, respiratory (NON-Expectorated)     Status: None (Preliminary result)   Collection Time: 09/19/17  2:11 PM  Result Value Ref Range Status   Specimen Description TRACHEAL ASPIRATE  Final   Special Requests NONE  Final   Gram Stain   Final    ABUNDANT WBC PRESENT,BOTH PMN AND MONONUCLEAR ABUNDANT GRAM NEGATIVE RODS MODERATE SQUAMOUS EPITHELIAL CELLS PRESENT    Culture TOO YOUNG TO READ  Final   Report Status PENDING  Incomplete       Radiology Studies: Dg Chest Port 1 View  Result Date: 09/20/2017 CLINICAL DATA:  PICC placement EXAM: PORTABLE CHEST 1 VIEW COMPARISON:  Chest radiograph from earlier today. FINDINGS: Right PICC terminates at the cavoatrial junction. Tracheostomy tube terminates over the tracheal air column at the thoracic inlet. Stable  cardiomediastinal silhouette with mild cardiomegaly. No pneumothorax. Stable small left pleural effusion. Stable trace right pleural effusion. Stable mild pulmonary edema and left basilar lung opacity. IMPRESSION: 1. Right PICC terminates at the cavoatrial junction. 2. Stable mild congestive heart failure. 3. Stable small left pleural effusion. 4. Stable left basilar lung opacity, probably atelectasis. Electronically Signed   By: Ilona Sorrel M.D.   On: 09/20/2017 00:51   Portable Chest 1 View  Result Date: 09/19/2017 CLINICAL DATA:  Tracheostomy. EXAM: PORTABLE CHEST 1 VIEW COMPARISON:  09/18/2017. FINDINGS: Tracheostomy is midline. Right PICC tip projects over the SVC RA junction. Heart is enlarged, stable. Mild to moderate mixed interstitial and airspace opacification, similar. Small bilateral pleural effusions. IMPRESSION: Congestive heart failure. Electronically Signed   By: Lorin Picket M.D.   On: 09/19/2017 07:39   Dg Chest Port 1 View  Result Date: 09/18/2017 CLINICAL DATA:  Sepsis EXAM: PORTABLE CHEST 1 VIEW COMPARISON:  340 hours FINDINGS: Stable tracheostomy tube and right PICC. Airspace disease throughout both lungs is stable on the right and worse on the left. No pneumothorax. Small pleural effusions are grossly unchanged. IMPRESSION: Diffuse  bilateral airspace disease is stable on the right but worse on the left. Small pleural effusions. Electronically Signed   By: Marybelle Killings M.D.   On: 09/18/2017 12:50      Scheduled Meds: . baclofen  5 mg Per Tube BID  . chlorhexidine gluconate (MEDLINE KIT)  15 mL Mouth Rinse BID  . Chlorhexidine Gluconate Cloth  6 each Topical Daily  . collagenase   Topical Daily  . Darbepoetin Alfa  25 mcg Subcutaneous Q28 days  . diltiazem  30 mg Per Tube Q6H  . fluticasone  2 spray Each Nare Daily  . free water  200 mL Per Tube Q8H  . furosemide  20 mg Intravenous Daily  . glycopyrrolate  1 mg Per Tube BID  . ipratropium-albuterol  3 mL Nebulization  TID  . levothyroxine  125 mcg Per Tube QAC breakfast  . metoprolol tartrate  12.5 mg Per Tube BID  . montelukast  10 mg Per Tube QHS  . multivitamin with minerals  1 tablet Per Tube Daily  . pantoprazole sodium  40 mg Per Tube Daily  . polyethylene glycol  17 g Oral Daily  . sodium chloride flush  10-40 mL Intracatheter Q12H  . sodium chloride flush  3 mL Intravenous Q12H  . [START ON 09/23/2017] Vitamin D (Ergocalciferol)  50,000 Units Per Tube Q Fri   Continuous Infusions: . ciprofloxacin 400 mg (09/20/17 0812)  . feeding supplement (VITAL AF 1.2 CAL) 1,000 mL (09/19/17 1318)  . meropenem (MERREM) IV Stopped (09/20/17 0545)     LOS: 2 days    Time spent: 60 minutes   Craig Dumas, MD  [facility] [email or web address] Password [facility] 09/20/2017, 8:38 AM

## 2017-09-20 NOTE — Progress Notes (Signed)
SLP Cancellation Note  Patient Details Name: Craig DiegoCharlie J Naples Jr. MRN: [id number] DOB: 11-05-1950   Cancelled treatment:       Reason Eval/Treat Not Completed: Medical issues which prohibited therapy - still on vent this morning. Will f/u for PMV trials once on TC - per critical care, should be on TC during the day.   Craig Oneal, Craig Oneal 09/20/2017, 9:34 AM  Craig HamLaura Paiewonsky, M.A. CCC-SLP [phone]

## 2017-09-20 NOTE — Progress Notes (Signed)
Patient ID: Craig Baria., male   DOB: 1951/07/14, 67 y.o.   MRN: [id number]          [facility]  Date of Admission:  09/18/2017           Day 3 meropenem        Day 2 ciprofloxacin ASSESSMENT: He is now afebrile.  Urine and blood cultures are negative.  His chest x-ray showed worsened infiltrates compared to his last film here in December.  Recent sputum culture at [facility] grew Serratia, Moraxella and Pseudomonas.  Sputum Gram stain here shows gram-negative rods.  PLAN: 1. 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 baclofen  5 mg Per Tube BID  . chlorhexidine gluconate (MEDLINE KIT)  15 mL Mouth Rinse BID  . Chlorhexidine Gluconate Cloth  6 each Topical Daily  . collagenase   Topical Daily  . Darbepoetin Alfa  25 mcg Subcutaneous Q28 days  . diltiazem  30 mg Per Tube Q6H  . fluticasone  2 spray Each Nare Daily  . free water  200 mL Per Tube Q8H  . furosemide  20 mg Intravenous Daily  . glycopyrrolate  1 mg Per Tube BID  . ipratropium-albuterol  3 mL Nebulization TID  . levothyroxine  125 mcg Per Tube QAC breakfast  . metoprolol tartrate  12.5 mg  Per Tube BID  . montelukast  10 mg Per Tube QHS  . multivitamin with minerals  1 tablet Per Tube Daily  . pantoprazole sodium  40 mg Per Tube Daily  . polyethylene glycol  17 g Oral Daily  . sodium chloride flush  10-40 mL Intracatheter Q12H  . sodium chloride flush  3 mL Intravenous Q12H  . [START ON 09/23/2017] Vitamin D (Ergocalciferol)  50,000 Units Per Tube Q Fri   Continuous Infusions: . ciprofloxacin 400 mg (09/20/17 0812)  . feeding supplement (VITAL AF 1.2 CAL) 1,000 mL (09/19/17 1318)  . meropenem (MERREM) IV Stopped (09/20/17 0545)   PRN Meds:.ondansetron **OR** ondansetron (ZOFRAN) IV, oxyCODONE, simethicone, sodium chloride flush  Review of Systems: Review of Systems  Unable to perform ROS: Intubated    No Known Allergies  OBJECTIVE: Vitals:   09/20/17 0300 09/20/17 0345 09/20/17 0400 09/20/17 0800  BP: 107/66  121/73 (!) 108/58  Pulse: (!) 119  (!) 59 93  Resp: _0 Temp:  98 F (36.7 C)  98.8 F (37.1 C)  TempSrc:  Oral  Oral  SpO2: 97% 99% 100% 98%  Weight:      Height:       Body mass index is 25.46 kg/m.  Physical Exam  Constitutional:  He is alert.  He does not nod yes  or no.  He attempts to answer questions by mouthing words but I cannot understand him.  Cardiovascular: Normal rate and regular rhythm.  No murmur heard. Pulmonary/Chest:  He has a tracheostomy tube.  He remains on the ventilator.  He has coarse airway sounds.  Abdominal: Soft. He exhibits no distension. There is no tenderness.    Lab Results Lab Results  Component Value Date   WBC 12.7 (H) 09/20/2017   HGB 8.3 (L) 09/20/2017   HCT 27.0 (L) 09/20/2017   MCV 80.1 09/20/2017   PLT 239 09/20/2017    Lab Results  Component Value Date   CREATININE 0.81 09/20/2017   BUN 38 (H) 09/20/2017   NA 146 (H) 09/20/2017   K 4.5 09/20/2017   CL 104 09/20/2017   CO2 31 09/20/2017    Lab Results  Component Value Date   ALT 273 (H) 09/20/2017   AST 160 (H) 09/20/2017    ALKPHOS 1,794 (H) 09/20/2017   BILITOT 1.5 (H) 09/20/2017     Microbiology: Recent Results (from the past 240 hour(s))  Urine Culture     Status: None   Collection Time: 09/18/17  3:37 AM  Result Value Ref Range Status   Specimen Description URINE, RANDOM  Final   Special Requests NONE  Final   Culture NO GROWTH  Final   Report Status 09/19/2017 FINAL  Final  Blood culture (routine x 2)     Status: None (Preliminary result)   Collection Time: 09/18/17  3:58 AM  Result Value Ref Range Status   Specimen Description BLOOD RIGHT HAND  Final   Special Requests   Final    BOTTLES DRAWN AEROBIC AND ANAEROBIC Blood Culture adequate volume   Culture NO GROWTH 1 DAY  Final   Report Status PENDING  Incomplete  Blood culture (routine x 2)     Status: None (Preliminary result)   Collection Time: 09/18/17  4:03 AM  Result Value Ref Range Status   Specimen Description BLOOD LEFT HAND  Final   Special Requests   Final    BOTTLES DRAWN AEROBIC AND ANAEROBIC Blood Culture adequate volume   Culture NO GROWTH 1 DAY  Final   Report Status PENDING  Incomplete  MRSA PCR Screening     Status: None   Collection Time: 09/18/17 12:20 PM  Result Value Ref Range Status   MRSA by PCR NEGATIVE NEGATIVE Final    Comment:        The GeneXpert MRSA Assay (FDA approved for NASAL specimens only), is one component of a comprehensive MRSA colonization surveillance program. It is not intended to diagnose MRSA infection nor to guide or monitor treatment for MRSA infections.   Culture, respiratory (NON-Expectorated)     Status: None (Preliminary result)   Collection Time: 09/19/17  2:11 PM  Result Value Ref Range Status   Specimen Description TRACHEAL ASPIRATE  Final   Special Requests NONE  Final   Gram Stain   Final    ABUNDANT WBC PRESENT,BOTH PMN AND MONONUCLEAR ABUNDANT GRAM NEGATIVE RODS MODERATE SQUAMOUS EPITHELIAL CELLS PRESENT    Culture TOO YOUNG TO READ  Final   Report Status PENDING   Incomplete    Michel Bickers, [facility] [phone] pager   [phone] cell 09/20/2017, 9:13 AM

## 2017-09-21 ENCOUNTER — Inpatient Hospital Stay (HOSPITAL_COMMUNITY): Payer: Medicare Other

## 2017-09-21 DIAGNOSIS — S78111D Complete traumatic amputation at level between right hip and knee, subsequent encounter: Secondary | ICD-10-CM | POA: Diagnosis not present

## 2017-09-21 DIAGNOSIS — Z930 Tracheostomy status: Secondary | ICD-10-CM | POA: Diagnosis not present

## 2017-09-21 DIAGNOSIS — R509 Fever, unspecified: Secondary | ICD-10-CM

## 2017-09-21 DIAGNOSIS — D72829 Elevated white blood cell count, unspecified: Secondary | ICD-10-CM

## 2017-09-21 DIAGNOSIS — A409 Streptococcal sepsis, unspecified: Secondary | ICD-10-CM | POA: Diagnosis not present

## 2017-09-21 DIAGNOSIS — J189 Pneumonia, unspecified organism: Secondary | ICD-10-CM | POA: Diagnosis not present

## 2017-09-21 DIAGNOSIS — Z89611 Acquired absence of right leg above knee: Secondary | ICD-10-CM | POA: Diagnosis not present

## 2017-09-21 DIAGNOSIS — R0902 Hypoxemia: Secondary | ICD-10-CM | POA: Diagnosis not present

## 2017-09-21 DIAGNOSIS — J9621 Acute and chronic respiratory failure with hypoxia: Secondary | ICD-10-CM | POA: Diagnosis not present

## 2017-09-21 DIAGNOSIS — I4891 Unspecified atrial fibrillation: Secondary | ICD-10-CM | POA: Diagnosis not present

## 2017-09-21 LAB — GLUCOSE, CAPILLARY
GLUCOSE-CAPILLARY: 119 mg/dL — AB (ref 65–99)
GLUCOSE-CAPILLARY: 98 mg/dL (ref 65–99)
Glucose-Capillary: 114 mg/dL — ABNORMAL HIGH (ref 65–99)

## 2017-09-21 LAB — CBC
HEMATOCRIT: 26.2 % — AB (ref 39.0–52.0)
Hemoglobin: 7.8 g/dL — ABNORMAL LOW (ref 13.0–17.0)
MCH: 24.4 pg — ABNORMAL LOW (ref 26.0–34.0)
MCHC: 29.8 g/dL — AB (ref 30.0–36.0)
MCV: 81.9 fL (ref 78.0–100.0)
PLATELETS: 199 10*3/uL (ref 150–400)
RBC: 3.2 MIL/uL — ABNORMAL LOW (ref 4.22–5.81)
RDW: 18.4 % — AB (ref 11.5–15.5)
WBC: 14.2 10*3/uL — AB (ref 4.0–10.5)

## 2017-09-21 LAB — HEPATITIS PANEL, ACUTE
HCV AB: 0.2 {s_co_ratio} (ref 0.0–0.9)
HEP A IGM: NEGATIVE
Hep B C IgM: NEGATIVE
Hepatitis B Surface Ag: NEGATIVE

## 2017-09-21 LAB — COMPREHENSIVE METABOLIC PANEL
ALT: 225 U/L — ABNORMAL HIGH (ref 17–63)
ANION GAP: 10 (ref 5–15)
AST: 118 U/L — ABNORMAL HIGH (ref 15–41)
Albumin: 1.8 g/dL — ABNORMAL LOW (ref 3.5–5.0)
Alkaline Phosphatase: 1586 U/L — ABNORMAL HIGH (ref 38–126)
BILIRUBIN TOTAL: 1.3 mg/dL — AB (ref 0.3–1.2)
BUN: 31 mg/dL — ABNORMAL HIGH (ref 6–20)
CHLORIDE: 102 mmol/L (ref 101–111)
CO2: 31 mmol/L (ref 22–32)
Calcium: 8.6 mg/dL — ABNORMAL LOW (ref 8.9–10.3)
Creatinine, Ser: 0.82 mg/dL (ref 0.61–1.24)
GFR calc Af Amer: >60 mL/min (ref >60)
Glucose, Bld: 122 mg/dL — ABNORMAL HIGH (ref 65–99)
POTASSIUM: 4.4 mmol/L (ref 3.5–5.1)
Sodium: 143 mmol/L (ref 135–145)
TOTAL PROTEIN: 7.3 g/dL (ref 6.5–8.1)

## 2017-09-21 LAB — MAGNESIUM: MAGNESIUM: 1.7 mg/dL (ref 1.7–2.4)

## 2017-09-21 LAB — PHOSPHORUS: PHOSPHORUS: 3.2 mg/dL (ref 2.5–4.6)

## 2017-09-21 LAB — PROCALCITONIN: PROCALCITONIN: 0.61 ng/mL

## 2017-09-21 LAB — AMMONIA: AMMONIA: 62 umol/L — AB (ref 9–35)

## 2017-09-21 MED ORDER — SODIUM CHLORIDE 0.9 % IV BOLUS (SEPSIS)
500.0000 mL | Freq: Once | INTRAVENOUS | Status: AC
  Administered 2017-09-21: 500 mL via INTRAVENOUS

## 2017-09-21 MED ORDER — ADULT MULTIVITAMIN W/MINERALS CH
1.0000 | ORAL_TABLET | Freq: Every day | ORAL | Status: DC
  Administered 2017-09-22 – 2017-09-24 (×3): 1
  Filled 2017-09-21 (×3): qty 1

## 2017-09-21 MED ORDER — CIPROFLOXACIN 500 MG/5ML (10%) PO SUSR
750.0000 mg | Freq: Two times a day (BID) | ORAL | Status: DC
  Administered 2017-09-21 – 2017-09-25 (×9): 750 mg via ORAL
  Filled 2017-09-21 (×9): qty 7.5

## 2017-09-21 NOTE — Progress Notes (Signed)
[facility] Pulmonary Diseases & Critical Care Medicine Progress Note  Patient Name: Craig Oneal. MRN: [id number] DOB: 04-Jan-1951    ADMISSION DATE:  09/18/2017 CONSULTATION DATE:  09/18/2017  REFERRING MD:  Dr. Brett Canales  REASON FOR CONSULTATION:  chronic respiratory failure   SUBJECTIVE:   -Interim events: tolerated ATC yesterday and placed on full support last night.   HISTORY OF PRESENT ILLNESS This 67 y.o. African American  male is seen in consultation at the request of Dr. Brett Canales for recommendations on further evaluation and management of chronic respiratory failure. He was transferred from [facility] with complaints of blood-tinged urine in Foley catheter noticed this morning.He is on Coumadin for atrial fibrillation, which has already been stopped with additional administration of vitamin K 5 mg.  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 He has chronic respiratory failure and requires nocturnal ventilator use. He underwent tracheostomy and PEG placement in 07/2016. He has a chronic indwelling Foley.  Trach aspirate isolated S marcescens, M catarrhalis (beta lactamase-positive) and carbapenem resistant-P aeruginosa. Pseudomonas is sensitive to Cipro but not meropenem. Has already been started on cefepime/vancomycin. Meropenem has been ordered.   OBJECTIVE:   VITAL SIGNS: BP (!) 98/37   Pulse 84   Temp 99.2 F (37.3 C) (Oral)   Resp (!) 25   Ht 5\' 10"  (1.778 m)   Wt 80.5 kg (177 lb 7.5 oz)   SpO2 100%   BMI 25.46 kg/m  Vitals:   09/21/17 0700 09/21/17 0820 09/21/17 0823 09/21/17 0828  BP:   (!) 98/37   Pulse:   84   Resp:   (!) 25   Temp: 99.2 F (37.3 C)     TempSrc: Oral     SpO2:  100% 100% 100%  Weight:      Height:        HEMODYNAMICS:    VENTILATOR SETTINGS: Vent Mode: PRVC FiO2 (%):  [40 %-60 %] 40 % Set Rate:  [16 bmp] 16 bmp Vt Set:  [500 mL] 500 mL PEEP:  [5 cmH20] 5  cmH20 Plateau Pressure:  [20 cmH20-23 cmH20] 21 cmH20  INTAKE / OUTPUT: I/O last 3 completed shifts: In: 3073.7 [I.V.:13; NG/GT:2060.7; IV Piggyback:1000] Out: 3450 [Urine:3450]  PHYSICAL EXAMINATION:  General: Chronically ill appearing male, appears older than stated age HEENT: Trach in place, cuff up, on ATC Neuro: Awake alert, answers yes no. Interacts with staff at what I expect to be his baseline level.  CV: IRIR , no MRG PULM: No distress on ATC GI: Soft nontender Extremities: warm/dry, 1+ edema  Skin: Documented sacral decubitus  LABS:  BMET Recent Labs  Lab 09/19/17 0200 09/20/17 0225 09/21/17 0350  NA 143 146* 143  K 4.3 4.5 4.4  CL 104 104 102  CO2 31 31 31   BUN 44* 38* 31*  CREATININE 0.87 0.81 0.82  GLUCOSE 110* 109* 122*    Electrolytes Recent Labs  Lab 09/19/17 0200 09/19/17 0758 09/20/17 0225 09/21/17 0350  CALCIUM 9.0  --  8.8* 8.6*  MG  --  2.1  --  1.7  PHOS  --   --   --  3.2    CBC Recent Labs  Lab 09/19/17 0200  09/19/17 1400 09/19/17 2332 09/20/17 0225 09/21/17 0350  WBC 11.3*  --   --   --  12.7* 14.2*  HGB 6.9*   < > 8.0* 8.0* 8.3* 7.8*  HCT 23.4*  --  26.6*  --  27.0* 26.2*  PLT 242  --   --   --  239 199   < > = values in this interval not displayed.    Coag's Recent Labs  Lab 09/18/17 1309 09/19/17 0200 09/20/17 0225  APTT 50* 49*  --   INR 1.63 1.48 1.23    Sepsis Markers Recent Labs  Lab 09/18/17 0616 09/18/17 0855 09/18/17 1309  LATICACIDVEN 0.7 0.7 0.9  PROCALCITON  --   --  1.60    ABG Recent Labs  Lab 09/18/17 0415  PHART 7.361  PCO2ART 55.9*  PO2ART 65.0*    Liver Enzymes Recent Labs  Lab 09/19/17 0200 09/20/17 0225 09/21/17 0350  AST 152* 160* 118*  ALT 297* 273* 225*  ALKPHOS 1,573* 1,794* 1,586*  BILITOT 1.7* 1.5* 1.3*  ALBUMIN 1.8* 1.9* 1.8*    Cardiac Enzymes No results for input(s): TROPONINI, PROBNP in the last 168 hours.  Glucose Recent Labs  Lab 09/20/17 2012  09/20/17 2343 09/21/17 0324  GLUCAP 97 99 119*    Imaging Dg Chest Port 1 View  Result Date: 09/21/2017 CLINICAL DATA:  Shortness of breath. EXAM: PORTABLE CHEST 1 VIEW COMPARISON:  09/19/2017 FINDINGS: Tracheostomy tube overlies the airway. The right PICC now crosses the midline and terminates over the region of the left brachiocephalic vein, a change from the prior study where the tip projected over the cavoatrial junction. The cardiac silhouette remains mildly enlarged. Mild bilateral perihilar opacity is similar to the prior study and likely represents edema. Left basilar opacity is unchanged and likely represents a combination of atelectasis and small pleural effusion. There is at most a trace residual right pleural effusion. No pneumothorax is identified. IMPRESSION: 1. Interval change in position of PICC tip, now in the left brachiocephalic vein. 2. Unchanged mild pulmonary edema, small left pleural effusion, and likely atelectasis in the left base. Electronically Signed   By: Sebastian AcheAllen  Grady M.D.   On: 09/21/2017 09:12    CULTURES: Results for orders placed or performed during the hospital encounter of 09/18/17  Urine Culture     Status: None   Collection Time: 09/18/17  3:37 AM  Result Value Ref Range Status   Specimen Description URINE, RANDOM  Final   Special Requests NONE  Final   Culture NO GROWTH  Final   Report Status 09/19/2017 FINAL  Final  Blood culture (routine x 2)     Status: None (Preliminary result)   Collection Time: 09/18/17  3:58 AM  Result Value Ref Range Status   Specimen Description BLOOD RIGHT HAND  Final   Special Requests   Final    BOTTLES DRAWN AEROBIC AND ANAEROBIC Blood Culture adequate volume   Culture NO GROWTH 2 DAYS  Final   Report Status PENDING  Incomplete  Blood culture (routine x 2)     Status: None (Preliminary result)   Collection Time: 09/18/17  4:03 AM  Result Value Ref Range Status   Specimen Description BLOOD LEFT HAND  Final   Special  Requests   Final    BOTTLES DRAWN AEROBIC AND ANAEROBIC Blood Culture adequate volume   Culture NO GROWTH 2 DAYS  Final   Report Status PENDING  Incomplete  MRSA PCR Screening     Status: None   Collection Time: 09/18/17 12:20 PM  Result Value Ref Range Status   MRSA by PCR NEGATIVE NEGATIVE Final    Comment:        The GeneXpert MRSA Assay (FDA approved for NASAL specimens only), is one component  of a comprehensive MRSA colonization surveillance program. It is not intended to diagnose MRSA infection nor to guide or monitor treatment for MRSA infections.   Culture, respiratory (NON-Expectorated)     Status: None (Preliminary result)   Collection Time: 09/19/17  2:11 PM  Result Value Ref Range Status   Specimen Description TRACHEAL ASPIRATE  Final   Special Requests NONE  Final   Gram Stain   Final    ABUNDANT WBC PRESENT,BOTH PMN AND MONONUCLEAR ABUNDANT GRAM NEGATIVE RODS MODERATE SQUAMOUS EPITHELIAL CELLS PRESENT    Culture   Final    MODERATE PSEUDOMONAS AERUGINOSA SUSCEPTIBILITIES TO FOLLOW    Report Status PENDING  Incomplete    OTHER STUDIES:    ANTIBIOTICS: Meropenem 09/18/2017 >> ciprofloxacin 09/20/2017>>   SIGNIFICANT EVENTS: 1/13 >> admitted to 60M. Started antibiotic therapy for S marcescens, M catarrhalis, P aeruginosa   LINES/TUBES: Chronic indwelling trach PEG   ASSESSMENT/PLAN:   Acute on chronic respiratory failure: Chronic in the setting of cerebellar atrophy. Acute in the setting of PNA due to S marcescens, M catarrhalis (betalactamase-positive) andcarbapenem resistant - P aeruginosa.  - CXR worsening somewhat today. ? Pulm edema component.   Plan: - ATC as tolerated during the day. Will attempt this for 24 hours today and check ABG in the AM.  - Continue ABX as above - Repeat procalcitonin - Continue daily IV diuresis, may want to consider increasing dose.  - Titrated FiO2 to Goal SpO2 92% - Not candidate for trach downsize at this  time.  Joneen Roach, AGACNP-BC [facility] Pager [phone] or [phone]  09/21/2017 10:52 AM  Attending Note:  67 year old male with acute on chronic respiratory failure due to sepsis that is tolerating TC well this AM so far.  On exam, on TC and awake and interactive.  I reviewed CXR myself, trach is in good position.  Discussed with PCCM-NP.  Acute on chronic respiratory failure  - Continue weaning efforts.    - TC as tolerated at this point, including nights.  Hypoxemia:  - Titrate O2 for sat of 88-92%.  - Goal FiO2 of 28%  Trach status  - Maintain cuffed trach and current size and type  - If off vent x48 hours, will consider change to a cuffless trach.  Patient seen and examined, agree with above note.  I dictated the care and orders written for this patient under my direction.  Alyson Reedy, MD [phone]

## 2017-09-21 NOTE — Progress Notes (Signed)
Pharmacy Antibiotic Note  Craig DiegoCharlie J Bentson Jr. is a 67 y.o. male admitted on 09/18/2017 from [facility].  Continues on abx for pneumonia. CXR definitely worse since last month. ID is on board. Tmax of 101.1 yesterday, WBC 14.2. Resp cx still pending with GNRs.   Plan: Continue ciprofloxacin to 400mg  IV Q8h Continue Merrem 1g IV Q8h Monitor clinical picture, renal function F/U C&S, abx deescalation / LOT  Height: 5\' 10"  (177.8 cm) Weight: 177 lb 7.5 oz (80.5 kg) IBW/kg (Calculated) : 73  Temp (24hrs), Avg:98.8 F (37.1 C), Min:98.5 F (36.9 C), Max:99.2 F (37.3 C)  Recent Labs  Lab 09/18/17 0347 09/18/17 0414 09/18/17 0616 09/18/17 0855 09/18/17 1309 09/19/17 0200 09/20/17 0225 09/21/17 0350  WBC 11.4*  --   --   --  11.6* 11.3* 12.7* 14.2*  CREATININE 0.75  --   --   --  0.81 0.87 0.81 0.82  LATICACIDVEN  --  0.62 0.7 0.7 0.9  --   --   --     Estimated Creatinine Clearance: 94 mL/min (by C-G formula based on SCr of 0.82 mg/dL).     Craig Oneal, PharmD, BCPS Clinical Pharmacist Pager [phone] 09/21/2017 8:29 AM

## 2017-09-21 NOTE — Progress Notes (Addendum)
PROGRESS NOTE    Craig Oneal.  NTI:[id number] DOB: 1950-09-25 DOA: 09/18/2017 PCP: System, Pcp Not In     Brief Narrative:  Craig Oneal. is a 67 y.o. 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 Review of records from the nursing facility shows that on January 11 he grew out a multidrug resistant E. coli sensitive to meropenem.  He also had a sputum culture positive for Serratia marcescens and Moraxella catarrhalis with heavy growth.  He also had light growth of Pseudomonas which may be a colonizer for him. Patient was admitted for further treatment.   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 ([place])   Tracheostomy status ([place])   Acute on chronic respiratory failure with hypoxemia (HCC)   Ventilator dependence ([place])   Bilateral hydronephrosis   Chronic indwelling Foley catheter   Acute posthemorrhagic anemia   E-coli UTI   Pneumonia due to Serratia marcescens (HCC)   Moraxella catarrhalis pneumonia (HCC)   Other cirrhosis of liver (HCC)   Chronic bilateral pleural effusions   Gastrostomy tube dependent ([place])   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 ([place])   Cerebellar atrophy   Sepsis secondary to  chronic  indwelling Foley,  HCAP   -Culture and sensitivity report from [employer], in paper chart  -ID consulted -MRSA negative -Continue merrem/cipro , recent sputum cxs have shown  Serratia, Moraxella and Pseudomonas   Meropenem will cover Serratia isolate ,  cipro added to cover Pseudomonas   Day 5 of meropenem day 4  of ciprofloxacin    Continue current antibiotics pending final sputum culture results, which are growing GNR    Also has a hx of VRE  UTI  Acute on chronic hypoxemic respiratory failure  -Patient with chronic respiratory failure requiring ventilator at night, trach collar during the day -Continue vent support, PCCM following  -CXR with fluid overload, continue gentle diuresis with IV Lasix  Acute on chronic systolic CHF  -Recent echo 07/2017 showed EF 40-45%, global hypokinesis -Continue IV lasix , blood pressure soft and monitor renal function -Daily weight, strict I/Os   Decubitus ulcer of right ischial area stage IV POA -Wound RN consulted   Chronic atrial fibrillation  -Currently rate controlled, continue diltiazem, lopressor  -Hold coumadin for now due to anemia/cirrhosis , status post receiving vitamin K  Acute posthemorrhagic anemia on chronic anemia of chronic disease -Baseline Hgb around 8 , currently 7.8 -Patient with significant hematuria due to catheter balloon being in his ureter.  Will continue to hold his anticoagulation. Transfused 2u pRBC  1/14, hemoglobin improved from 6.9> 8.3>7.8 Continue PPI for GI prophylaxis  Cerebellar atrophy and paraplegia, neurogenic bladder, dysphagia  -Bedbound, on trach-vent, PEG, chronic foley   Cirrhosis of the liver with elevated liver enzymes -No known history of liver disease, although he has  had chronically elevated alk phos as high as 255 in 2018 as well as AST 59. Now presenting with Alk phos [phone] ,  AST 197>160>118  ,  ALT 382>273. >225 Total bilirubin 1.3 ?Etiology secondary to hepatic injury in  setting of sepsis , underlying cirrhosis -Abdominal US revealed gallstones and sludge with small amount pericholecystic fluid, as well as cirrhosis hepatitis panel negative, GGT 404.  Platelets are within normal limit 264 >199 -Hold lipitor and tylenol  -[facility] consulted for further evaluation, according to the daughter patient has never seen a GI doctor for his liver cirrhosis, discussed with Dr. Collene Mares   , she feels that his abnormal liver function is multifactorial in the setting of shock liver/sepsis/underlying cirrhosis, doubt cholangitis. If liver function continues to stay elevated and trended output then he will need formal GI evaluation. He is not a candidate for any invasive procedures given his  ventilator dependence   Hypothyroidism -Continue synthroid   PAD s/p AKA of RLE -Hold coumadin in setting of anemia . Hematuria resolved   Full thickness split of penis -Noted last admission 12/13, outpatient urology with Dr. Gloriann Loan planned    DVT prophylaxis: SCD Code Status: Full Family Communication: No family at bedside Disposition Plan: Continue stepdown   Consultants:   ID   Pulmonary  Procedures:   None   Antimicrobials:  Anti-infectives (From admission, onward)   Start     Dose/Rate Route Frequency Ordered Stop   09/20/17 1600  ciprofloxacin (CIPRO) IVPB 400 mg     400 mg 200 mL/hr over 60 Minutes Intravenous Every 8 hours 09/20/17 0958     09/20/17 0830  ciprofloxacin (CIPRO) IVPB 400 mg  Status:  Discontinued     400 mg 200 mL/hr over 60 Minutes Intravenous Every 12 hours 09/20/17 0744 09/20/17 0958   09/19/17 1000  ciprofloxacin (CIPRO) 500 MG/5ML (10%) suspension 750 mg  Status:  Discontinued     750 mg Oral 2 times daily 09/19/17 0904 09/20/17 0744   09/19/17 0900  ciprofloxacin (CIPRO) 500 MG/5ML (10%) suspension 500 mg  Status:  Discontinued     500 mg Oral 2 times daily 09/19/17 0857 09/19/17 0904   09/19/17 0600  vancomycin (VANCOCIN)  IVPB 1000 mg/200 mL premix  Status:  Discontinued     1,000 mg 200 mL/hr over 60 Minutes Intravenous Every 24 hours 09/18/17 0503 09/18/17 0855   09/19/17 0500  ceFEPIme (MAXIPIME) 2 g in dextrose 5 % 50 mL IVPB  Status:  Discontinued     2 g 100 mL/hr over 30 Minutes Intravenous Every 24 hours 09/18/17 0503 09/18/17 0607   09/18/17 1400  meropenem (MERREM) 1 g in sodium chloride 0.9 % 100 mL IVPB     1 g 200 mL/hr over 30 Minutes Intravenous Every 8 hours 09/18/17 0627     09/18/17 0415  ceFEPIme (MAXIPIME) 2 g in dextrose 5 % 50 mL IVPB     2 g 100 mL/hr over 30 Minutes Intravenous  Once 09/18/17 0405 09/18/17 0443   09/18/17 0415  vancomycin (VANCOCIN) IVPB 1000 mg/200 mL premix  Status:  Discontinued     1,000 mg 200 mL/hr over 60 Minutes Intravenous  Once 09/18/17 0405 09/18/17 0411   09/18/17 0415  vancomycin (VANCOCIN) 1,500 mg in sodium chloride 0.9 % 500 mL IVPB     1,500 mg 250 mL/hr over 120 Minutes Intravenous  Once 09/18/17 0411 09/18/17 0646       Subjective:  Blood pressure noted to  be soft, afebrile  Objective: Vitals:   09/21/17 0700 09/21/17 0820 09/21/17 0823 09/21/17 0828  BP:   (!) 98/37   Pulse:   84   Resp:   (!) 25   Temp: 99.2 F (37.3 C)     TempSrc: Oral     SpO2:  100% 100% 100%  Weight:      Height:        Intake/Output Summary (Last 24 hours) at 09/21/2017 0854 Last data filed at 09/21/2017 0600 Gross per 24 hour  Intake 2873.67 ml  Output 3050 ml  Net -176.33 ml   Filed Weights   09/19/17 0343 09/20/17 0200 09/21/17 0300  Weight: 80 kg (176 lb 5.9 oz) 80.5 kg (177 lb 7.5 oz) 80.5 kg (177 lb 7.5 oz)    Examination:  General exam: Appears calm, on trach-vent  Respiratory system: On trach-vent  Cardiovascular system: S1 & S2 heard. No JVD, murmurs, rubs, gallops or clicks. No pedal edema. Gastrointestinal system: Abdomen is nondistended, soft and nontender. Normal bowel sounds heard. +PEG  Central nervous system: Alert, able to follow  simple commands but remains paraplegic with minimal movement of left hand  Extremities: Right AKA   Data Reviewed: I have personally reviewed following labs and imaging studies  CBC: Recent Labs  Lab 09/18/17 0347 09/18/17 1309  09/19/17 0200 09/19/17 0758 09/19/17 1400 09/19/17 2332 09/20/17 0225 09/21/17 0350  WBC 11.4* 11.6*  --  11.3*  --   --   --  12.7* 14.2*  NEUTROABS 9.1* 9.0*  --   --   --   --   --   --   --   HGB 7.7* 7.3*   < > 6.9* 8.0* 8.0* 8.0* 8.3* 7.8*  HCT 26.8* 25.7*  --  23.4*  --  26.6*  --  27.0* 26.2*  MCV 80.7 80.3  --  79.9  --   --   --  80.1 81.9  PLT 262 264  --  242  --   --   --  239 199   < > = values in this interval not displayed.   Basic Metabolic Panel: Recent Labs  Lab 09/18/17 0347 09/18/17 1309 09/19/17 0200 09/19/17 0758 09/20/17 0225 09/21/17 0350  NA 139 142 143  --  146* 143  K 4.5 4.4 4.3  --  4.5 4.4  CL 100* 101 104  --  104 102  CO2 28 29 31   --  31 31  GLUCOSE 91 76 110*  --  109* 122*  BUN 48* 41* 44*  --  38* 31*  CREATININE 0.75 0.81 0.87  --  0.81 0.82  CALCIUM 9.2 9.2 9.0  --  8.8* 8.6*  MG  --   --   --  2.1  --  1.7  PHOS  --   --   --   --   --  3.2   GFR: Estimated Creatinine Clearance: 94 mL/min (by C-G formula based on SCr of 0.82 mg/dL). Liver Function Tests: Recent Labs  Lab 09/18/17 0347 09/18/17 1309 09/19/17 0200 09/20/17 0225 09/21/17 0350  AST 197* 174* 152* 160* 118*  ALT 382* 352* 297* 273* 225*  ALKPHOS 1,094* 1,379* 1,573* 1,794* 1,586*  BILITOT 2.6* 2.6* 1.7* 1.5* 1.3*  PROT 8.0 8.0 7.1 7.4 7.3  ALBUMIN 2.2* 1.9* 1.8* 1.9* 1.8*   No results for input(s): LIPASE, AMYLASE in the last 168 hours. No results for input(s): AMMONIA in the last 168 hours. Coagulation Profile:  Recent Labs  Lab 09/18/17 0347 09/18/17 1309 09/19/17 0200 09/20/17 0225  INR 2.00 1.63 1.48 1.23   Cardiac Enzymes: No results for input(s): CKTOTAL, CKMB, CKMBINDEX, TROPONINI in the last 168 hours. BNP  (last 3 results) No results for input(s): PROBNP in the last 8760 hours. HbA1C: No results for input(s): HGBA1C in the last 72 hours. CBG: Recent Labs  Lab 09/20/17 2012 09/20/17 2343 09/21/17 0324  GLUCAP 97 99 119*   Lipid Profile: No results for input(s): CHOL, HDL, LDLCALC, TRIG, CHOLHDL, LDLDIRECT in the last 72 hours. Thyroid Function Tests: No results for input(s): TSH, T4TOTAL, FREET4, T3FREE, THYROIDAB in the last 72 hours. Anemia Panel: No results for input(s): VITAMINB12, FOLATE, FERRITIN, TIBC, IRON, RETICCTPCT in the last 72 hours. Sepsis Labs: Recent Labs  Lab 09/18/17 0414 09/18/17 0616 09/18/17 0855 09/18/17 1309  PROCALCITON  --   --   --  1.60  LATICACIDVEN 0.62 0.7 0.7 0.9    Recent Results (from the past 240 hour(s))  Urine Culture     Status: None   Collection Time: 09/18/17  3:37 AM  Result Value Ref Range Status   Specimen Description URINE, RANDOM  Final   Special Requests NONE  Final   Culture NO GROWTH  Final   Report Status 09/19/2017 FINAL  Final  Blood culture (routine x 2)     Status: None (Preliminary result)   Collection Time: 09/18/17  3:58 AM  Result Value Ref Range Status   Specimen Description BLOOD RIGHT HAND  Final   Special Requests   Final    BOTTLES DRAWN AEROBIC AND ANAEROBIC Blood Culture adequate volume   Culture NO GROWTH 2 DAYS  Final   Report Status PENDING  Incomplete  Blood culture (routine x 2)     Status: None (Preliminary result)   Collection Time: 09/18/17  4:03 AM  Result Value Ref Range Status   Specimen Description BLOOD LEFT HAND  Final   Special Requests   Final    BOTTLES DRAWN AEROBIC AND ANAEROBIC Blood Culture adequate volume   Culture NO GROWTH 2 DAYS  Final   Report Status PENDING  Incomplete  MRSA PCR Screening     Status: None   Collection Time: 09/18/17 12:20 PM  Result Value Ref Range Status   MRSA by PCR NEGATIVE NEGATIVE Final    Comment:        The GeneXpert MRSA Assay (FDA approved for  NASAL specimens only), is one component of a comprehensive MRSA colonization surveillance program. It is not intended to diagnose MRSA infection nor to guide or monitor treatment for MRSA infections.   Culture, respiratory (NON-Expectorated)     Status: None (Preliminary result)   Collection Time: 09/19/17  2:11 PM  Result Value Ref Range Status   Specimen Description TRACHEAL ASPIRATE  Final   Special Requests NONE  Final   Gram Stain   Final    ABUNDANT WBC PRESENT,BOTH PMN AND MONONUCLEAR ABUNDANT GRAM NEGATIVE RODS MODERATE SQUAMOUS EPITHELIAL CELLS PRESENT    Culture TOO YOUNG TO READ  Final   Report Status PENDING  Incomplete       Radiology Studies: Dg Chest Port 1 View  Result Date: 09/20/2017 CLINICAL DATA:  PICC placement EXAM: PORTABLE CHEST 1 VIEW COMPARISON:  Chest radiograph from earlier today. FINDINGS: Right PICC terminates at the cavoatrial junction. Tracheostomy tube terminates over the tracheal air column at the thoracic inlet. Stable cardiomediastinal silhouette with mild cardiomegaly. No pneumothorax. Stable small left pleural effusion.  Stable trace right pleural effusion. Stable mild pulmonary edema and left basilar lung opacity. IMPRESSION: 1. Right PICC terminates at the cavoatrial junction. 2. Stable mild congestive heart failure. 3. Stable small left pleural effusion. 4. Stable left basilar lung opacity, probably atelectasis. Electronically Signed   By: Ilona Sorrel M.D.   On: 09/20/2017 00:51      Scheduled Meds: . baclofen  5 mg Per Tube BID  . chlorhexidine gluconate (MEDLINE KIT)  15 mL Mouth Rinse BID  . collagenase   Topical Daily  . Darbepoetin Alfa  25 mcg Subcutaneous Q28 days  . diltiazem  30 mg Per Tube Q6H  . fluticasone  2 spray Each Nare Daily  . free water  200 mL Per Tube Q8H  . furosemide  20 mg Intravenous Daily  . glycopyrrolate  1 mg Per Tube BID  . ipratropium-albuterol  3 mL Nebulization TID  . levothyroxine  125 mcg Per Tube  QAC breakfast  . metoprolol tartrate  12.5 mg Per Tube BID  . montelukast  10 mg Per Tube QHS  . multivitamin with minerals  1 tablet Per Tube Daily  . pantoprazole sodium  40 mg Per Tube Daily  . polyethylene glycol  17 g Oral Daily  . sodium chloride flush  10-40 mL Intracatheter Q12H  . sodium chloride flush  3 mL Intravenous Q12H  . [START ON 09/23/2017] Vitamin D (Ergocalciferol)  50,000 Units Per Tube Q Fri   Continuous Infusions: . ciprofloxacin 400 mg (09/21/17 0842)  . feeding supplement (VITAL AF 1.2 CAL) 1,000 mL (09/20/17 2200)  . meropenem (MERREM) IV Stopped (09/21/17 0550)     LOS: 3 days    Time spent: 60 minutes   Reyne Dumas, MD  [facility] [email or web address] Password [facility] 09/21/2017, 8:54 AM

## 2017-09-21 NOTE — Progress Notes (Addendum)
[facility]   Reason for visit: Follow up on pneumonia  Interval History: feels better, afebrile > 24 hours, WBC up some to 14.2  CXR independently reviewed and some increased edema; Meropenem day 4 cipro day 3  Physical Exam: Constitutional:  Vitals:   09/21/17 0823 09/21/17 0828  BP: (!) 98/37   Pulse: 84   Resp: (!) 25   Temp:    SpO2: 100% 100%   patient appears in NAD HENT: + trach collar Respiratory: Normal respiratory effort; CTA B, anterior exam Cardiovascular: RRR GI: soft, nt, nd MS: right AKA stump, no erythema  Review of Systems: Constitutional: negative for chills Respiratory: negative for pleurisy/chest pain Gastrointestinal: negative for diarrhea Reviewed with yes no nodding  Lab Results  Component Value Date   WBC 14.2 (H) 09/21/2017   HGB 7.8 (L) 09/21/2017   HCT 26.2 (L) 09/21/2017   MCV 81.9 09/21/2017   PLT 199 09/21/2017    Lab Results  Component Value Date   CREATININE 0.82 09/21/2017   BUN 31 (H) 09/21/2017   NA 143 09/21/2017   K 4.4 09/21/2017   CL 102 09/21/2017   CO2 31 09/21/2017    Lab Results  Component Value Date   ALT 225 (H) 09/21/2017   AST 118 (H) 09/21/2017   ALKPHOS 1,586 (H) 09/21/2017     Microbiology: Recent Results (from the past 240 hour(s))  Urine Culture     Status: None   Collection Time: 09/18/17  3:37 AM  Result Value Ref Range Status   Specimen Description URINE, RANDOM  Final   Special Requests NONE  Final   Culture NO GROWTH  Final   Report Status 09/19/2017 FINAL  Final  Blood culture (routine x 2)     Status: None (Preliminary result)   Collection Time: 09/18/17  3:58 AM  Result Value Ref Range Status   Specimen Description BLOOD RIGHT HAND  Final   Special Requests   Final    BOTTLES DRAWN AEROBIC AND ANAEROBIC Blood Culture adequate volume   Culture NO GROWTH 2 DAYS  Final   Report Status PENDING  Incomplete  Blood culture (routine x 2)     Status: None  (Preliminary result)   Collection Time: 09/18/17  4:03 AM  Result Value Ref Range Status   Specimen Description BLOOD LEFT HAND  Final   Special Requests   Final    BOTTLES DRAWN AEROBIC AND ANAEROBIC Blood Culture adequate volume   Culture NO GROWTH 2 DAYS  Final   Report Status PENDING  Incomplete  MRSA PCR Screening     Status: None   Collection Time: 09/18/17 12:20 PM  Result Value Ref Range Status   MRSA by PCR NEGATIVE NEGATIVE Final    Comment:        The GeneXpert MRSA Assay (FDA approved for NASAL specimens only), is one component of a comprehensive MRSA colonization surveillance program. It is not intended to diagnose MRSA infection nor to guide or monitor treatment for MRSA infections.   Culture, respiratory (NON-Expectorated)     Status: None (Preliminary result)   Collection Time: 09/19/17  2:11 PM  Result Value Ref Range Status   Specimen Description TRACHEAL ASPIRATE  Final   Special Requests NONE  Final   Gram Stain   Final    ABUNDANT WBC PRESENT,BOTH PMN AND MONONUCLEAR ABUNDANT GRAM NEGATIVE RODS MODERATE SQUAMOUS EPITHELIAL CELLS PRESENT    Culture TOO YOUNG TO READ  Final  Report Status PENDING  Incomplete    Impression/Plan:  1. Pneumonia - some worsening on CXR but I think more fluid.  Improving symptomatically on meropenem and cipro.  Will wait for culture results and change accordingly. Growing GNR.    2.  Antibiotic treatment - on cipro IV, will change to po/per tube  3.  Fever - improved, afebrile > 24 hours  4. Leukocytosis - minimal increase, will continue to monitor.

## 2017-09-21 NOTE — Evaluation (Signed)
[facility] Speaking Valve - Evaluation Patient Details  Name: Craig DiegoCharlie J Gagliano Jr. MRN: [id number] Date of Birth: October 23, 1950  Today's Date: 09/21/2017 Time: 1521-1607 SLP Time Calculation (min) (ACUTE ONLY): 46 min  Past Medical History:  Past Medical History:  Diagnosis Date  . Cerebellar atrophy   . Chronic indwelling Foley catheter   . Chronic kidney disease (CKD), stage IV (severe) (HCC)   . COPD (chronic obstructive pulmonary disease) (HCC)   . Paraplegia (HCC)   . Paroxysmal atrial fibrillation (HCC)   . PEG (percutaneous endoscopic gastrostomy) status (HCC)   . Systolic heart failure (HCC)   . Tracheostomy in place [facility](HCC)   . Unilateral AKA, right [facility](HCC)    Past Surgical History:  Past Surgical History:  Procedure Laterality Date  . IR REPLC GASTRO/COLONIC TUBE PERCUT W/FLUORO  08/03/2017   HPI:  Pt is a 66 y.o.malewith medical history significantofparaplegia with ataxia(cerebellar atrophysyndrome), tracheostomy 07/2016 with chronic respiratory failure with chronic PEG and foley, recent right AKA November 2018due to severe peripheral arterial disease, CKDstage IV, PAF (on coumadin), COPD, SCHF with EF 40%,and hypothyroidism,vent dependent at night only,on trach collar during the day, [facility], H[facility]. Pt typically wears his PMV throughout all waking hours.   Assessment / Plan / Recommendation Clinical Impression  Pt tolerated cuff deflation and PMV placement without overt signs of intolerance. VS remained stable and there was no evidence of back pressure after over 20 minutes of use. His voice is clear and strong, although his speech is mildly-moderately dysarthric at baseline. Min cues were provided for volitional coughs to clear secretions orally. Pt wears his valve during all waking hours at [facility] - recommend to start the same here, with intermittent supervision. SLP will follow briefly for tolerance. SLP Visit Diagnosis:  Aphonia (R49.1)    SLP Assessment  Patient needs continued Speech Lanaguage Pathology Services    Follow Up Recommendations  Other (comment)(return to [facility])    Frequency and Duration min 2x/week  1 week    PMSV Trial PMSV was placed for: 23+ min Able to redirect subglottic air through upper airway: Yes Able to Attain Phonation: Yes Voice Quality: Normal Able to Expectorate Secretions: Yes Level of Secretion Expectoration with PMSV: Oral Breath Support for Phonation: Adequate Intelligibility: Intelligibility reduced Conversation: 75-100% accurate Respirations During Trial: 22 SpO2 During Trial: 98 % Pulse During Trial: 97 Behavior: Alert;Controlled;Cooperative;Expresses self well;Responsive to questions   Tracheostomy Tube       Vent Dependency  FiO2 (%): 40 %    Cuff Deflation Trial  GO Tolerated Cuff Deflation: Yes Length of Time for Cuff Deflation Trial: 23+ Behavior: Alert;Cooperative;Expresses self well;Responsive to questions        Maxcine Hamaiewonsky, Sadao Weyer 09/21/2017, 4:43 PM   Maxcine HamLaura Paiewonsky, M.A. CCC-SLP [phone]

## 2017-09-22 DIAGNOSIS — R000 Tachycardia, unspecified: Secondary | ICD-10-CM | POA: Diagnosis not present

## 2017-09-22 DIAGNOSIS — A408 Other streptococcal sepsis: Secondary | ICD-10-CM

## 2017-09-22 DIAGNOSIS — Z1639 Resistance to other specified antimicrobial drug: Secondary | ICD-10-CM | POA: Diagnosis not present

## 2017-09-22 DIAGNOSIS — J151 Pneumonia due to Pseudomonas: Secondary | ICD-10-CM | POA: Diagnosis not present

## 2017-09-22 DIAGNOSIS — Z1623 Resistance to quinolones and fluoroquinolones: Secondary | ICD-10-CM

## 2017-09-22 DIAGNOSIS — D62 Acute posthemorrhagic anemia: Secondary | ICD-10-CM | POA: Diagnosis not present

## 2017-09-22 DIAGNOSIS — Z930 Tracheostomy status: Secondary | ICD-10-CM | POA: Diagnosis not present

## 2017-09-22 DIAGNOSIS — J9621 Acute and chronic respiratory failure with hypoxia: Secondary | ICD-10-CM | POA: Diagnosis not present

## 2017-09-22 DIAGNOSIS — S78111A Complete traumatic amputation at level between right hip and knee, initial encounter: Secondary | ICD-10-CM | POA: Diagnosis not present

## 2017-09-22 LAB — BLOOD GAS, ARTERIAL
Acid-Base Excess: 8.1 mmol/L — ABNORMAL HIGH (ref 0.0–2.0)
Bicarbonate: 33.2 mmol/L — ABNORMAL HIGH (ref 20.0–28.0)
Drawn by: 51191
FIO2: 40
O2 SAT: 96.7 %
PATIENT TEMPERATURE: 98.6
PO2 ART: 90 mmHg (ref 83.0–108.0)
pCO2 arterial: 56.4 mmHg — ABNORMAL HIGH (ref 32.0–48.0)
pH, Arterial: 7.388 (ref 7.350–7.450)

## 2017-09-22 LAB — CULTURE, RESPIRATORY

## 2017-09-22 LAB — COMPREHENSIVE METABOLIC PANEL
ALBUMIN: 1.9 g/dL — AB (ref 3.5–5.0)
ALK PHOS: 1446 U/L — AB (ref 38–126)
ALT: 186 U/L — AB (ref 17–63)
ANION GAP: 10 (ref 5–15)
AST: 98 U/L — ABNORMAL HIGH (ref 15–41)
BUN: 30 mg/dL — ABNORMAL HIGH (ref 6–20)
CHLORIDE: 102 mmol/L (ref 101–111)
CO2: 30 mmol/L (ref 22–32)
Calcium: 8.4 mg/dL — ABNORMAL LOW (ref 8.9–10.3)
Creatinine, Ser: 0.75 mg/dL (ref 0.61–1.24)
GFR calc Af Amer: >60 mL/min (ref >60)
GFR calc non Af Amer: >60 mL/min (ref >60)
GLUCOSE: 114 mg/dL — AB (ref 65–99)
Potassium: 4.5 mmol/L (ref 3.5–5.1)
SODIUM: 142 mmol/L (ref 135–145)
Total Bilirubin: 1 mg/dL (ref 0.3–1.2)
Total Protein: 7.4 g/dL (ref 6.5–8.1)

## 2017-09-22 LAB — GLUCOSE, CAPILLARY
GLUCOSE-CAPILLARY: 104 mg/dL — AB (ref 65–99)
GLUCOSE-CAPILLARY: 105 mg/dL — AB (ref 65–99)
Glucose-Capillary: 104 mg/dL — ABNORMAL HIGH (ref 65–99)

## 2017-09-22 LAB — CULTURE, RESPIRATORY W GRAM STAIN

## 2017-09-22 LAB — PROCALCITONIN: Procalcitonin: 2.65 ng/mL

## 2017-09-22 MED ORDER — LACTULOSE 10 GM/15ML PO SOLN
10.0000 g | Freq: Three times a day (TID) | ORAL | Status: DC
  Administered 2017-09-22 – 2017-09-25 (×7): 10 g via ORAL
  Filled 2017-09-22 (×8): qty 15

## 2017-09-22 NOTE — Progress Notes (Signed)
[facility] Pulmonary Diseases & Critical Care Medicine Progress Note  Patient Name: Craig Oneal. MRN: [id number] DOB: Jun 23, 1951    ADMISSION DATE:  09/18/2017 CONSULTATION DATE:  09/18/2017  REFERRING MD:  Dr. Brett Canales  REASON FOR CONSULTATION:  chronic respiratory failure   SUBJECTIVE:   -Interim events: 24 hours on trach collar without distress  HISTORY OF PRESENT ILLNESS This 67 y.o. African American  male is seen in consultation at the request of Dr. Brett Canales for recommendations on further evaluation and management of chronic respiratory failure. He was transferred from [facility] with complaints of blood-tinged urine in Foley catheter noticed this morning.He is on Coumadin for atrial fibrillation, which has already been stopped with additional administration of vitamin K 5 mg.  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 He has chronic respiratory failure and requires nocturnal ventilator use. He underwent tracheostomy and PEG placement in 07/2016. He has a chronic indwelling Foley.  Trach aspirate isolated S marcescens, M catarrhalis (beta lactamase-positive) and carbapenem resistant-P aeruginosa. Pseudomonas is sensitive to Cipro but not meropenem. Has already been started on cefepime/vancomycin. Meropenem has been ordered.   OBJECTIVE:   VITAL SIGNS: BP (!) 123/111 (BP Location: Left Arm)   Pulse 83   Temp 98.1 F (36.7 C)   Resp (!) 24   Ht 5\' 10"  (1.778 m)   Wt 78.1 kg (172 lb 2.9 oz)   SpO2 98%   BMI 24.71 kg/m  Vitals:   09/22/17 0758 09/22/17 0800 09/22/17 1130 09/22/17 1201  BP:  (!) 143/60 121/69 (!) 123/111  Pulse: 86 (!) 107 83   Resp: 20  (!) 24   Temp:  98.2 F (36.8 C)  98.1 F (36.7 C)  TempSrc:  Oral    SpO2: 97% 99% 98%   Weight:      Height:        HEMODYNAMICS:    VENTILATOR SETTINGS: FiO2 (%):  [35 %-40 %] 35 %  INTAKE / OUTPUT: I/O last 3 completed shifts: In:  2541.7 [I.V.:16; NG/GT:2025.7; IV Piggyback:500] Out: 3527 [Urine:3525; Stool:2]  PHYSICAL EXAMINATION:  General: 67 year old male chronically trach dependent no acute distress at rest currently on trach collar times 24 hours HEENT: Trach with trach collar Passy-Muir valve in place he is handling his secretions well at this time. PSY: Good effect Neuro: Follows commands weakly CV: Sounds are regular PULM: Decreased breath sounds mild rhonchi bilaterally ZH:YQMV, non-tender, bsx4 active  Extremities: warm/dry, 1+ edema, flaccid musculature Skin: no rashes or lesions   LABS:  BMET Recent Labs  Lab 09/20/17 0225 09/21/17 0350 09/22/17 0341  NA 146* 143 142  K 4.5 4.4 4.5  CL 104 102 102  CO2 31 31 30   BUN 38* 31* 30*  CREATININE 0.81 0.82 0.75  GLUCOSE 109* 122* 114*    Electrolytes Recent Labs  Lab 09/19/17 0758 09/20/17 0225 09/21/17 0350 09/22/17 0341  CALCIUM  --  8.8* 8.6* 8.4*  MG 2.1  --  1.7  --   PHOS  --   --  3.2  --     CBC Recent Labs  Lab 09/19/17 0200  09/19/17 1400 09/19/17 2332 09/20/17 0225 09/21/17 0350  WBC 11.3*  --   --   --  12.7* 14.2*  HGB 6.9*   < > 8.0* 8.0* 8.3* 7.8*  HCT 23.4*  --  26.6*  --  27.0* 26.2*  PLT 242  --   --   --  239 199   < > =  values in this interval not displayed.    Coag's Recent Labs  Lab 09/18/17 1309 09/19/17 0200 09/20/17 0225  APTT 50* 49*  --   INR 1.63 1.48 1.23    Sepsis Markers Recent Labs  Lab 09/18/17 0616 09/18/17 0855 09/18/17 1309 09/21/17 1150 09/22/17 0341  LATICACIDVEN 0.7 0.7 0.9  --   --   PROCALCITON  --   --  1.60 0.61 2.65    ABG Recent Labs  Lab 09/18/17 0415 09/22/17 0335  PHART 7.361 7.388  PCO2ART 55.9* 56.4*  PO2ART 65.0* 90.0    Liver Enzymes Recent Labs  Lab 09/20/17 0225 09/21/17 0350 09/22/17 0341  AST 160* 118* 98*  ALT 273* 225* 186*  ALKPHOS 1,794* 1,586* 1,446*  BILITOT 1.5* 1.3* 1.0  ALBUMIN 1.9* 1.8* 1.9*    Cardiac Enzymes No  results for input(s): TROPONINI, PROBNP in the last 168 hours.  Glucose Recent Labs  Lab 09/20/17 2343 09/21/17 0324 09/21/17 1938 09/21/17 2327 09/22/17 0345 09/22/17 1157  GLUCAP 99 119* 114* 98 104* 104*    Imaging No results found.  CULTURES: Results for orders placed or performed during the hospital encounter of 09/18/17  Urine Culture     Status: None   Collection Time: 09/18/17  3:37 AM  Result Value Ref Range Status   Specimen Description URINE, RANDOM  Final   Special Requests NONE  Final   Culture NO GROWTH  Final   Report Status 09/19/2017 FINAL  Final  Blood culture (routine x 2)     Status: None (Preliminary result)   Collection Time: 09/18/17  3:58 AM  Result Value Ref Range Status   Specimen Description BLOOD RIGHT HAND  Final   Special Requests   Final    BOTTLES DRAWN AEROBIC AND ANAEROBIC Blood Culture adequate volume   Culture NO GROWTH 4 DAYS  Final   Report Status PENDING  Incomplete  Blood culture (routine x 2)     Status: None (Preliminary result)   Collection Time: 09/18/17  4:03 AM  Result Value Ref Range Status   Specimen Description BLOOD LEFT HAND  Final   Special Requests   Final    BOTTLES DRAWN AEROBIC AND ANAEROBIC Blood Culture adequate volume   Culture NO GROWTH 4 DAYS  Final   Report Status PENDING  Incomplete  MRSA PCR Screening     Status: None   Collection Time: 09/18/17 12:20 PM  Result Value Ref Range Status   MRSA by PCR NEGATIVE NEGATIVE Final    Comment:        The GeneXpert MRSA Assay (FDA approved for NASAL specimens only), is one component of a comprehensive MRSA colonization surveillance program. It is not intended to diagnose MRSA infection nor to guide or monitor treatment for MRSA infections.   Culture, respiratory (NON-Expectorated)     Status: None   Collection Time: 09/19/17  2:11 PM  Result Value Ref Range Status   Specimen Description TRACHEAL ASPIRATE  Final   Special Requests NONE  Final   Gram  Stain   Final    ABUNDANT WBC PRESENT,BOTH PMN AND MONONUCLEAR ABUNDANT GRAM NEGATIVE RODS MODERATE SQUAMOUS EPITHELIAL CELLS PRESENT    Culture MODERATE PSEUDOMONAS AERUGINOSA  Final   Report Status 09/22/2017 FINAL  Final   Organism ID, Bacteria PSEUDOMONAS AERUGINOSA  Final      Susceptibility   Pseudomonas aeruginosa - MIC*    CEFTAZIDIME 16 INTERMEDIATE Intermediate     CIPROFLOXACIN 1 SENSITIVE Sensitive  GENTAMICIN <=1 SENSITIVE Sensitive     IMIPENEM >=16 RESISTANT Resistant     CEFEPIME 16 INTERMEDIATE Intermediate     * MODERATE PSEUDOMONAS AERUGINOSA    OTHER STUDIES:    ANTIBIOTICS: Meropenem 09/18/2017 >> ciprofloxacin 09/20/2017>>   SIGNIFICANT EVENTS: 1/13 >> admitted to [facility]. Started antibiotic therapy for S marcescens, M catarrhalis, P aeruginosa 09/22/1998 1924 hrs. of mechanical ventilatory support comfortable on trach collar  LINES/TUBES: Chronic indwelling trach PEG   ASSESSMENT/PLAN:   Acute on chronic respiratory failure: Chronic in the setting of cerebellar atrophy. Acute in the setting of PNA due to S marcescens, M catarrhalis (betalactamase-positive) andcarbapenem resistant - P aeruginosa.     Plan: -09/22/2017 currently is 24 hours of full mechanical ventilatory support. - Continue ABX as above - Repeat procalcitonin 1/17 2.65.  ID is following he has ciprofloxacin sensitive Pseudomonas.  Follow recommendations for continuation of Cipro per infectious disease. -Negative I&O as tolerated - Titrated FiO2 to Goal SpO2 92% - Not candidate for trach downsize at this time. -Return to [facility] for remains off ventilatory support for 48 hours.  Brett Canales Craig Oneal ACNP [facility] Pager [phone] till 1 pm If no answer page [phone] 09/22/2017, 12:08 PM

## 2017-09-22 NOTE — Progress Notes (Signed)
  Speech Language Pathology Treatment: Nada Boozer Speaking valve  Patient Details Name: Craig Oneal. MRN: [id number] DOB: Nov 14, 1950 Today's Date: 09/22/2017 Time: 3582-5189 SLP Time Calculation (min) (ACUTE ONLY): 14 min  Assessment / Plan / Recommendation Clinical Impression  Pt wearing PMV with trach collar upon arrival. VS stable throughout session and phonation adequate for conversation indicating pt tolerance of PMV. Suction not provided during session, but pt complained of additional secretion in throat when wearing PMV. Minimal verbal cues provided to encourage pt to cough secretions up to mouth for oral suctioning. Recommend continued use of PMV during all waking hours, removing valve during sleeping hours with intermittent supervision of secretions and VS throughout day. Pt met goals and is at baseline level, therefore, SLP services no longer indicated. Reconsult in the event of any acute changes.    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 Pt typically wears his PMV throughout all waking hours.      SLP Plan  Discharge SLP treatment due to (comment)(pt met goal. services no longer indicated)       Recommendations         Patient may use Passy-Muir Speech Valve: During all waking hours (remove during sleep) PMSV Supervision: Intermittent         Follow up Recommendations: Other (comment)(return to [facility]) SLP Visit Diagnosis: Aphonia (R49.1) Plan: Discharge SLP treatment due to (comment)(pt met goal. services no longer indicated)        GO              [place]  SLP Student Clinician   [place]  09/22/2017, 9:50 AM

## 2017-09-22 NOTE — Care Management Important Message (Signed)
Important Message  Patient Details  Name: Craig DiegoCharlie J Philemon Jr. MRN: [id number] Date of Birth: [birth year]   Medicare Important Message Given:  Yes    Craig Oneal, Craig [place]rose [place], RN 09/22/2017, 2:48 PM

## 2017-09-22 NOTE — Progress Notes (Addendum)
PROGRESS NOTE    Craig Oneal.  MEQ:[id number] DOB: 1950/12/29 DOA: 09/18/2017 PCP: System, Pcp Not In     Brief Narrative:  Craig Kirschner. is a 67 y.o. 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 Review of records from the nursing facility shows that on January 11 he grew out a multidrug resistant E. coli sensitive to meropenem.  He also had a sputum culture positive for Serratia marcescens and Moraxella catarrhalis with heavy growth.  He also had light growth of Pseudomonas which may be a colonizer for him. Patient was admitted for further treatment.   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 ([place])   Tracheostomy status ([place])   Acute on chronic respiratory failure with hypoxemia (HCC)   Ventilator dependence ([place])   Bilateral hydronephrosis   Chronic indwelling Foley catheter   Acute posthemorrhagic anemia   E-coli UTI   Pneumonia due to Serratia marcescens (HCC)   Moraxella catarrhalis pneumonia (HCC)   Other cirrhosis of liver (HCC)   Chronic bilateral pleural effusions   Gastrostomy tube dependent ([place])   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 ProCalcitonin 2.65 Also has a hx of previous VRE  UTI  Acute on chronic hypoxemic respiratory failure  -Patient with chronic respiratory failure requiring ventilator at night, trach collar during the day -Continue vent support, PCCM following  09/22/2017 currently is 24 hours of full mechanical ventilatory support  -CXR with fluid overload, continue gentle diuresis with IV Lasix Return to [facility] for remains off ventilatory support for 48 hours.   Acute on chronic systolic CHF  -Recent echo 07/2017 showed EF 40-45%, global hypokinesis -Continue IV lasix , blood pressure soft and monitor renal function -Daily weight, strict I/Os   Decubitus ulcer of right ischial area stage IV POA -Wound RN consulted   Chronic atrial fibrillation  -Currently rate controlled, continue diltiazem, lopressor  -Holding  coumadin for now due to anemia/cirrhosis , status post receiving vitamin K Resume Coumadin prior to discharge and let [facility] monitor INR on a daily basis  Acute posthemorrhagic anemia on chronic anemia of chronic disease -Baseline Hgb around 8 , currently 7.8 -Patient with significant hematuria due to catheter balloon being in his ureter.  Will continue to hold his anticoagulation. Transfused 2u pRBC  1/14, hemoglobin improved from 6.9> 8.3>7.8, continue to follow Continue PPI for GI prophylaxis  Cerebellar atrophy and  paraplegia, neurogenic bladder, dysphagia  -Bedbound, on trach-vent, PEG, chronic foley   Cirrhosis of the liver with elevated liver enzymes -No known history of liver  disease, although he has had chronically elevated alk phos as high as 255 in 2018 as well as AST 59. Now presenting with Alk phos [phone] >1446  AST 197>160>118>98  ,  ALT 382>273. >225>186 Total bilirubin 1.3, ammonia 62, started on lactulose ?Etiology secondary to hepatic injury in setting of sepsis , underlying cirrhosis -Abdominal US revealed gallstones and sludge with small amount pericholecystic fluid, as well as cirrhosis hepatitis panel negative, GGT 404.  Platelets are within normal limit 264 >199 -Hold lipitor and tylenol  -[facility] consulted for further evaluation, according to the daughter patient has never seen a GI doctor for his liver cirrhosis, discussed with Dr. Collene Mares   , she feels that his abnormal liver function is multifactorial in the setting of shock liver/sepsis/underlying cirrhosis, doubt cholangitis. If liver function continues to stay elevated and trended output then he will need formal GI evaluation. He is not a candidate for any invasive procedures given his  ventilator dependence   Hypothyroidism -Continue synthroid   PAD s/p AKA of RLE -Hold coumadin in setting of anemia . Hematuria resolved   Full thickness split of penis -Noted last admission 12/13, outpatient urology with Dr. Gloriann Loan planned    DVT prophylaxis: SCD Code Status: Full Family Communication: No family at bedside Disposition Plan: Continue stepdown, anticipate discharge in 1-2 days   Consultants:   ID   Pulmonary  Procedures:   None   Antimicrobials:  Anti-infectives (From admission, onward)   Start     Dose/Rate Route Frequency Ordered Stop   09/21/17 1300  ciprofloxacin (CIPRO) 500 MG/5ML (10%) suspension 750 mg     750 mg Oral 2 times daily 09/21/17 0942 09/28/17 1019   09/20/17 1600  ciprofloxacin (CIPRO) IVPB 400 mg  Status:  Discontinued     400 mg 200 mL/hr over 60 Minutes Intravenous Every 8 hours 09/20/17 0958 09/21/17 0942   09/20/17 0830   ciprofloxacin (CIPRO) IVPB 400 mg  Status:  Discontinued     400 mg 200 mL/hr over 60 Minutes Intravenous Every 12 hours 09/20/17 0744 09/20/17 0958   09/19/17 1000  ciprofloxacin (CIPRO) 500 MG/5ML (10%) suspension 750 mg  Status:  Discontinued     750 mg Oral 2 times daily 09/19/17 0904 09/20/17 0744   09/19/17 0900  ciprofloxacin (CIPRO) 500 MG/5ML (10%) suspension 500 mg  Status:  Discontinued     500 mg Oral 2 times daily 09/19/17 0857 09/19/17 0904   09/19/17 0600  vancomycin (VANCOCIN) IVPB 1000 mg/200 mL premix  Status:  Discontinued     1,000 mg 200 mL/hr over 60 Minutes Intravenous Every 24 hours 09/18/17 0503 09/18/17 0855   09/19/17 0500  ceFEPIme (MAXIPIME) 2 g in dextrose 5 % 50 mL IVPB  Status:  Discontinued     2 g 100 mL/hr over 30 Minutes Intravenous Every 24 hours 09/18/17 0503 09/18/17 0607   09/18/17 1400  meropenem (MERREM) 1 g in sodium chloride 0.9 % 100 mL IVPB  Status:  Discontinued     1 g 200 mL/hr over 30 Minutes Intravenous Every 8 hours 09/18/17 0627 09/22/17 1006   09/18/17 0415  ceFEPIme (MAXIPIME) 2 g in dextrose 5 % 50 mL IVPB     2 g 100 mL/hr over 30 Minutes Intravenous  Once 09/18/17 0405 09/18/17 0443   09/18/17 0415  vancomycin (  VANCOCIN) IVPB 1000 mg/200 mL premix  Status:  Discontinued     1,000 mg 200 mL/hr over 60 Minutes Intravenous  Once 09/18/17 0405 09/18/17 0411   09/18/17 0415  vancomycin (VANCOCIN) 1,500 mg in sodium chloride 0.9 % 500 mL IVPB     1,500 mg 250 mL/hr over 120 Minutes Intravenous  Once 09/18/17 0411 09/18/17 0646       Subjective: Patient   vent dependent, now on the vent for the last 24 hours. Otherwise well, answers questions by nodding his head  Objective: Vitals:   09/22/17 0800 09/22/17 1130 09/22/17 1201 09/22/17 1334  BP: (!) 143/60 121/69 (!) 123/111   Pulse: (!) 107 83  (!) 105  Resp:  (!) 24  (!) 30  Temp: 98.2 F (36.8 C)  98.1 F (36.7 C)   TempSrc: Oral     SpO2: 99% 98%  94%  Weight:        Height:        Intake/Output Summary (Last 24 hours) at 09/22/2017 1423 Last data filed at 09/22/2017 1159 Gross per 24 hour  Intake 1003 ml  Output 2876 ml  Net -1873 ml   Filed Weights   09/20/17 0200 09/21/17 0300 09/22/17 0124  Weight: 80.5 kg (177 lb 7.5 oz) 80.5 kg (177 lb 7.5 oz) 78.1 kg (172 lb 2.9 oz)    Examination:  General exam: Appears calm, on trach-vent  Respiratory system: On trach-vent  Cardiovascular system: S1 & S2 heard. No JVD, murmurs, rubs, gallops or clicks. No pedal edema. Gastrointestinal system: Abdomen is nondistended, soft and nontender. Normal bowel sounds heard. +PEG  Central nervous system: Alert, able to follow simple commands but remains paraplegic with minimal movement of left hand  Extremities: Right AKA   Data Reviewed: I have personally reviewed following labs and imaging studies  CBC: Recent Labs  Lab 09/18/17 0347 09/18/17 1309  09/19/17 0200 09/19/17 0758 09/19/17 1400 09/19/17 2332 09/20/17 0225 09/21/17 0350  WBC 11.4* 11.6*  --  11.3*  --   --   --  12.7* 14.2*  NEUTROABS 9.1* 9.0*  --   --   --   --   --   --   --   HGB 7.7* 7.3*   < > 6.9* 8.0* 8.0* 8.0* 8.3* 7.8*  HCT 26.8* 25.7*  --  23.4*  --  26.6*  --  27.0* 26.2*  MCV 80.7 80.3  --  79.9  --   --   --  80.1 81.9  PLT 262 264  --  242  --   --   --  239 199   < > = values in this interval not displayed.   Basic Metabolic Panel: Recent Labs  Lab 09/18/17 1309 09/19/17 0200 09/19/17 0758 09/20/17 0225 09/21/17 0350 09/22/17 0341  NA 142 143  --  146* 143 142  K 4.4 4.3  --  4.5 4.4 4.5  CL 101 104  --  104 102 102  CO2 29 31  --  _0 GLUCOSE 76 110*  --  109* 122* 114*  BUN 41* 44*  --  38* 31* 30*  CREATININE 0.81 0.87  --  0.81 0.82 0.75  CALCIUM 9.2 9.0  --  8.8* 8.6* 8.4*  MG  --   --  2.1  --  1.7  --   PHOS  --   --   --   --  3.2  --    GFR: Estimated  Creatinine Clearance: 96.3 mL/min (by C-G formula based on SCr of 0.75 mg/dL). Liver  Function Tests: Recent Labs  Lab 09/18/17 1309 09/19/17 0200 09/20/17 0225 09/21/17 0350 09/22/17 0341  AST 174* 152* 160* 118* 98*  ALT 352* 297* 273* 225* 186*  ALKPHOS 1,379* 1,573* 1,794* 1,586* 1,446*  BILITOT 2.6* 1.7* 1.5* 1.3* 1.0  PROT 8.0 7.1 7.4 7.3 7.4  ALBUMIN 1.9* 1.8* 1.9* 1.8* 1.9*   No results for input(s): LIPASE, AMYLASE in the last 168 hours. Recent Labs  Lab 09/21/17 1150  AMMONIA 62*   Coagulation Profile: Recent Labs  Lab 09/18/17 0347 09/18/17 1309 09/19/17 0200 09/20/17 0225  INR 2.00 1.63 1.48 1.23   Cardiac Enzymes: No results for input(s): CKTOTAL, CKMB, CKMBINDEX, TROPONINI in the last 168 hours. BNP (last 3 results) No results for input(s): PROBNP in the last 8760 hours. HbA1C: No results for input(s): HGBA1C in the last 72 hours. CBG: Recent Labs  Lab 09/21/17 0324 09/21/17 1938 09/21/17 2327 09/22/17 0345 09/22/17 1157  GLUCAP 119* 114* 98 104* 104*   Lipid Profile: No results for input(s): CHOL, HDL, LDLCALC, TRIG, CHOLHDL, LDLDIRECT in the last 72 hours. Thyroid Function Tests: No results for input(s): TSH, T4TOTAL, FREET4, T3FREE, THYROIDAB in the last 72 hours. Anemia Panel: No results for input(s): VITAMINB12, FOLATE, FERRITIN, TIBC, IRON, RETICCTPCT in the last 72 hours. Sepsis Labs: Recent Labs  Lab 09/18/17 0414 09/18/17 0616 09/18/17 0855 09/18/17 1309 09/21/17 1150 09/22/17 0341  PROCALCITON  --   --   --  1.60 0.61 2.65  LATICACIDVEN 0.62 0.7 0.7 0.9  --   --     Recent Results (from the past 240 hour(s))  Urine Culture     Status: None   Collection Time: 09/18/17  3:37 AM  Result Value Ref Range Status   Specimen Description URINE, RANDOM  Final   Special Requests NONE  Final   Culture NO GROWTH  Final   Report Status 09/19/2017 FINAL  Final  Blood culture (routine x 2)     Status: None (Preliminary result)   Collection Time: 09/18/17  3:58 AM  Result Value Ref Range Status   Specimen Description  BLOOD RIGHT HAND  Final   Special Requests   Final    BOTTLES DRAWN AEROBIC AND ANAEROBIC Blood Culture adequate volume   Culture NO GROWTH 4 DAYS  Final   Report Status PENDING  Incomplete  Blood culture (routine x 2)     Status: None (Preliminary result)   Collection Time: 09/18/17  4:03 AM  Result Value Ref Range Status   Specimen Description BLOOD LEFT HAND  Final   Special Requests   Final    BOTTLES DRAWN AEROBIC AND ANAEROBIC Blood Culture adequate volume   Culture NO GROWTH 4 DAYS  Final   Report Status PENDING  Incomplete  MRSA PCR Screening     Status: None   Collection Time: 09/18/17 12:20 PM  Result Value Ref Range Status   MRSA by PCR NEGATIVE NEGATIVE Final    Comment:        The GeneXpert MRSA Assay (FDA approved for NASAL specimens only), is one component of a comprehensive MRSA colonization surveillance program. It is not intended to diagnose MRSA infection nor to guide or monitor treatment for MRSA infections.   Culture, respiratory (NON-Expectorated)     Status: None   Collection Time: 09/19/17  2:11 PM  Result Value Ref Range Status   Specimen Description TRACHEAL ASPIRATE  Final  Special Requests NONE  Final   Gram Stain   Final    ABUNDANT WBC PRESENT,BOTH PMN AND MONONUCLEAR ABUNDANT GRAM NEGATIVE RODS MODERATE SQUAMOUS EPITHELIAL CELLS PRESENT    Culture MODERATE PSEUDOMONAS AERUGINOSA  Final   Report Status 09/22/2017 FINAL  Final   Organism ID, Bacteria PSEUDOMONAS AERUGINOSA  Final      Susceptibility   Pseudomonas aeruginosa - MIC*    CEFTAZIDIME 16 INTERMEDIATE Intermediate     CIPROFLOXACIN 1 SENSITIVE Sensitive     GENTAMICIN <=1 SENSITIVE Sensitive     IMIPENEM >=16 RESISTANT Resistant     CEFEPIME 16 INTERMEDIATE Intermediate     * MODERATE PSEUDOMONAS AERUGINOSA       Radiology Studies: Dg Chest Port 1 View  Result Date: 09/21/2017 CLINICAL DATA:  Shortness of breath. EXAM: PORTABLE CHEST 1 VIEW COMPARISON:  09/19/2017  FINDINGS: Tracheostomy tube overlies the airway. The right PICC now crosses the midline and terminates over the region of the left brachiocephalic vein, a change from the prior study where the tip projected over the cavoatrial junction. The cardiac silhouette remains mildly enlarged. Mild bilateral perihilar opacity is similar to the prior study and likely represents edema. Left basilar opacity is unchanged and likely represents a combination of atelectasis and small pleural effusion. There is at most a trace residual right pleural effusion. No pneumothorax is identified. IMPRESSION: 1. Interval change in position of PICC tip, now in the left brachiocephalic vein. 2. Unchanged mild pulmonary edema, small left pleural effusion, and likely atelectasis in the left base. Electronically Signed   By: Logan Bores M.D.   On: 09/21/2017 09:12      Scheduled Meds: . baclofen  5 mg Per Tube BID  . chlorhexidine gluconate (MEDLINE KIT)  15 mL Mouth Rinse BID  . ciprofloxacin  750 mg Oral BID  . collagenase   Topical Daily  . Darbepoetin Alfa  25 mcg Subcutaneous Q28 days  . diltiazem  30 mg Per Tube Q6H  . fluticasone  2 spray Each Nare Daily  . free water  200 mL Per Tube Q8H  . furosemide  20 mg Intravenous Daily  . glycopyrrolate  1 mg Per Tube BID  . ipratropium-albuterol  3 mL Nebulization TID  . lactulose  10 g Oral TID  . levothyroxine  125 mcg Per Tube QAC breakfast  . metoprolol tartrate  12.5 mg Per Tube BID  . montelukast  10 mg Per Tube QHS  . multivitamin with minerals  1 tablet Per Tube Daily  . pantoprazole sodium  40 mg Per Tube Daily  . polyethylene glycol  17 g Oral Daily  . sodium chloride flush  10-40 mL Intracatheter Q12H  . sodium chloride flush  3 mL Intravenous Q12H  . [START ON 09/23/2017] Vitamin D (Ergocalciferol)  50,000 Units Per Tube Q Fri   Continuous Infusions: . feeding supplement (VITAL AF 1.2 CAL) 1,000 mL (09/22/17 0520)     LOS: 4 days    Time spent: 60  minutes   Reyne Dumas, MD  [facility] [email or web address] Password [facility] 09/22/2017, 2:23 PM

## 2017-09-22 NOTE — Progress Notes (Signed)
Dressing to right buttock changed.  Area cleansed with normal saline.  Santyl applied to wound bed, moist gauzed applied and covered with folded ABD pad secured with hypofix.  Right single lumen PICC remains intact.  Positive blood return noted.  Flushes easily.  GJ tube dressing changed.  No drainage noted.  Xeroform gauze applied to urinary meatus as per order.  Safety and comfort measures maintained, call bell within reach.

## 2017-09-22 NOTE — Progress Notes (Signed)
[facility]   Reason for visit: Follow up on pneumonia  Interval History: culture with Pseudomonas, cipro sensitive; imipenem resistant; afebrile, no new complaints; no associated rash. Day 4 cipro  Physical Exam: Constitutional:  Vitals:   09/22/17 0758 09/22/17 0800  BP:  (!) 143/60  Pulse: 86 (!) 107  Resp: 20   Temp:  98.2 F (36.8 C)  SpO2: 97% 99%   patient appears in NAD Eyes: anicteric HENT:+ trach collar Respiratory: Normal respiratory effort; CTA B, no wheezes Cardiovascular: tachy RR GI: soft, nt, nd  Review of Systems: Constitutional: negative for fevers and chills Gastrointestinal: negative for diarrhea Responds by nodding  Lab Results  Component Value Date   WBC 14.2 (H) 09/21/2017   HGB 7.8 (L) 09/21/2017   HCT 26.2 (L) 09/21/2017   MCV 81.9 09/21/2017   PLT 199 09/21/2017    Lab Results  Component Value Date   CREATININE 0.75 09/22/2017   BUN 30 (H) 09/22/2017   NA 142 09/22/2017   K 4.5 09/22/2017   CL 102 09/22/2017   CO2 30 09/22/2017    Lab Results  Component Value Date   ALT 186 (H) 09/22/2017   AST 98 (H) 09/22/2017   ALKPHOS 1,446 (H) 09/22/2017     Microbiology: Recent Results (from the past 240 hour(s))  Urine Culture     Status: None   Collection Time: 09/18/17  3:37 AM  Result Value Ref Range Status   Specimen Description URINE, RANDOM  Final   Special Requests NONE  Final   Culture NO GROWTH  Final   Report Status 09/19/2017 FINAL  Final  Blood culture (routine x 2)     Status: None (Preliminary result)   Collection Time: 09/18/17  3:58 AM  Result Value Ref Range Status   Specimen Description BLOOD RIGHT HAND  Final   Special Requests   Final    BOTTLES DRAWN AEROBIC AND ANAEROBIC Blood Culture adequate volume   Culture NO GROWTH 4 DAYS  Final   Report Status PENDING  Incomplete  Blood culture (routine x 2)     Status: None (Preliminary result)   Collection Time: 09/18/17  4:03 AM    Result Value Ref Range Status   Specimen Description BLOOD LEFT HAND  Final   Special Requests   Final    BOTTLES DRAWN AEROBIC AND ANAEROBIC Blood Culture adequate volume   Culture NO GROWTH 4 DAYS  Final   Report Status PENDING  Incomplete  MRSA PCR Screening     Status: None   Collection Time: 09/18/17 12:20 PM  Result Value Ref Range Status   MRSA by PCR NEGATIVE NEGATIVE Final    Comment:        The GeneXpert MRSA Assay (FDA approved for NASAL specimens only), is one component of a comprehensive MRSA colonization surveillance program. It is not intended to diagnose MRSA infection nor to guide or monitor treatment for MRSA infections.   Culture, respiratory (NON-Expectorated)     Status: None   Collection Time: 09/19/17  2:11 PM  Result Value Ref Range Status   Specimen Description TRACHEAL ASPIRATE  Final   Special Requests NONE  Final   Gram Stain   Final    ABUNDANT WBC PRESENT,BOTH PMN AND MONONUCLEAR ABUNDANT GRAM NEGATIVE RODS MODERATE SQUAMOUS EPITHELIAL CELLS PRESENT    Culture MODERATE PSEUDOMONAS AERUGINOSA  Final   Report Status 09/22/2017 FINAL  Final   Organism ID, Bacteria PSEUDOMONAS AERUGINOSA  Final  Susceptibility   Pseudomonas aeruginosa - MIC*    CEFTAZIDIME 16 INTERMEDIATE Intermediate     CIPROFLOXACIN 1 SENSITIVE Sensitive     GENTAMICIN <=1 SENSITIVE Sensitive     IMIPENEM >=16 RESISTANT Resistant     CEFEPIME 16 INTERMEDIATE Intermediate     * MODERATE PSEUDOMONAS AERUGINOSA    Impression/Plan:  1. Pseudomonal pneumonia - cipro sensitive so will stop meropenem.  Improved.  Continue with po/tube cipro 750 mg twice a day for 6 more days.  2.  Multidrug resistance - fortunately cipro sensitive though I suspect if he develops pneumonia again, will need Zerbaxa empirically with carbapenem resistance.    3.  Fever - remains afebrile.  I will sign off, please call with any questions. thanks

## 2017-09-22 NOTE — Progress Notes (Signed)
Patient on 8 lpm oxygen via ATM 40%FIO2.  Tolerating ATM well.  Vital AF tube feeding infusing, dressing changed to site.  Trach care given, large amount of tan drainage noted around Portex trach.  Trach suctioned for a large amount of tan thin secretions, patient tolerated suctioning well.  SCD on left lower extremity as well a Pravelon boot. Left peripheral IV flushed with 3 mls of normal saline as per protocol, right single lumen picc patent, flushes easily and has a positive blood retrurn.  Foley patent and draining clear yellow urine.  Patient denies pain at this time.  Will continue to monitor.  Patient has Passey Muir valve on, will remove at bedtime as per order.  Safety and comfort measure maintained, Call bell within reach.   

## 2017-09-23 DIAGNOSIS — N1330 Unspecified hydronephrosis: Secondary | ICD-10-CM

## 2017-09-23 DIAGNOSIS — A408 Other streptococcal sepsis: Secondary | ICD-10-CM | POA: Diagnosis not present

## 2017-09-23 DIAGNOSIS — L89314 Pressure ulcer of right buttock, stage 4: Secondary | ICD-10-CM

## 2017-09-23 DIAGNOSIS — G119 Hereditary ataxia, unspecified: Secondary | ICD-10-CM

## 2017-09-23 DIAGNOSIS — J156 Pneumonia due to other aerobic Gram-negative bacteria: Secondary | ICD-10-CM

## 2017-09-23 DIAGNOSIS — Z9911 Dependence on respirator [ventilator] status: Secondary | ICD-10-CM

## 2017-09-23 DIAGNOSIS — D62 Acute posthemorrhagic anemia: Secondary | ICD-10-CM | POA: Diagnosis not present

## 2017-09-23 DIAGNOSIS — I4891 Unspecified atrial fibrillation: Secondary | ICD-10-CM | POA: Diagnosis not present

## 2017-09-23 DIAGNOSIS — J9621 Acute and chronic respiratory failure with hypoxia: Secondary | ICD-10-CM | POA: Diagnosis not present

## 2017-09-23 DIAGNOSIS — J90 Pleural effusion, not elsewhere classified: Secondary | ICD-10-CM

## 2017-09-23 DIAGNOSIS — J189 Pneumonia, unspecified organism: Secondary | ICD-10-CM | POA: Diagnosis not present

## 2017-09-23 DIAGNOSIS — S78111A Complete traumatic amputation at level between right hip and knee, initial encounter: Secondary | ICD-10-CM | POA: Diagnosis not present

## 2017-09-23 DIAGNOSIS — K7469 Other cirrhosis of liver: Secondary | ICD-10-CM

## 2017-09-23 DIAGNOSIS — Z9289 Personal history of other medical treatment: Secondary | ICD-10-CM

## 2017-09-23 DIAGNOSIS — G319 Degenerative disease of nervous system, unspecified: Secondary | ICD-10-CM | POA: Diagnosis not present

## 2017-09-23 DIAGNOSIS — Z452 Encounter for adjustment and management of vascular access device: Secondary | ICD-10-CM

## 2017-09-23 LAB — CULTURE, BLOOD (ROUTINE X 2)
Culture: NO GROWTH
Culture: NO GROWTH
Special Requests: ADEQUATE
Special Requests: ADEQUATE

## 2017-09-23 LAB — CBC
HCT: 31 % — ABNORMAL LOW (ref 39.0–52.0)
HEMOGLOBIN: 9 g/dL — AB (ref 13.0–17.0)
MCH: 24.3 pg — ABNORMAL LOW (ref 26.0–34.0)
MCHC: 29 g/dL — ABNORMAL LOW (ref 30.0–36.0)
MCV: 83.6 fL (ref 78.0–100.0)
PLATELETS: 244 10*3/uL (ref 150–400)
RBC: 3.71 MIL/uL — AB (ref 4.22–5.81)
RDW: 18.9 % — ABNORMAL HIGH (ref 11.5–15.5)
WBC: 12.4 10*3/uL — AB (ref 4.0–10.5)

## 2017-09-23 LAB — PROTIME-INR
INR: 1.05
PROTHROMBIN TIME: 13.6 s (ref 11.4–15.2)

## 2017-09-23 LAB — GLUCOSE, CAPILLARY
GLUCOSE-CAPILLARY: 114 mg/dL — AB (ref 65–99)
Glucose-Capillary: 101 mg/dL — ABNORMAL HIGH (ref 65–99)
Glucose-Capillary: 107 mg/dL — ABNORMAL HIGH (ref 65–99)
Glucose-Capillary: 119 mg/dL — ABNORMAL HIGH (ref 65–99)

## 2017-09-23 LAB — PROCALCITONIN: PROCALCITONIN: 0.4 ng/mL

## 2017-09-23 NOTE — Progress Notes (Signed)
PROGRESS NOTE    Sutter J Durel Salts.  OZD:[id number] DOB: 1951-08-14 DOA: 09/18/2017 PCP: System, Pcp Not In     Brief Narrative:  Craig Oneal. is a 67 y.o. 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 Review of records from the nursing facility shows that on January 11 he grew out a multidrug resistant E. coli sensitive to meropenem.  He also had a sputum culture positive for Serratia marcescens and Moraxella catarrhalis with heavy growth.  He also had light growth of Pseudomonas which may be a colonizer for him. Patient was admitted for further treatment.   09/23/17: Patient seen and examined with no family members at his bedside. Rales noted from across the bed. Denies dyspnea on O2 supplement. Denies any chest pain or palpitations. On tube feeding with normal bowel sounds.  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 ([place])   Tracheostomy status (HCC)   Acute on chronic respiratory failure with hypoxemia (HCC)   Ventilator dependence ([place])   Bilateral hydronephrosis   Chronic indwelling Foley catheter   Acute posthemorrhagic anemia   E-coli UTI   Pneumonia due to Serratia marcescens (HCC)   Moraxella catarrhalis  pneumonia (HCC)   Other cirrhosis of liver (HCC)   Chronic bilateral pleural effusions   Gastrostomy tube dependent ([place])   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 ProCalcitonin 2.65 Also has a hx of previous VRE UTI -wbc 12.4 from 14.2 -CBC am  Acute on chronic hypoxemic respiratory failure  -Patient with chronic respiratory failure requiring ventilator at night, trach collar during the day -Continue vent support, PCCM following  -CXR with fluid overload, continue gentle diuresis with IV Lasix -Return to [facility] for remains off ventilatory support for 48 hours.  Acute on chronic systolic CHF  -Recent echo 07/2017 showed EF 40-45%, global hypokinesis -Continue IV lasix , blood pressure soft and monitor renal function -Daily weight, strict I/Os  -urine output 700 cc in the last 24 hours  Decubitus ulcer of right ischial area stage IV POA -Wound RN consulted   Chronic atrial fibrillation  -Currently rate controlled, continue diltiazem, lopressor  -Holding coumadin for now due to anemia/cirrhosis , status post receiving vitamin K Resume Coumadin prior to discharge and let [facility] monitor INR on a daily basis  Acute posthemorrhagic anemia on chronic anemia of chronic disease -Baseline Hgb around 8 , currently 7.8 -Patient with significant hematuria due to catheter balloon being in his ureter.  Will continue  to hold his anticoagulation. Transfused 2u pRBC  1/14, hemoglobin improved from 6.9> 8.3>7.8>9.0 continue to follow Continue PPI for GI prophylaxis  Cerebellar atrophy and  paraplegia, neurogenic bladder, dysphagia  -Bedbound, on trach-vent, PEG, chronic foley   Suspected Cirrhosis of the liver with elevated liver enzymes (CT abd/pelvis w contrast 09/18/17) -No known history of liver disease, although he has had chronically elevated alk phos as high as 255 in 2018 as well as AST 59. Now presenting with Alk phos [phone] >1446  AST 197>160>118>98  ,  ALT 382>273. >225>186 Total bilirubin 1.3, ammonia 62, started on lactulose -Abdominal US revealed gallstones and sludge with small amount pericholecystic fluid, as well as cirrhosis -hepatitis panel negative, GGT 404.  =Platelets are within normal limit 264 >199 -Hold lipitor and tylenol  -Trend LFT -Gastroenterology consulted for further evaluation  Hypothyroidism -Continue synthroid   PAD s/p AKA of RLE -Hold coumadin in setting of anemia. Hematuria resolved   Full thickness split of penis -Noted last admission 12/13, outpatient urology with Dr. Gloriann Loan planned    DVT prophylaxis: SCDs Code Status: Full Family Communication: No family at bedside Disposition Plan: Continue stepdown, anticipate discharge in 1-2 days   Consultants:   ID   Pulmonary  Procedures:   None   Antimicrobials:  Anti-infectives (From admission, onward)   Start     Dose/Rate Route Frequency Ordered Stop   09/21/17 1300  ciprofloxacin (CIPRO) 500 MG/5ML (10%) suspension 750 mg     750 mg Oral 2 times daily 09/21/17 0942 09/28/17 1019   09/20/17 1600  ciprofloxacin (CIPRO) IVPB 400 mg  Status:  Discontinued     400 mg 200 mL/hr over 60 Minutes Intravenous Every 8 hours 09/20/17 0958 09/21/17 0942   09/20/17 0830  ciprofloxacin (CIPRO) IVPB 400 mg  Status:  Discontinued     400 mg 200 mL/hr over 60 Minutes Intravenous Every 12 hours 09/20/17 0744 09/20/17 0958   09/19/17 1000  ciprofloxacin (CIPRO) 500 MG/5ML (10%) suspension 750 mg  Status:  Discontinued     750 mg Oral 2 times daily 09/19/17 0904 09/20/17 0744     09/19/17 0900  ciprofloxacin (CIPRO) 500 MG/5ML (10%) suspension 500 mg  Status:  Discontinued     500 mg Oral 2 times daily 09/19/17 0857 09/19/17 0904   09/19/17 0600  vancomycin (VANCOCIN) IVPB 1000 mg/200 mL premix  Status:  Discontinued     1,000 mg 200 mL/hr over 60 Minutes Intravenous Every 24 hours 09/18/17 0503 09/18/17 0855   09/19/17 0500  ceFEPIme (MAXIPIME) 2 g in dextrose 5 % 50 mL IVPB  Status:  Discontinued     2 g 100 mL/hr over 30 Minutes Intravenous Every 24 hours 09/18/17 0503 09/18/17 0607   09/18/17 1400  meropenem (MERREM) 1 g in sodium chloride 0.9 % 100 mL IVPB  Status:  Discontinued     1 g 200 mL/hr over 30 Minutes Intravenous Every 8 hours 09/18/17 0627 09/22/17 1006   09/18/17 0415  ceFEPIme (MAXIPIME) 2 g in dextrose 5 % 50 mL IVPB     2 g 100 mL/hr over 30 Minutes Intravenous  Once 09/18/17 0405 09/18/17 0443   09/18/17 0415  vancomycin (VANCOCIN) IVPB 1000 mg/200 mL premix  Status:  Discontinued     1,000 mg 200 mL/hr over 60 Minutes Intravenous  Once 09/18/17 0405 09/18/17 0411   09/18/17 0415  vancomycin (VANCOCIN) 1,500 mg in sodium chloride 0.9 % 500 mL IVPB     1,500 mg 250  mL/hr over 120 Minutes Intravenous  Once 09/18/17 0411 09/18/17 0646       Subjective: Patient   vent dependent, now on the vent for the last 24 hours. Otherwise well, answers questions by nodding his head  Objective: Vitals:   09/22/17 2353 09/23/17 0317 09/23/17 0325 09/23/17 0357  BP: 125/65  (!) 102/59   Pulse: 71  75   Resp: (!) 23  16   Temp:    98.2 F (36.8 C)  TempSrc:    Oral  SpO2: 98%  98%   Weight:  75.5 kg (166 lb 7.2 oz)  76 kg (167 lb 8.8 oz)  Height:        Intake/Output Summary (Last 24 hours) at 09/23/2017 0736 Last data filed at 09/23/2017 0600 Gross per 24 hour  Intake 1176 ml  Output 2700 ml  Net -1524 ml   Filed Weights   09/22/17 0124 09/23/17 0317 09/23/17 0357  Weight: 78.1 kg (172 lb 2.9 oz) 75.5 kg (166 lb 7.2 oz) 76 kg (167 lb 8.8  oz)    Examination:  General exam: Appears calm, on trach-vent  Respiratory system: On trach-vent  Cardiovascular system: S1 & S2 heard. No JVD, murmurs, rubs, gallops or clicks. No pedal edema. Gastrointestinal system: Abdomen is nondistended, soft and nontender. Normal bowel sounds heard. +PEG  Central nervous system: Alert, able to follow simple commands but remains paraplegic with minimal movement of left hand  Extremities: Right AKA   Data Reviewed: I have personally reviewed following labs and imaging studies  CBC: Recent Labs  Lab 09/18/17 0347 09/18/17 1309  09/19/17 0200  09/19/17 1400 09/19/17 2332 09/20/17 0225 09/21/17 0350 09/23/17 0315  WBC 11.4* 11.6*  --  11.3*  --   --   --  12.7* 14.2* 12.4*  NEUTROABS 9.1* 9.0*  --   --   --   --   --   --   --   --   HGB 7.7* 7.3*   < > 6.9*   < > 8.0* 8.0* 8.3* 7.8* 9.0*  HCT 26.8* 25.7*  --  23.4*  --  26.6*  --  27.0* 26.2* 31.0*  MCV 80.7 80.3  --  79.9  --   --   --  80.1 81.9 83.6  PLT 262 264  --  242  --   --   --  239 199 244   < > = values in this interval not displayed.   Basic Metabolic Panel: Recent Labs  Lab 09/18/17 1309 09/19/17 0200 09/19/17 0758 09/20/17 0225 09/21/17 0350 09/22/17 0341  NA 142 143  --  146* 143 142  K 4.4 4.3  --  4.5 4.4 4.5  CL 101 104  --  104 102 102  CO2 29 31  --  31 31 30   GLUCOSE 76 110*  --  109* 122* 114*  BUN 41* 44*  --  38* 31* 30*  CREATININE 0.81 0.87  --  0.81 0.82 0.75  CALCIUM 9.2 9.0  --  8.8* 8.6* 8.4*  MG  --   --  2.1  --  1.7  --   PHOS  --   --   --   --  3.2  --    GFR: Estimated Creatinine Clearance: 96.3 mL/min (by C-G formula based on SCr of 0.75 mg/dL). Liver Function Tests: Recent Labs  Lab 09/18/17 1309 09/19/17 0200 09/20/17 0225 09/21/17 0350 09/22/17 0341  AST 174* 152* 160* 118* 98*  ALT 352* 297* 273* 225* 186*  ALKPHOS 1,379* 1,573* 1,794* 1,586* 1,446*  BILITOT 2.6* 1.7* 1.5* 1.3* 1.0  PROT 8.0 7.1 7.4 7.3 7.4  ALBUMIN 1.9*  1.8* 1.9* 1.8* 1.9*   No results for input(s): LIPASE, AMYLASE in the last 168 hours. Recent Labs  Lab 09/21/17 1150  AMMONIA 62*   Coagulation Profile: Recent Labs  Lab 09/18/17 0347 09/18/17 1309 09/19/17 0200 09/20/17 0225 09/23/17 0315  INR 2.00 1.63 1.48 1.23 1.05   Cardiac Enzymes: No results for input(s): CKTOTAL, CKMB, CKMBINDEX, TROPONINI in the last 168 hours. BNP (last 3 results) No results for input(s): PROBNP in the last 8760 hours. HbA1C: No results for input(s): HGBA1C in the last 72 hours. CBG: Recent Labs  Lab 09/21/17 2327 09/22/17 0345 09/22/17 1157 09/22/17 2317 09/23/17 0457  GLUCAP 98 104* 104* 105* 101*   Lipid Profile: No results for input(s): CHOL, HDL, LDLCALC, TRIG, CHOLHDL, LDLDIRECT in the last 72 hours. Thyroid Function Tests: No results for input(s): TSH, T4TOTAL, FREET4, T3FREE, THYROIDAB in the last 72 hours. Anemia Panel: No results for input(s): VITAMINB12, FOLATE, FERRITIN, TIBC, IRON, RETICCTPCT in the last 72 hours. Sepsis Labs: Recent Labs  Lab 09/18/17 0414 09/18/17 0616 09/18/17 0855 09/18/17 1309 09/21/17 1150 09/22/17 0341 09/23/17 0315  PROCALCITON  --   --   --  1.60 0.61 2.65 0.40  LATICACIDVEN 0.62 0.7 0.7 0.9  --   --   --     Recent Results (from the past 240 hour(s))  Urine Culture     Status: None   Collection Time: 09/18/17  3:37 AM  Result Value Ref Range Status   Specimen Description URINE, RANDOM  Final   Special Requests NONE  Final   Culture NO GROWTH  Final   Report Status 09/19/2017 FINAL  Final  Blood culture (routine x 2)     Status: None (Preliminary result)   Collection Time: 09/18/17  3:58 AM  Result Value Ref Range Status   Specimen Description BLOOD RIGHT HAND  Final   Special Requests   Final    BOTTLES DRAWN AEROBIC AND ANAEROBIC Blood Culture adequate volume   Culture NO GROWTH 4 DAYS  Final   Report Status PENDING  Incomplete  Blood culture (routine x 2)     Status: None  (Preliminary result)   Collection Time: 09/18/17  4:03 AM  Result Value Ref Range Status   Specimen Description BLOOD LEFT HAND  Final   Special Requests   Final    BOTTLES DRAWN AEROBIC AND ANAEROBIC Blood Culture adequate volume   Culture NO GROWTH 4 DAYS  Final   Report Status PENDING  Incomplete  MRSA PCR Screening     Status: None   Collection Time: 09/18/17 12:20 PM  Result Value Ref Range Status   MRSA by PCR NEGATIVE NEGATIVE Final    Comment:        The GeneXpert MRSA Assay (FDA approved for NASAL specimens only), is one component of a comprehensive MRSA colonization surveillance program. It is not intended to diagnose MRSA infection nor to guide or monitor treatment for MRSA infections.   Culture, respiratory (NON-Expectorated)     Status: None   Collection Time: 09/19/17  2:11 PM  Result Value Ref Range Status   Specimen Description TRACHEAL ASPIRATE  Final   Special Requests NONE  Final   Gram Stain   Final    ABUNDANT WBC PRESENT,BOTH PMN AND MONONUCLEAR ABUNDANT GRAM NEGATIVE RODS MODERATE SQUAMOUS EPITHELIAL CELLS  PRESENT    Culture MODERATE PSEUDOMONAS AERUGINOSA  Final   Report Status 09/22/2017 FINAL  Final   Organism ID, Bacteria PSEUDOMONAS AERUGINOSA  Final      Susceptibility   Pseudomonas aeruginosa - MIC*    CEFTAZIDIME 16 INTERMEDIATE Intermediate     CIPROFLOXACIN 1 SENSITIVE Sensitive     GENTAMICIN <=1 SENSITIVE Sensitive     IMIPENEM >=16 RESISTANT Resistant     CEFEPIME 16 INTERMEDIATE Intermediate     * MODERATE PSEUDOMONAS AERUGINOSA       Radiology Studies: No results found.    Scheduled Meds: . baclofen  5 mg Per Tube BID  . chlorhexidine gluconate (MEDLINE KIT)  15 mL Mouth Rinse BID  . ciprofloxacin  750 mg Oral BID  . collagenase   Topical Daily  . Darbepoetin Alfa  25 mcg Subcutaneous Q28 days  . diltiazem  30 mg Per Tube Q6H  . fluticasone  2 spray Each Nare Daily  . free water  200 mL Per Tube Q8H  . furosemide  20  mg Intravenous Daily  . glycopyrrolate  1 mg Per Tube BID  . ipratropium-albuterol  3 mL Nebulization TID  . lactulose  10 g Oral TID  . levothyroxine  125 mcg Per Tube QAC breakfast  . metoprolol tartrate  12.5 mg Per Tube BID  . montelukast  10 mg Per Tube QHS  . multivitamin with minerals  1 tablet Per Tube Daily  . pantoprazole sodium  40 mg Per Tube Daily  . polyethylene glycol  17 g Oral Daily  . sodium chloride flush  10-40 mL Intracatheter Q12H  . sodium chloride flush  3 mL Intravenous Q12H  . Vitamin D (Ergocalciferol)  50,000 Units Per Tube Q Fri   Continuous Infusions: . feeding supplement (VITAL AF 1.2 CAL) 1,000 mL (09/22/17 2100)     LOS: 5 days    Time spent: 60 minutes   Kayleen Memos, MD  [facility] [email or web address] Password [facility] 09/23/2017, 7:36 AM

## 2017-09-23 NOTE — NC FL2 (Signed)
[place] LEVEL OF CARE SCREENING TOOL     IDENTIFICATION  Patient Name: Craig Oneal. Birthdate: February 11, 1951 Sex: male Admission Date (Current Location): 09/18/2017  [facility] and [place] Number:  Herbalist and Address:  [facility], [place] [address] [place], [place] [address]      Provider Number: [id number]  Attending Physician Name and Address:  Kayleen Memos, DO  Relative Name and Phone Number:       Current Level of Care: SNF Recommended Level of Care: [place] Prior Approval Number:    Date Approved/Denied:   PASRR Number:    Discharge Plan: SNF    Current Diagnoses: Patient Active Problem List   Diagnosis Date Noted  . UTI (urinary tract infection) 09/18/2017  . E-coli UTI 09/18/2017  . Pneumonia due to Serratia marcescens ([place]) 09/18/2017  . Moraxella catarrhalis pneumonia ([place]) 09/18/2017  . Other cirrhosis of liver ([place]) 09/18/2017  . Chronic bilateral pleural effusions 09/18/2017  . Gastrostomy tube dependent ([place]) 09/18/2017  . 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 Cerebellar atrophy 09/18/2017  . Sepsis ([place]) 09/18/2017  . Atherosclerosis of aortic bifurcation and common iliac arteries ([place]) 09/18/2017  . Bilateral hydronephrosis   . Carbapenem-resistant Enterobacteriaceae infection   . Candida glabrata infection   . Chronic systolic CHF (congestive heart failure) ([place])   . Pulmonary hypertension ([place])   . Atrial fibrillation, chronic ([place])   . Acute renal failure superimposed on stage 3 chronic kidney disease ([place])   . Hypernatremia   . Hypokalemia   . Hypomagnesemia   . Chronic indwelling Foley catheter   . Acute posthemorrhagic anemia   . S/P AKA (above knee amputation) unilateral, right ([place])   . Penile laceration   . Ventilator dependence ([place])   . Candidemia ([place])   . Tracheostomy status ([place])   . Hypoxemia   . Acute on chronic respiratory failure  with hypoxemia ([place])   . Acute respiratory failure ([place])   . 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 Acute encephalopathy 08/01/2017  . HCAP (healthcare-associated pneumonia)   . Hyperkalemia   . Acute kidney injury ([place])   . Atrial fibrillation with rapid ventricular response ([place])   . Acute pulmonary edema (HCC)   . Decubitus ulcer of right ischial area, stage IV ([place]) 01/23/2017    Orientation RESPIRATION BLADDER Height & Weight     Time, Self, Situation, Place  Tracheostomy, O2(88m portex cuffed) Incontinent, Indwelling catheter Weight: 167 lb 8.8 oz (76 kg) Height:  5' 10"  (177.8 cm)  BEHAVIORAL SYMPTOMS/MOOD NEUROLOGICAL BOWEL NUTRITION STATUS      Incontinent Feeding tube  AMBULATORY STATUS COMMUNICATION OF NEEDS Skin   Total Care Verbally PU Stage and Appropriate Care       PU Stage 4 Dressing: Daily(located on ischial tuberisty; gauze dressing changed after pt defecates)               Personal Care Assistance Level of Assistance  Bathing, Dressing, Feeding Bathing Assistance: Maximum assistance Feeding assistance: Maximum assistance Dressing Assistance: Maximum assistance     Functional Limitations Info             SPECIAL CARE FACTORS FREQUENCY                       Contractures      Additional Factors Info  Code Status, Allergies, Isolation Precautions Code Status Info:  FULL Allergies Info: NKA     Isolation Precautions Info: VRE     Current Medications (09/23/2017):  This is the current hospital active medication list Current Facility-Administered Medications  Medication Dose Route Frequency Provider Last Rate Last Dose  . baclofen (LIORESAL) tablet 5 mg  5 mg Per Tube BID Lady Deutscher, MD   5 mg at 09/22/17 2135  . chlorhexidine gluconate (MEDLINE KIT) (PERIDEX) 0.12 % solution 15 mL  15 mL Mouth Rinse BID Lady Deutscher, MD   15 mL at 09/23/17 0814  . ciprofloxacin (CIPRO) 500 MG/5ML (10%)  suspension 750 mg  750 mg Oral BID Thayer Headings, MD   750 mg at 09/23/17 0811  . collagenase (SANTYL) ointment   Topical Daily Dessa Phi, DO      . Darbepoetin Alfa (ARANESP) injection 25 mcg  25 mcg Subcutaneous Q28 days Lady Deutscher, MD   25 mcg at 09/20/17 1224  . diltiazem (CARDIZEM) 10 mg/ml oral suspension 30 mg  30 mg Per Tube Q6H Lady Deutscher, MD   30 mg at 09/23/17 0500  . feeding supplement (VITAL AF 1.2 CAL) liquid 1,000 mL  1,000 mL Per Tube Continuous Lady Deutscher, MD 70 mL/hr at 09/22/17 2100 1,000 mL at 09/22/17 2100  . fluticasone (FLONASE) 50 MCG/ACT nasal spray 2 spray  2 spray Each Nare Daily Lady Deutscher, MD   2 spray at 09/22/17 1000  . free water 200 mL  200 mL Per Tube Q8H Lady Deutscher, MD   200 mL at 09/23/17 2992  . furosemide (LASIX) injection 20 mg  20 mg Intravenous Daily Dessa Phi, DO   20 mg at 09/22/17 0959  . glycopyrrolate (ROBINUL) tablet 1 mg  1 mg Per Tube BID Lady Deutscher, MD   1 mg at 09/22/17 2137  . ipratropium-albuterol (DUONEB) 0.5-2.5 (3) MG/3ML nebulizer solution 3 mL  3 mL Nebulization TID Lady Deutscher, MD   3 mL at 09/23/17 0803  . lactulose (CHRONULAC) 10 GM/15ML solution 10 g  10 g Oral TID Reyne Dumas, MD   10 g at 09/22/17 2136  . levothyroxine (SYNTHROID, LEVOTHROID) tablet 125 mcg  125 mcg Per Tube QAC breakfast Lady Deutscher, MD   125 mcg at 09/23/17 [phone]  . metoprolol tartrate (LOPRESSOR) tablet 12.5 mg  12.5 mg Per Tube BID Lady Deutscher, MD   12.5 mg at 09/22/17 2136  . montelukast (SINGULAIR) tablet 10 mg  10 mg Per Tube QHS Lady Deutscher, MD   10 mg at 09/22/17 2136  . multivitamin with minerals tablet 1 tablet  1 tablet Per Tube Daily Susa Raring, [facility]   1 tablet at 09/22/17 2136  . ondansetron (ZOFRAN) tablet 4 mg  4 mg Oral Q6H PRN Lady Deutscher, MD       Or  . ondansetron [facility]) injection 4 mg  4 mg Intravenous Q6H PRN Lady Deutscher, MD      .  oxyCODONE (Oxy IR/ROXICODONE) immediate release tablet 5 mg  5 mg Per J Tube Q4H PRN Lady Deutscher, MD      . pantoprazole sodium (PROTONIX) 40 mg/20 mL oral suspension 40 mg  40 mg Per Tube Daily Lady Deutscher, MD   40 mg at 09/22/17 0959  . polyethylene glycol (MIRALAX / GLYCOLAX) packet 17 g  17 g Oral Daily Lady Deutscher, MD   17 g at 09/22/17 0957  . simethicone (MYLICON) 40 TM/1.9QQ  suspension 40 mg  40 mg Per Tube Q6H PRN Lady Deutscher, MD   40 mg at 09/21/17 7681  . sodium chloride (OCEAN) 0.65 % nasal spray 1 spray  1 spray Each Nare PRN Reyne Dumas, MD   1 spray at 09/20/17 1511  . sodium chloride flush (NS) 0.9 % injection 10-40 mL  10-40 mL Intracatheter Q12H Dessa Phi, DO   10 mL at 09/22/17 2129  . sodium chloride flush (NS) 0.9 % injection 10-40 mL  10-40 mL Intracatheter PRN Dessa Phi, DO      . sodium chloride flush (NS) 0.9 % injection 3 mL  3 mL Intravenous Q12H Lady Deutscher, MD   3 mL at 09/23/17 1572  . Vitamin D (Ergocalciferol) (DRISDOL) capsule 50,000 Units  50,000 Units Per Tube Q Osvaldo Shipper, MD         Discharge Medications: Please see discharge summary for a list of discharge medications.  Relevant Imaging Results:  Relevant Lab Results:   Additional Information [id number]  Jorge Ny, [place]

## 2017-09-23 NOTE — Care Management Note (Addendum)
Case Management Note  Patient Details  Name: Craig DiegoCharlie J Scalia Jr. MRN: [id number] Date of Birth: 06-09-1951  Subjective/Objective:   From [facility], presents with Sepsis, acute/chronic resp failure with pseudomonas pna, candida albicans fungemia, chronic chf.    1/19 1205 Craig Capeeborah Senetra Dillin RN, BSN - patient is out of Medicare days, and only has Medicaid to work with, [facility] [place]                                     Action/Plan: NCM will follow along with CSW for dc needs.   Expected Discharge Date:  09/21/17               Expected Discharge Plan:     In-House Referral:     Discharge planning Services  CM Consult  Post Acute Care Choice:    Choice offered to:     DME Arranged:    DME Agency:     HH Arranged:    HH Agency:     Status of Service:  In process, will continue to follow  If discussed at Long Length of Stay Meetings, dates discussed:    Additional Comments:  Craig Oneal, Craig Robideau [place], RN 09/23/2017, 5:58 PM

## 2017-09-24 DIAGNOSIS — R945 Abnormal results of liver function studies: Secondary | ICD-10-CM

## 2017-09-24 DIAGNOSIS — G319 Degenerative disease of nervous system, unspecified: Secondary | ICD-10-CM | POA: Diagnosis not present

## 2017-09-24 DIAGNOSIS — N390 Urinary tract infection, site not specified: Secondary | ICD-10-CM | POA: Diagnosis not present

## 2017-09-24 DIAGNOSIS — J189 Pneumonia, unspecified organism: Secondary | ICD-10-CM | POA: Diagnosis not present

## 2017-09-24 DIAGNOSIS — L89314 Pressure ulcer of right buttock, stage 4: Secondary | ICD-10-CM | POA: Diagnosis not present

## 2017-09-24 DIAGNOSIS — J156 Pneumonia due to other aerobic Gram-negative bacteria: Secondary | ICD-10-CM | POA: Diagnosis not present

## 2017-09-24 DIAGNOSIS — G119 Hereditary ataxia, unspecified: Secondary | ICD-10-CM | POA: Diagnosis not present

## 2017-09-24 DIAGNOSIS — D62 Acute posthemorrhagic anemia: Secondary | ICD-10-CM | POA: Diagnosis not present

## 2017-09-24 DIAGNOSIS — K7469 Other cirrhosis of liver: Secondary | ICD-10-CM | POA: Diagnosis not present

## 2017-09-24 DIAGNOSIS — Z931 Gastrostomy status: Secondary | ICD-10-CM | POA: Diagnosis not present

## 2017-09-24 DIAGNOSIS — J9621 Acute and chronic respiratory failure with hypoxia: Secondary | ICD-10-CM | POA: Diagnosis not present

## 2017-09-24 DIAGNOSIS — I4891 Unspecified atrial fibrillation: Secondary | ICD-10-CM | POA: Diagnosis not present

## 2017-09-24 LAB — GLUCOSE, CAPILLARY
GLUCOSE-CAPILLARY: 93 mg/dL (ref 65–99)
GLUCOSE-CAPILLARY: 97 mg/dL (ref 65–99)
Glucose-Capillary: 94 mg/dL (ref 65–99)

## 2017-09-24 MED ORDER — WARFARIN SODIUM 5 MG PO TABS
10.0000 mg | ORAL_TABLET | Freq: Once | ORAL | Status: AC
  Administered 2017-09-24: 10 mg via ORAL
  Filled 2017-09-24: qty 2

## 2017-09-24 MED ORDER — WARFARIN - PHARMACIST DOSING INPATIENT: Freq: Every day | Status: DC

## 2017-09-24 NOTE — Progress Notes (Signed)
PROGRESS NOTE    Craig Oneal.  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  Patient was on Coumadin with supratherapeutic INR therefore was given vitamin K.  He is urinary growing multidrug-resistant E. coli sensitive to meropenem.  Sputum culture did grow Serratia marcescens and Moraxella catarrhalis.  Patient was admitted here for further care and infectious disease was consulted.   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 coli urinary tract infection -At this time continue ciprofloxacin until January 22 -Continue provide supportive care.  Appreciate infectious disease input  Acute on  chronic hypoxic respiratory failure -Likely secondary to pneumonia, he is improved quite a bit.  Did not use month support last night.  We will continue to monitor him for another day to see we can get back to his baseline and likely discharge him tomorrow.  Mild acute on chronic systolic CHF, stage II -Echocardiogram couple months ago showed ejection fraction 40-45%.  Patient has been diuresed with IV Lasix.  Now appears to be back to his baseline.  Will closely monitor.  Daily weight and strict input and output  History of paroxysmal atrial fibrillation -Was on Coumadin but stopped due to elevated INR and hematuria which is resolved.  Will restart his Coumadin. -Currently he is rate controlled with calcium channel blockers  Acute posthemorrhagic anemia Anemia chronic disease -Hemoglobin appears to be stable, Foley bag appears to have clear urine without any signs of hematuria -We will restart patient's Coumadin.  Quadriplegia Cerebral atrophy, neurogenic bladder and dysphagia -Continue tracheostomy care, PEG tube care and Foley.  Right ischial stage IV ulcer -Continue wound care  Cirrhosis of unknown etiology Transaminitis -ALP is quite elevated.  Gastroenterology consulted -Abdominal ultrasound shows gallstones with sludge. - Statin and Tylenol on hold - Case was discussed by Dr. Allyson Sabal with Dr Collene Mares who felt this was multifactorial in nature given sepsis/shock liver.  We will continue to trend LFTs, but if this trends up then he will need formal GI evaluation.  At this time patient is not a candidate given ventilatory dependence.   DVT prophylaxis: Coumadin is to be restarted today Code Status: Full code Family Communication: At bedside Disposition Plan: Likely discharge back to [facility] in next 24-48 hours  Consultants:   Infectious disease  Pulmonary  critical care  Procedures:   None     Subjective: No acute events overnight.  He was able to tolerate being on trach  collar overnight.  Secretions has decreased.  Objective: Vitals:   09/24/17 0001 09/24/17 0340 09/24/17 0411 09/24/17 0700  BP: (!) 102/57 113/63 106/60   Pulse: 94 87    Resp: (!) 21 17    Temp:   98 F (36.7 C) 98.1 F (36.7 C)  TempSrc:   Oral Oral  SpO2: 98% 94%    Weight:   76.1 kg (167 lb 12.3 oz)   Height:        Intake/Output Summary (Last 24 hours) at 09/24/2017 0757 Last data filed at 09/24/2017 0600 Gross per 24 hour  Intake 1211 ml  Output 1926 ml  Net -715 ml   Filed Weights   09/23/17 0317 09/23/17 0357 09/24/17 0411  Weight: 75.5 kg (166 lb 7.2 oz) 76 kg (167 lb 8.8 oz) 76.1 kg (167 lb 12.3 oz)    Examination:  General exam: Appears calm and comfortable; trach collar in place Respiratory system: Mild diffuse coarse breath sounds Cardiovascular system: S1 & S2 heard, RRR. No JVD, murmurs, rubs, gallops or clicks. No pedal edema. Gastrointestinal system: Abdomen is nondistended, soft and nontender. No organomegaly or masses felt. Normal bowel sounds heard.  PEG tube in place Central nervous system: Alert and oriented. No focal neurological deficits. Extremities: Contracted extremities Skin: Right ischial ulcer stage IV-dressing in place Psychiatry: Judgement and insight appear normal. Mood & affect appropriate.  Foley catheter in place without any signs of hematuria.    Data Reviewed:   CBC: Recent Labs  Lab 09/18/17 0347 09/18/17 1309  09/19/17 0200  09/19/17 1400 09/19/17 2332 09/20/17 0225 09/21/17 0350 09/23/17 0315  WBC 11.4* 11.6*  --  11.3*  --   --   --  12.7* 14.2* 12.4*  NEUTROABS 9.1* 9.0*  --   --   --   --   --   --   --   --   HGB 7.7* 7.3*   < > 6.9*   < > 8.0* 8.0* 8.3* 7.8* 9.0*  HCT 26.8* 25.7*  --  23.4*  --  26.6*  --  27.0* 26.2* 31.0*  MCV 80.7 80.3  --  79.9  --   --   --  80.1 81.9 83.6  PLT 262 264  --  242  --   --   --  239 199 244   < > = values in this interval not displayed.   Basic Metabolic Panel: Recent Labs    Lab 09/18/17 1309 09/19/17 0200 09/19/17 0758 09/20/17 0225 09/21/17 0350 09/22/17 0341  NA 142 143  --  146* 143 142  K 4.4 4.3  --  4.5 4.4 4.5  CL 101 104  --  104 102 102  CO2 29 31  --  31 31 30   GLUCOSE 76 110*  --  109* 122* 114*  BUN 41* 44*  --  38* 31* 30*  CREATININE 0.81 0.87  --  0.81 0.82 0.75  CALCIUM 9.2 9.0  --  8.8* 8.6* 8.4*  MG  --   --  2.1  --  1.7  --   PHOS  --   --   --   --  3.2  --    GFR: Estimated Creatinine Clearance: 96.3 mL/min (by C-G formula based on SCr of 0.75 mg/dL). Liver Function Tests: Recent Labs  Lab 09/18/17  1309 09/19/17 0200 09/20/17 0225 09/21/17 0350 09/22/17 0341  AST 174* 152* 160* 118* 98*  ALT 352* 297* 273* 225* 186*  ALKPHOS 1,379* 1,573* 1,794* 1,586* 1,446*  BILITOT 2.6* 1.7* 1.5* 1.3* 1.0  PROT 8.0 7.1 7.4 7.3 7.4  ALBUMIN 1.9* 1.8* 1.9* 1.8* 1.9*   No results for input(s): LIPASE, AMYLASE in the last 168 hours. Recent Labs  Lab 09/21/17 1150  AMMONIA 62*   Coagulation Profile: Recent Labs  Lab 09/18/17 0347 09/18/17 1309 09/19/17 0200 09/20/17 0225 09/23/17 0315  INR 2.00 1.63 1.48 1.23 1.05   Cardiac Enzymes: No results for input(s): CKTOTAL, CKMB, CKMBINDEX, TROPONINI in the last 168 hours. BNP (last 3 results) No results for input(s): PROBNP in the last 8760 hours. HbA1C: No results for input(s): HGBA1C in the last 72 hours. CBG: Recent Labs  Lab 09/22/17 2317 09/23/17 0457 09/23/17 0755 09/23/17 2358 09/24/17 0636  GLUCAP 105* 101* 114* 119* 94   Lipid Profile: No results for input(s): CHOL, HDL, LDLCALC, TRIG, CHOLHDL, LDLDIRECT in the last 72 hours. Thyroid Function Tests: No results for input(s): TSH, T4TOTAL, FREET4, T3FREE, THYROIDAB in the last 72 hours. Anemia Panel: No results for input(s): VITAMINB12, FOLATE, FERRITIN, TIBC, IRON, RETICCTPCT in the last 72 hours. Sepsis Labs: Recent Labs  Lab 09/18/17 0414 09/18/17 0616 09/18/17 0855 09/18/17 1309 09/21/17 1150  09/22/17 0341 09/23/17 0315  PROCALCITON  --   --   --  1.60 0.61 2.65 0.40  LATICACIDVEN 0.62 0.7 0.7 0.9  --   --   --     Recent Results (from the past 240 hour(s))  Urine Culture     Status: None   Collection Time: 09/18/17  3:37 AM  Result Value Ref Range Status   Specimen Description URINE, RANDOM  Final   Special Requests NONE  Final   Culture NO GROWTH  Final   Report Status 09/19/2017 FINAL  Final  Blood culture (routine x 2)     Status: None   Collection Time: 09/18/17  3:58 AM  Result Value Ref Range Status   Specimen Description BLOOD RIGHT HAND  Final   Special Requests   Final    BOTTLES DRAWN AEROBIC AND ANAEROBIC Blood Culture adequate volume   Culture NO GROWTH 5 DAYS  Final   Report Status 09/23/2017 FINAL  Final  Blood culture (routine x 2)     Status: None   Collection Time: 09/18/17  4:03 AM  Result Value Ref Range Status   Specimen Description BLOOD LEFT HAND  Final   Special Requests   Final    BOTTLES DRAWN AEROBIC AND ANAEROBIC Blood Culture adequate volume   Culture NO GROWTH 5 DAYS  Final   Report Status 09/23/2017 FINAL  Final  MRSA PCR Screening     Status: None   Collection Time: 09/18/17 12:20 PM  Result Value Ref Range Status   MRSA by PCR NEGATIVE NEGATIVE Final    Comment:        The GeneXpert MRSA Assay (FDA approved for NASAL specimens only), is one component of a comprehensive MRSA colonization surveillance program. It is not intended to diagnose MRSA infection nor to guide or monitor treatment for MRSA infections.   Culture, respiratory (NON-Expectorated)     Status: None   Collection Time: 09/19/17  2:11 PM  Result Value Ref Range Status   Specimen Description TRACHEAL ASPIRATE  Final   Special Requests NONE  Final   Gram Stain   Final  ABUNDANT WBC PRESENT,BOTH PMN AND MONONUCLEAR ABUNDANT GRAM NEGATIVE RODS MODERATE SQUAMOUS EPITHELIAL CELLS PRESENT    Culture MODERATE PSEUDOMONAS AERUGINOSA  Final   Report Status  09/22/2017 FINAL  Final   Organism ID, Bacteria PSEUDOMONAS AERUGINOSA  Final      Susceptibility   Pseudomonas aeruginosa - MIC*    CEFTAZIDIME 16 INTERMEDIATE Intermediate     CIPROFLOXACIN 1 SENSITIVE Sensitive     GENTAMICIN <=1 SENSITIVE Sensitive     IMIPENEM >=16 RESISTANT Resistant     CEFEPIME 16 INTERMEDIATE Intermediate     * MODERATE PSEUDOMONAS AERUGINOSA         Radiology Studies: No results found.      Scheduled Meds: . baclofen  5 mg Per Tube BID  . chlorhexidine gluconate (MEDLINE KIT)  15 mL Mouth Rinse BID  . ciprofloxacin  750 mg Oral BID  . collagenase   Topical Daily  . Darbepoetin Alfa  25 mcg Subcutaneous Q28 days  . diltiazem  30 mg Per Tube Q6H  . fluticasone  2 spray Each Nare Daily  . free water  200 mL Per Tube Q8H  . furosemide  20 mg Intravenous Daily  . glycopyrrolate  1 mg Per Tube BID  . ipratropium-albuterol  3 mL Nebulization TID  . lactulose  10 g Oral TID  . levothyroxine  125 mcg Per Tube QAC breakfast  . metoprolol tartrate  12.5 mg Per Tube BID  . montelukast  10 mg Per Tube QHS  . multivitamin with minerals  1 tablet Per Tube Daily  . pantoprazole sodium  40 mg Per Tube Daily  . polyethylene glycol  17 g Oral Daily  . sodium chloride flush  10-40 mL Intracatheter Q12H  . sodium chloride flush  3 mL Intravenous Q12H  . Vitamin D (Ergocalciferol)  50,000 Units Per Tube Q Fri   Continuous Infusions: . feeding supplement (VITAL AF 1.2 CAL) 1,000 mL (09/24/17 0600)     LOS: 6 days    Time spent: 35 mins     Kennedee Kitzmiller Arsenio Loader, MD [facility] Pager [phone]   If 7PM-7AM, please contact night-coverage [email or web address] Password [facility] 09/24/2017, 7:57 AM

## 2017-09-24 NOTE — Progress Notes (Signed)
CCM called reporting pt had a 6 beat run of vtach.  Assessed pt. Resting comfortably at this time.  BP 109/63, RR 26, HR 88 O2 sat is 100.  Will continue to monitor pt.

## 2017-09-24 NOTE — Progress Notes (Signed)
ANTICOAGULATION CONSULT NOTE - Initial Consult  Pharmacy Consult for warfarin Indication: atrial fibrillation  No Known Allergies  Patient Measurements: Height: _0  (177.8 cm) Weight: 167 lb 12.3 oz (76.1 kg) IBW/kg (Calculated) : 73   Vital Signs: Temp: 98.1 F (36.7 C) (01/19 0700) Temp Source: Oral (01/19 0700) BP: 106/60 (01/19 0411) Pulse Rate: 96 (01/19 0807)  Labs: Recent Labs    09/22/17 0341 09/23/17 0315  HGB  --  9.0*  HCT  --  31.0*  PLT  --  244  LABPROT  --  13.6  INR  --  1.05  CREATININE 0.75  --     Estimated Creatinine Clearance: 96.3 mL/min (by C-G formula based on SCr of 0.75 mg/dL).   Medical History: Past Medical History:  Diagnosis Date  . Cerebellar atrophy   . Chronic indwelling Foley catheter   . Chronic kidney disease (CKD), stage IV (severe) ([place])   . COPD (chronic obstructive pulmonary disease) ([place])   . Paraplegia ([place])   . Paroxysmal atrial fibrillation (HCC)   . PEG (percutaneous endoscopic gastrostomy) status ([place])   . Systolic heart failure ([place])   . Tracheostomy in place [facility])   . Unilateral AKA, right (HCC)     Medications:  Scheduled:  . baclofen  5 mg Per Tube BID  . chlorhexidine gluconate (MEDLINE KIT)  15 mL Mouth Rinse BID  . ciprofloxacin  750 mg Oral BID  . collagenase   Topical Daily  . Darbepoetin Alfa  25 mcg Subcutaneous Q28 days  . diltiazem  30 mg Per Tube Q6H  . fluticasone  2 spray Each Nare Daily  . free water  200 mL Per Tube Q8H  . furosemide  20 mg Intravenous Daily  . glycopyrrolate  1 mg Per Tube BID  . ipratropium-albuterol  3 mL Nebulization TID  . lactulose  10 g Oral TID  . levothyroxine  125 mcg Per Tube QAC breakfast  . metoprolol tartrate  12.5 mg Per Tube BID  . montelukast  10 mg Per Tube QHS  . multivitamin with minerals  1 tablet Per Tube Daily  . pantoprazole sodium  40 mg Per Tube Daily  . polyethylene glycol  17 g Oral Daily  . sodium chloride flush  10-40 mL Intracatheter  Q12H  . sodium chloride flush  3 mL Intravenous Q12H  . Vitamin D (Ergocalciferol)  50,000 Units Per Tube Q Fri  . warfarin  10 mg Oral ONCE-1800  . Warfarin - Pharmacist Dosing Inpatient   Does not apply q1800   Infusions:  . feeding supplement (VITAL AF 1.2 CAL) 1,000 mL (09/24/17 0600)    Assessment: [name], transferred from [facility] with blood-tinged urine in Foley. Per chart, INR was supratherapeutic at [facility] and patient was given 5 mg vitamin K prior to transfer to [facility]. INR 2.0 on admission, warfarin held since 1/13. Pharmacy now consulted to restart warfarin now that hematuria has resolved and patient getting ready for discharge back to [facility].  INR today 1.05. HGB 9, PLT 244, no further bleeding noted.  PTA warfarin dose: 10 mg daily  Goal of Therapy:  INR 2-3 Monitor platelets by anticoagulation protocol: Yes   Plan:  Warfarin 10 mg PO x 1 today. Lower subsequent doses may be required to maintain therapeutic INR given recent supratherapeutic INR on 10 mg daily at [facility]. Daily INR, CBC Monitor for s/ of bleeding (particularly hematuria)   Craig Oneal, PharmD PGY1 Pharmacy Resident  Phone: #[phone] After 3:30PM please call Main Pharmacy [phone] 09/24/2017,8:37 AM

## 2017-09-25 DIAGNOSIS — G319 Degenerative disease of nervous system, unspecified: Secondary | ICD-10-CM | POA: Diagnosis not present

## 2017-09-25 DIAGNOSIS — D62 Acute posthemorrhagic anemia: Secondary | ICD-10-CM | POA: Diagnosis present

## 2017-09-25 DIAGNOSIS — J9621 Acute and chronic respiratory failure with hypoxia: Secondary | ICD-10-CM | POA: Diagnosis present

## 2017-09-25 DIAGNOSIS — J440 Chronic obstructive pulmonary disease with acute lower respiratory infection: Secondary | ICD-10-CM | POA: Diagnosis present

## 2017-09-25 DIAGNOSIS — Z931 Gastrostomy status: Secondary | ICD-10-CM | POA: Diagnosis not present

## 2017-09-25 DIAGNOSIS — A4152 Sepsis due to Pseudomonas: Secondary | ICD-10-CM | POA: Diagnosis present

## 2017-09-25 DIAGNOSIS — Y95 Nosocomial condition: Secondary | ICD-10-CM | POA: Diagnosis present

## 2017-09-25 DIAGNOSIS — I745 Embolism and thrombosis of iliac artery: Secondary | ICD-10-CM | POA: Diagnosis present

## 2017-09-25 DIAGNOSIS — Z930 Tracheostomy status: Secondary | ICD-10-CM | POA: Diagnosis not present

## 2017-09-25 DIAGNOSIS — N136 Pyonephrosis: Secondary | ICD-10-CM | POA: Diagnosis present

## 2017-09-25 DIAGNOSIS — J156 Pneumonia due to other aerobic Gram-negative bacteria: Secondary | ICD-10-CM | POA: Diagnosis present

## 2017-09-25 DIAGNOSIS — A408 Other streptococcal sepsis: Secondary | ICD-10-CM | POA: Diagnosis not present

## 2017-09-25 DIAGNOSIS — I4891 Unspecified atrial fibrillation: Secondary | ICD-10-CM | POA: Diagnosis not present

## 2017-09-25 DIAGNOSIS — J151 Pneumonia due to Pseudomonas: Secondary | ICD-10-CM | POA: Diagnosis present

## 2017-09-25 DIAGNOSIS — R1310 Dysphagia, unspecified: Secondary | ICD-10-CM | POA: Diagnosis present

## 2017-09-25 DIAGNOSIS — L89314 Pressure ulcer of right buttock, stage 4: Secondary | ICD-10-CM | POA: Diagnosis present

## 2017-09-25 DIAGNOSIS — Y846 Urinary catheterization as the cause of abnormal reaction of the patient, or of later complication, without mention of misadventure at the time of the procedure: Secondary | ICD-10-CM | POA: Diagnosis present

## 2017-09-25 DIAGNOSIS — R945 Abnormal results of liver function studies: Secondary | ICD-10-CM | POA: Diagnosis present

## 2017-09-25 DIAGNOSIS — I5023 Acute on chronic systolic (congestive) heart failure: Secondary | ICD-10-CM | POA: Diagnosis present

## 2017-09-25 DIAGNOSIS — T8383XA Hemorrhage of genitourinary prosthetic devices, implants and grafts, initial encounter: Secondary | ICD-10-CM | POA: Diagnosis present

## 2017-09-25 DIAGNOSIS — K7200 Acute and subacute hepatic failure without coma: Secondary | ICD-10-CM | POA: Diagnosis present

## 2017-09-25 DIAGNOSIS — N184 Chronic kidney disease, stage 4 (severe): Secondary | ICD-10-CM | POA: Diagnosis present

## 2017-09-25 DIAGNOSIS — I480 Paroxysmal atrial fibrillation: Secondary | ICD-10-CM | POA: Diagnosis present

## 2017-09-25 DIAGNOSIS — B9620 Unspecified Escherichia coli [E. coli] as the cause of diseases classified elsewhere: Secondary | ICD-10-CM | POA: Diagnosis not present

## 2017-09-25 DIAGNOSIS — T83518A Infection and inflammatory reaction due to other urinary catheter, initial encounter: Secondary | ICD-10-CM | POA: Diagnosis present

## 2017-09-25 DIAGNOSIS — N390 Urinary tract infection, site not specified: Secondary | ICD-10-CM | POA: Diagnosis present

## 2017-09-25 DIAGNOSIS — J189 Pneumonia, unspecified organism: Secondary | ICD-10-CM | POA: Diagnosis not present

## 2017-09-25 LAB — COMPREHENSIVE METABOLIC PANEL
ALK PHOS: 1085 U/L — AB (ref 38–126)
ALT: 101 U/L — AB (ref 17–63)
ANION GAP: 11 (ref 5–15)
AST: 55 U/L — ABNORMAL HIGH (ref 15–41)
Albumin: 2.3 g/dL — ABNORMAL LOW (ref 3.5–5.0)
BUN: 43 mg/dL — ABNORMAL HIGH (ref 6–20)
CALCIUM: 9 mg/dL (ref 8.9–10.3)
CO2: 34 mmol/L — ABNORMAL HIGH (ref 22–32)
CREATININE: 0.77 mg/dL (ref 0.61–1.24)
Chloride: 97 mmol/L — ABNORMAL LOW (ref 101–111)
Glucose, Bld: 108 mg/dL — ABNORMAL HIGH (ref 65–99)
Potassium: 4.7 mmol/L (ref 3.5–5.1)
Sodium: 142 mmol/L (ref 135–145)
Total Bilirubin: 0.8 mg/dL (ref 0.3–1.2)
Total Protein: 8.5 g/dL — ABNORMAL HIGH (ref 6.5–8.1)

## 2017-09-25 LAB — CBC
HCT: 32.2 % — ABNORMAL LOW (ref 39.0–52.0)
Hemoglobin: 9.4 g/dL — ABNORMAL LOW (ref 13.0–17.0)
MCH: 24.4 pg — AB (ref 26.0–34.0)
MCHC: 29.2 g/dL — AB (ref 30.0–36.0)
MCV: 83.6 fL (ref 78.0–100.0)
PLATELETS: 276 10*3/uL (ref 150–400)
RBC: 3.85 MIL/uL — ABNORMAL LOW (ref 4.22–5.81)
RDW: 18.7 % — ABNORMAL HIGH (ref 11.5–15.5)
WBC: 13.8 10*3/uL — ABNORMAL HIGH (ref 4.0–10.5)

## 2017-09-25 LAB — GLUCOSE, CAPILLARY: GLUCOSE-CAPILLARY: 99 mg/dL (ref 65–99)

## 2017-09-25 LAB — PROTIME-INR
INR: 1.01
PROTHROMBIN TIME: 13.3 s (ref 11.4–15.2)

## 2017-09-25 LAB — MAGNESIUM: MAGNESIUM: 2.4 mg/dL (ref 1.7–2.4)

## 2017-09-25 MED ORDER — ENOXAPARIN SODIUM 40 MG/0.4ML ~~LOC~~ SOLN: 75.0000 mg | Freq: Two times a day (BID) | SUBCUTANEOUS | 0 refills | Status: DC

## 2017-09-25 MED ORDER — LACTULOSE 10 GM/15ML PO SOLN: 10.0000 g | Freq: Two times a day (BID) | ORAL | 0 refills | Status: AC | PRN

## 2017-09-25 MED ORDER — CIPROFLOXACIN 500 MG/5ML (10%) PO SUSR: 750.0000 mg | Freq: Two times a day (BID) | ORAL | 0 refills | Status: DC

## 2017-09-25 MED ORDER — OXYCODONE HCL 5 MG PO TABS
5.0000 mg | ORAL_TABLET | Freq: Four times a day (QID) | ORAL | 0 refills | Status: AC | PRN
Start: 2017-09-25 — End: ?

## 2017-09-25 MED ORDER — WARFARIN SODIUM 5 MG PO TABS: 10.0000 mg | ORAL_TABLET | Freq: Once | ORAL | Status: DC

## 2017-09-25 MED ORDER — HEPARIN SOD (PORK) LOCK FLUSH 100 UNIT/ML IV SOLN
250.0000 [IU] | INTRAVENOUS | Status: AC | PRN
  Administered 2017-09-25: 250 [IU]

## 2017-09-25 NOTE — Clinical Social Work Placement (Signed)
   CLINICAL SOCIAL WORK PLACEMENT  NOTE  Date:  09/25/2017  Patient Details  Name: Craig DiegoCharlie J Haught Jr. MRN: [id number] Date of Birth: Sep 19, 1950  Clinical Social Work is seeking post-discharge placement for this patient at the Skilled  Nursing Facility level of care (*CSW will initial, date and re-position this form in  chart as items are completed):      Patient/family provided with [facility] Clinical Social Work Department's list of facilities offering this level of care within the geographic area requested by the patient (or if unable, by the patient's family).  Yes   Patient/family informed of their freedom to choose among providers that offer the needed level of care, that participate in Medicare, Medicaid or managed care program needed by the patient, have an available bed and are willing to accept the patient.      Patient/family informed of [name]'s ownership interest in [facility] and [facility], as well as of the fact that they are under no obligation to receive care at these facilities.  PASRR submitted to EDS on       PASRR number received on       Existing PASRR number confirmed on 09/23/17     FL2 transmitted to all facilities in geographic area requested by pt/family on 09/23/17     FL2 transmitted to all facilities within larger geographic area on       Patient informed that his/her managed care company has contracts with or will negotiate with certain facilities, including the following:        Yes   Patient/family informed of bed offers received.  [facility]     Physician recommends and patient chooses bed at      [facility] 09/25/17.  Patient to be transferred to facility by [facility]     Patient family notified on 09/25/17 of transfer.  Name of family member notified:        PHYSICIAN Please prepare prescriptions      Additional Comment:    _______________________________________________ Maree KrabbeBridget A Zetina, LCSW 09/25/2017, 10:07 AM

## 2017-09-25 NOTE — Discharge Summary (Signed)
Physician Discharge Summary  Craig Oneal. RJJ:[id number] DOB: 1951/05/16 DOA: 09/18/2017  PCP: System, Pcp Not In  Admit date: 09/18/2017 Discharge date: 09/25/2017  Admitted From: [facility]  Disposition:  [facility]  Recommendations for Outpatient Follow-up:  1. Follow up with PCP in 1-2 weeks 2. Repeat Lab for CMP and INR in 2 days. For elevated ALP and Subtherapeutic INR. 3. Lovenox 42m/Kg q12hrs bridge to coumadin until INR 2.0-3.0  Home Health: No Equipment/Devices: None  Discharge Condition: Stable CODE STATUS: Full  Diet recommendation: Feeding tube.   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  Patient was on Coumadin with supratherapeutic INR therefore was given vitamin K.  He is urinary growing multidrug-resistant E. coli sensitive to meropenem.  Sputum culture did grow Serratia marcescens and Moraxella catarrhalis.  Patient was admitted here for further care and infectious disease was consulted.   Initially patient was on meropenem and ciprofloxacin but with infectious disease recommendation.  His antibiotics were switched to Cipro VS PEG tube 750 mg twice daily until September 27, 2017.  Wound care team was also following for his decubitus ulcer on his right ischial area.  His breathing status improved significantly and he remained off the ventilator for at least 48 hours with very little secretion.  Initially there was some signs of fluid overload therefore he was diuresed with IV Lasix.  His hematuria resolved therefore his Coumadin was  resumed with Lovenox Bridge.   During his stay his LFTs were noted to be elevated therefore case was discussed with gastroenterology who recommended monitoring this as this could have been secondary to sepsis/shock liver.  Transaminitis is trending down.  He remains symptom-free.  [facility] medically [facility] No complaints today, no acute events overnight.  Vent and Trach setting resume to prehospitalization setting.   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 coli urinary tract infection -At this time continue ciprofloxacin until January 22 -Continue provide supportive care.  Appreciate infectious disease input  Acute on chronic hypoxic respiratory failure -Likely secondary to pneumonia, he is improved quite a bit.  Did not use month support last night.  We will continue to monitor him for another day to see we can get back to his baseline and likely discharge him tomorrow.  Mild acute on chronic systolic CHF, stage II -Echocardiogram couple months  ago showed ejection fraction 40-45%.  Patient has been diuresed with IV Lasix.  Now appears to be back to his baseline.   Will closely monitor.  Daily weight and strict input and output  History of paroxysmal atrial fibrillation -Coumadin resumed but INR still subtherapeutic therefore will bridge him with Lovenox 1 mg/kg every 12 hours.  Goal INR is 2.0-3.0 -Currently he is rate controlled with calcium channel blockers  Acute posthemorrhagic anemia; resolved Anemia chronic disease -Hemoglobin appears to be stable, Foley bag appears to have clear urine without any signs of hematuria -Resume Coumadin  Quadriplegia Cerebral atrophy, neurogenic bladder and dysphagia -Continue tracheostomy care, PEG tube care and Foley.  Right ischial stage IV ulcer -Continue wound care  Cirrhosis of unknown etiology Transaminitis -ALP is quite elevated but continues to trend down.   -Abdominal ultrasound shows gallstones with sludge. - Statin and Tylenol on hold - Case was discussed by Dr. Allyson Sabal with Dr Collene Mares who felt this was multifactorial in nature given sepsis/shock liver.  We will continue to trend LFTs, but if this trends up then he will need formal GI evaluation.      Discharge Instructions   Allergies as of 09/25/2017   No Known Allergies     Medication List    TAKE these medications   acetaminophen 325 MG tablet Commonly known as:  TYLENOL Place 650 mg into feeding tube every 6 (six) hours as needed for mild pain (temp greater than than 100.6).   atorvastatin 10 MG tablet Commonly known as:  LIPITOR Place 1 tablet (10 mg total) into feeding tube at bedtime.   Baclofen 5 MG Tabs Place 5 mg into feeding tube 2 (two) times daily.   chlorhexidine gluconate (MEDLINE KIT) 0.12 % solution Commonly known as:  PERIDEX 15 mLs by Mouth Rinse route 2 (two) times daily.   ciprofloxacin 500 MG/5ML (10%) suspension Commonly known as:  CIPRO Take 7.5 mLs (750 mg total) by mouth 2 (two) times daily.   collagenase ointment Commonly known as:  SANTYL Apply topically daily. Apply Santyl to right buttock  wound Q day, then cover with moist 2X2 and foam dressing.  (Change foam dressing Q 3 days or PRN soiling.)   Darbepoetin Alfa 25 MCG/ML Soln Inject 25 mcg as directed every 28 (twenty-eight) days.   diltiazem 10 mg/ml oral suspension Commonly known as:  CARDIZEM Place 3 mLs (30 mg total) into feeding tube every 6 (six) hours.   enoxaparin 80 MG/0.8ML injection Commonly known as:  LOVENOX Inject 0.75 mLs (75 mg total) into the skin every 12 (twelve) hours. What changed:  Another medication with the same name was added. Make sure you understand how and when to take each.   enoxaparin 40 MG/0.4ML injection Commonly known as:  LOVENOX Inject 0.75 mLs (75 mg total) into the skin every 12 (twelve) hours. What changed:  You were already taking a medication with the same name, and this prescription was added. Make sure you understand how and when to take each.   feeding supplement (PRO-STAT SUGAR FREE 64) Liqd Place 30 mLs into feeding tube 2 (two) times daily.   feeding supplement (VITAL AF 1.2 CAL) Liqd Place 1,000 mLs into feeding tube continuous.   fentaNYL 100 MCG/2ML injection Commonly known as:  SUBLIMAZE Inject 0.5-1 mLs (25-50 mcg total) into the vein every 2 (two) hours as needed for severe pain.   fluticasone 50 MCG/ACT nasal spray Commonly known as:  FLONASE Place 2 sprays into both nostrils daily.  free water Soln Place 200 mLs into feeding tube every 8 (eight) hours.   glycopyrrolate 1 MG tablet Commonly known as:  ROBINUL Place 1 tablet (1 mg total) into feeding tube 2 (two) times daily.   ipratropium-albuterol 0.5-2.5 (3) MG/3ML Soln Commonly known as:  DUONEB Take 3 mLs by nebulization 3 (three) times daily.   lactulose 10 GM/15ML solution Commonly known as:  CHRONULAC Take 15 mLs (10 g total) by mouth 2 (two) times daily as needed for severe constipation.   levothyroxine 125 MCG tablet Commonly known as:  SYNTHROID, LEVOTHROID Place 1 tablet (125 mcg  total) into feeding tube daily before breakfast.   linezolid 600 MG/300ML IVPB Commonly known as:  ZYVOX Inject 300 mLs (600 mg total) into the vein every 12 (twelve) hours.   loratadine 10 MG tablet Commonly known as:  CLARITIN Place 10 mg into feeding tube daily.   meropenem IVPB Commonly known as:  MERREM Inject 1 g into the vein every 12 (twelve) hours. For 10 days- started on 09-16-17   metoprolol tartrate 25 MG tablet Commonly known as:  LOPRESSOR Place 0.5 tablets (12.5 mg total) into feeding tube 2 (two) times daily.   montelukast 10 MG tablet Commonly known as:  SINGULAIR Place 10 mg into feeding tube at bedtime.   multivitamin with minerals Tabs tablet Place 1 tablet into feeding tube daily.   oxyCODONE 5 MG immediate release tablet Commonly known as:  Oxy IR/ROXICODONE 1 tablet (5 mg total) by Per J Tube route every 6 (six) hours as needed for moderate pain.   pantoprazole sodium 40 mg/20 mL Pack Commonly known as:  PROTONIX Place 20 mLs (40 mg total) into feeding tube daily.   simethicone 40 mg/0.60m Susp Commonly known as:  MYLICON Place 0.6 mLs (40 mg total) into feeding tube every 6 (six) hours as needed for flatulence.   Vitamin D (Ergocalciferol) 50000 units Caps capsule Commonly known as:  DRISDOL Place 50,000 Units into feeding tube every Friday.   warfarin 10 MG tablet Commonly known as:  COUMADIN Take 1 tablet (10 mg total) by mouth daily at 6 PM.       No Known Allergies  Consultations:  PCCM for Trach care  Infectious Disease    Procedures/Studies: Ct Abdomen Pelvis W Contrast  Result Date: 09/18/2017 CLINICAL DATA:  67year old male with hematuria and abnormal LFT. EXAM: CT ABDOMEN AND PELVIS WITH CONTRAST TECHNIQUE: Multidetector CT imaging of the abdomen and pelvis was performed using the standard protocol following bolus administration of intravenous contrast. CONTRAST:  1021mISOVUE-300 IOPAMIDOL (ISOVUE-300) INJECTION 61%  COMPARISON:  Abdominal radiograph dated 09/18/2017 and CT of the abdomen pelvis dated 08/04/2017 and 08/02/2017. FINDINGS: Evaluation is limited due to streak artifact caused by patient's arms. Lower chest: Partially visualized small bilateral pleural effusions. There is cardiomegaly with coronary vascular calcification. Partially visualized tip of the catheter in the right atrium. There is diffuse interstitial prominence consistent with CHF. Bibasilar subsegmental atelectasis versus infiltrate noted. No intra-abdominal free air. Small free fluid adjacent to the liver. Hepatobiliary: Minimal irregularity of the hepatic contour concerning for early changes of cirrhosis. Clinical correlation is recommended. A 12 mm subcapsular hypodense focus in the right lobe of the liver (series 3, image 30) is not characterized but may represent an area of scarring. This is similar to the CT of 08/02/2017. there is no intrahepatic biliary ductal dilatation. Layering sludge or small stones noted in the gallbladder. No pericholecystic fluid or evidence of acute cholecystitis by CT.  Pancreas: Unremarkable. No pancreatic ductal dilatation or surrounding inflammatory changes. Spleen: Normal in size without focal abnormality. Adrenals/Urinary Tract: The adrenal glands are unremarkable. Mild bilateral hydronephrosis. There is interval improvement of the right-sided hydronephrosis compared to the prior CT. There is a 1 cm left renal cyst. Subcentimeter left renal hypodense lesion is not well characterized. There is mild enhancement of the urothelium. Correlation with urinalysis recommended to exclude UTI. No stone identified. The urinary bladder is unremarkable. A Foley catheter is seen with balloon in the prostatic urethra and tip just entering the bladder. Recommend further advancing of the Foley into the bladder. Stomach/Bowel: A percutaneous gastrostomy is noted with balloon and tip in the body of the stomach. Moderate amount of stool  noted throughout the colon. There is no bowel obstruction or active inflammation. Vascular/Lymphatic: There is moderate aortoiliac atherosclerotic disease. 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 There is reconstitution of the flow in the distal right external iliac artery. The origins of the celiac axis, SMA remain patent. No portal venous gas. There is no adenopathy. Mildly rounded lymph node along the right iliac chain measures 9 mm in diameter similar to prior CT. Reproductive: Mildly enlarged prostate gland measures 5.5 cm in diameter. Other: Right ischial deep decubitus ulcer extends close to the ischial tuberosity. No evidence of osteomyelitis by CT. No drainable fluid collection or abscess. Musculoskeletal: No acute osseous pathology. IMPRESSION: 1. Cardiomegaly with findings of CHF, small bilateral pleural effusions, and bibasilar atelectasis versus infiltrate. Clinical correlation is recommended. 2. Mild bilateral hydronephrosis. Correlation with urinalysis recommended to exclude UTI. 3. Foley catheter with balloon in the prostatic urethra. Recommend further advancing the catheter into the bladder. 4. No bowel obstruction or active inflammation. Percutaneous gastrostomy within the stomach. 5. Slight irregularity of the hepatic contour concerning for early changes of cirrhosis. Clinical correlation is recommended. 6. Layering sludge or small stones. 7. Advanced Aortic Atherosclerosis (ICD10-I70.0). 8. 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 This is of indeterminate chronicity but likely chronic. Clinical correlation is recommended. 9. Right ischial decubitus ulcer. No fluid collection or abscess. No evidence of osteomyelitis by CT. Clinical correlation is recommended. Electronically Signed   By: Anner Crete M.D.   On: 09/18/2017 06:19    Dg Chest Port 1 View  Result Date: 09/21/2017 CLINICAL DATA:  Shortness of breath. EXAM: PORTABLE CHEST 1 VIEW COMPARISON:  09/19/2017 FINDINGS: Tracheostomy tube overlies the airway. The right PICC now crosses the midline and terminates over the region of the left brachiocephalic vein, a change from the prior study where the tip projected over the cavoatrial junction. The cardiac silhouette remains mildly enlarged. Mild bilateral perihilar opacity is similar to the prior study and likely represents edema. Left basilar opacity is unchanged and likely represents a combination of atelectasis and small pleural effusion. There is at most a trace residual right pleural effusion. No pneumothorax is identified. IMPRESSION: 1. Interval change in position of PICC tip, now in the left brachiocephalic vein. 2. Unchanged mild pulmonary edema, small left pleural effusion, and likely atelectasis in the left base. Electronically Signed   By: Logan Bores M.D.   On: 09/21/2017 09:12   Dg Chest Port 1 View  Result Date: 09/20/2017 CLINICAL DATA:  PICC placement EXAM: PORTABLE CHEST 1 VIEW COMPARISON:  Chest radiograph from earlier today. FINDINGS: Right PICC terminates at the cavoatrial  junction. Tracheostomy tube terminates over the tracheal air column at the thoracic inlet. Stable cardiomediastinal silhouette with mild cardiomegaly. No pneumothorax. Stable small left pleural effusion. Stable trace right pleural effusion. Stable mild pulmonary edema and left basilar lung opacity. IMPRESSION: 1. Right PICC terminates at the cavoatrial junction. 2. Stable mild congestive heart failure. 3. Stable small left pleural effusion. 4. Stable left basilar lung opacity, probably atelectasis. Electronically Signed   By: Ilona Sorrel M.D.   On: 09/20/2017 00:51   Portable Chest 1 View  Result Date: 09/19/2017 CLINICAL DATA:  Tracheostomy. EXAM: PORTABLE CHEST 1 VIEW COMPARISON:  09/18/2017. FINDINGS: Tracheostomy is midline. Right  PICC tip projects over the SVC RA junction. Heart is enlarged, stable. Mild to moderate mixed interstitial and airspace opacification, similar. Small bilateral pleural effusions. IMPRESSION: Congestive heart failure. Electronically Signed   By: Lorin Picket M.D.   On: 09/19/2017 07:39   Dg Chest Port 1 View  Result Date: 09/18/2017 CLINICAL DATA:  Sepsis EXAM: PORTABLE CHEST 1 VIEW COMPARISON:  340 hours FINDINGS: Stable tracheostomy tube and right PICC. Airspace disease throughout both lungs is stable on the right and worse on the left. No pneumothorax. Small pleural effusions are grossly unchanged. IMPRESSION: Diffuse bilateral airspace disease is stable on the right but worse on the left. Small pleural effusions. Electronically Signed   By: Marybelle Killings M.D.   On: 09/18/2017 12:50   Dg Chest Portable 1 View  Result Date: 09/18/2017 CLINICAL DATA:  Shortness of breath, on treatment for urinary tract infection. EXAM: PORTABLE CHEST 1 VIEW COMPARISON:  Chest radiograph August 15, 2017 FINDINGS: The cardiac silhouette is mildly enlarged and unchanged. Increasing interstitial prominence with retrocardiac consolidation. Small pleural effusions. No pneumothorax. Tracheostomy tube tip projects 5.3 cm above the carina. RIGHT PICC distal tip projecting cavoatrial junction. No pneumothorax. Soft tissue planes and included osseous structures are nonacute. Osteopenia. IMPRESSION: Cardiomegaly. Increasing interstitial prominence seen with atypical infection or pulmonary edema with retrocardiac consolidation. Small pleural effusions. RIGHT PICC distal tip projects at cavoatrial junction. Tracheostomy tube in situ. Electronically Signed   By: Elon Alas M.D.   On: 09/18/2017 04:08   Dg Abd Portable 2 Views  Result Date: 09/18/2017 CLINICAL DATA:  Shortness of breath, on treatment for urinary tract infection. No bowel movement for 4 days. EXAM: PORTABLE ABDOMEN - 2 VIEW COMPARISON:  Abdominal radiograph  August 17, 2017 FINDINGS: Gastrostomy tube projects in stomach. Bowel gas pattern is nondilated and nonobstructive. Large body habitus. Phleboliths project in the pelvis. No intra-abdominal mass effect. Limited assessment for free air due to habitus. Soft tissue planes and included osseous structures are unchanged. IMPRESSION: Nonspecific bowel gas pattern.  Gastrostomy tube in situ. Electronically Signed   By: Elon Alas M.D.   On: 09/18/2017 04:10   US Abdomen Limited Ruq  Result Date: 09/18/2017 CLINICAL DATA:  Elevated LFTs EXAM: ULTRASOUND ABDOMEN LIMITED RIGHT UPPER QUADRANT COMPARISON:  CT 09/18/2017 FINDINGS: Gallbladder: Small layering gallstones and sludge within the gallbladder. Small amount of pericholecystic fluid. Normal wall thickness. Negative sonographic Murphy's. Common bile duct: Diameter: Normal caliber, 5-6 mm. Liver: Slightly nodular contours of the liver suggests the possibility of early cirrhosis. No focal hepatic abnormality. Portal vein is patent on color Doppler imaging with normal direction of blood flow towards the liver. IMPRESSION: Layering gall stones and sludge within the gallbladder. There is a small amount of pericholecystic fluid. There is no wall thickening or sonographic Murphy's sign. This fluid could be related to liver disease. Mildly  nodular contours of the liver suggests cirrhosis. Electronically Signed   By: Rolm Baptise M.D.   On: 09/18/2017 07:42      Subjective:   Discharge Exam: Vitals:   09/25/17 0740 09/25/17 0800  BP: (!) 116/57   Pulse: 78   Resp: (!) 25   Temp:  98.4 F (36.9 C)  SpO2: 92%    Vitals:   09/25/17 0432 09/25/17 0737 09/25/17 0740 09/25/17 0800  BP: (!) 121/51  (!) 116/57   Pulse: 89  78   Resp:   (!) 25   Temp: 98.3 F (36.8 C)   98.4 F (36.9 C)  TempSrc: Oral   Oral  SpO2:  92% 92%   Weight: 74.1 kg (163 lb 5.8 oz)     Height:        General: Pt is alert, awake, not in acute distress; trach in  place Cardiovascular: RRR, S1/S2 +, no rubs, no gallops Respiratory: mostly CTAB/L Abdominal: Soft, NT, ND, bowel sounds +; + peg site noted without any infection.  Extremities: no edema, no cyanosis Foley in place with light yellow urine.    The results of significant diagnostics from this hospitalization (including imaging, microbiology, ancillary and laboratory) are listed below for reference.     Microbiology: Recent Results (from the past 240 hour(s))  Urine Culture     Status: None   Collection Time: 09/18/17  3:37 AM  Result Value Ref Range Status   Specimen Description URINE, RANDOM  Final   Special Requests NONE  Final   Culture NO GROWTH  Final   Report Status 09/19/2017 FINAL  Final  Blood culture (routine x 2)     Status: None   Collection Time: 09/18/17  3:58 AM  Result Value Ref Range Status   Specimen Description BLOOD RIGHT HAND  Final   Special Requests   Final    BOTTLES DRAWN AEROBIC AND ANAEROBIC Blood Culture adequate volume   Culture NO GROWTH 5 DAYS  Final   Report Status 09/23/2017 FINAL  Final  Blood culture (routine x 2)     Status: None   Collection Time: 09/18/17  4:03 AM  Result Value Ref Range Status   Specimen Description BLOOD LEFT HAND  Final   Special Requests   Final    BOTTLES DRAWN AEROBIC AND ANAEROBIC Blood Culture adequate volume   Culture NO GROWTH 5 DAYS  Final   Report Status 09/23/2017 FINAL  Final  MRSA PCR Screening     Status: None   Collection Time: 09/18/17 12:20 PM  Result Value Ref Range Status   MRSA by PCR NEGATIVE NEGATIVE Final    Comment:        The GeneXpert MRSA Assay (FDA approved for NASAL specimens only), is one component of a comprehensive MRSA colonization surveillance program. It is not intended to diagnose MRSA infection nor to guide or monitor treatment for MRSA infections.   Culture, respiratory (NON-Expectorated)     Status: None   Collection Time: 09/19/17  2:11 PM  Result Value Ref Range Status    Specimen Description TRACHEAL ASPIRATE  Final   Special Requests NONE  Final   Gram Stain   Final    ABUNDANT WBC PRESENT,BOTH PMN AND MONONUCLEAR ABUNDANT GRAM NEGATIVE RODS MODERATE SQUAMOUS EPITHELIAL CELLS PRESENT    Culture MODERATE PSEUDOMONAS AERUGINOSA  Final   Report Status 09/22/2017 FINAL  Final   Organism ID, Bacteria PSEUDOMONAS AERUGINOSA  Final      Susceptibility  Pseudomonas aeruginosa - MIC*    CEFTAZIDIME 16 INTERMEDIATE Intermediate     CIPROFLOXACIN 1 SENSITIVE Sensitive     GENTAMICIN <=1 SENSITIVE Sensitive     IMIPENEM >=16 RESISTANT Resistant     CEFEPIME 16 INTERMEDIATE Intermediate     * MODERATE PSEUDOMONAS AERUGINOSA     Labs: BNP (last 3 results) Recent Labs    08/01/17 1912 09/18/17 0347  BNP 316.7* 497.0*   Basic Metabolic Panel: Recent Labs  Lab 09/19/17 0200 09/19/17 0758 09/20/17 0225 09/21/17 0350 09/22/17 0341 09/25/17 0317  NA 143  --  146* 143 142 142  K 4.3  --  4.5 4.4 4.5 4.7  CL 104  --  104 102 102 97*  CO2 31  --  31 31 30  34*  GLUCOSE 110*  --  109* 122* 114* 108*  BUN 44*  --  38* 31* 30* 43*  CREATININE 0.87  --  0.81 0.82 0.75 0.77  CALCIUM 9.0  --  8.8* 8.6* 8.4* 9.0  MG  --  2.1  --  1.7  --  2.4  PHOS  --   --   --  3.2  --   --    Liver Function Tests: Recent Labs  Lab 09/19/17 0200 09/20/17 0225 09/21/17 0350 09/22/17 0341 09/25/17 0317  AST 152* 160* 118* 98* 55*  ALT 297* 273* 225* 186* 101*  ALKPHOS 1,573* 1,794* 1,586* 1,446* 1,085*  BILITOT 1.7* 1.5* 1.3* 1.0 0.8  PROT 7.1 7.4 7.3 7.4 8.5*  ALBUMIN 1.8* 1.9* 1.8* 1.9* 2.3*   No results for input(s): LIPASE, AMYLASE in the last 168 hours. Recent Labs  Lab 09/21/17 1150  AMMONIA 62*   CBC: Recent Labs  Lab 09/18/17 1309  09/19/17 0200  09/19/17 1400 09/19/17 2332 09/20/17 0225 09/21/17 0350 09/23/17 0315 09/25/17 0317  WBC 11.6*  --  11.3*  --   --   --  12.7* 14.2* 12.4* 13.8*  NEUTROABS 9.0*  --   --   --   --   --   --   --    --   --   HGB 7.3*   < > 6.9*   < > 8.0* 8.0* 8.3* 7.8* 9.0* 9.4*  HCT 25.7*  --  23.4*  --  26.6*  --  27.0* 26.2* 31.0* 32.2*  MCV 80.3  --  79.9  --   --   --  80.1 81.9 83.6 83.6  PLT 264  --  242  --   --   --  239 199 244 276   < > = values in this interval not displayed.   Cardiac Enzymes: No results for input(s): CKTOTAL, CKMB, CKMBINDEX, TROPONINI in the last 168 hours. BNP: Invalid input(s): POCBNP CBG: Recent Labs  Lab 09/23/17 2358 09/24/17 0636 09/24/17 1152 09/24/17 1744 09/25/17 0008  GLUCAP 119* 94 93 97 99   D-Dimer No results for input(s): DDIMER in the last 72 hours. Hgb A1c No results for input(s): HGBA1C in the last 72 hours. Lipid Profile No results for input(s): CHOL, HDL, LDLCALC, TRIG, CHOLHDL, LDLDIRECT in the last 72 hours. Thyroid function studies No results for input(s): TSH, T4TOTAL, T3FREE, THYROIDAB in the last 72 hours.  Invalid input(s): FREET3 Anemia work up No results for input(s): VITAMINB12, FOLATE, FERRITIN, TIBC, IRON, RETICCTPCT in the last 72 hours. Urinalysis    Component Value Date/Time   COLORURINE YELLOW 09/19/2017 1542   APPEARANCEUR CLOUDY (A) 09/19/2017 1542   LABSPEC 1.017 09/19/2017  [place] 5.0 09/19/2017 1542   GLUCOSEU NEGATIVE 09/19/2017 1542   HGBUR MODERATE (A) 09/19/2017 1542   BILIRUBINUR NEGATIVE 09/19/2017 1542   KETONESUR NEGATIVE 09/19/2017 1542   PROTEINUR NEGATIVE 09/19/2017 1542   NITRITE NEGATIVE 09/19/2017 1542   LEUKOCYTESUR LARGE (A) 09/19/2017 1542   Sepsis Labs Invalid input(s): PROCALCITONIN,  WBC,  LACTICIDVEN Microbiology Recent Results (from the past 240 hour(s))  Urine Culture     Status: None   Collection Time: 09/18/17  3:37 AM  Result Value Ref Range Status   Specimen Description URINE, RANDOM  Final   Special Requests NONE  Final   Culture NO GROWTH  Final   Report Status 09/19/2017 FINAL  Final  Blood culture (routine x 2)     Status: None   Collection Time: 09/18/17   3:58 AM  Result Value Ref Range Status   Specimen Description BLOOD RIGHT HAND  Final   Special Requests   Final    BOTTLES DRAWN AEROBIC AND ANAEROBIC Blood Culture adequate volume   Culture NO GROWTH 5 DAYS  Final   Report Status 09/23/2017 FINAL  Final  Blood culture (routine x 2)     Status: None   Collection Time: 09/18/17  4:03 AM  Result Value Ref Range Status   Specimen Description BLOOD LEFT HAND  Final   Special Requests   Final    BOTTLES DRAWN AEROBIC AND ANAEROBIC Blood Culture adequate volume   Culture NO GROWTH 5 DAYS  Final   Report Status 09/23/2017 FINAL  Final  MRSA PCR Screening     Status: None   Collection Time: 09/18/17 12:20 PM  Result Value Ref Range Status   MRSA by PCR NEGATIVE NEGATIVE Final    Comment:        The GeneXpert MRSA Assay (FDA approved for NASAL specimens only), is one component of a comprehensive MRSA colonization surveillance program. It is not intended to diagnose MRSA infection nor to guide or monitor treatment for MRSA infections.   Culture, respiratory (NON-Expectorated)     Status: None   Collection Time: 09/19/17  2:11 PM  Result Value Ref Range Status   Specimen Description TRACHEAL ASPIRATE  Final   Special Requests NONE  Final   Gram Stain   Final    ABUNDANT WBC PRESENT,BOTH PMN AND MONONUCLEAR ABUNDANT GRAM NEGATIVE RODS MODERATE SQUAMOUS EPITHELIAL CELLS PRESENT    Culture MODERATE PSEUDOMONAS AERUGINOSA  Final   Report Status 09/22/2017 FINAL  Final   Organism ID, Bacteria PSEUDOMONAS AERUGINOSA  Final      Susceptibility   Pseudomonas aeruginosa - MIC*    CEFTAZIDIME 16 INTERMEDIATE Intermediate     CIPROFLOXACIN 1 SENSITIVE Sensitive     GENTAMICIN <=1 SENSITIVE Sensitive     IMIPENEM >=16 RESISTANT Resistant     CEFEPIME 16 INTERMEDIATE Intermediate     * MODERATE PSEUDOMONAS AERUGINOSA     Time coordinating discharge: Over 30 minutes  SIGNED:   Damita Lack, MD  [facility] 09/25/2017,  9:20 AM Pager   If 7PM-7AM, please contact night-coverage [email or web address] Password [email or web address]

## 2017-09-25 NOTE — Progress Notes (Signed)
Patient has been discharged from facility. This RN placed a call to [facility] to contact the nurse at this number [phone]. Called at 1114 to give report and no answer. Called again at 1124 with no answer. Left a voice mail on the phone to be contacted about patient transfer.   Transport present at bedside ready to take patient. However, [facility] Will attempt to reach out to [facility] RN again. Will continue to monitor and assess patient.

## 2017-09-25 NOTE — Progress Notes (Signed)
ANTICOAGULATION CONSULT NOTE - Initial Consult  Pharmacy Consult for warfarin Indication: atrial fibrillation  No Known Allergies  Patient Measurements: Height: 5' 10" (177.8 cm) Weight: 163 lb 5.8 oz (74.1 kg) IBW/kg (Calculated) : 73   Vital Signs: Temp: 98.3 F (36.8 C) (01/20 0432) Temp Source: Oral (01/20 0432) BP: 116/57 (01/20 0740) Pulse Rate: 78 (01/20 0740)  Labs: Recent Labs    09/23/17 0315 09/25/17 0317  HGB 9.0* 9.4*  HCT 31.0* 32.2*  PLT 244 276  LABPROT 13.6 13.3  INR 1.05 1.01  CREATININE  --  0.77    Estimated Creatinine Clearance: 96.3 mL/min (by C-G formula based on SCr of 0.77 mg/dL).   Medical History: Past Medical History:  Diagnosis Date  . Cerebellar atrophy   . Chronic indwelling Foley catheter   . Chronic kidney disease (CKD), stage IV (severe) (HCC)   . COPD (chronic obstructive pulmonary disease) (HCC)   . Paraplegia (HCC)   . Paroxysmal atrial fibrillation (HCC)   . PEG (percutaneous endoscopic gastrostomy) status (HCC)   . Systolic heart failure (HCC)   . Tracheostomy in place (HCC)   . Unilateral AKA, right (HCC)     Medications:  Scheduled:  . baclofen  5 mg Per Tube BID  . chlorhexidine gluconate (MEDLINE KIT)  15 mL Mouth Rinse BID  . ciprofloxacin  750 mg Oral BID  . collagenase   Topical Daily  . Darbepoetin Alfa  25 mcg Subcutaneous Q28 days  . diltiazem  30 mg Per Tube Q6H  . fluticasone  2 spray Each Nare Daily  . free water  200 mL Per Tube Q8H  . furosemide  20 mg Intravenous Daily  . glycopyrrolate  1 mg Per Tube BID  . ipratropium-albuterol  3 mL Nebulization TID  . lactulose  10 g Oral TID  . levothyroxine  125 mcg Per Tube QAC breakfast  . metoprolol tartrate  12.5 mg Per Tube BID  . montelukast  10 mg Per Tube QHS  . multivitamin with minerals  1 tablet Per Tube Daily  . pantoprazole sodium  40 mg Per Tube Daily  . polyethylene glycol  17 g Oral Daily  . sodium chloride flush  10-40 mL Intracatheter  Q12H  . sodium chloride flush  3 mL Intravenous Q12H  . Vitamin D (Ergocalciferol)  50,000 Units Per Tube Q Fri  . Warfarin - Pharmacist Dosing Inpatient   Does not apply q1800   Infusions:  . feeding supplement (VITAL AF 1.2 CAL) 1,000 mL (09/25/17 0627)    Assessment: 66 y/o male on warfarin PTA for Afib, transferred from [facility] with blood-tinged urine in Foley. Per chart, INR was supratherapeutic at [facility] and patient was given 5 mg vitamin K prior to transfer to [facility]. INR 2.0 on admission, warfarin held since 1/13. Hematuria has resolved, preparing for discharge. Pharmacy consulted to restart warfarin 1/19.   INR today 1.01 (no change since yesterday). HGB 9.4, PLT 276, no further bleeding noted.  PTA warfarin dose: 10 mg daily  Goal of Therapy:  INR 2-3 Monitor platelets by anticoagulation protocol: Yes   Plan:  Warfarin 10 mg PO x 1 today. Lower subsequent doses may be required to maintain therapeutic INR given recent supratherapeutic INR on 10 mg daily at [facility]. Daily INR, CBC Monitor for s/[place] of bleeding (particularly hematuria)   Lindsey Foltanski, PharmD PGY1 Pharmacy Resident Phone: #[phone] After 3:30PM please call Main Pharmacy #[phone] 09/25/2017,8:03 AM  

## 2017-09-25 NOTE — Care Management Note (Signed)
Case Management Note  Patient Details  Name: Craig Oneal. MRN: [id number] Date of Birth: 23-Jul-1951  Subjective/Objective:      Received message that pt would be discharged today, Nelson ChimesAmin, MD concerned as pt needs Lovenox Bridge, as well as Trach collar, both of which can be handled at SNF level. Made Bridgette, CSW aware            Action/Plan:CM will sign off for now but will be available should additional discharge needs arise or disposition change.    Expected Discharge Date:  09/21/17               Expected Discharge Plan:     In-House Referral:     Discharge planning Services  CM Consult  Post Acute Care Choice:    Choice offered to:     DME Arranged:    DME Agency:     HH Arranged:    HH Agency:     Status of Service:  In process, will continue to follow  If discussed at Long Length of Stay Meetings, dates discussed:    Additional Comments:  Craig Oneal, Craig Bitton M, RN 09/25/2017, 9:16 AM

## 2017-09-25 NOTE — Clinical Social Work Note (Signed)
Clinical Social Worker facilitated patient discharge including contacting patient family and facility to confirm patient discharge plans.  Clinical information faxed to facility and family agreeable with plan.  CSW arranged ambulance transport via [facility] to [facility] .  RN to call [phone] for report prior to discharge. Patient will go to room 306.  Clinical Social Worker will sign off for now as social work intervention is no longer needed. Please consult us again if new need arises.  MarkesanBridget Kaus, [place] [phone]

## 2017-09-25 NOTE — Progress Notes (Signed)
Patient has been discharged from the unit. The patient was stable and reported had no belongings with him.

## 2017-10-06 ENCOUNTER — Emergency Department (HOSPITAL_COMMUNITY): Payer: Medicare Other

## 2017-10-06 ENCOUNTER — Inpatient Hospital Stay (HOSPITAL_COMMUNITY)
Admission: EM | Admit: 2017-10-06 | Discharge: 2017-10-14 | DRG: 871 | Disposition: A | Discharge status: Skilled Nursing Facility | Payer: Medicare Other | Attending: Internal Medicine | Admitting: Internal Medicine

## 2017-10-06 ENCOUNTER — Encounter (HOSPITAL_COMMUNITY): Payer: Self-pay

## 2017-10-06 ENCOUNTER — Other Ambulatory Visit: Payer: Self-pay

## 2017-10-06 DIAGNOSIS — J9611 Chronic respiratory failure with hypoxia: Secondary | ICD-10-CM | POA: Diagnosis present

## 2017-10-06 DIAGNOSIS — Z9911 Dependence on respirator [ventilator] status: Secondary | ICD-10-CM

## 2017-10-06 DIAGNOSIS — Z89611 Acquired absence of right leg above knee: Secondary | ICD-10-CM

## 2017-10-06 DIAGNOSIS — G8220 Paraplegia, unspecified: Secondary | ICD-10-CM | POA: Diagnosis present

## 2017-10-06 DIAGNOSIS — Y95 Nosocomial condition: Secondary | ICD-10-CM | POA: Diagnosis present

## 2017-10-06 DIAGNOSIS — J9690 Respiratory failure, unspecified, unspecified whether with hypoxia or hypercapnia: Secondary | ICD-10-CM

## 2017-10-06 DIAGNOSIS — K7460 Unspecified cirrhosis of liver: Secondary | ICD-10-CM | POA: Diagnosis present

## 2017-10-06 DIAGNOSIS — D631 Anemia in chronic kidney disease: Secondary | ICD-10-CM | POA: Diagnosis present

## 2017-10-06 DIAGNOSIS — N368 Other specified disorders of urethra: Secondary | ICD-10-CM | POA: Diagnosis present

## 2017-10-06 DIAGNOSIS — T80211A Bloodstream infection due to central venous catheter, initial encounter: Secondary | ICD-10-CM | POA: Diagnosis present

## 2017-10-06 DIAGNOSIS — E162 Hypoglycemia, unspecified: Secondary | ICD-10-CM | POA: Diagnosis not present

## 2017-10-06 DIAGNOSIS — K5900 Constipation, unspecified: Secondary | ICD-10-CM | POA: Diagnosis present

## 2017-10-06 DIAGNOSIS — B9620 Unspecified Escherichia coli [E. coli] as the cause of diseases classified elsewhere: Secondary | ICD-10-CM

## 2017-10-06 DIAGNOSIS — T827XXA Infection and inflammatory reaction due to other cardiac and vascular devices, implants and grafts, initial encounter: Secondary | ICD-10-CM | POA: Diagnosis present

## 2017-10-06 DIAGNOSIS — I5022 Chronic systolic (congestive) heart failure: Secondary | ICD-10-CM | POA: Diagnosis present

## 2017-10-06 DIAGNOSIS — J449 Chronic obstructive pulmonary disease, unspecified: Secondary | ICD-10-CM

## 2017-10-06 DIAGNOSIS — E876 Hypokalemia: Secondary | ICD-10-CM | POA: Diagnosis present

## 2017-10-06 DIAGNOSIS — B9562 Methicillin resistant Staphylococcus aureus infection as the cause of diseases classified elsewhere: Secondary | ICD-10-CM

## 2017-10-06 DIAGNOSIS — Z930 Tracheostomy status: Secondary | ICD-10-CM

## 2017-10-06 DIAGNOSIS — R319 Hematuria, unspecified: Secondary | ICD-10-CM

## 2017-10-06 DIAGNOSIS — R197 Diarrhea, unspecified: Secondary | ICD-10-CM | POA: Diagnosis present

## 2017-10-06 DIAGNOSIS — N390 Urinary tract infection, site not specified: Secondary | ICD-10-CM | POA: Diagnosis present

## 2017-10-06 DIAGNOSIS — L89314 Pressure ulcer of right buttock, stage 4: Secondary | ICD-10-CM | POA: Diagnosis present

## 2017-10-06 DIAGNOSIS — R140 Abdominal distension (gaseous): Secondary | ICD-10-CM

## 2017-10-06 DIAGNOSIS — A498 Other bacterial infections of unspecified site: Secondary | ICD-10-CM

## 2017-10-06 DIAGNOSIS — I272 Pulmonary hypertension, unspecified: Secondary | ICD-10-CM | POA: Diagnosis present

## 2017-10-06 DIAGNOSIS — Z4659 Encounter for fitting and adjustment of other gastrointestinal appliance and device: Secondary | ICD-10-CM

## 2017-10-06 DIAGNOSIS — A419 Sepsis, unspecified organism: Secondary | ICD-10-CM | POA: Diagnosis present

## 2017-10-06 DIAGNOSIS — Z7901 Long term (current) use of anticoagulants: Secondary | ICD-10-CM

## 2017-10-06 DIAGNOSIS — R739 Hyperglycemia, unspecified: Secondary | ICD-10-CM | POA: Diagnosis present

## 2017-10-06 DIAGNOSIS — L89313 Pressure ulcer of right buttock, stage 3: Secondary | ICD-10-CM | POA: Diagnosis present

## 2017-10-06 DIAGNOSIS — Z1612 Extended spectrum beta lactamase (ESBL) resistance: Secondary | ICD-10-CM

## 2017-10-06 DIAGNOSIS — Z8619 Personal history of other infectious and parasitic diseases: Secondary | ICD-10-CM

## 2017-10-06 DIAGNOSIS — J440 Chronic obstructive pulmonary disease with acute lower respiratory infection: Secondary | ICD-10-CM | POA: Diagnosis present

## 2017-10-06 DIAGNOSIS — T80219A Unspecified infection due to central venous catheter, initial encounter: Secondary | ICD-10-CM

## 2017-10-06 DIAGNOSIS — N184 Chronic kidney disease, stage 4 (severe): Secondary | ICD-10-CM | POA: Diagnosis present

## 2017-10-06 DIAGNOSIS — Z789 Other specified health status: Secondary | ICD-10-CM

## 2017-10-06 DIAGNOSIS — R7881 Bacteremia: Secondary | ICD-10-CM

## 2017-10-06 DIAGNOSIS — J189 Pneumonia, unspecified organism: Secondary | ICD-10-CM | POA: Diagnosis present

## 2017-10-06 DIAGNOSIS — R509 Fever, unspecified: Secondary | ICD-10-CM

## 2017-10-06 DIAGNOSIS — A4102 Sepsis due to Methicillin resistant Staphylococcus aureus: Principal | ICD-10-CM | POA: Diagnosis present

## 2017-10-06 DIAGNOSIS — R6521 Severe sepsis with septic shock: Secondary | ICD-10-CM | POA: Diagnosis present

## 2017-10-06 DIAGNOSIS — Z936 Other artificial openings of urinary tract status: Secondary | ICD-10-CM

## 2017-10-06 DIAGNOSIS — J9610 Chronic respiratory failure, unspecified whether with hypoxia or hypercapnia: Secondary | ICD-10-CM

## 2017-10-06 DIAGNOSIS — E870 Hyperosmolality and hypernatremia: Secondary | ICD-10-CM | POA: Diagnosis present

## 2017-10-06 DIAGNOSIS — Z79890 Hormone replacement therapy (postmenopausal): Secondary | ICD-10-CM

## 2017-10-06 DIAGNOSIS — Z931 Gastrostomy status: Secondary | ICD-10-CM

## 2017-10-06 DIAGNOSIS — L8990 Pressure ulcer of unspecified site, unspecified stage: Secondary | ICD-10-CM

## 2017-10-06 DIAGNOSIS — E039 Hypothyroidism, unspecified: Secondary | ICD-10-CM | POA: Diagnosis present

## 2017-10-06 DIAGNOSIS — Z79899 Other long term (current) drug therapy: Secondary | ICD-10-CM

## 2017-10-06 DIAGNOSIS — A499 Bacterial infection, unspecified: Secondary | ICD-10-CM

## 2017-10-06 DIAGNOSIS — B964 Proteus (mirabilis) (morganii) as the cause of diseases classified elsewhere: Secondary | ICD-10-CM | POA: Diagnosis present

## 2017-10-06 DIAGNOSIS — I480 Paroxysmal atrial fibrillation: Secondary | ICD-10-CM | POA: Diagnosis present

## 2017-10-06 DIAGNOSIS — N179 Acute kidney failure, unspecified: Secondary | ICD-10-CM

## 2017-10-06 DIAGNOSIS — R1032 Left lower quadrant pain: Secondary | ICD-10-CM | POA: Diagnosis not present

## 2017-10-06 DIAGNOSIS — R748 Abnormal levels of other serum enzymes: Secondary | ICD-10-CM | POA: Diagnosis present

## 2017-10-06 DIAGNOSIS — Z7951 Long term (current) use of inhaled steroids: Secondary | ICD-10-CM

## 2017-10-06 DIAGNOSIS — G319 Degenerative disease of nervous system, unspecified: Secondary | ICD-10-CM | POA: Diagnosis present

## 2017-10-06 LAB — URINALYSIS, ROUTINE W REFLEX MICROSCOPIC
Bilirubin Urine: NEGATIVE
Glucose, UA: NEGATIVE mg/dL
Ketones, ur: NEGATIVE mg/dL
NITRITE: NEGATIVE
PH: 5 (ref 5.0–8.0)
Protein, ur: 100 mg/dL — AB
SPECIFIC GRAVITY, URINE: 1.02 (ref 1.005–1.030)

## 2017-10-06 LAB — COMPREHENSIVE METABOLIC PANEL
ALBUMIN: 2 g/dL — AB (ref 3.5–5.0)
ALK PHOS: 537 U/L — AB (ref 38–126)
ALT: 90 U/L — ABNORMAL HIGH (ref 17–63)
ANION GAP: 12 (ref 5–15)
AST: 91 U/L — ABNORMAL HIGH (ref 15–41)
BILIRUBIN TOTAL: 1.2 mg/dL (ref 0.3–1.2)
BUN: 85 mg/dL — ABNORMAL HIGH (ref 6–20)
CALCIUM: 8.2 mg/dL — AB (ref 8.9–10.3)
CO2: 25 mmol/L (ref 22–32)
Chloride: 99 mmol/L — ABNORMAL LOW (ref 101–111)
Creatinine, Ser: 1.97 mg/dL — ABNORMAL HIGH (ref 0.61–1.24)
GFR calc non Af Amer: 34 mL/min — ABNORMAL LOW (ref >60)
GFR, EST AFRICAN AMERICAN: 40 mL/min — AB (ref >60)
GLUCOSE: 128 mg/dL — AB (ref 65–99)
POTASSIUM: 4.3 mmol/L (ref 3.5–5.1)
SODIUM: 136 mmol/L (ref 135–145)
TOTAL PROTEIN: 7.4 g/dL (ref 6.5–8.1)

## 2017-10-06 LAB — I-STAT ARTERIAL BLOOD GAS, ED
ACID-BASE DEFICIT: 9 mmol/L — AB (ref 0.0–2.0)
BICARBONATE: 15.9 mmol/L — AB (ref 20.0–28.0)
O2 Saturation: 91 %
PCO2 ART: 29.7 mmHg — AB (ref 32.0–48.0)
PH ART: 7.331 — AB (ref 7.350–7.450)
PO2 ART: 62 mmHg — AB (ref 83.0–108.0)
Patient temperature: 97.1
TCO2: 17 mmol/L — ABNORMAL LOW (ref 22–32)

## 2017-10-06 LAB — CBC WITH DIFFERENTIAL/PLATELET
BASOS PCT: 0 %
Basophils Absolute: 0.1 10*3/uL (ref 0.0–0.1)
EOS ABS: 0 10*3/uL (ref 0.0–0.7)
EOS PCT: 0 %
HCT: 27.4 % — ABNORMAL LOW (ref 39.0–52.0)
HEMOGLOBIN: 8 g/dL — AB (ref 13.0–17.0)
Lymphocytes Relative: 9 %
Lymphs Abs: 1.3 10*3/uL (ref 0.7–4.0)
MCH: 23.7 pg — ABNORMAL LOW (ref 26.0–34.0)
MCHC: 29.2 g/dL — AB (ref 30.0–36.0)
MCV: 81.1 fL (ref 78.0–100.0)
MONOS PCT: 14 %
Monocytes Absolute: 1.9 10*3/uL — ABNORMAL HIGH (ref 0.1–1.0)
NEUTROS PCT: 77 %
Neutro Abs: 10.3 10*3/uL — ABNORMAL HIGH (ref 1.7–7.7)
PLATELETS: 242 10*3/uL (ref 150–400)
RBC: 3.38 MIL/uL — AB (ref 4.22–5.81)
RDW: 16.9 % — ABNORMAL HIGH (ref 11.5–15.5)
WBC: 13.6 10*3/uL — AB (ref 4.0–10.5)

## 2017-10-06 LAB — I-STAT CG4 LACTIC ACID, ED: Lactic Acid, Venous: 1.8 mmol/L (ref 0.5–1.9)

## 2017-10-06 MED ORDER — NOREPINEPHRINE BITARTRATE 1 MG/ML IV SOLN
0.0000 ug/min | Freq: Once | INTRAVENOUS | Status: AC
  Administered 2017-10-06: 2 ug/min via INTRAVENOUS
  Filled 2017-10-06 (×4): qty 4

## 2017-10-06 MED ORDER — SODIUM CHLORIDE 0.9 % IV BOLUS (SEPSIS)
1000.0000 mL | Freq: Once | INTRAVENOUS | Status: AC
  Administered 2017-10-06: 1000 mL via INTRAVENOUS

## 2017-10-06 NOTE — ED Provider Notes (Signed)
[facility] EMERGENCY DEPARTMENT Provider Note   CSN: [id number] Arrival date & time: 10/06/17  2226     History   Chief Complaint Chief Complaint  Patient presents with  . Hypotension    HPI Craig Oneal. is a 67 y.o. male.  The history is provided by the patient and medical records.    LEVEL V CAVEAT:  PATIENT NONVERBAL 67 year old male with history of cerebellar atrophy, chronic kidney disease stage IV, COPD, paraplegia, paroxysmal A. fib, congestive heart failure, COPD, cirrhosis, presenting to the ED with fever and hypotension.  Patient is from [place].  Staff there reports he was diagnosed with sepsis last night and started on IV fluids as well as amikacin and Levaquin for suspected pneumonia as his roommate was also diagnosed with pneumonia recently.  Tonight he spiked a fever up to 103F and blood pressure remained low at 72/45.  He was given 1 L of fluid at facility and another 500 cc bolus with EMS.  Patient is chronically trach and vent dependent.  Past Medical History:  Diagnosis Date  . Cerebellar atrophy   . Chronic indwelling Foley catheter   . Chronic kidney disease (CKD), stage IV (severe) ([place])   . COPD (chronic obstructive pulmonary disease) ([place])   . Paraplegia ([place])   . Paroxysmal atrial fibrillation (HCC)   . PEG (percutaneous endoscopic gastrostomy) status ([place])   . Systolic heart failure ([place])   . Tracheostomy in place [facility])   . Unilateral AKA, right [facility])     Patient Active Problem List   Diagnosis Date Noted  . UTI (urinary tract infection) 09/18/2017  . E-coli UTI 09/18/2017  . Pneumonia due to Serratia marcescens ([place]) 09/18/2017  . Moraxella catarrhalis pneumonia ([place]) 09/18/2017  . Other cirrhosis of liver ([place]) 09/18/2017  . Chronic bilateral pleural effusions 09/18/2017  . Gastrostomy tube dependent ([place]) 09/18/2017  . 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 Cerebellar atrophy  09/18/2017  . Sepsis ([place]) 09/18/2017  . Atherosclerosis of aortic bifurcation and common iliac arteries ([place]) 09/18/2017  . Bilateral hydronephrosis   . Carbapenem-resistant Enterobacteriaceae infection   . Candida glabrata infection   . Chronic systolic CHF (congestive heart failure) ([place])   . Pulmonary hypertension ([place])   . Atrial fibrillation, chronic ([place])   . Acute renal failure superimposed on stage 3 chronic kidney disease ([place])   . Hypernatremia   . Hypokalemia   . Hypomagnesemia   . Chronic indwelling Foley catheter   . Acute posthemorrhagic anemia   . S/P AKA (above knee amputation) unilateral, right ([place])   . Penile laceration   . Ventilator dependence ([place])   . Candidemia ([place])   . Tracheostomy status ([place])   . Hypoxemia   . Acute on chronic respiratory failure with hypoxemia ([place])   . Acute respiratory failure ([place])   . Amputation of right lower extremity above knee with complication ([place])   . Acute encephalopathy 08/01/2017  . HCAP (healthcare-associated pneumonia)   . Hyperkalemia   . Acute kidney injury ([place])   . Atrial fibrillation with rapid ventricular response ([place])   . Acute pulmonary edema (HCC)   . Decubitus ulcer of right ischial area, stage IV ([place]) 01/23/2017    Past Surgical History:  Procedure Laterality Date  . IR [place] TUBE PERCUT W/FLUORO  08/03/2017       Home Medications    Prior to Admission medications   Medication Sig Start Date End Date Taking? Authorizing  Provider  acetaminophen (TYLENOL) 325 MG tablet Place 650 mg into feeding tube every 6 (six) hours as needed for mild pain (temp greater than than 100.6).     [provider]  Amino Acids-Protein Hydrolys (FEEDING SUPPLEMENT, PRO-STAT SUGAR FREE 64,) LIQD Place 30 mLs into feeding tube 2 (two) times daily. 08/20/17   Allie Bossier, MD  atorvastatin (LIPITOR) 10 MG tablet Place 1 tablet (10 mg total) into feeding tube at bedtime. 08/20/17   Allie Bossier, MD  baclofen 5 MG TABS Place 5 mg into feeding tube 2 (two) times daily. 08/20/17   Allie Bossier, MD  chlorhexidine gluconate, MEDLINE KIT, (PERIDEX) 0.12 % solution 15 mLs by Mouth Rinse route 2 (two) times daily. 08/20/17   Allie Bossier, MD  ciprofloxacin (CIPRO) 500 MG/5ML (10%) suspension Take 7.5 mLs (750 mg total) by mouth 2 (two) times daily. 09/25/17   Amin, Jeanella Flattery, MD  collagenase (SANTYL) ointment Apply topically daily. Apply Santyl to right buttock wound Q day, then cover with moist 2X2 and foam dressing.  (Change foam dressing Q 3 days or PRN soiling.) 08/21/17   Allie Bossier, MD  Darbepoetin Alfa 25 MCG/ML SOLN Inject 25 mcg as directed every 28 (twenty-eight) days.     [provider]  diltiazem (CARDIZEM) 10 mg/ml oral suspension Place 3 mLs (30 mg total) into feeding tube every 6 (six) hours. 08/20/17   Allie Bossier, MD  enoxaparin (LOVENOX) 40 MG/0.4ML injection Inject 0.75 mLs (75 mg total) into the skin every 12 (twelve) hours. 09/25/17   Amin, Jeanella Flattery, MD  enoxaparin (LOVENOX) 80 MG/0.8ML injection Inject 0.75 mLs (75 mg total) into the skin every 12 (twelve) hours. Patient not taking: Reported on 09/18/2017 08/20/17   Allie Bossier, MD  fentaNYL (SUBLIMAZE) 100 MCG/2ML injection Inject 0.5-1 mLs (25-50 mcg total) into the vein every 2 (two) hours as needed for severe pain. Patient not taking: Reported on 09/18/2017 08/20/17   Allie Bossier, MD  fluticasone [facility]) 50 MCG/ACT nasal spray Place 2 sprays into both nostrils daily.     [provider]  glycopyrrolate (ROBINUL) 1 MG tablet Place 1 tablet (1 mg total) into feeding tube 2 (two) times daily. 08/20/17   Allie Bossier, MD  ipratropium-albuterol (DUONEB) 0.5-2.5 (3) MG/3ML SOLN Take 3 mLs by nebulization 3 (three) times daily. 08/20/17   Allie Bossier, MD  lactulose (CHRONULAC) 10 GM/15ML solution Take 15 mLs (10 g total) by mouth 2 (two) times daily as needed for severe  constipation. 09/25/17   Damita Lack, MD  levothyroxine (SYNTHROID, LEVOTHROID) 125 MCG tablet Place 1 tablet (125 mcg total) into feeding tube daily before breakfast. 08/21/17   Allie Bossier, MD  linezolid (ZYVOX) 600 MG/300ML IVPB Inject 300 mLs (600 mg total) into the vein every 12 (twelve) hours. Patient not taking: Reported on 09/18/2017 08/20/17   Allie Bossier, MD  loratadine (CLARITIN) 10 MG tablet Place 10 mg into feeding tube daily.     [provider]  meropenem (MERREM) IVPB Inject 1 g into the vein every 12 (twelve) hours. For 10 days- started on 09-16-17    [provider]  metoprolol tartrate (LOPRESSOR) 25 MG tablet Place 0.5 tablets (12.5 mg total) into feeding tube 2 (two) times daily. 01/25/17   Chesley Mires, MD  montelukast (SINGULAIR) 10 MG tablet Place 10 mg into feeding tube at bedtime.     [provider]  Multiple Vitamin (  MULTIVITAMIN WITH MINERALS) TABS tablet Place 1 tablet into feeding tube daily.     [provider]  Nutritional Supplements (FEEDING SUPPLEMENT, VITAL AF 1.2 CAL,) LIQD Place 1,000 mLs into feeding tube continuous. 08/20/17   Allie Bossier, MD  oxyCODONE (OXY IR/ROXICODONE) 5 MG immediate release tablet 1 tablet (5 mg total) by Per J Tube route every 6 (six) hours as needed for moderate pain. 09/25/17   Amin, Jeanella Flattery, MD  pantoprazole sodium (PROTONIX) 40 mg/20 mL PACK Place 20 mLs (40 mg total) into feeding tube daily. 08/20/17   Allie Bossier, MD  simethicone (MYLICON) 40 XB/2.8UX SUSP Place 0.6 mLs (40 mg total) into feeding tube every 6 (six) hours as needed for flatulence. 08/20/17   Allie Bossier, MD  Vitamin D, Ergocalciferol, (DRISDOL) 50000 units CAPS capsule Place 50,000 Units into feeding tube every Friday.     [provider]  warfarin (COUMADIN) 10 MG tablet Take 1 tablet (10 mg total) by mouth daily at 6 PM. Patient not taking: Reported on 09/18/2017 08/21/17   Allie Bossier, MD    Water For Irrigation, Sterile (FREE WATER) SOLN Place 200 mLs into feeding tube every 8 (eight) hours. 08/20/17   Allie Bossier, MD    Family History Family History  Family history unknown: Yes    Social History Social History   Tobacco Use  . Smoking status: Never Smoker  . Smokeless tobacco: Never Used  Substance Use Topics  . Alcohol use: No    Frequency: Never  . Drug use: No     Allergies   Patient has no known allergies.   Review of Systems Review of Systems  Unable to perform ROS: Other     Physical Exam Updated Vital Signs BP (!) 71/51 (BP Location: Left Arm)   Pulse (!) 104   Temp (!) 97.3 F (36.3 C) (Temporal)   Resp (!) 21   Ht 5' 10"  (1.778 m)   Wt 68.6 kg (151 lb 3.2 oz)   SpO2 100%   BMI 21.69 kg/m   Physical Exam  Constitutional: He appears well-developed and well-nourished.  HENT:  Head: Normocephalic and atraumatic.  Mouth/Throat: Oropharynx is clear and moist.  Trach in place  Eyes: Conjunctivae and EOM are normal. Pupils are equal, round, and reactive to light.  Neck: Normal range of motion.  Cardiovascular: Regular rhythm and normal heart sounds. Tachycardia present.  Pulmonary/Chest: Effort normal and breath sounds normal.  Abdominal: Soft. Bowel sounds are normal.  Indwelling Foley, yellow urine in collection bag  Musculoskeletal: Normal range of motion.  PICC line RUE Right AKA  Neurological: He is alert.  Awake, alert, no verbal responses provided, limited movement  Skin: Skin is warm and dry.  Nursing note and vitals reviewed.    ED Treatments / Results  Labs (all labs ordered are listed, but only abnormal results are displayed) Labs Reviewed  CBC WITH DIFFERENTIAL/PLATELET - Abnormal; Notable for the following components:      Result Value   WBC 13.6 (*)    RBC 3.38 (*)    Hemoglobin 8.0 (*)    HCT 27.4 (*)    MCH 23.7 (*)    MCHC 29.2 (*)    RDW 16.9 (*)    Neutro Abs 10.3 (*)    Monocytes Absolute 1.9 (*)     All other components within normal limits  COMPREHENSIVE METABOLIC PANEL - Abnormal; Notable for the following components:   Chloride 99 (*)  Glucose, Bld 128 (*)    BUN 85 (*)    Creatinine, Ser 1.97 (*)    Calcium 8.2 (*)    Albumin 2.0 (*)    AST 91 (*)    ALT 90 (*)    Alkaline Phosphatase 537 (*)    GFR calc non Af Amer 34 (*)    GFR calc Af Amer 40 (*)    All other components within normal limits  URINALYSIS, ROUTINE W REFLEX MICROSCOPIC - Abnormal; Notable for the following components:   Color, Urine AMBER (*)    APPearance TURBID (*)    Hgb urine dipstick MODERATE (*)    Protein, ur 100 (*)    Leukocytes, UA MODERATE (*)    Bacteria, UA MANY (*)    Squamous Epithelial / LPF 0-5 (*)    Non Squamous Epithelial 0-5 (*)    All other components within normal limits  I-STAT ARTERIAL BLOOD GAS, ED - Abnormal; Notable for the following components:   pH, Arterial 7.331 (*)    pCO2 arterial 29.7 (*)    pO2, Arterial 62.0 (*)    Bicarbonate 15.9 (*)    TCO2 17 (*)    Acid-base deficit 9.0 (*)    All other components within normal limits  I-STAT ARTERIAL BLOOD GAS, ED - Abnormal; Notable for the following components:   pO2, Arterial 176.0 (*)    All other components within normal limits  URINE CULTURE  CULTURE, BLOOD (ROUTINE X 2)  CULTURE, BLOOD (ROUTINE X 2)  CULTURE, RESPIRATORY (NON-EXPECTORATED)  C DIFFICILE QUICK SCREEN W PCR REFLEX  INFLUENZA PANEL BY PCR (TYPE A & B)  PROCALCITONIN  STREP PNEUMONIAE URINARY ANTIGEN  LEGIONELLA PNEUMOPHILA SEROGP 1 UR AG  BLOOD GAS, ARTERIAL  PROTIME-INR  I-STAT CG4 LACTIC ACID, ED  I-STAT CG4 LACTIC ACID, ED    EKG  EKG Interpretation None       Radiology Dg Chest Port 1 View  Result Date: 10/06/2017 CLINICAL DATA:  Fever and hypotension. Low blood pressure. History of COPD. EXAM: PORTABLE CHEST 1 VIEW COMPARISON:  09/21/2017 FINDINGS: Endotracheal tube is in place. Right PICC line with tip over the cavoatrial  junction region. No pneumothorax. Cardiac enlargement. Mild interstitial pattern to the lungs suggesting mild edema. Blunting of the costophrenic angles suggesting tiny bilateral pleural effusions. Similar appearance to previous study. No developing consolidation. No pneumothorax. IMPRESSION: Appliances appear in satisfactory position. Cardiac enlargement with mild interstitial pulmonary edema and small bilateral pleural effusions, similar to previous study. Electronically Signed   By: Lucienne Capers M.D.   On: 10/06/2017 23:32    Procedures Procedures (including critical care time)  CRITICAL CARE Performed by: Larene Pickett   Total critical care time: 60 minutes  Critical care time was exclusive of separately billable procedures and treating other patients.  Critical care was necessary to treat or prevent imminent or life-threatening deterioration.  Critical care was time spent personally by me on the following activities: development of treatment plan with patient and/or surrogate as well as nursing, discussions with consultants, evaluation of patient's response to treatment, examination of patient, obtaining history from patient or surrogate, ordering and performing treatments and interventions, ordering and review of laboratory studies, ordering and review of radiographic studies, pulse oximetry and re-evaluation of patient's condition.   Medications Ordered in ED Medications  ceftolozane-tazobactam (ZERBAXA) 750 mg in sodium chloride 0.9 % 100 mL IVPB (0 mg Intravenous Stopped 10/07/17 0313)  0.9 %  sodium chloride infusion (not administered)  pantoprazole (  PROTONIX) injection 40 mg (not administered)  lactated ringers infusion ( Intravenous Transfusing/Transfer 10/07/17 0426)  fentaNYL (SUBLIMAZE) injection 50 mcg (not administered)  fentaNYL (SUBLIMAZE) injection 50 mcg (not administered)  ipratropium (ATROVENT) nebulizer solution 0.5 mg (0.5 mg Nebulization Given 10/07/17 0401)    levalbuterol (XOPENEX) nebulizer solution 0.63 mg (0.63 mg Nebulization Given 10/07/17 0401)  vancomycin (VANCOCIN) 50 mg/mL oral solution 125 mg (not administered)  sodium chloride 0.9 % bolus 1,000 mL (0 mLs Intravenous Stopped 10/06/17 2330)  norepinephrine (LEVOPHED) 4 mg in dextrose 5 % 250 mL (0.016 mg/mL) infusion (8 mcg/min Intravenous Rate/Dose Change 10/07/17 0434)  linezolid (ZYVOX) IVPB 600 mg (0 mg Intravenous Stopped 10/07/17 0220)     Initial Impression / Assessment and Plan / ED Course  I have reviewed the triage vital signs and the nursing notes.  Pertinent labs & imaging results that were available during my care of the patient were reviewed by me and considered in my medical decision making (see chart for details).  67 year old male sent in from [place] with concern of sepsis.  He has multiple admissions in the past few months for similar clinical picture.  Patient is hypotensive in the 70s on arrival, has had 1500 cc fluid thus far.  Had chest x-ray at facility earlier today that was negative.  He does have C. difficile colitis currently.  Patient given additional fluid bolus, will monitor blood pressure closely.  Workup pending.  11:25 PM Patient without good response to IV fluids-- total 3L given thus far.  Will start levophed drip.  Labs with normal lactate but slight leukocytosis.  AKI noted, IVF should help.  UA does appear infectious but may be colonized.  Culture pending.  CXR clear, however has had multiple positive sputum cultures/blood cultures with negative CXR in the past.  Will start broad spectrum abx.    12:58 AM BP improving with levophed.  Now into 86'P systolic, MAP 70.  Received call from pharmacy, patient with multiple drug resistance.  Last sputum cultures grew out pseudomonas, serratia, moraxella.  Per ID, last cultures grew out GNR, was sensitive to cipro, however if pneumonia again would likely need Zerbaxa empirically due to carbapenem  resistance.  Will switch to Zerbexa andZyvox per pharm recommendations.  Discussed with critical care, will come down to admit.  Final Clinical Impressions(s) / ED Diagnoses   Final diagnoses:  Sepsis, due to unspecified organism [facility])  Urinary tract infection with hematuria, site unspecified  AKI (acute kidney injury) [facility])    ED Discharge Orders    None       Larene Pickett, PA-C 10/07/17 [id number]    Blanchie Dessert, MD 10/07/17 [phone]

## 2017-10-06 NOTE — ED Triage Notes (Signed)
Pt BIBA for eval of hypotension from [facility]. Dx'd w/ sepsis last night and treated in house. Tonight spiked fever to 103 and BP 72/45. 1500cc fluid by facility/EMS, on arrival BP 102/52. Pt is vented via #8 portex trach at baseline, EMS reports pt is at baseline mental status per facility,

## 2017-10-07 DIAGNOSIS — B377 Candidal sepsis: Secondary | ICD-10-CM

## 2017-10-07 DIAGNOSIS — B964 Proteus (mirabilis) (morganii) as the cause of diseases classified elsewhere: Secondary | ICD-10-CM | POA: Diagnosis present

## 2017-10-07 DIAGNOSIS — J9610 Chronic respiratory failure, unspecified whether with hypoxia or hypercapnia: Secondary | ICD-10-CM | POA: Diagnosis not present

## 2017-10-07 DIAGNOSIS — Z89611 Acquired absence of right leg above knee: Secondary | ICD-10-CM

## 2017-10-07 DIAGNOSIS — A419 Sepsis, unspecified organism: Secondary | ICD-10-CM

## 2017-10-07 DIAGNOSIS — Z9911 Dependence on respirator [ventilator] status: Secondary | ICD-10-CM | POA: Diagnosis not present

## 2017-10-07 DIAGNOSIS — N179 Acute kidney failure, unspecified: Secondary | ICD-10-CM | POA: Diagnosis present

## 2017-10-07 DIAGNOSIS — I5020 Unspecified systolic (congestive) heart failure: Secondary | ICD-10-CM

## 2017-10-07 DIAGNOSIS — Z8701 Personal history of pneumonia (recurrent): Secondary | ICD-10-CM

## 2017-10-07 DIAGNOSIS — Z931 Gastrostomy status: Secondary | ICD-10-CM

## 2017-10-07 DIAGNOSIS — K5900 Constipation, unspecified: Secondary | ICD-10-CM | POA: Diagnosis present

## 2017-10-07 DIAGNOSIS — B9562 Methicillin resistant Staphylococcus aureus infection as the cause of diseases classified elsewhere: Secondary | ICD-10-CM | POA: Diagnosis not present

## 2017-10-07 DIAGNOSIS — Z8619 Personal history of other infectious and parasitic diseases: Secondary | ICD-10-CM | POA: Diagnosis not present

## 2017-10-07 DIAGNOSIS — E870 Hyperosmolality and hypernatremia: Secondary | ICD-10-CM | POA: Diagnosis present

## 2017-10-07 DIAGNOSIS — N390 Urinary tract infection, site not specified: Secondary | ICD-10-CM | POA: Diagnosis present

## 2017-10-07 DIAGNOSIS — B9620 Unspecified Escherichia coli [E. coli] as the cause of diseases classified elsewhere: Secondary | ICD-10-CM | POA: Diagnosis present

## 2017-10-07 DIAGNOSIS — J189 Pneumonia, unspecified organism: Secondary | ICD-10-CM | POA: Diagnosis present

## 2017-10-07 DIAGNOSIS — T827XXA Infection and inflammatory reaction due to other cardiac and vascular devices, implants and grafts, initial encounter: Secondary | ICD-10-CM | POA: Diagnosis present

## 2017-10-07 DIAGNOSIS — J449 Chronic obstructive pulmonary disease, unspecified: Secondary | ICD-10-CM

## 2017-10-07 DIAGNOSIS — N368 Other specified disorders of urethra: Secondary | ICD-10-CM | POA: Diagnosis present

## 2017-10-07 DIAGNOSIS — T80211A Bloodstream infection due to central venous catheter, initial encounter: Secondary | ICD-10-CM | POA: Diagnosis present

## 2017-10-07 DIAGNOSIS — L89159 Pressure ulcer of sacral region, unspecified stage: Secondary | ICD-10-CM

## 2017-10-07 DIAGNOSIS — R6521 Severe sepsis with septic shock: Secondary | ICD-10-CM | POA: Diagnosis present

## 2017-10-07 DIAGNOSIS — J440 Chronic obstructive pulmonary disease with acute lower respiratory infection: Secondary | ICD-10-CM | POA: Diagnosis present

## 2017-10-07 DIAGNOSIS — G8220 Paraplegia, unspecified: Secondary | ICD-10-CM | POA: Diagnosis present

## 2017-10-07 DIAGNOSIS — T80219A Unspecified infection due to central venous catheter, initial encounter: Secondary | ICD-10-CM | POA: Diagnosis present

## 2017-10-07 DIAGNOSIS — A4102 Sepsis due to Methicillin resistant Staphylococcus aureus: Secondary | ICD-10-CM | POA: Diagnosis present

## 2017-10-07 DIAGNOSIS — L89314 Pressure ulcer of right buttock, stage 4: Secondary | ICD-10-CM | POA: Diagnosis present

## 2017-10-07 DIAGNOSIS — Y95 Nosocomial condition: Secondary | ICD-10-CM | POA: Diagnosis present

## 2017-10-07 DIAGNOSIS — I480 Paroxysmal atrial fibrillation: Secondary | ICD-10-CM | POA: Diagnosis not present

## 2017-10-07 DIAGNOSIS — L89899 Pressure ulcer of other site, unspecified stage: Secondary | ICD-10-CM | POA: Diagnosis not present

## 2017-10-07 DIAGNOSIS — I5022 Chronic systolic (congestive) heart failure: Secondary | ICD-10-CM | POA: Diagnosis present

## 2017-10-07 DIAGNOSIS — R748 Abnormal levels of other serum enzymes: Secondary | ICD-10-CM | POA: Diagnosis present

## 2017-10-07 DIAGNOSIS — R319 Hematuria, unspecified: Secondary | ICD-10-CM | POA: Diagnosis not present

## 2017-10-07 DIAGNOSIS — R7881 Bacteremia: Secondary | ICD-10-CM | POA: Diagnosis not present

## 2017-10-07 DIAGNOSIS — J9611 Chronic respiratory failure with hypoxia: Secondary | ICD-10-CM | POA: Diagnosis present

## 2017-10-07 DIAGNOSIS — A498 Other bacterial infections of unspecified site: Secondary | ICD-10-CM

## 2017-10-07 DIAGNOSIS — Z95828 Presence of other vascular implants and grafts: Secondary | ICD-10-CM

## 2017-10-07 DIAGNOSIS — Z930 Tracheostomy status: Secondary | ICD-10-CM

## 2017-10-07 DIAGNOSIS — N184 Chronic kidney disease, stage 4 (severe): Secondary | ICD-10-CM | POA: Diagnosis present

## 2017-10-07 DIAGNOSIS — L89313 Pressure ulcer of right buttock, stage 3: Secondary | ICD-10-CM | POA: Diagnosis present

## 2017-10-07 DIAGNOSIS — L8990 Pressure ulcer of unspecified site, unspecified stage: Secondary | ICD-10-CM

## 2017-10-07 DIAGNOSIS — Z87440 Personal history of urinary (tract) infections: Secondary | ICD-10-CM

## 2017-10-07 DIAGNOSIS — Z960 Presence of urogenital implants: Secondary | ICD-10-CM

## 2017-10-07 LAB — GLUCOSE, CAPILLARY
GLUCOSE-CAPILLARY: 133 mg/dL — AB (ref 65–99)
GLUCOSE-CAPILLARY: 125 mg/dL — AB (ref 65–99)
GLUCOSE-CAPILLARY: 132 mg/dL — AB (ref 65–99)
Glucose-Capillary: 163 mg/dL — ABNORMAL HIGH (ref 65–99)
Glucose-Capillary: 188 mg/dL — ABNORMAL HIGH (ref 65–99)
Glucose-Capillary: 204 mg/dL — ABNORMAL HIGH (ref 65–99)

## 2017-10-07 LAB — I-STAT ARTERIAL BLOOD GAS, ED
BICARBONATE: 25.4 mmol/L (ref 20.0–28.0)
O2 SAT: 100 %
PCO2 ART: 44.9 mmHg (ref 32.0–48.0)
PO2 ART: 176 mmHg — AB (ref 83.0–108.0)
TCO2: 27 mmol/L (ref 22–32)
pH, Arterial: 7.361 (ref 7.350–7.450)

## 2017-10-07 LAB — PROCALCITONIN: PROCALCITONIN: 45.77 ng/mL

## 2017-10-07 LAB — PROTIME-INR
INR: 1.45
Prothrombin Time: 17.5 seconds — ABNORMAL HIGH (ref 11.4–15.2)

## 2017-10-07 LAB — C DIFFICILE QUICK SCREEN W PCR REFLEX
C DIFFICILE (CDIFF) INTERP: NOT DETECTED
C DIFFICILE (CDIFF) TOXIN: NEGATIVE
C DIFFICLE (CDIFF) ANTIGEN: NEGATIVE

## 2017-10-07 LAB — HEPARIN LEVEL (UNFRACTIONATED)

## 2017-10-07 LAB — I-STAT CG4 LACTIC ACID, ED: Lactic Acid, Venous: 1.32 mmol/L (ref 0.5–1.9)

## 2017-10-07 LAB — INFLUENZA PANEL BY PCR (TYPE A & B)
Influenza A By PCR: NEGATIVE
Influenza B By PCR: NEGATIVE

## 2017-10-07 LAB — STREP PNEUMONIAE URINARY ANTIGEN: STREP PNEUMO URINARY ANTIGEN: NEGATIVE

## 2017-10-07 MED ORDER — SODIUM CHLORIDE 0.9 % IV SOLN
250.0000 mL | INTRAVENOUS | Status: DC | PRN
  Administered 2017-10-09: 250 mL via INTRAVENOUS

## 2017-10-07 MED ORDER — AMIODARONE LOAD VIA INFUSION
150.0000 mg | Freq: Once | INTRAVENOUS | Status: AC
  Administered 2017-10-07: 150 mg via INTRAVENOUS
  Filled 2017-10-07: qty 83.34

## 2017-10-07 MED ORDER — SODIUM CHLORIDE 0.9 % IV SOLN
750.0000 mg | Freq: Three times a day (TID) | INTRAVENOUS | Status: DC
  Filled 2017-10-07: qty 5.7

## 2017-10-07 MED ORDER — ORAL CARE MOUTH RINSE
15.0000 mL | Freq: Four times a day (QID) | OROMUCOSAL | Status: DC
  Administered 2017-10-07 (×2): 15 mL via OROMUCOSAL
  Administered 2017-10-07: 16:00:00 via OROMUCOSAL
  Administered 2017-10-08 – 2017-10-14 (×27): 15 mL via OROMUCOSAL

## 2017-10-07 MED ORDER — VANCOMYCIN 50 MG/ML ORAL SOLUTION
125.0000 mg | Freq: Four times a day (QID) | ORAL | Status: DC
  Administered 2017-10-07: 125 mg via ORAL
  Filled 2017-10-07 (×3): qty 2.5

## 2017-10-07 MED ORDER — VANCOMYCIN HCL IN DEXTROSE 750-5 MG/150ML-% IV SOLN
750.0000 mg | Freq: Two times a day (BID) | INTRAVENOUS | Status: DC
  Administered 2017-10-07 – 2017-10-08 (×2): 750 mg via INTRAVENOUS
  Filled 2017-10-07 (×2): qty 150

## 2017-10-07 MED ORDER — LEVALBUTEROL HCL 0.63 MG/3ML IN NEBU
0.6300 mg | INHALATION_SOLUTION | Freq: Four times a day (QID) | RESPIRATORY_TRACT | Status: DC
  Administered 2017-10-07 – 2017-10-13 (×28): 0.63 mg via RESPIRATORY_TRACT
  Filled 2017-10-07 (×56): qty 3

## 2017-10-07 MED ORDER — HEPARIN (PORCINE) IN NACL 100-0.45 UNIT/ML-% IJ SOLN
2200.0000 [IU]/h | INTRAMUSCULAR | Status: DC
  Administered 2017-10-07: 1000 [IU]/h via INTRAVENOUS
  Administered 2017-10-08: 1700 [IU]/h via INTRAVENOUS
  Administered 2017-10-08: 1250 [IU]/h via INTRAVENOUS
  Administered 2017-10-09 – 2017-10-10 (×2): 2200 [IU]/h via INTRAVENOUS
  Filled 2017-10-07 (×10): qty 250

## 2017-10-07 MED ORDER — DEXTROSE 5 % IV SOLN
1.2500 g | Freq: Three times a day (TID) | INTRAVENOUS | Status: DC
  Administered 2017-10-07 – 2017-10-08 (×2): 1.25 g via INTRAVENOUS
  Filled 2017-10-07 (×3): qty 6

## 2017-10-07 MED ORDER — VITAL AF 1.2 CAL PO LIQD
1000.0000 mL | ORAL | Status: DC
  Administered 2017-10-07 – 2017-10-10 (×4): 1000 mL

## 2017-10-07 MED ORDER — LINEZOLID 600 MG/300ML IV SOLN
600.0000 mg | Freq: Once | INTRAVENOUS | Status: AC
  Administered 2017-10-07: 600 mg via INTRAVENOUS
  Filled 2017-10-07: qty 300

## 2017-10-07 MED ORDER — PANTOPRAZOLE SODIUM 40 MG IV SOLR: 40.0000 mg | Freq: Every day | INTRAVENOUS | Status: DC

## 2017-10-07 MED ORDER — AMIODARONE HCL IN DEXTROSE 360-4.14 MG/200ML-% IV SOLN
30.0000 mg/h | INTRAVENOUS | Status: DC
  Administered 2017-10-07 – 2017-10-10 (×6): 30 mg/h via INTRAVENOUS
  Filled 2017-10-07 (×9): qty 200

## 2017-10-07 MED ORDER — PRO-STAT SUGAR FREE PO LIQD
30.0000 mL | Freq: Two times a day (BID) | ORAL | Status: DC
  Administered 2017-10-07 – 2017-10-12 (×10): 30 mL
  Filled 2017-10-07 (×12): qty 30

## 2017-10-07 MED ORDER — DEXTROSE 5 % IV SOLN
0.0000 ug/min | INTRAVENOUS | Status: DC
  Administered 2017-10-07: 8 ug/min via INTRAVENOUS
  Administered 2017-10-07 (×2): 15 ug/min via INTRAVENOUS
  Administered 2017-10-07: 10 ug/min via INTRAVENOUS
  Administered 2017-10-07: 17 ug/min via INTRAVENOUS
  Administered 2017-10-08: 12 ug/min via INTRAVENOUS
  Administered 2017-10-08: 15 ug/min via INTRAVENOUS
  Administered 2017-10-09: 4 ug/min via INTRAVENOUS
  Filled 2017-10-07 (×7): qty 4

## 2017-10-07 MED ORDER — SODIUM CHLORIDE 0.9 % IV SOLN
750.0000 mg | Freq: Three times a day (TID) | INTRAVENOUS | Status: AC
  Administered 2017-10-07 (×2): 750 mg via INTRAVENOUS
  Filled 2017-10-07 (×2): qty 5.7

## 2017-10-07 MED ORDER — AMIODARONE HCL IN DEXTROSE 360-4.14 MG/200ML-% IV SOLN
60.0000 mg/h | INTRAVENOUS | Status: AC
Start: 2017-10-07 — End: 2017-10-07
  Administered 2017-10-07 (×2): 60 mg/h via INTRAVENOUS
  Filled 2017-10-07 (×2): qty 200

## 2017-10-07 MED ORDER — LACTATED RINGERS IV SOLN
INTRAVENOUS | Status: DC
  Administered 2017-10-07: 1000 mL via INTRAVENOUS
  Administered 2017-10-07 – 2017-10-08 (×2): via INTRAVENOUS

## 2017-10-07 MED ORDER — CHLORHEXIDINE GLUCONATE 0.12% ORAL RINSE (MEDLINE KIT)
15.0000 mL | Freq: Two times a day (BID) | OROMUCOSAL | Status: DC
  Administered 2017-10-07 – 2017-10-14 (×15): 15 mL via OROMUCOSAL

## 2017-10-07 MED ORDER — FENTANYL CITRATE (PF) 100 MCG/2ML IJ SOLN
50.0000 ug | INTRAMUSCULAR | Status: DC | PRN
  Administered 2017-10-13: 50 ug via INTRAVENOUS
  Filled 2017-10-07: qty 2

## 2017-10-07 MED ORDER — SODIUM CHLORIDE 0.9 % IV BOLUS (SEPSIS)
500.0000 mL | Freq: Once | INTRAVENOUS | Status: AC
  Administered 2017-10-07: 500 mL via INTRAVENOUS

## 2017-10-07 MED ORDER — LINEZOLID 600 MG PO TABS
600.0000 mg | ORAL_TABLET | Freq: Two times a day (BID) | ORAL | Status: DC
  Administered 2017-10-07: 600 mg
  Filled 2017-10-07: qty 1

## 2017-10-07 MED ORDER — LINEZOLID 600 MG/300ML IV SOLN
600.0000 mg | Freq: Two times a day (BID) | INTRAVENOUS | Status: DC
  Filled 2017-10-07: qty 300

## 2017-10-07 MED ORDER — FENTANYL CITRATE (PF) 100 MCG/2ML IJ SOLN
50.0000 ug | INTRAMUSCULAR | Status: DC | PRN
  Administered 2017-10-11 (×2): 25 ug via INTRAVENOUS
  Filled 2017-10-07: qty 2

## 2017-10-07 MED ORDER — PANTOPRAZOLE SODIUM 40 MG PO PACK
40.0000 mg | PACK | Freq: Every day | ORAL | Status: DC
  Administered 2017-10-07 – 2017-10-13 (×7): 40 mg
  Filled 2017-10-07 (×8): qty 20

## 2017-10-07 MED ORDER — IPRATROPIUM BROMIDE 0.02 % IN SOLN
0.5000 mg | Freq: Four times a day (QID) | RESPIRATORY_TRACT | Status: DC
  Administered 2017-10-07 – 2017-10-13 (×28): 0.5 mg via RESPIRATORY_TRACT
  Filled 2017-10-07 (×28): qty 2.5

## 2017-10-07 NOTE — Progress Notes (Addendum)
PCCM INTERVAL PROGRESS NOTE  Continued watery diarrhea with history of C diff and recent heavy ABX use.   Will start PO vancomycin. C. Difficile PCR pending.   Craig Oneal, AGACNP-BC [facility], [facility]/Critical Care Pager [phone] or [phone]  10/07/2017 5:01 AM

## 2017-10-07 NOTE — Consult Note (Signed)
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  Penile wound is clean, pink and moist also Drainage (amount, consistency, odor) minimal, non purulent Periwound: intact, with evidence of scarring and healing over the sacrum Dressing procedure/placement/frequency: Saline moist gauze dressings to chronic pressure injuries No topical care recommended for penis, would need urological intervention if repair desired.  Progressa bed in place while in the ICU For moisture management and pressure redistribution, will add low air loss mattress at time of DC to non ICU unit. Prevelon boot in place on the left foot for vunerable heel Maximize nutrition for wound healing.  Patient is trached and vented today at the time of my assessment, no family in the room  Discussed POC with patient and bedside nurse.  Re consult if needed, will not follow at this time. Thanks  Tarron Krolak M.D.[employer] MSN, RN,CWOCN, CNS, CWON-AP [phone]

## 2017-10-07 NOTE — Progress Notes (Signed)
PULMONARY / CRITICAL CARE MEDICINE   Name: Craig Oneal. MRN: [id number] DOB: 1950/11/17    ADMISSION DATE:  10/06/2017 CONSULTATION DATE:  2/1  REFERRING MD:  Sharilyn Sites PA-C ED  CHIEF COMPLAINT:  Shock  HISTORY OF PRESENT ILLNESS:   67 year old male with PMH as below, which is significant for cerebellar atrophy with associated paraplegia, chronic trach with nocturnal vent, and PEG.  History also significant for stage IV CKD, COPD, paroxysmal atrial fibrillation, systolic CHF with LVEF 40%, and right above-the-knee amputation.  He was recently admitted to [facility] for sepsis secondary to multidrug resistant pneumonia culture positive for Pseudomonas, Moraxella, and Serratia and multidrug resistant E. coli urinary tract infection.  He was initially managed with meropenem and ciprofloxacin.  Infectious diseases was consulted and antibiotics were changed to ciprofloxacin via PEG. He was discharged to [facility] on this regimen until 1/22. Then 1/30 he developed fevers and was felt to possibly have developed pneumonia again. He was started on amikacin and levofloxacin for this. 1/31 fevers worsened up to 103F and he became hypotensive, which persisted despite IVF bolus at [facility]. He was transported to ED for further evaluation.   Upon arrival to the ED he was persistently hypotensive despite 3L IVF resuscitation and started on norepinephrine. Laboratory evaluation significant for WBC 13.6, hemoglobin 8.0, creatinine 1.97, AST 91, lactic wnl, UA with moderate leukocytes and many bacteria. Nitrites negative. Due to need for pressors PCCM asked to see.   SUBJECTIVE:  No events overnight, remains on pressors  VITAL SIGNS: BP (!) 116/59   Pulse (!) 112   Temp 98.4 F (36.9 C) (Oral)   Resp 19   Ht 5\' 10"  (1.778 m)   Wt 76 kg (167 lb 8.8 oz)   SpO2 100%   BMI 24.04 kg/m   HEMODYNAMICS:    VENTILATOR SETTINGS: Vent Mode: PSV;CPAP FiO2 (%):  [40 %-50 %] 50 % Set Rate:  [15 bmp] 15  bmp Vt Set:  [500 mL] 500 mL PEEP:  [5 cmH20] 5 cmH20 Pressure Support:  [5 cmH20] 5 cmH20 Plateau Pressure:  [14 cmH20-19 cmH20] 14 cmH20  INTAKE / OUTPUT: I/O last 3 completed shifts: In: 2944.6 [I.V.:1838.9; IV Piggyback:1105.7] Out: 250 [Urine:250]  PHYSICAL EXAMINATION: General:  Chronically ill appearing male, NAD Neuro:  Awake and interactive, moving all ext to command HEENT:  [place]/AT, PERRL, EOM-I and MMM Cardiovascular:  IRIR and tachy Lungs:  Coarse BS diffusely Abdomen:  Soft, non-tender, non-distended Musculoskeletal:  RLE AKA remote Skin:  Grossly intact.   LABS:  BMET Recent Labs  Lab 10/06/17 2243  NA 136  K 4.3  CL 99*  CO2 25  BUN 85*  CREATININE 1.97*  GLUCOSE 128*   Electrolytes Recent Labs  Lab 10/06/17 2243  CALCIUM 8.2*   CBC Recent Labs  Lab 10/06/17 2243  WBC 13.6*  HGB 8.0*  HCT 27.4*  PLT 242   Coag's Recent Labs  Lab 10/07/17 0400  INR 1.45   Sepsis Markers Recent Labs  Lab 10/06/17 2300 10/07/17 0130 10/07/17 0400  LATICACIDVEN 1.80 1.32  --   PROCALCITON  --   --  45.77   ABG Recent Labs  Lab 10/06/17 2332 10/07/17 0414  PHART 7.331* 7.361  PCO2ART 29.7* 44.9  PO2ART 62.0* 176.0*   Liver Enzymes Recent Labs  Lab 10/06/17 2243  AST 91*  ALT 90*  ALKPHOS 537*  BILITOT 1.2  ALBUMIN 2.0*   Cardiac Enzymes No results for input(s): TROPONINI, PROBNP in  the last 168 hours.  Glucose Recent Labs  Lab 10/07/17 0521 10/07/17 0723  GLUCAP 133* 125*   Imaging Dg Chest Port 1 View  Result Date: 10/06/2017 CLINICAL DATA:  Fever and hypotension. Low blood pressure. History of COPD. EXAM: PORTABLE CHEST 1 VIEW COMPARISON:  09/21/2017 FINDINGS: Endotracheal tube is in place. Right PICC line with tip over the cavoatrial junction region. No pneumothorax. Cardiac enlargement. Mild interstitial pattern to the lungs suggesting mild edema. Blunting of the costophrenic angles suggesting tiny bilateral pleural  effusions. Similar appearance to previous study. No developing consolidation. No pneumothorax. IMPRESSION: Appliances appear in satisfactory position. Cardiac enlargement with mild interstitial pulmonary edema and small bilateral pleural effusions, similar to previous study. Electronically Signed   By: Burman NievesWilliam  Stevens M.D.   On: 10/06/2017 23:32   STUDIES:   CULTURES: Blood 2/1 > Urine 2/1 > Sputum 2/1 >  ANTIBIOTICS: Ciprofloxacin >>>1/22 Amikacin 1/30 >1/31 Levofloxacin 1/30 >1/31 Zerbaxa 2/1 >>> Linezolid 2/1 >>>  SIGNIFICANT EVENTS: 1/13 > 1/20 admit for PNA, UTI  LINES/TUBES: PICC RUE 1/18 >>> Trach 07/2016>>>  DISCUSSION: 67 year old male with paraplegia secondary to cerebellar atrophy. [facility] resident for chronic trach, nocturnal vent. Recently admitted for PNA, UTI with multiple organisms with multiple drug resistances. He was discharged 1/20 now is back with fevers and hypotension from what is presumed to be septic shock. He was started on pressors and broad spectrum antibiotics. Admitted to ICU.  ASSESSMENT / PLAN:  PULMONARY A: Chronic respiratory failure  COPD without acute exacerbation ? PNA Small bilateral pleural effusions  P:   TC today if able with vent at night VAP bundle Scheduled xopenex, atrovent See ID  CARDIOVASCULAR A:  Septic shock Atrial fibrillation with RVR Chronic systolic CHF  P:  MAP goal > 65mmHg Norepinephrine titrated to MAP goal, up to 11 Telemetry monitoring Heparin infusion in lieu of home warfarin Holding home metoprolol, diltiazem Amiodarone bolus and drip  RENAL A:   CKD IV reported although baseline creatinine 0.8 on recent admit AKI  P:   S/p hydration Follow BMP Replace electrolytes as indicated BMET in AM  GASTROINTESTINAL A:   Tranasminitis  Diarrhea  P:   Start TF per nutrition C. difficile negative Protonix for SUP  HEMATOLOGIC A:   Anemia hemoglobin at about baseline  P:  Heparin  infusion Follow CBC   INFECTIOUS A:   Presumed septic shock Suspect urinary source, also recent resistant PNA  P:   Due to recent cultures pharmacy recommended Zerbaxa and linezolid. Will continue for now, but should consult ID in AM for their recommendations. 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   P:   RASS goal: 0 PRN fentanyl for analgesia, sedation if needed   FAMILY  - Updates: No family bedside 2/1  - Inter-disciplinary family meet or Palliative Care meeting due by:  2/6  The patient is critically ill with multiple organ systems failure and requires high complexity decision making for assessment and support, frequent evaluation and titration of therapies, application of advanced monitoring technologies and extensive interpretation of multiple databases.   Critical Care Time devoted to patient care services described in this note is  45  Minutes. This time reflects time of care of this signee Dr Koren BoundWesam . This critical care time does not reflect procedure time, or teaching time or supervisory  time of PA/NP/Med student/Med Resident etc but could involve care discussion time.  Alyson Reedy, M.D. [facility] Pulmonary/Critical Care Medicine. Pager: [phone]. After hours pager: [phone].  10/07/2017 8:26 AM

## 2017-10-07 NOTE — Progress Notes (Signed)
Pharmacy Antibiotic Note  Craig DiegoCharlie J Blancett Jr. is a 67 y.o. male admitted on 10/06/2017 from [facility] with sepsis syndrome.  He was stared on Zerbaxa and Linezolid for sepsis. WBC 13.6 and patient afebrile. PCT-45.77 Cr 1.97 crcl approx 6040ml/min. Has HX of MDR pseudomonas (S-Cipro)/serratia in lung and VRE in urine  Pharmacy has been consulted for vancomycin dosing.  Plan: Stop Zyvoxx Vancomycin 750mg  IV q12h  Height: 5\' 10"  (177.8 cm) Weight: 167 lb 8.8 oz (76 kg) IBW/kg (Calculated) : 73  Temp (24hrs), Avg:98.3 F (36.8 C), Min:97.3 F (36.3 C), Max:99.3 F (37.4 C)  Recent Labs  Lab 10/06/17 2243 10/06/17 2300 10/07/17 0130  WBC 13.6*  --   --   CREATININE 1.97*  --   --   LATICACIDVEN  --  1.80 1.32    Estimated Creatinine Clearance: 39.1 mL/min (A) (by C-G formula based on SCr of 1.97 mg/dL (H)).    No Known Allergies  Antimicrobials this admission: zyvox 2/1 zerbaxa 2/1 > Vancomycin 2/1 >  Dose adjustments this admission:   Microbiology results: 1/31 Bcx 2/2 GPC - lab called BCID with MRSA, GNR - per lab - proteus and ecoli  Leota SauersLisa Ailynn Gow Pharm.D. CPP, BCPS Clinical Pharmacist [phone] 10/07/2017 5:20 PM

## 2017-10-07 NOTE — Progress Notes (Signed)
ANTICOAGULATION CONSULT NOTE - Initial Consult  Pharmacy Consult for Heparin Indication: atrial fibrillation  No Known Allergies  Patient Measurements: Height: '5\' 10"'$  (177.8 cm) Weight: 167 lb 8.8 oz (76 kg) IBW/kg (Calculated) : 73  Vital Signs: Temp: 98.8 F (37.1 C) (02/01 0527) Temp Source: Oral (02/01 0527) BP: 95/65 (02/01 0400) Pulse Rate: 73 (02/01 0400)  Labs: Recent Labs    10/06/17 2243 10/07/17 0400  HGB 8.0*  --   HCT 27.4*  --   PLT 242  --   LABPROT  --  17.5*  INR  --  1.45  CREATININE 1.97*  --     Estimated Creatinine Clearance: 39.1 mL/min (A) (by C-G formula based on SCr of 1.97 mg/dL (H)).   Medical History: Past Medical History:  Diagnosis Date  . Cerebellar atrophy   . Chronic indwelling Foley catheter   . Chronic kidney disease (CKD), stage IV (severe) ([place])   . COPD (chronic obstructive pulmonary disease) ([place])   . Paraplegia ([place])   . Paroxysmal atrial fibrillation (HCC)   . PEG (percutaneous endoscopic gastrostomy) status ([place])   . Systolic heart failure ([place])   . Tracheostomy in place [facility])   . Unilateral AKA, right (HCC)     Medications:  Medications Prior to Admission  Medication Sig Dispense Refill Last Dose  . acetaminophen (TYLENOL) 325 MG tablet Place 650 mg into feeding tube every 6 (six) hours as needed for mild pain (temp greater than than 100.6).    unk  . amikacin (AMIKIN) IVPB Inject 150 mg into the vein daily. For 10 days  Started date 10/06/17   unk  . Amino Acids-Protein Hydrolys (FEEDING SUPPLEMENT, PRO-STAT SUGAR FREE 64,) LIQD Place 30 mLs into feeding tube 2 (two) times daily. 900 mL 0 unk  . atorvastatin (LIPITOR) 10 MG tablet Place 1 tablet (10 mg total) into feeding tube at bedtime. 30 tablet 0 unk  . baclofen 5 MG TABS Place 5 mg into feeding tube 2 (two) times daily. 10 each 0 unk  . ceFEPIme 2 g in dextrose 5 % 50 mL Inject 2 g into the vein every 12 (twelve) hours.   unk  . chlorhexidine gluconate, MEDLINE  KIT, (PERIDEX) 0.12 % solution 15 mLs by Mouth Rinse route 2 (two) times daily. 120 mL 0 unk  . collagenase (SANTYL) ointment Apply topically daily. Apply Santyl to right buttock wound Q day, then cover with moist 2X2 and foam dressing.  (Change foam dressing Q 3 days or PRN soiling.) 15 g 0 unk  . Darbepoetin Alfa 25 MCG/ML SOLN Inject 25 mcg as directed every 28 (twenty-eight) days.    unk  . Dextrose-Sodium Chloride (DEXTROSE 5 % AND 0.45% NACL) 5-0.45 % Inject 84 mLs into the vein daily.   unk  . diltiazem (CARDIZEM) 30 MG tablet 30 mg every 6 (six) hours. Per tube   unk  . enoxaparin (LOVENOX) 80 MG/0.8ML injection Inject 0.75 mLs (75 mg total) into the skin every 12 (twelve) hours. (Patient taking differently: Inject 40 mg into the skin every 12 (twelve) hours. ) 48 Syringe 0 unk  . fluticasone (FLONASE) 50 MCG/ACT nasal spray Place 2 sprays into both nostrils daily.    unk  . glycopyrrolate (ROBINUL) 1 MG tablet Place 1 tablet (1 mg total) into feeding tube 2 (two) times daily. 30 tablet 0 unk  . INSULIN ASPART [place] Inject 0-10 Units into the skin daily as needed (sugar levels). 0-150= 0 units 151-200= 2 units 201-250= 4  units 251-300= 6 units 301-350= 8 units 351-400= 10 units >400 call MD   unk  . ipratropium-albuterol (DUONEB) 0.5-2.5 (3) MG/3ML SOLN Take 3 mLs by nebulization 3 (three) times daily. 360 mL 0 unk  . lactulose (CHRONULAC) 10 GM/15ML solution Take 15 mLs (10 g total) by mouth 2 (two) times daily as needed for severe constipation. 240 mL 0 unk  . levofloxacin (LEVAQUIN) 500 MG/100ML SOLN Inject 500 mg into the vein daily.   unk  . levothyroxine (SYNTHROID, LEVOTHROID) 125 MCG tablet Place 1 tablet (125 mcg total) into feeding tube daily before breakfast. 30 tablet 0 unk  . linezolid (ZYVOX) 600 MG/300ML IVPB Inject 300 mLs (600 mg total) into the vein every 12 (twelve) hours. 3600 mL 0 unk  . loratadine (CLARITIN) 10 MG tablet Place 10 mg into feeding tube daily.    unk  .  metoprolol tartrate (LOPRESSOR) 25 MG tablet Place 0.5 tablets (12.5 mg total) into feeding tube 2 (two) times daily.   unk  . montelukast (SINGULAIR) 10 MG tablet Place 10 mg into feeding tube at bedtime.    unk  . Multiple Vitamin (MULTIVITAMIN WITH MINERALS) TABS tablet Place 1 tablet into feeding tube daily.    unk  . Nutritional Supplements (FEEDING SUPPLEMENT, VITAL AF 1.2 CAL,) LIQD Place 1,000 mLs into feeding tube continuous. (Patient taking differently: Place 70 mLs into feeding tube daily. Rate of 51m/hr run from 0600 until 1680) 1500 mL 0 unk  . oxyCODONE (OXY IR/ROXICODONE) 5 MG immediate release tablet 1 tablet (5 mg total) by Per J Tube route every 6 (six) hours as needed for moderate pain. 10 tablet 0 unk  . pantoprazole sodium (PROTONIX) 40 mg/20 mL PACK Place 20 mLs (40 mg total) into feeding tube daily. 30 each 0 unk  . simethicone (MYLICON) 40 mON/6.2XBSUSP Place 0.6 mLs (40 mg total) into feeding tube every 6 (six) hours as needed for flatulence. 15 mL 0 unk  . sodium chloride irrigation 0.9 % irrigation Irrigate with 500 mLs as directed once.   unk  . Vitamin D, Ergocalciferol, (DRISDOL) 50000 units CAPS capsule Place 50,000 Units into feeding tube every Friday.    unk  . warfarin (COUMADIN) 10 MG tablet Take 1 tablet (10 mg total) by mouth daily at 6 PM. (Patient taking differently: Place 5 mg into feeding tube daily at 6 PM. ) 30 tablet 0 unk  . Water For Irrigation, Sterile (FREE WATER) SOLN Place 200 mLs into feeding tube every 8 (eight) hours. (Patient taking differently: Place 200 mLs into feeding tube every 4 (four) hours. ) 1000 mL 0 unk  . ciprofloxacin (CIPRO) 500 MG/5ML (10%) suspension Take 7.5 mLs (750 mg total) by mouth 2 (two) times daily. (Patient not taking: Reported on 10/07/2017) 100 mL 0 Not Taking at Unknown time  . diltiazem (CARDIZEM) 10 mg/ml oral suspension Place 3 mLs (30 mg total) into feeding tube every 6 (six) hours. (Patient not taking: Reported on  10/07/2017) 250 mL 0 Not Taking at Unknown time  . fentaNYL (SUBLIMAZE) 100 MCG/2ML injection Inject 0.5-1 mLs (25-50 mcg total) into the vein every 2 (two) hours as needed for severe pain. (Patient not taking: Reported on 09/18/2017) 8 mL 0 Not Taking at Unknown time    Assessment: 67y.o. male admitted with sepsis, h/o Afib, for heparin.  INR subtherapeutic, was receiving Lovenox 40 mg SQ q12h at [facility]  Goal of Therapy:  Heparin level 0.3-0.7 units/ml Monitor platelets by anticoagulation protocol:  Yes   Plan:  Start heparin 1000 units/hr Check heparin level in 8 hours.    Lawerence Dery, [place] 10/07/2017,5:35 AM

## 2017-10-07 NOTE — Consult Note (Signed)
Date of Admission:  10/06/2017          Reason for Consult: pneumonia    Referring Provider: Yacoub   Assessment: Patient with multiple possible sources of infection (PICC line, chronic indwelling foley, sacral wound, VDRF) with several hospitalizations for MDR organisms presenting with fever and shock from LTAC.  Plan: 1. Obtain sputum culture when able; can send to [facility] for more extensive sensitivities  2. F/u blood cultures and urine cultures; uncertain if urine sample was obtained prior to or after cath exchange. 3. Continue current abx until cultures result  Active Problems:   Chronic respiratory failure (HCC)   Septic shock (HCC)   Chronic obstructive pulmonary disease (HCC)   History of Clostridium difficile infection   Scheduled Meds: . chlorhexidine gluconate (MEDLINE KIT)  15 mL Mouth Rinse BID  . ipratropium  0.5 mg Nebulization Q6H  . levalbuterol  0.63 mg Nebulization Q6H  . mouth rinse  15 mL Mouth Rinse QID  . pantoprazole sodium  40 mg Per Tube QHS   Continuous Infusions: . sodium chloride    . amiodarone 60 mg/hr (10/07/17 0850)   Followed by  . amiodarone    . ceftolozane-tazobactam (ZERBAXA) IVPB Stopped (10/07/17 0313)  . heparin 1,000 Units/hr (10/07/17 0603)  . lactated ringers 75 mL/hr at 10/07/17 0335  . norepinephrine (LEVOPHED) Adult infusion 11 mcg/min (10/07/17 0832)   PRN Meds:.sodium chloride, fentaNYL (SUBLIMAZE) injection, fentaNYL (SUBLIMAZE) injection  HPI: Craig Oneal. is a 67 y.o. male with h/o paraplegia, COPD, PAF, HFrEF, VDRF residing in [place] presenting to [facility] in septic shock. He had already been started on Amikacin and levoquin day prior to transfer.   Patient with recent admission (1/13) to [facility] for multidrug resistant pseudomonas pneumonia; previous admissions had pseudomonas/serratia. Patient discharged back to [facility], completed course of cipro on 1/22, however on 1/30 started developing fevers; started on amikacin and  levofloxacin at [facility]; following day still had fevers to 103F, as well as signs of shock so transferred to [facility] for evaluation and treatment. He also had admission 08/01/17-08/20/2017 for Candida albicans fungemia and VRE UTI.   Lab work shows leukocytosis 13.8 (at discharge 12.4 two weeks ago);  UA with mod luks, nitrite neg, many bacteria, +squams and nonsquam epithelium, +budding yeast.  Flu panel negative; strep pneumo uAg negative; legionella pending.  Hospital course so far: he was started on linezolid and Zerbaxa; no further fevers, however requiring increasing amount of pressors, and addition of amiodarone for A fib w/ RVR. Per nursing, patient has not had sputum production yet to collect for culture.  Review of Systems: ROS Patient endorses feeling feverish and diaphoretic; he denies chest pain, abd pain, dyspnea, pain at foley or PICC site.  Past Medical History:  Diagnosis Date  . Cerebellar atrophy   . Chronic indwelling Foley catheter   . Chronic kidney disease (CKD), stage IV (severe) ([place])   . COPD (chronic obstructive pulmonary disease) ([place])   . Paraplegia ([place])   . Paroxysmal atrial fibrillation (HCC)   . PEG (percutaneous endoscopic gastrostomy) status ([place])   . Systolic heart failure ([place])   . Tracheostomy in place [facility])   . Unilateral AKA, right (HCC)     Social History   Tobacco Use  . Smoking status: Never Smoker  . Smokeless tobacco: Never Used  Substance Use Topics  . Alcohol use: No    Frequency: Never  . Drug use: No    Family History  Family history  unknown: Yes   No Known Allergies  OBJECTIVE: Blood pressure (!) 87/57, pulse (!) 135, temperature 98.4 F (36.9 C), temperature source Oral, resp. rate (!) 27, height 5' 10"  (1.778 m), weight 167 lb 8.8 oz (76 kg), SpO2 100 %.  Physical Exam Constitutional: diaphoretic, however NAD CV: irregularly irregular tachycardia, no M/R/G appreciated, pulses intact Resp: tachypneic, no increased work of  breathing, rales appreciated in right lower ant lung field Abd: decreased bowel sounds, nontender, distended but soft, mass appreciated in LLQ; PEG site clean/wo drainage Ext: s/p R AKA; L leg wo swelling, wo wounds Skin: decubitus ulcer on post RLE w/o surrounding erythema, induration; has good granulation tissue and is w/o drainage or tenderness. RUE PICC site clean w/o rash, induration, tenderness, swelling  Lab Results Lab Results  Component Value Date   WBC 13.6 (H) 10/06/2017   HGB 8.0 (L) 10/06/2017   HCT 27.4 (L) 10/06/2017   MCV 81.1 10/06/2017   PLT 242 10/06/2017    Lab Results  Component Value Date   CREATININE 1.97 (H) 10/06/2017   BUN 85 (H) 10/06/2017   NA 136 10/06/2017   K 4.3 10/06/2017   CL 99 (L) 10/06/2017   CO2 25 10/06/2017    Lab Results  Component Value Date   ALT 90 (H) 10/06/2017   AST 91 (H) 10/06/2017   ALKPHOS 537 (H) 10/06/2017   BILITOT 1.2 10/06/2017     Microbiology: Recent Results (from the past 240 hour(s))  C difficile quick scan w PCR reflex     Status: None   Collection Time: 10/07/17  4:20 AM  Result Value Ref Range Status   C Diff antigen NEGATIVE NEGATIVE Final   C Diff toxin NEGATIVE NEGATIVE Final   C Diff interpretation No C. difficile detected.  Final    [name], MD [facility] [phone] pager   [phone] cell 10/07/2017, 10:06 AM

## 2017-10-07 NOTE — Progress Notes (Signed)
ANTICOAGULATION CONSULT NOTE - Follow up [place] for Heparin Indication: atrial fibrillation  No Known Allergies  Patient Measurements: Height: _0  (177.8 cm) Weight: 167 lb 8.8 oz (76 kg) IBW/kg (Calculated) : 73  Vital Signs: Temp: 98.2 F (36.8 C) (02/01 1508) Temp Source: Oral (02/01 1508) BP: 111/64 (02/01 1800) Pulse Rate: 68 (02/01 1800)  Labs: Recent Labs    10/06/17 2243 10/07/17 0400 10/07/17 1550  HGB 8.0*  --   --   HCT 27.4*  --   --   PLT 242  --   --   LABPROT  --  17.5*  --   INR  --  1.45  --   HEPARINUNFRC  --   --  <0.10*  CREATININE 1.97*  --   --     Estimated Creatinine Clearance: 39.1 mL/min (A) (by C-G formula based on SCr of 1.97 mg/dL (H)).   Medical History: Past Medical History:  Diagnosis Date  . Cerebellar atrophy   . Chronic indwelling Foley catheter   . Chronic kidney disease (CKD), stage IV (severe) ([place])   . COPD (chronic obstructive pulmonary disease) ([place])   . Paraplegia ([place])   . Paroxysmal atrial fibrillation (HCC)   . PEG (percutaneous endoscopic gastrostomy) status ([place])   . Systolic heart failure ([place])   . Tracheostomy in place [facility])   . Unilateral AKA, right (HCC)     Medications:  Medications Prior to Admission  Medication Sig Dispense Refill Last Dose  . acetaminophen (TYLENOL) 325 MG tablet Place 650 mg into feeding tube every 6 (six) hours as needed for mild pain (temp greater than than 100.6).    unk  . amikacin (AMIKIN) IVPB Inject 150 mg into the vein daily. For 10 days  Started date 10/06/17   unk  . Amino Acids-Protein Hydrolys (FEEDING SUPPLEMENT, PRO-STAT SUGAR FREE 64,) LIQD Place 30 mLs into feeding tube 2 (two) times daily. 900 mL 0 unk  . atorvastatin (LIPITOR) 10 MG tablet Place 1 tablet (10 mg total) into feeding tube at bedtime. 30 tablet 0 unk  . baclofen 5 MG TABS Place 5 mg into feeding tube 2 (two) times daily. 10 each 0 unk  . ceFEPIme 2 g in dextrose 5 % 50 mL Inject 2 g  into the vein every 12 (twelve) hours.   unk  . chlorhexidine gluconate, MEDLINE KIT, (PERIDEX) 0.12 % solution 15 mLs by Mouth Rinse route 2 (two) times daily. 120 mL 0 unk  . collagenase (SANTYL) ointment Apply topically daily. Apply Santyl to right buttock wound Q day, then cover with moist 2X2 and foam dressing.  (Change foam dressing Q 3 days or PRN soiling.) 15 g 0 unk  . Darbepoetin Alfa 25 MCG/ML SOLN Inject 25 mcg as directed every 28 (twenty-eight) days.    unk  . Dextrose-Sodium Chloride (DEXTROSE 5 % AND 0.45% NACL) 5-0.45 % Inject 84 mLs into the vein daily.   unk  . diltiazem (CARDIZEM) 30 MG tablet 30 mg every 6 (six) hours. Per tube   unk  . enoxaparin (LOVENOX) 80 MG/0.8ML injection Inject 0.75 mLs (75 mg total) into the skin every 12 (twelve) hours. (Patient taking differently: Inject 40 mg into the skin every 12 (twelve) hours. ) 48 Syringe 0 unk  . fluticasone (FLONASE) 50 MCG/ACT nasal spray Place 2 sprays into both nostrils daily.    unk  . glycopyrrolate (ROBINUL) 1 MG tablet Place 1 tablet (1 mg total) into feeding tube 2 (two)  times daily. 30 tablet 0 unk  . INSULIN ASPART [place] Inject 0-10 Units into the skin daily as needed (sugar levels). 0-150= 0 units 151-200= 2 units 201-250= 4 units 251-300= 6 units 301-350= 8 units 351-400= 10 units >400 call MD   unk  . ipratropium-albuterol (DUONEB) 0.5-2.5 (3) MG/3ML SOLN Take 3 mLs by nebulization 3 (three) times daily. 360 mL 0 unk  . lactulose (CHRONULAC) 10 GM/15ML solution Take 15 mLs (10 g total) by mouth 2 (two) times daily as needed for severe constipation. 240 mL 0 unk  . levofloxacin (LEVAQUIN) 500 MG/100ML SOLN Inject 500 mg into the vein daily.   unk  . levothyroxine (SYNTHROID, LEVOTHROID) 125 MCG tablet Place 1 tablet (125 mcg total) into feeding tube daily before breakfast. 30 tablet 0 unk  . linezolid (ZYVOX) 600 MG/300ML IVPB Inject 300 mLs (600 mg total) into the vein every 12 (twelve) hours. 3600 mL 0 unk  .  loratadine (CLARITIN) 10 MG tablet Place 10 mg into feeding tube daily.    unk  . metoprolol tartrate (LOPRESSOR) 25 MG tablet Place 0.5 tablets (12.5 mg total) into feeding tube 2 (two) times daily.   unk  . montelukast (SINGULAIR) 10 MG tablet Place 10 mg into feeding tube at bedtime.    unk  . Multiple Vitamin (MULTIVITAMIN WITH MINERALS) TABS tablet Place 1 tablet into feeding tube daily.    unk  . Nutritional Supplements (FEEDING SUPPLEMENT, VITAL AF 1.2 CAL,) LIQD Place 1,000 mLs into feeding tube continuous. (Patient taking differently: Place 70 mLs into feeding tube daily. Rate of 23m/hr run from 0600 until 1680) 1500 mL 0 unk  . oxyCODONE (OXY IR/ROXICODONE) 5 MG immediate release tablet 1 tablet (5 mg total) by Per J Tube route every 6 (six) hours as needed for moderate pain. 10 tablet 0 unk  . pantoprazole sodium (PROTONIX) 40 mg/20 mL PACK Place 20 mLs (40 mg total) into feeding tube daily. 30 each 0 unk  . simethicone (MYLICON) 40 mVO/1.6WVSUSP Place 0.6 mLs (40 mg total) into feeding tube every 6 (six) hours as needed for flatulence. 15 mL 0 unk  . sodium chloride irrigation 0.9 % irrigation Irrigate with 500 mLs as directed once.   unk  . Vitamin D, Ergocalciferol, (DRISDOL) 50000 units CAPS capsule Place 50,000 Units into feeding tube every Friday.    unk  . warfarin (COUMADIN) 10 MG tablet Take 1 tablet (10 mg total) by mouth daily at 6 PM. (Patient taking differently: Place 5 mg into feeding tube daily at 6 PM. ) 30 tablet 0 unk  . Water For Irrigation, Sterile (FREE WATER) SOLN Place 200 mLs into feeding tube every 8 (eight) hours. (Patient taking differently: Place 200 mLs into feeding tube every 4 (four) hours. ) 1000 mL 0 unk  . ciprofloxacin (CIPRO) 500 MG/5ML (10%) suspension Take 7.5 mLs (750 mg total) by mouth 2 (two) times daily. (Patient not taking: Reported on 10/07/2017) 100 mL 0 Not Taking at Unknown time  . diltiazem (CARDIZEM) 10 mg/ml oral suspension Place 3 mLs (30 mg  total) into feeding tube every 6 (six) hours. (Patient not taking: Reported on 10/07/2017) 250 mL 0 Not Taking at Unknown time  . fentaNYL (SUBLIMAZE) 100 MCG/2ML injection Inject 0.5-1 mLs (25-50 mcg total) into the vein every 2 (two) hours as needed for severe pain. (Patient not taking: Reported on 09/18/2017) 8 mL 0 Not Taking at Unknown time    Assessment: 67y.o. male admitted with sepsis, h/o  Afib started on  Heparin drip.   INR 1.45 on admit, was receiving Lovenox 40 mg SQ q12h at [facility]  Heparin drip rate 1000 uts/hr HL <0.1,  H/h low watch, no bleeding noted.    Goal of Therapy:  Heparin level 0.3-0.7 units/ml Monitor platelets by anticoagulation protocol: Yes   Plan:  Increase  heparin 1250 units/hr Check heparin level in 8 hours.    Bonnita Nasuti Pharm.D. CPP, BCPS Clinical Pharmacist [phone] 10/07/2017 7:00 PM

## 2017-10-07 NOTE — H&P (Signed)
PULMONARY / CRITICAL CARE MEDICINE   Name: Craig Oneal. MRN: [id number] DOB: 28-Sep-1950    ADMISSION DATE:  10/06/2017 CONSULTATION DATE:  2/1  REFERRING MD:  Quincy Carnes PA-C ED  CHIEF COMPLAINT:  Shock  HISTORY OF PRESENT ILLNESS:   67 year old male with PMH as below, which is significant for cerebellar atrophy with associated paraplegia, chronic trach with nocturnal vent, and PEG.  History also significant for stage IV CKD, COPD, paroxysmal atrial fibrillation, systolic CHF with LVEF 02%, and right above-the-knee amputation.  He was recently admitted to [facility] for sepsis secondary to multidrug resistant pneumonia culture positive for Pseudomonas, Moraxella, and Serratia and multidrug resistant E. coli urinary tract infection.  He was initially managed with meropenem and ciprofloxacin.  Infectious diseases was consulted and antibiotics were changed to ciprofloxacin via PEG. He was discharged to [facility] on this regimen until 1/22. Then 1/30 he developed fevers and was felt to possibly have developed pneumonia again. He was started on amikacin and levofloxacin for this. 1/31 fevers worsened up to 103F and he became hypotensive, which persisted despite IVF bolus at [facility]. He was transported to ED for further evaluation.   Upon arrival to the ED he was persistently hypotensive despite 3L IVF resuscitation and started on norepinephrine. Laboratory evaluation significant for WBC 13.6, hemoglobin 8.0, creatinine 1.97, AST 91, lactic wnl, UA with moderate leukocytes and many bacteria. Nitrites negative. Due to need for pressors PCCM asked to see.   PAST MEDICAL HISTORY :  He  has a past medical history of Cerebellar atrophy, Chronic indwelling Foley catheter, Chronic kidney disease (CKD), stage IV (severe) (HCC), COPD (chronic obstructive pulmonary disease) ([place]), Paraplegia ([place]), Paroxysmal atrial fibrillation ([place]), PEG (percutaneous endoscopic gastrostomy) status ([place]), Systolic heart  failure ([place]), Tracheostomy in place [facility]), and Unilateral AKA, right ([place]).  PAST SURGICAL HISTORY: He  has a past surgical history that includes IR Replc Gastro/Colonic Tube Percut W/Fluoro (08/03/2017).  No Known Allergies  No current facility-administered medications on file prior to encounter.    Current Outpatient Medications on File Prior to Encounter  Medication Sig  . acetaminophen (TYLENOL) 325 MG tablet Place 650 mg into feeding tube every 6 (six) hours as needed for mild pain (temp greater than than 100.6).   Marland Kitchen Amino Acids-Protein Hydrolys (FEEDING SUPPLEMENT, PRO-STAT SUGAR FREE 64,) LIQD Place 30 mLs into feeding tube 2 (two) times daily.  Marland Kitchen atorvastatin (LIPITOR) 10 MG tablet Place 1 tablet (10 mg total) into feeding tube at bedtime.  . baclofen 5 MG TABS Place 5 mg into feeding tube 2 (two) times daily.  . chlorhexidine gluconate, MEDLINE KIT, (PERIDEX) 0.12 % solution 15 mLs by Mouth Rinse route 2 (two) times daily.  . ciprofloxacin (CIPRO) 500 MG/5ML (10%) suspension Take 7.5 mLs (750 mg total) by mouth 2 (two) times daily.  . collagenase (SANTYL) ointment Apply topically daily. Apply Santyl to right buttock wound Q day, then cover with moist 2X2 and foam dressing.  (Change foam dressing Q 3 days or PRN soiling.)  . Darbepoetin Alfa 25 MCG/ML SOLN Inject 25 mcg as directed every 28 (twenty-eight) days.   Marland Kitchen diltiazem (CARDIZEM) 10 mg/ml oral suspension Place 3 mLs (30 mg total) into feeding tube every 6 (six) hours.  . enoxaparin (LOVENOX) 40 MG/0.4ML injection Inject 0.75 mLs (75 mg total) into the skin every 12 (twelve) hours.  . enoxaparin (LOVENOX) 80 MG/0.8ML injection Inject 0.75 mLs (75 mg total) into the skin every 12 (twelve) hours. (Patient not  taking: Reported on 09/18/2017)  . fentaNYL (SUBLIMAZE) 100 MCG/2ML injection Inject 0.5-1 mLs (25-50 mcg total) into the vein every 2 (two) hours as needed for severe pain. (Patient not taking: Reported on 09/18/2017)  .  fluticasone (FLONASE) 50 MCG/ACT nasal spray Place 2 sprays into both nostrils daily.   Marland Kitchen glycopyrrolate (ROBINUL) 1 MG tablet Place 1 tablet (1 mg total) into feeding tube 2 (two) times daily.  Marland Kitchen ipratropium-albuterol (DUONEB) 0.5-2.5 (3) MG/3ML SOLN Take 3 mLs by nebulization 3 (three) times daily.  Marland Kitchen lactulose (CHRONULAC) 10 GM/15ML solution Take 15 mLs (10 g total) by mouth 2 (two) times daily as needed for severe constipation.  Marland Kitchen levothyroxine (SYNTHROID, LEVOTHROID) 125 MCG tablet Place 1 tablet (125 mcg total) into feeding tube daily before breakfast.  . linezolid (ZYVOX) 600 MG/300ML IVPB Inject 300 mLs (600 mg total) into the vein every 12 (twelve) hours. (Patient not taking: Reported on 09/18/2017)  . loratadine (CLARITIN) 10 MG tablet Place 10 mg into feeding tube daily.   . meropenem (MERREM) IVPB Inject 1 g into the vein every 12 (twelve) hours. For 10 days- started on 09-16-17  . metoprolol tartrate (LOPRESSOR) 25 MG tablet Place 0.5 tablets (12.5 mg total) into feeding tube 2 (two) times daily.  . montelukast (SINGULAIR) 10 MG tablet Place 10 mg into feeding tube at bedtime.   . Multiple Vitamin (MULTIVITAMIN WITH MINERALS) TABS tablet Place 1 tablet into feeding tube daily.   . Nutritional Supplements (FEEDING SUPPLEMENT, VITAL AF 1.2 CAL,) LIQD Place 1,000 mLs into feeding tube continuous.  Marland Kitchen oxyCODONE (OXY IR/ROXICODONE) 5 MG immediate release tablet 1 tablet (5 mg total) by Per J Tube route every 6 (six) hours as needed for moderate pain.  . pantoprazole sodium (PROTONIX) 40 mg/20 mL PACK Place 20 mLs (40 mg total) into feeding tube daily.  . simethicone (MYLICON) 40 ES/9.2ZR SUSP Place 0.6 mLs (40 mg total) into feeding tube every 6 (six) hours as needed for flatulence.  . Vitamin D, Ergocalciferol, (DRISDOL) 50000 units CAPS capsule Place 50,000 Units into feeding tube every Friday.   . warfarin (COUMADIN) 10 MG tablet Take 1 tablet (10 mg total) by mouth daily at 6 PM. (Patient  not taking: Reported on 09/18/2017)  . Water For Irrigation, Sterile (FREE WATER) SOLN Place 200 mLs into feeding tube every 8 (eight) hours.    FAMILY HISTORY:  His has no family status information on file.    SOCIAL HISTORY: He  reports that  has never smoked. he has never used smokeless tobacco. He reports that he does not drink alcohol or use drugs.  REVIEW OF SYSTEMS:   Unable as patient on vent.   SUBJECTIVE:    VITAL SIGNS: BP (!) 92/57   Pulse (!) 103   Temp 97.9 F (36.6 C) (Rectal)   Resp 19   Ht _0  (1.778 m)   Wt 151 lb 3.2 oz (68.6 kg)   SpO2 96%   BMI 21.69 kg/m   HEMODYNAMICS:    VENTILATOR SETTINGS: Vent Mode: PRVC FiO2 (%):  [40 %] 40 % Set Rate:  [15 bmp] 15 bmp Vt Set:  [500 mL] 500 mL PEEP:  [5 cmH20] 5 cmH20 Plateau Pressure:  [19 cmH20] 19 cmH20  INTAKE / OUTPUT: No intake/output data recorded.  PHYSICAL EXAMINATION: General:  Middle aged appearing male in NAD Neuro:  Alert, mental status at suspected baseline.  HEENT:  [place]/AT, PERRL, no JVD Cardiovascular:  IRIR, no MRG, tachy Lungs:  Clear Abdomen:  Soft, non-tender, non-distended Musculoskeletal:  RLE AKA remote Skin:  Grossly intact.   LABS:  BMET Recent Labs  Lab 10/06/17 2243  NA 136  K 4.3  CL 99*  CO2 25  BUN 85*  CREATININE 1.97*  GLUCOSE 128*    Electrolytes Recent Labs  Lab 10/06/17 2243  CALCIUM 8.2*    CBC Recent Labs  Lab 10/06/17 2243  WBC 13.6*  HGB 8.0*  HCT 27.4*  PLT 242    Coag's No results for input(s): APTT, INR in the last 168 hours.  Sepsis Markers Recent Labs  Lab 10/06/17 2300 10/07/17 0130  LATICACIDVEN 1.80 1.32    ABG Recent Labs  Lab 10/06/17 2332  PHART 7.331*  PCO2ART 29.7*  PO2ART 62.0*    Liver Enzymes Recent Labs  Lab 10/06/17 2243  AST 91*  ALT 90*  ALKPHOS 537*  BILITOT 1.2  ALBUMIN 2.0*    Cardiac Enzymes No results for input(s): TROPONINI, PROBNP in the last 168 hours.  Glucose No  results for input(s): GLUCAP in the last 168 hours.  Imaging Dg Chest Port 1 View  Result Date: 10/06/2017 CLINICAL DATA:  Fever and hypotension. Low blood pressure. History of COPD. EXAM: PORTABLE CHEST 1 VIEW COMPARISON:  09/21/2017 FINDINGS: Endotracheal tube is in place. Right PICC line with tip over the cavoatrial junction region. No pneumothorax. Cardiac enlargement. Mild interstitial pattern to the lungs suggesting mild edema. Blunting of the costophrenic angles suggesting tiny bilateral pleural effusions. Similar appearance to previous study. No developing consolidation. No pneumothorax. IMPRESSION: Appliances appear in satisfactory position. Cardiac enlargement with mild interstitial pulmonary edema and small bilateral pleural effusions, similar to previous study. Electronically Signed   By: Lucienne Capers M.D.   On: 10/06/2017 23:32    STUDIES:   CULTURES: Blood 2/1 > Urine 2/1 > Sputum 2/1 >  ANTIBIOTICS: Ciprofloxacin >>>1/22 Amikacin 1/30 >1/31 Levofloxacin 1/30 >1/31 Zerbaxa 2/1 >>> Linezolid 2/1 >>>  SIGNIFICANT EVENTS: 1/13 > 1/20 admit for PNA, UTI  LINES/TUBES: PICC RUE 1/18 >>>  DISCUSSION: 67 year old male with paraplegia secondary to cerebellar atrophy. [facility] resident for chronic trach, nocturnal vent. Recently admitted for PNA, UTI with multiple organisms with multiple drug resistances. He was discharged 1/20 now is back with fevers and hypotension from what is presumed to be septic shock. He was started on pressors and broad spectrum antibiotics. Admitted to ICU.  ASSESSMENT / PLAN:  PULMONARY A: Chronic respiratory failure  COPD without acute exacerbation ? PNA Small bilateral pleural effusions  P:   Full vent support for now while acidemic Once improved can attempt ATC during the day VAP bundle Scheduled xopenex, atrovent See ID  CARDIOVASCULAR A:  Septic shock Atrial fibrillation with RVR Chronic systolic CHF  P:  MAP goal >  22mHg Norepinephrine titrated to MAP goal Telemetry monitoring Heparin infusion in lieu of home warfarin Holding home metoprolol, diltiazem May need amiodarone  RENAL A:   CKD IV reported although baseline creatinine 0.8 on recent admit AKI  P:   S/p hydration Follow BMP  GASTROINTESTINAL A:   Tranasminitis  Diarrhea  P:   NPO for now C. difficile testing Protonix for SUP  HEMATOLOGIC A:   Anemia hemoglobin at about baseline  P:  Heparin infusion Follow CBC  INFECTIOUS A:   Presumed septic shock Suspect urinary source, also recent resistant PNA  P:   Due to recent cultures pharmacy recommended Zerbaxa and linezolid. Will continue for now, but should consult ID in AM for their  recommendations. 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   P:   RASS goal: 0 PRN fentanyl for analgesia, sedation if needed   FAMILY  - Updates: Daughter updated via telephone, full code.   - Inter-disciplinary family meet or Palliative Care meeting due by:  2/6    Georgann Housekeeper, AGACNP-BC [facility] Pulmonology/Critical Care Pager [phone] or [phone]  10/07/2017 3:04 AM

## 2017-10-07 NOTE — Progress Notes (Addendum)
Initial Nutrition Assessment  DOCUMENTATION CODES:   Not applicable  INTERVENTION:    Vital AF 1.2 at 55 ml/h (1320 ml per day)  Pro-stat 30 ml BID  Provides 1784 kcal, 129 gm protein, 1071 ml free water daily  NUTRITION DIAGNOSIS:   Inadequate oral intake related to inability to eat as evidenced by NPO status.  GOAL:   Patient will meet greater than or equal to 90% of their needs  MONITOR:   Vent status, TF tolerance, Skin, Labs, I & O's  REASON FOR ASSESSMENT:   Consult Enteral/tube feeding initiation and management  ASSESSMENT:   67 yo male with PMH of paraplegia due to cerebellar atrophy, tracheostomy, nocturnal vent, PEG, PAF, R AKA, CKD, COPD, and HF who was admitted from [place] on 1/31 with fever, septic shock.   Discussed patient in ICU rounds and with RN today. Received MD Consult for TF initiation and management. Patient is currently intubated on ventilator support MV: 10 L/min Temp (24hrs), Avg:98.3 F (36.8 C), Min:97.3 F (36.3 C), Max:99.3 F (37.4 C)  Labs reviewed. Alk phos 537 (H) CBG's: 133-125 Medications reviewed.  NUTRITION - FOCUSED PHYSICAL EXAM:    Most Recent Value  Orbital Region  No depletion  Upper Arm Region  No depletion  Thoracic and Lumbar Region  Unable to assess  Buccal Region  No depletion  Temple Region  No depletion  Clavicle Bone Region  No depletion  Clavicle and Acromion Bone Region  No depletion  Scapular Bone Region  Unable to assess  Dorsal Hand  No depletion  Patellar Region  Unable to assess  Anterior Thigh Region  Unable to assess  Edema (RD Assessment)  Unable to assess  Hair  Reviewed  Eyes  Reviewed  Mouth  Unable to assess  Skin  Reviewed  Nails  Reviewed       Diet Order:  Diet NPO time specified  EDUCATION NEEDS:   No education needs have been identified at this time  Skin:  Skin Assessment: Skin Integrity Issues: Skin Integrity Issues:: Stage III, Stage IV, Unstageable Stage III: R  ischial tuberosity Stage IV: R ischial tuberosity Unstageable: sacrum  Last BM:  2/1  Height:   Ht Readings from Last 1 Encounters:  10/06/17 _0  (1.778 m)    Weight:   Wt Readings from Last 1 Encounters:  10/07/17 167 lb 8.8 oz (76 kg)    Ideal Body Weight:  70.3 kg  BMI:  25.7 (adjusted for AKA)  Estimated Nutritional Needs:   Kcal:  1790  Protein:  120-135 gm  Fluid:  2 L   Molli Barrows, RD, LDN, [place] Pager [phone] After Hours Pager [phone]

## 2017-10-07 NOTE — Progress Notes (Signed)
Pt seen by trach team for consult. No education needed at this time. All necessary equipment available at bedside. Will continue to monitor for progress. 

## 2017-10-08 DIAGNOSIS — D649 Anemia, unspecified: Secondary | ICD-10-CM

## 2017-10-08 DIAGNOSIS — N179 Acute kidney failure, unspecified: Secondary | ICD-10-CM

## 2017-10-08 DIAGNOSIS — A419 Sepsis, unspecified organism: Secondary | ICD-10-CM | POA: Diagnosis not present

## 2017-10-08 DIAGNOSIS — B964 Proteus (mirabilis) (morganii) as the cause of diseases classified elsewhere: Secondary | ICD-10-CM | POA: Diagnosis not present

## 2017-10-08 DIAGNOSIS — Z8619 Personal history of other infectious and parasitic diseases: Secondary | ICD-10-CM

## 2017-10-08 DIAGNOSIS — R140 Abdominal distension (gaseous): Secondary | ICD-10-CM

## 2017-10-08 DIAGNOSIS — J9610 Chronic respiratory failure, unspecified whether with hypoxia or hypercapnia: Secondary | ICD-10-CM | POA: Diagnosis not present

## 2017-10-08 DIAGNOSIS — L89159 Pressure ulcer of sacral region, unspecified stage: Secondary | ICD-10-CM | POA: Diagnosis not present

## 2017-10-08 DIAGNOSIS — A4102 Sepsis due to Methicillin resistant Staphylococcus aureus: Secondary | ICD-10-CM | POA: Diagnosis not present

## 2017-10-08 DIAGNOSIS — Z930 Tracheostomy status: Secondary | ICD-10-CM | POA: Diagnosis not present

## 2017-10-08 DIAGNOSIS — N189 Chronic kidney disease, unspecified: Secondary | ICD-10-CM

## 2017-10-08 DIAGNOSIS — G8220 Paraplegia, unspecified: Secondary | ICD-10-CM | POA: Diagnosis not present

## 2017-10-08 DIAGNOSIS — B9620 Unspecified Escherichia coli [E. coli] as the cause of diseases classified elsewhere: Secondary | ICD-10-CM | POA: Diagnosis not present

## 2017-10-08 DIAGNOSIS — R6521 Severe sepsis with septic shock: Secondary | ICD-10-CM | POA: Diagnosis not present

## 2017-10-08 LAB — PHOSPHORUS: Phosphorus: 1.8 mg/dL — ABNORMAL LOW (ref 2.5–4.6)

## 2017-10-08 LAB — LEGIONELLA PNEUMOPHILA SEROGP 1 UR AG: L. PNEUMOPHILA SEROGP 1 UR AG: NEGATIVE

## 2017-10-08 LAB — HEPARIN LEVEL (UNFRACTIONATED)
HEPARIN UNFRACTIONATED: 0.11 [IU]/mL — AB (ref 0.30–0.70)
Heparin Unfractionated: <0.1 IU/mL — ABNORMAL LOW (ref 0.30–0.70)
Heparin Unfractionated: <0.1 IU/mL — ABNORMAL LOW (ref 0.30–0.70)

## 2017-10-08 LAB — BLOOD CULTURE ID PANEL (REFLEXED)
ACINETOBACTER BAUMANNII: NOT DETECTED
CANDIDA GLABRATA: NOT DETECTED
CARBAPENEM RESISTANCE: NOT DETECTED
Candida albicans: NOT DETECTED
Candida krusei: NOT DETECTED
Candida parapsilosis: NOT DETECTED
Candida tropicalis: NOT DETECTED
Enterobacter cloacae complex: NOT DETECTED
Enterobacteriaceae species: DETECTED — AB
Enterococcus species: NOT DETECTED
Escherichia coli: DETECTED — AB
Haemophilus influenzae: NOT DETECTED
KLEBSIELLA OXYTOCA: NOT DETECTED
Klebsiella pneumoniae: NOT DETECTED
Listeria monocytogenes: NOT DETECTED
METHICILLIN RESISTANCE: DETECTED — AB
NEISSERIA MENINGITIDIS: NOT DETECTED
PROTEUS SPECIES: DETECTED — AB
Pseudomonas aeruginosa: NOT DETECTED
SERRATIA MARCESCENS: NOT DETECTED
STREPTOCOCCUS AGALACTIAE: NOT DETECTED
STREPTOCOCCUS PNEUMONIAE: NOT DETECTED
STREPTOCOCCUS SPECIES: NOT DETECTED
Staphylococcus aureus (BCID): DETECTED — AB
Staphylococcus species: DETECTED — AB
Streptococcus pyogenes: NOT DETECTED

## 2017-10-08 LAB — BASIC METABOLIC PANEL
Anion gap: 10 (ref 5–15)
BUN: 67 mg/dL — AB (ref 6–20)
CALCIUM: 8.1 mg/dL — AB (ref 8.9–10.3)
CHLORIDE: 104 mmol/L (ref 101–111)
CO2: 25 mmol/L (ref 22–32)
CREATININE: 1.17 mg/dL (ref 0.61–1.24)
GFR calc Af Amer: >60 mL/min (ref >60)
Glucose, Bld: 190 mg/dL — ABNORMAL HIGH (ref 65–99)
Potassium: 3.3 mmol/L — ABNORMAL LOW (ref 3.5–5.1)
SODIUM: 139 mmol/L (ref 135–145)

## 2017-10-08 LAB — MAGNESIUM: MAGNESIUM: 2.1 mg/dL (ref 1.7–2.4)

## 2017-10-08 LAB — CBC
HCT: 25.3 % — ABNORMAL LOW (ref 39.0–52.0)
Hemoglobin: 7.4 g/dL — ABNORMAL LOW (ref 13.0–17.0)
MCH: 23.7 pg — AB (ref 26.0–34.0)
MCHC: 29.2 g/dL — AB (ref 30.0–36.0)
MCV: 81.1 fL (ref 78.0–100.0)
PLATELETS: 225 10*3/uL (ref 150–400)
RBC: 3.12 MIL/uL — ABNORMAL LOW (ref 4.22–5.81)
RDW: 17.8 % — AB (ref 11.5–15.5)
WBC: 15 10*3/uL — ABNORMAL HIGH (ref 4.0–10.5)

## 2017-10-08 LAB — URINE CULTURE

## 2017-10-08 LAB — GLUCOSE, CAPILLARY
GLUCOSE-CAPILLARY: 132 mg/dL — AB (ref 65–99)
Glucose-Capillary: 177 mg/dL — ABNORMAL HIGH (ref 65–99)
Glucose-Capillary: 191 mg/dL — ABNORMAL HIGH (ref 65–99)
Glucose-Capillary: 168 mg/dL — ABNORMAL HIGH (ref 65–99)
Glucose-Capillary: 128 mg/dL — ABNORMAL HIGH (ref 65–99)

## 2017-10-08 LAB — ANTITHROMBIN III: AntiThromb III Func: 85 % (ref 75–120)

## 2017-10-08 MED ORDER — VANCOMYCIN HCL IN DEXTROSE 1-5 GM/200ML-% IV SOLN
1000.0000 mg | Freq: Two times a day (BID) | INTRAVENOUS | Status: DC
  Administered 2017-10-08 – 2017-10-09 (×3): 1000 mg via INTRAVENOUS
  Filled 2017-10-08 (×4): qty 200

## 2017-10-08 MED ORDER — INSULIN ASPART 100 UNIT/ML ~~LOC~~ SOLN
0.0000 [IU] | SUBCUTANEOUS | Status: DC
  Administered 2017-10-08: 1 [IU] via SUBCUTANEOUS
  Administered 2017-10-08 (×2): 2 [IU] via SUBCUTANEOUS
  Administered 2017-10-08: 1 [IU] via SUBCUTANEOUS
  Administered 2017-10-08: 2 [IU] via SUBCUTANEOUS
  Administered 2017-10-09 – 2017-10-14 (×6): 1 [IU] via SUBCUTANEOUS

## 2017-10-08 MED ORDER — POTASSIUM PHOSPHATES 15 MMOLE/5ML IV SOLN
10.0000 mmol | Freq: Once | INTRAVENOUS | Status: AC
  Administered 2017-10-08: 10 mmol via INTRAVENOUS
  Filled 2017-10-08: qty 3.33

## 2017-10-08 MED ORDER — DEXTROSE 5 % IV SOLN
2.5000 g | Freq: Three times a day (TID) | INTRAVENOUS | Status: DC
  Administered 2017-10-08 – 2017-10-09 (×3): 2.5 g via INTRAVENOUS
  Filled 2017-10-08 (×5): qty 12

## 2017-10-08 MED ORDER — POTASSIUM CHLORIDE 20 MEQ/15ML (10%) PO SOLN: 20.0000 meq | Freq: Once | ORAL | Status: DC

## 2017-10-08 NOTE — Progress Notes (Signed)
elink MD notified regarding lab technicians unable to draw blood for blood cultures. Awaiting orders at this time. Pt in no signs of distress. Nursing will continue to monitor.

## 2017-10-08 NOTE — Progress Notes (Signed)
Pharmacy Antibiotic Note  Craig DiegoCharlie J Myszka Jr. is a 67 y.o. male admitted on 10/06/2017 from [facility] with hypotension and possible sepsis. Found to have bacteremia with MRSA, E. Coli and proteus species. Patient is CKD stage 4 but renal function has improved today to a Scr of 1.17. Estimated CrCl ~ 60 ml/min (may be an overestimate with paraplegia). WBC is up to 15 today and Tmax 99.6. Will increase antibiotic doses with severity of infection and improved renal function. UOP- 0.8 cc/kg/hr.  Plan: Increase Avycaz to 2.5 gm every 8 hours Increase Vancomycin to 1000 mg every 12 hours  Monitor renal function, clinical s/sx of improvement, repeat blood cultures   Height: 5\' 10"  (177.8 cm) Weight: 173 lb 1 oz (78.5 kg) IBW/kg (Calculated) : 73  Temp (24hrs), Avg:98.7 F (37.1 C), Min:98.2 F (36.8 C), Max:99.6 F (37.6 C)  Recent Labs  Lab 10/06/17 2243 10/06/17 2300 10/07/17 0130 10/08/17 0617  WBC 13.6*  --   --  15.0*  CREATININE 1.97*  --   --  1.17  LATICACIDVEN  --  1.80 1.32  --     Estimated Creatinine Clearance: 65.9 mL/min (by C-G formula based on SCr of 1.17 mg/dL).    No Known Allergies  Antimicrobials this admission: zyvox 2/1 zerbaxa 2/1 >2/1 Avycaz 2/1 >> Vancomycin 2/1 >       Microbiology results: 1/31 Bcx 2/2 GPC - lab called [facility] with MRSA, GNR - per lab - proteus and ecoli  Sharin MonsEmily Sinclair, PharmD, BCPS PGY2 Infectious Diseases Pharmacy Resident Pager: [phone]  10/08/2017 9:44 AM

## 2017-10-08 NOTE — Progress Notes (Signed)
INFECTIOUS DISEASE PROGRESS NOTE  ID: Craig Oneal. is a 67 y.o. male with  Active Problems:   AKI (acute kidney injury) ([place])   Chronic respiratory failure (HCC)   Septic shock (HCC)   Chronic obstructive pulmonary disease (HCC)   History of Clostridium difficile infection   Pressure injury of skin   MRSA bacteremia   E coli bacteremia   Bacterial infection due to Proteus mirabilis   Infection of peripherally inserted central venous catheter (PICC)   History of MDR Pseudomonas aeruginosa infection  Subjective: On Trach collar On norepi.   Abtx:  Anti-infectives (From admission, onward)   Start     Dose/Rate Route Frequency Ordered Stop   10/08/17 1800  vancomycin (VANCOCIN) IVPB 1000 mg/200 mL premix     1,000 mg 200 mL/hr over 60 Minutes Intravenous Every 12 hours 10/08/17 0949     10/08/17 1400  ceftazidime-avibactam (AVYCAZ) 2.5 g in dextrose 5 % 50 mL IVPB     2.5 g 25 mL/hr over 2 Hours Intravenous Every 8 hours 10/08/17 0948     10/07/17 2200  linezolid (ZYVOX) IVPB 600 mg  Status:  Discontinued     600 mg 300 mL/hr over 60 Minutes Intravenous Every 12 hours 10/07/17 1609 10/07/17 1640   10/07/17 1800  ceftolozane-tazobactam (ZERBAXA) 750 mg in sodium chloride 0.9 % 100 mL IVPB  Status:  Discontinued     750 mg 105.7 mL/hr over 60 Minutes Intravenous Every 8 hours 10/07/17 1341 10/07/17 1706   10/07/17 1800  vancomycin (VANCOCIN) IVPB 750 mg/150 ml premix  Status:  Discontinued     750 mg 150 mL/hr over 60 Minutes Intravenous Every 12 hours 10/07/17 1640 10/08/17 0949   10/07/17 1800  ceftazidime-avibactam (AVYCAZ) 1.25 g in dextrose 5 % 50 mL IVPB  Status:  Discontinued     1.25 g 25 mL/hr over 2 Hours Intravenous Every 8 hours 10/07/17 1707 10/08/17 0948   10/07/17 1400  linezolid (ZYVOX) tablet 600 mg  Status:  Discontinued     600 mg Per Tube Every 12 hours 10/07/17 1341 10/07/17 1609   10/07/17 0515  vancomycin (VANCOCIN) 50 mg/mL oral solution 125 mg   Status:  Discontinued     125 mg Oral 4 times daily 10/07/17 0501 10/07/17 0844   10/07/17 0100  linezolid (ZYVOX) IVPB 600 mg     600 mg 300 mL/hr over 60 Minutes Intravenous  Once 10/07/17 0059 10/07/17 0220   10/07/17 0100  ceftolozane-tazobactam (ZERBAXA) 750 mg in sodium chloride 0.9 % 100 mL IVPB     750 mg 105.7 mL/hr over 60 Minutes Intravenous Every 8 hours 10/07/17 0059 10/07/17 1122      Medications:  Scheduled: . chlorhexidine gluconate (MEDLINE KIT)  15 mL Mouth Rinse BID  . feeding supplement (PRO-STAT SUGAR FREE 64)  30 mL Per Tube BID  . insulin aspart  0-9 Units Subcutaneous Q4H  . ipratropium  0.5 mg Nebulization Q6H  . levalbuterol  0.63 mg Nebulization Q6H  . mouth rinse  15 mL Mouth Rinse QID  . pantoprazole sodium  40 mg Per Tube QHS    Objective: Vital signs in last 24 hours: Temp:  [98.2 F (36.8 C)-99.6 F (37.6 C)] 99.6 F (37.6 C) (02/02 0735) Pulse Rate:  [41-124] 109 (02/02 1000) Resp:  [14-31] 26 (02/02 1000) BP: (77-112)/(45-72) 107/60 (02/02 1000) SpO2:  [93 %-100 %] 93 % (02/02 1000) FiO2 (%):  [40 %-50 %] 40 % (02/02 0800) Weight:  [  78.5 kg (173 lb 1 oz)] 78.5 kg (173 lb 1 oz) (02/02 0456)   General appearance: alert and no distress Resp: rhonchi bilaterally Cardio: regular rate and rhythm GI: normal findings: soft, non-tender and abnormal findings:  distended and hypoactive bowel sounds Extremities: PIC in RUE is non-tender.   Lab Results Recent Labs    10/06/17 2243 10/08/17 0617  WBC 13.6* 15.0*  HGB 8.0* 7.4*  HCT 27.4* 25.3*  NA 136 139  K 4.3 3.3*  CL 99* 104  CO2 25 25  BUN 85* 67*  CREATININE 1.97* 1.17   Liver Panel Recent Labs    10/06/17 2243  PROT 7.4  ALBUMIN 2.0*  AST 91*  ALT 90*  ALKPHOS 537*  BILITOT 1.2   Sedimentation Rate No results for input(s): ESRSEDRATE in the last 72 hours. C-Reactive Protein No results for input(s): CRP in the last 72 hours.  Microbiology: Recent Results (from the  past 240 hour(s))  Urine culture     Status: Abnormal   Collection Time: 10/06/17 10:43 PM  Result Value Ref Range Status   Specimen Description URINE, RANDOM  Final   Special Requests   Final    NONE Performed at [facility], [address] [place], [place] [address]    Culture MULTIPLE SPECIES PRESENT, SUGGEST RECOLLECTION (A)  Final   Report Status 10/08/2017 FINAL  Final  Blood culture (routine x 2)     Status: Abnormal (Preliminary result)   Collection Time: 10/06/17 10:46 PM  Result Value Ref Range Status   Specimen Description BLOOD LEFT FOREARM  Final   Special Requests   Final    BOTTLES DRAWN AEROBIC AND ANAEROBIC Blood Culture adequate volume   Culture  Setup Time   Final    GRAM NEGATIVE RODS GRAM POSITIVE COCCI IN PAIRS IN CLUSTERS IN BOTH AEROBIC AND ANAEROBIC BOTTLES CRITICAL RESULT CALLED TO, READ BACK BY AND VERIFIED WITH: J MILLEN,PHARMD AT 1534 10/07/17 BY L BENFIELD    Culture (A)  Final    STAPHYLOCOCCUS AUREUS ESCHERICHIA COLI SUSCEPTIBILITIES TO FOLLOW GRAM NEGATIVE RODS CULTURE REINCUBATED FOR BETTER GROWTH Performed at [facility], [place] [address] [place], [place] [address]    Report Status PENDING  Incomplete  Blood Culture ID Panel (Reflexed)     Status: Abnormal   Collection Time: 10/06/17 10:46 PM  Result Value Ref Range Status   Enterococcus species NOT DETECTED NOT DETECTED Final   Listeria monocytogenes NOT DETECTED NOT DETECTED Final   Staphylococcus species DETECTED (A) NOT DETECTED Final    Comment: CRITICAL RESULT CALLED TO, READ BACK BY AND VERIFIED WITH: J MILLEN,PHARMD AT 1534 10/08/17 BY L BENFIELD    Staphylococcus aureus DETECTED (A) NOT DETECTED Final    Comment: Methicillin (oxacillin)-resistant Staphylococcus aureus (MRSA). MRSA is predictably resistant to beta-lactam antibiotics (except ceftaroline). Preferred therapy is vancomycin unless clinically contraindicated. Patient requires contact precautions if    hospitalized. CRITICAL RESULT CALLED TO, READ BACK BY AND VERIFIED WITH: J MILLEN,PHARMD AT 1534 10/08/17 BY L BENFIELD    Methicillin resistance DETECTED (A) NOT DETECTED Final    Comment: CRITICAL RESULT CALLED TO, READ BACK BY AND VERIFIED WITH: J MILLEN,PHARMD AT 1534 10/08/17 BY L BENFIELD    Streptococcus species NOT DETECTED NOT DETECTED Final   Streptococcus agalactiae NOT DETECTED NOT DETECTED Final   Streptococcus pneumoniae NOT DETECTED NOT DETECTED Final   Streptococcus pyogenes NOT DETECTED NOT DETECTED Final   Acinetobacter baumannii NOT DETECTED NOT DETECTED Final  Enterobacteriaceae species DETECTED (A) NOT DETECTED Final    Comment: CRITICAL RESULT CALLED TO, READ BACK BY AND VERIFIED WITH: J MILLEN,PHARMD AT 1534 10/08/17 BY L BENFIELD    Enterobacter cloacae complex NOT DETECTED NOT DETECTED Final   Escherichia coli DETECTED (A) NOT DETECTED Final    Comment: CRITICAL RESULT CALLED TO, READ BACK BY AND VERIFIED WITH: J MILLEN,PHARMD AT 1534 10/08/17 BY L BENFIELD    Klebsiella oxytoca NOT DETECTED NOT DETECTED Final   Klebsiella pneumoniae NOT DETECTED NOT DETECTED Final   Proteus species DETECTED (A) NOT DETECTED Final    Comment: CRITICAL RESULT CALLED TO, READ BACK BY AND VERIFIED WITH: J MILLEN,PHARMD AT 1534 10/08/17 BY L BENFIELD    Serratia marcescens NOT DETECTED NOT DETECTED Final   Carbapenem resistance NOT DETECTED NOT DETECTED Final   Haemophilus influenzae NOT DETECTED NOT DETECTED Final   Neisseria meningitidis NOT DETECTED NOT DETECTED Final   Pseudomonas aeruginosa NOT DETECTED NOT DETECTED Final   Candida albicans NOT DETECTED NOT DETECTED Final   Candida glabrata NOT DETECTED NOT DETECTED Final   Candida krusei NOT DETECTED NOT DETECTED Final   Candida parapsilosis NOT DETECTED NOT DETECTED Final   Candida tropicalis NOT DETECTED NOT DETECTED Final    Comment: Performed at [facility], [place]. [address] [place], [place] [address]  Blood  culture (routine x 2)     Status: Abnormal (Preliminary result)   Collection Time: 10/06/17 11:00 PM  Result Value Ref Range Status   Specimen Description BLOOD RIGHT ARM  Final   Special Requests   Final    BOTTLES DRAWN AEROBIC AND ANAEROBIC Blood Culture adequate volume   Culture  Setup Time   Final    GRAM POSITIVE COCCI IN CLUSTERS IN BOTH AEROBIC AND ANAEROBIC BOTTLES CRITICAL VALUE NOTED.  VALUE IS CONSISTENT WITH PREVIOUSLY REPORTED AND CALLED VALUE. Performed at [facility], [place] [address] [place], [place] [address]    Culture STAPHYLOCOCCUS AUREUS (A)  Final   Report Status PENDING  Incomplete  C difficile quick scan w PCR reflex     Status: None   Collection Time: 10/07/17  4:20 AM  Result Value Ref Range Status   C Diff antigen NEGATIVE NEGATIVE Final   C Diff toxin NEGATIVE NEGATIVE Final   C Diff interpretation No C. difficile detected.  Final    Studies/Results: Dg Chest Port 1 View  Result Date: 10/06/2017 CLINICAL DATA:  Fever and hypotension. Low blood pressure. History of COPD. EXAM: PORTABLE CHEST 1 VIEW COMPARISON:  09/21/2017 FINDINGS: Endotracheal tube is in place. Right PICC line with tip over the cavoatrial junction region. No pneumothorax. Cardiac enlargement. Mild interstitial pattern to the lungs suggesting mild edema. Blunting of the costophrenic angles suggesting tiny bilateral pleural effusions. Similar appearance to previous study. No developing consolidation. No pneumothorax. IMPRESSION: Appliances appear in satisfactory position. Cardiac enlargement with mild interstitial pulmonary edema and small bilateral pleural effusions, similar to previous study. Electronically Signed   By: Lucienne Capers M.D.   On: 10/06/2017 23:32     Assessment/Plan: MRSA bacteremia 2/2 Polymicrobial bacteremia (E coli, Proteus) 1/2 Paraplegia Tracheostomy CKD with AKI Sacral wound Anemia Prev candidemia, previous MDR pseudomonas pna (09-19-17)   Total days  of antibiotics: 1 vanco/zerbaxa  Appreciate the outstanding help of pharmacy, adjusting meds with change in CrCl.  Kidney function improved CXR unchanged Will repeat BCx sensi pending, no change in anbx for now.  Hopefully able to come off pressors soon? Is  PIC new or present prior to adm?  Would consider pulling PIC (placed 1-13 at [facility]).          Bobby Rumpf MD, FACP Infectious Diseases (pager) [phone] [email or web address] 10/08/2017, 10:22 AM  LOS: 1 day

## 2017-10-08 NOTE — Progress Notes (Signed)
ANTICOAGULATION CONSULT NOTE - Follow up Consult  Pharmacy Consult for Heparin Indication: atrial fibrillation  No Known Allergies  Patient Measurements: Height: 5\' 10"  (177.8 cm) Weight: 173 lb 1 oz (78.5 kg) IBW/kg (Calculated) : 73  Heparin DW: 68.6   Vital Signs: Temp: 99.2 F (37.3 C) (02/02 1119) Temp Source: Oral (02/02 1119) BP: 92/66 (02/02 1400) Pulse Rate: 113 (02/02 1500)  Labs: Recent Labs    10/06/17 2243 10/07/17 0400 10/07/17 1550 10/08/17 0617 10/08/17 1410  HGB 8.0*  --   --  7.4*  --   HCT 27.4*  --   --  25.3*  --   PLT 242  --   --  225  --   LABPROT  --  17.5*  --   --   --   INR  --  1.45  --   --   --   HEPARINUNFRC  --   --  <0.10* <0.10* <0.10*  CREATININE 1.97*  --   --  1.17  --     Estimated Creatinine Clearance: 65.9 mL/min (by C-G formula based on SCr of 1.17 mg/dL).   Medical History: Past Medical History:  Diagnosis Date  . Cerebellar atrophy   . Chronic indwelling Foley catheter   . Chronic kidney disease (CKD), stage IV (severe) (HCC)   . COPD (chronic obstructive pulmonary disease) (HCC)   . Paraplegia (HCC)   . Paroxysmal atrial fibrillation (HCC)   . PEG (percutaneous endoscopic gastrostomy) status (HCC)   . Systolic heart failure (HCC)   . Tracheostomy in place [facility](HCC)   . 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 male admitted with sepsis, h/o Afib started on  Heparin drip.   INR 1.45 on admit, [facility]. Heparin level this afternoon remains undetectable. Per nurse has been infusing into PICC line without any issues    Goal of Therapy:  Heparin level 0.3-0.7 units/ml Monitor platelets by anticoagulation protocol: Yes   Plan:  -Increase heparin to 1700 units/hr -6-hr heparin level -Daily heparin level, CBC -AT III level  -Watch S/Sx bleeding closely   Sharin MonsEmily Sinclair, PharmD, BCPS PGY2 Infectious Diseases Pharmacy Resident Pager: [phone]   10/08/2017

## 2017-10-08 NOTE — Progress Notes (Signed)
ANTICOAGULATION CONSULT NOTE - Follow up [place] for Heparin Indication: atrial fibrillation  No Known Allergies  Patient Measurements: Height: 5' 10"  (177.8 cm) Weight: 173 lb 1 oz (78.5 kg) IBW/kg (Calculated) : 73  Vital Signs: Temp: 98.8 F (37.1 C) (02/02 0357) Temp Source: Oral (02/02 0357) BP: 97/59 (02/02 0645) Pulse Rate: 98 (02/02 0645)  Labs: Recent Labs    10/06/17 2243 10/07/17 0400 10/07/17 1550 10/08/17 0617  HGB 8.0*  --   --  7.4*  HCT 27.4*  --   --  25.3*  PLT 242  --   --  225  LABPROT  --  17.5*  --   --   INR  --  1.45  --   --   HEPARINUNFRC  --   --  <0.10* <0.10*  CREATININE 1.97*  --   --  1.17    Estimated Creatinine Clearance: 65.9 mL/min (by C-G formula based on SCr of 1.17 mg/dL).   Medical History: Past Medical History:  Diagnosis Date  . Cerebellar atrophy   . Chronic indwelling Foley catheter   . Chronic kidney disease (CKD), stage IV (severe) ([place])   . COPD (chronic obstructive pulmonary disease) ([place])   . Paraplegia ([place])   . Paroxysmal atrial fibrillation (HCC)   . PEG (percutaneous endoscopic gastrostomy) status ([place])   . Systolic heart failure ([place])   . Tracheostomy in place [facility])   . Unilateral AKA, right (HCC)     Medications:  Medications Prior to Admission  Medication Sig Dispense Refill Last Dose  . acetaminophen (TYLENOL) 325 MG tablet Place 650 mg into feeding tube every 6 (six) hours as needed for mild pain (temp greater than than 100.6).    unk  . amikacin (AMIKIN) IVPB Inject 150 mg into the vein daily. For 10 days  Started date 10/06/17   unk  . Amino Acids-Protein Hydrolys (FEEDING SUPPLEMENT, PRO-STAT SUGAR FREE 64,) LIQD Place 30 mLs into feeding tube 2 (two) times daily. 900 mL 0 unk  . atorvastatin (LIPITOR) 10 MG tablet Place 1 tablet (10 mg total) into feeding tube at bedtime. 30 tablet 0 unk  . baclofen 5 MG TABS Place 5 mg into feeding tube 2 (two) times daily. 10 each 0 unk  .  ceFEPIme 2 g in dextrose 5 % 50 mL Inject 2 g into the vein every 12 (twelve) hours.   unk  . chlorhexidine gluconate, MEDLINE KIT, (PERIDEX) 0.12 % solution 15 mLs by Mouth Rinse route 2 (two) times daily. 120 mL 0 unk  . collagenase (SANTYL) ointment Apply topically daily. Apply Santyl to right buttock wound Q day, then cover with moist 2X2 and foam dressing.  (Change foam dressing Q 3 days or PRN soiling.) 15 g 0 unk  . Darbepoetin Alfa 25 MCG/ML SOLN Inject 25 mcg as directed every 28 (twenty-eight) days.    unk  . Dextrose-Sodium Chloride (DEXTROSE 5 % AND 0.45% NACL) 5-0.45 % Inject 84 mLs into the vein daily.   unk  . diltiazem (CARDIZEM) 30 MG tablet 30 mg every 6 (six) hours. Per tube   unk  . enoxaparin (LOVENOX) 80 MG/0.8ML injection Inject 0.75 mLs (75 mg total) into the skin every 12 (twelve) hours. (Patient taking differently: Inject 40 mg into the skin every 12 (twelve) hours. ) 48 Syringe 0 unk  . fluticasone (FLONASE) 50 MCG/ACT nasal spray Place 2 sprays into both nostrils daily.    unk  . glycopyrrolate (ROBINUL) 1 MG tablet  Place 1 tablet (1 mg total) into feeding tube 2 (two) times daily. 30 tablet 0 unk  . INSULIN ASPART [place] Inject 0-10 Units into the skin daily as needed (sugar levels). 0-150= 0 units 151-200= 2 units 201-250= 4 units 251-300= 6 units 301-350= 8 units 351-400= 10 units >400 call MD   unk  . ipratropium-albuterol (DUONEB) 0.5-2.5 (3) MG/3ML SOLN Take 3 mLs by nebulization 3 (three) times daily. 360 mL 0 unk  . lactulose (CHRONULAC) 10 GM/15ML solution Take 15 mLs (10 g total) by mouth 2 (two) times daily as needed for severe constipation. 240 mL 0 unk  . levofloxacin (LEVAQUIN) 500 MG/100ML SOLN Inject 500 mg into the vein daily.   unk  . levothyroxine (SYNTHROID, LEVOTHROID) 125 MCG tablet Place 1 tablet (125 mcg total) into feeding tube daily before breakfast. 30 tablet 0 unk  . linezolid (ZYVOX) 600 MG/300ML IVPB Inject 300 mLs (600 mg total) into the vein  every 12 (twelve) hours. 3600 mL 0 unk  . loratadine (CLARITIN) 10 MG tablet Place 10 mg into feeding tube daily.    unk  . metoprolol tartrate (LOPRESSOR) 25 MG tablet Place 0.5 tablets (12.5 mg total) into feeding tube 2 (two) times daily.   unk  . montelukast (SINGULAIR) 10 MG tablet Place 10 mg into feeding tube at bedtime.    unk  . Multiple Vitamin (MULTIVITAMIN WITH MINERALS) TABS tablet Place 1 tablet into feeding tube daily.    unk  . Nutritional Supplements (FEEDING SUPPLEMENT, VITAL AF 1.2 CAL,) LIQD Place 1,000 mLs into feeding tube continuous. (Patient taking differently: Place 70 mLs into feeding tube daily. Rate of 68m/hr run from 0600 until 1680) 1500 mL 0 unk  . oxyCODONE (OXY IR/ROXICODONE) 5 MG immediate release tablet 1 tablet (5 mg total) by Per J Tube route every 6 (six) hours as needed for moderate pain. 10 tablet 0 unk  . pantoprazole sodium (PROTONIX) 40 mg/20 mL PACK Place 20 mLs (40 mg total) into feeding tube daily. 30 each 0 unk  . simethicone (MYLICON) 40 mTF/5.7DUSUSP Place 0.6 mLs (40 mg total) into feeding tube every 6 (six) hours as needed for flatulence. 15 mL 0 unk  . sodium chloride irrigation 0.9 % irrigation Irrigate with 500 mLs as directed once.   unk  . Vitamin D, Ergocalciferol, (DRISDOL) 50000 units CAPS capsule Place 50,000 Units into feeding tube every Friday.    unk  . warfarin (COUMADIN) 10 MG tablet Take 1 tablet (10 mg total) by mouth daily at 6 PM. (Patient taking differently: Place 5 mg into feeding tube daily at 6 PM. ) 30 tablet 0 unk  . Water For Irrigation, Sterile (FREE WATER) SOLN Place 200 mLs into feeding tube every 8 (eight) hours. (Patient taking differently: Place 200 mLs into feeding tube every 4 (four) hours. ) 1000 mL 0 unk  . ciprofloxacin (CIPRO) 500 MG/5ML (10%) suspension Take 7.5 mLs (750 mg total) by mouth 2 (two) times daily. (Patient not taking: Reported on 10/07/2017) 100 mL 0 Not Taking at Unknown time  . diltiazem (CARDIZEM) 10  mg/ml oral suspension Place 3 mLs (30 mg total) into feeding tube every 6 (six) hours. (Patient not taking: Reported on 10/07/2017) 250 mL 0 Not Taking at Unknown time  . fentaNYL (SUBLIMAZE) 100 MCG/2ML injection Inject 0.5-1 mLs (25-50 mcg total) into the vein every 2 (two) hours as needed for severe pain. (Patient not taking: Reported on 09/18/2017) 8 mL 0 Not Taking at Unknown time  Assessment: 67 y.o. male admitted with sepsis, h/o Afib started on  Heparin drip.   INR 1.45 on admit, was receiving Lovenox 40 mg SQ q12h at [facility]. Heparin level undetectable this morning on 1200 units/hr. Hgb down this morning, per RN no S/Sx bleeding and infusion has not been stopped at all.   Goal of Therapy:  Heparin level 0.3-0.7 units/ml Monitor platelets by anticoagulation protocol: Yes   Plan:  -Increase heparin 1450 units/hr -6-hr heparin level -Daily heparin level, CBC -Watch S/Sx bleeding closely   Arrie Senate, PharmD, BCPS PGY-2 Cardiology Pharmacy Resident Pager: [phone] 10/08/2017

## 2017-10-08 NOTE — Progress Notes (Signed)
PULMONARY / CRITICAL CARE MEDICINE   Name: Craig DiegoCharlie J Marsicano Jr. MRN: [id number] DOB: Sep 15, 1950    ADMISSION DATE:  10/06/2017 CONSULTATION DATE:  2/1  REFERRING MD:  Sharilyn SitesLisa Sanders PA-C ED  CHIEF COMPLAINT:  Shock  HISTORY OF PRESENT ILLNESS:   67 year old male with PMH as below, which is significant for cerebellar atrophy with associated paraplegia, chronic trach with nocturnal vent, and PEG.  History also significant for stage IV CKD, COPD, paroxysmal atrial fibrillation, systolic CHF with LVEF 40%, and right above-the-knee amputation.  He was recently admitted to [facility] for sepsis secondary to multidrug resistant pneumonia culture positive for Pseudomonas, Moraxella, and Serratia and multidrug resistant E. coli urinary tract infection.  He was initially managed with meropenem and ciprofloxacin.  Infectious diseases was consulted and antibiotics were changed to ciprofloxacin via PEG. He was discharged to [facility] on this regimen until 1/22. Then 1/30 he developed fevers and was felt to possibly have developed pneumonia again. He was started on amikacin and levofloxacin for this. 1/31 fevers worsened up to 103F and he became hypotensive, which persisted despite IVF bolus at [facility]. He was transported to ED for further evaluation.   Upon arrival to the ED he was persistently hypotensive despite 3L IVF resuscitation and started on norepinephrine. Laboratory evaluation significant for WBC 13.6, hemoglobin 8.0, creatinine 1.97, AST 91, lactic wnl, UA with moderate leukocytes and many bacteria. Nitrites negative. Due to need for pressors PCCM asked to see.   SUBJECTIVE:  Was able to wean to trach collar yesterday x 4 hr .  Remains on pressor support .  Awake this am, following commands.   VITAL SIGNS: BP 101/60   Pulse 66   Temp 99.6 F (37.6 C) (Oral)   Resp 20   Ht 5\' 10"  (1.778 m)   Wt 173 lb 1 oz (78.5 kg)   SpO2 100%   BMI 24.83 kg/m   HEMODYNAMICS: CVP:  [10 mmHg-12 mmHg] 10  mmHg  VENTILATOR SETTINGS: Vent Mode: PRVC FiO2 (%):  [40 %-50 %] 40 % Set Rate:  [15 bmp] 15 bmp Vt Set:  [500 mL] 500 mL PEEP:  [5 cmH20] 5 cmH20 Pressure Support:  [5 cmH20] 5 cmH20 Plateau Pressure:  [14 cmH20] 14 cmH20  INTAKE / OUTPUT: I/O last 3 completed shifts: In: 8888.2 [I.V.:6010.7; NG/GT:966.2; IV Piggyback:1911.4] Out: 1780 [Urine:1780]  PHYSICAL EXAMINATION: General:  Chronically ill appearing male on vent  Neuro:  Awake, f/c paraplegic  HEENT:  [place]/AT, PERRL ,  Cardiovascular:  Irreg/ no m/r/g  Lungs:  Coarse BS diffusely Abdomen:  Soft, non-tender, non-distended, BS + , Peg  Musculoskeletal:  RLE AKA remote Skin:  Reported sacral ulcer   LABS:  BMET Recent Labs  Lab 10/06/17 2243 10/08/17 0617  NA 136 139  K 4.3 3.3*  CL 99* 104  CO2 25 25  BUN 85* 67*  CREATININE 1.97* 1.17  GLUCOSE 128* 190*   Electrolytes Recent Labs  Lab 10/06/17 2243 10/08/17 0617  CALCIUM 8.2* 8.1*  MG  --  2.1  PHOS  --  1.8*   CBC Recent Labs  Lab 10/06/17 2243 10/08/17 0617  WBC 13.6* 15.0*  HGB 8.0* 7.4*  HCT 27.4* 25.3*  PLT 242 225   Coag's Recent Labs  Lab 10/07/17 0400  INR 1.45   Sepsis Markers Recent Labs  Lab 10/06/17 2300 10/07/17 0130 10/07/17 0400  LATICACIDVEN 1.80 1.32  --   PROCALCITON  --   --  45.77   ABG  Recent Labs  Lab 10/06/17 2332 10/07/17 0414  PHART 7.331* 7.361  PCO2ART 29.7* 44.9  PO2ART 62.0* 176.0*   Liver Enzymes Recent Labs  Lab 10/06/17 2243  AST 91*  ALT 90*  ALKPHOS 537*  BILITOT 1.2  ALBUMIN 2.0*   Cardiac Enzymes No results for input(s): TROPONINI, PROBNP in the last 168 hours.  Glucose Recent Labs  Lab 10/07/17 1132 10/07/17 1506 10/07/17 1928 10/07/17 2332 10/08/17 0356 10/08/17 0742  GLUCAP 132* 188* 204* 163* 177* 191*   Imaging No results found. STUDIES:   CULTURES: Blood 2/1 > Urine 2/1 > Sputum 2/1 > C Diff 2/1 >neg    ANTIBIOTICS: Ciprofloxacin >>>1/22 Amikacin  1/30 >1/31 Levofloxacin 1/30 >1/31 Zerbaxa 2/1 >>>2/1 Linezolid 2/1 >>>2/1 Vanc 2/1 >> Avycaz 2/1>>  SIGNIFICANT EVENTS: 1/13 > 1/20 admit for PNA, UTI  LINES/TUBES: PICC RUE 1/18 >>> Trach 07/2016>>>  DISCUSSION: 67 year old male with paraplegia secondary to cerebellar atrophy. [facility] resident for chronic trach, nocturnal vent. Recently admitted for PNA, UTI with multiple organisms with multiple drug resistances. He was discharged 1/20 now is back with fevers and hypotension from what is presumed to be septic shock. He was started on pressors and broad spectrum antibiotics. Admitted to ICU.  ASSESSMENT / PLAN:  PULMONARY A: Chronic respiratory failure  COPD without acute exacerbation ? PNA Small bilateral pleural effusions  P:   Trach collar as able  Vent at bedtime  VAP bundle Scheduled xopenex, atrovent See ID Check CXR in am   CARDIOVASCULAR A:  Septic shock Atrial fibrillation with RVR-improved  Chronic systolic CHF (EF 16-10%)   P:  MAP goal > Titrate Norepinephrine titrated to MAP goal Telemetry monitoring Heparin infusion in lieu of home warfarin Holding home metoprolol, diltiazem Amiodarone drip  RENAL A:   CKD IV reported although baseline creatinine 0.8 on recent admit AKI Hypokalemia  Hypophos +7 L I/O bal since admission   P:   S/p hydration Follow BMP Replace electrolytes as indicated BMET in AM Replace K PHOS  Decrease LR KVO   GASTROINTESTINAL A:   Tranasminitis  Diarrhea-C Diff neg   P:   TF for nutrition Protonix for SUP  HEMATOLOGIC A:   Anemia hemoglobin at about baseline  P:  Heparin infusion Follow CBC   INFECTIOUS A:   Presumed septic shock- BC + (GPC2/2 ) , BCID +MRSA/proteus/ EColi  Suspect urinary source, also recent resistant PNA-ID consulted 2/1  PCT 45   P:   Cont IV abx  Appreciate ID recommendations to d/c central line and consider TEE next week.  Tr PCT   ENDOCRINE A:   Hyperglycemia     P:    SSI   NEUROLOGIC A:   Paraplegia secondary to cerebellar atrophy.   P:   RASS goal: 0 PRN fentanyl for analgesia, sedation if needed   FAMILY  - Updates: No family bedside 2/1  - Inter-disciplinary family meet or Palliative Care meeting due by:  2/6  Almadelia Looman NP-C  [facility]  [phone]   10/08/2017 7:49 AM

## 2017-10-08 NOTE — Progress Notes (Addendum)
ANTICOAGULATION CONSULT NOTE  Pharmacy Consult for Heparin Indication: atrial fibrillation  No Known Allergies  Patient Measurements: Height: 5\' 10"  (177.8 cm) Weight: 173 lb 1 oz (78.5 kg) IBW/kg (Calculated) : 73  Vital Signs: Temp: 98.4 F (36.9 C) (02/02 1936) Temp Source: Oral (02/02 1936) BP: 93/72 (02/02 2000) Pulse Rate: 104 (02/02 2000)  Labs: Recent Labs    10/06/17 2243 10/07/17 0400  10/08/17 0617 10/08/17 1410 10/08/17 2203  HGB 8.0*  --   --  7.4*  --   --   HCT 27.4*  --   --  25.3*  --   --   PLT 242  --   --  225  --   --   LABPROT  --  17.5*  --   --   --   --   INR  --  1.45  --   --   --   --   HEPARINUNFRC  --   --    < > <0.10* <0.10* 0.11*  CREATININE 1.97*  --   --  1.17  --   --    < > = values in this interval not displayed.    Estimated Creatinine Clearance: 65.9 mL/min (by C-G formula based on SCr of 1.17 mg/dL).  Assessment: 67 y.o. male admitted with sepsis, h/o Afib, for heparin.   Goal of Therapy:  Heparin level 0.3-0.7 units/ml Monitor platelets by anticoagulation protocol: Yes   Plan:  Increase Heparin 2000 units/hr Follow-up am labs.    Omarri Eich, Gary FleetGregory Vernon 10/08/2017,10:59 PM   Addendum: Recheck this morning 0.2 Will increase heparin 2200 units/hr F/U repeat level later this evening with vancomycin trough  Geannie RisenGreg Ciel Yanes, PharmD, BCPS 10/09/2017 6:56 AM

## 2017-10-09 ENCOUNTER — Inpatient Hospital Stay (HOSPITAL_COMMUNITY): Payer: Medicare Other

## 2017-10-09 DIAGNOSIS — B9620 Unspecified Escherichia coli [E. coli] as the cause of diseases classified elsewhere: Secondary | ICD-10-CM | POA: Diagnosis not present

## 2017-10-09 DIAGNOSIS — B964 Proteus (mirabilis) (morganii) as the cause of diseases classified elsewhere: Secondary | ICD-10-CM | POA: Diagnosis not present

## 2017-10-09 DIAGNOSIS — A419 Sepsis, unspecified organism: Secondary | ICD-10-CM | POA: Diagnosis not present

## 2017-10-09 DIAGNOSIS — I517 Cardiomegaly: Secondary | ICD-10-CM

## 2017-10-09 DIAGNOSIS — R6521 Severe sepsis with septic shock: Secondary | ICD-10-CM | POA: Diagnosis not present

## 2017-10-09 DIAGNOSIS — J9610 Chronic respiratory failure, unspecified whether with hypoxia or hypercapnia: Secondary | ICD-10-CM | POA: Diagnosis not present

## 2017-10-09 DIAGNOSIS — A4102 Sepsis due to Methicillin resistant Staphylococcus aureus: Secondary | ICD-10-CM | POA: Diagnosis not present

## 2017-10-09 DIAGNOSIS — I509 Heart failure, unspecified: Secondary | ICD-10-CM

## 2017-10-09 DIAGNOSIS — Z1612 Extended spectrum beta lactamase (ESBL) resistance: Secondary | ICD-10-CM | POA: Diagnosis not present

## 2017-10-09 DIAGNOSIS — N179 Acute kidney failure, unspecified: Secondary | ICD-10-CM | POA: Diagnosis not present

## 2017-10-09 DIAGNOSIS — N189 Chronic kidney disease, unspecified: Secondary | ICD-10-CM | POA: Diagnosis not present

## 2017-10-09 DIAGNOSIS — D649 Anemia, unspecified: Secondary | ICD-10-CM | POA: Diagnosis not present

## 2017-10-09 DIAGNOSIS — R7881 Bacteremia: Secondary | ICD-10-CM | POA: Diagnosis not present

## 2017-10-09 DIAGNOSIS — L89159 Pressure ulcer of sacral region, unspecified stage: Secondary | ICD-10-CM | POA: Diagnosis not present

## 2017-10-09 LAB — GLUCOSE, CAPILLARY
GLUCOSE-CAPILLARY: 118 mg/dL — AB (ref 65–99)
GLUCOSE-CAPILLARY: 123 mg/dL — AB (ref 65–99)
GLUCOSE-CAPILLARY: 133 mg/dL — AB (ref 65–99)
GLUCOSE-CAPILLARY: 141 mg/dL — AB (ref 65–99)
GLUCOSE-CAPILLARY: 146 mg/dL — AB (ref 65–99)
Glucose-Capillary: 117 mg/dL — ABNORMAL HIGH (ref 65–99)
Glucose-Capillary: 111 mg/dL — ABNORMAL HIGH (ref 65–99)

## 2017-10-09 LAB — CBC
HCT: 24.3 % — ABNORMAL LOW (ref 39.0–52.0)
Hemoglobin: 7.4 g/dL — ABNORMAL LOW (ref 13.0–17.0)
MCH: 24.3 pg — AB (ref 26.0–34.0)
MCHC: 30.5 g/dL (ref 30.0–36.0)
MCV: 79.7 fL (ref 78.0–100.0)
PLATELETS: 184 10*3/uL (ref 150–400)
RBC: 3.05 MIL/uL — AB (ref 4.22–5.81)
RDW: 18.3 % — AB (ref 11.5–15.5)
WBC: 14.4 10*3/uL — AB (ref 4.0–10.5)

## 2017-10-09 LAB — VANCOMYCIN, TROUGH: Vancomycin Tr: 28 ug/mL (ref 15–20)

## 2017-10-09 LAB — CULTURE, BLOOD (ROUTINE X 2): SPECIAL REQUESTS: ADEQUATE

## 2017-10-09 LAB — HEPARIN LEVEL (UNFRACTIONATED)
HEPARIN UNFRACTIONATED: 0.2 [IU]/mL — AB (ref 0.30–0.70)
HEPARIN UNFRACTIONATED: 0.34 [IU]/mL (ref 0.30–0.70)

## 2017-10-09 LAB — COMPREHENSIVE METABOLIC PANEL
ALT: 160 U/L — AB (ref 17–63)
AST: 137 U/L — AB (ref 15–41)
Albumin: 1.7 g/dL — ABNORMAL LOW (ref 3.5–5.0)
Alkaline Phosphatase: 736 U/L — ABNORMAL HIGH (ref 38–126)
Anion gap: 11 (ref 5–15)
BUN: 59 mg/dL — ABNORMAL HIGH (ref 6–20)
CHLORIDE: 103 mmol/L (ref 101–111)
CO2: 25 mmol/L (ref 22–32)
CREATININE: 0.99 mg/dL (ref 0.61–1.24)
Calcium: 8.2 mg/dL — ABNORMAL LOW (ref 8.9–10.3)
GFR calc non Af Amer: >60 mL/min (ref >60)
Glucose, Bld: 132 mg/dL — ABNORMAL HIGH (ref 65–99)
POTASSIUM: 3.5 mmol/L (ref 3.5–5.1)
SODIUM: 139 mmol/L (ref 135–145)
Total Bilirubin: 1.3 mg/dL — ABNORMAL HIGH (ref 0.3–1.2)
Total Protein: 6.9 g/dL (ref 6.5–8.1)

## 2017-10-09 LAB — LACTIC ACID, PLASMA: Lactic Acid, Venous: 1.2 mmol/L (ref 0.5–1.9)

## 2017-10-09 LAB — PHOSPHORUS: PHOSPHORUS: 1.8 mg/dL — AB (ref 2.5–4.6)

## 2017-10-09 LAB — PROCALCITONIN: PROCALCITONIN: 21.68 ng/mL

## 2017-10-09 MED ORDER — MEROPENEM 1 G IV SOLR
1.0000 g | Freq: Three times a day (TID) | INTRAVENOUS | Status: DC
  Administered 2017-10-09 – 2017-10-11 (×6): 1 g via INTRAVENOUS
  Filled 2017-10-09 (×7): qty 1

## 2017-10-09 MED ORDER — VANCOMYCIN HCL IN DEXTROSE 750-5 MG/150ML-% IV SOLN
750.0000 mg | Freq: Two times a day (BID) | INTRAVENOUS | Status: DC
  Administered 2017-10-10 – 2017-10-11 (×3): 750 mg via INTRAVENOUS
  Filled 2017-10-09 (×4): qty 150

## 2017-10-09 NOTE — Progress Notes (Addendum)
Pharmacy Antibiotic Note  Craig DiegoCharlie J Serio Jr. is a 67 y.o. male admitted on 10/06/2017 from [facility] with hypotension and possible sepsis. Found to have bacteremia with MRSA, E. Coli and proteus species. Patient is CKD stage 4 but renal function has improved today to a Scr of 0.99. Patient is paraplegic so would expect to see lower SCr levels. E. Coli is ESBL but sensitive to carbapenems. Pharmacy has been consulted to start meropenem. WBC is down to 14.4 today and patient afebrile.    Plan: Meropenem 1 gm every 8 hours  Continue  Vancomycin to 1000 mg every 12 hours  Will check trough tonight  Monitor renal function, clinical s/sx of improvement, repeat blood cultures   Height: 5\' 10"  (177.8 cm) Weight: 176 lb 5.9 oz (80 kg) IBW/kg (Calculated) : 73  Temp (24hrs), Avg:98.4 F (36.9 C), Min:98.2 F (36.8 C), Max:98.6 F (37 C)  Recent Labs  Lab 10/06/17 2243 10/06/17 2300 10/07/17 0130 10/08/17 0617 10/09/17 0615  WBC 13.6*  --   --  15.0* 14.4*  CREATININE 1.97*  --   --  1.17 0.99  LATICACIDVEN  --  1.80 1.32  --  1.2    Estimated Creatinine Clearance: 77.8 mL/min (by C-G formula based on SCr of 0.99 mg/dL).    No Known Allergies  Cdiff- negative  Flu-negative 1/31 BCx: 2/2 MRSA , 1/2 E. Coli (proteus also detected on BCID) ( E.coli S- Merrem) 1/31 Urine: Multiple species  2/2 Repeat BCx: in process   2/1 Vancomycin>> 2/1 Avycaz>>2/3 2/3 Merrem>> 2/01 Zerbaxa>>2/1 2/01 PO Vanc X 1  2/01 Linezolid X 1   Craig Oneal, PharmD, BCPS PGY2 Infectious Diseases Pharmacy Resident Pager: [phone]  10/09/2017 12:21 PM    Addendum:  Vancomycin trough this evening came back elevated at 28. Will reduce dose to 750 mg every 12 hours. Tonight's dose has already been given so will reduce starting with the morning's dose.    Craig Oneal, PharmD, BCPS PGY2 Infectious Diseases Pharmacy Resident Pager: [phone]

## 2017-10-09 NOTE — Progress Notes (Signed)
PULMONARY / CRITICAL CARE MEDICINE   Name: Craig Oneal. MRN: [id number] DOB: 05-Feb-1951    ADMISSION DATE:  10/06/2017 CONSULTATION DATE:  2/1  REFERRING MD:  Sharilyn Sites PA-C ED  CHIEF COMPLAINT:  Shock  HISTORY OF PRESENT ILLNESS:   67 year old male with PMH as below, which is significant for cerebellar atrophy with associated paraplegia, chronic trach with nocturnal vent, and PEG.  History also significant for stage IV CKD, COPD, paroxysmal atrial fibrillation, systolic CHF with LVEF 40%, and right above-the-knee amputation.  He was recently admitted to [facility] for sepsis secondary to multidrug resistant pneumonia culture positive for Pseudomonas, Moraxella, and Serratia and multidrug resistant E. coli urinary tract infection.  He was initially managed with meropenem and ciprofloxacin.  Infectious diseases was consulted and antibiotics were changed to ciprofloxacin via PEG. He was discharged to [facility] on this regimen until 1/22. Then 1/30 he developed fevers and was felt to possibly have developed pneumonia again. He was started on amikacin and levofloxacin for this. 1/31 fevers worsened up to 103F and he became hypotensive, which persisted despite IVF bolus at [facility]. He was transported to ED for further evaluation.   Upon arrival to the ED he was persistently hypotensive despite 3L IVF resuscitation and started on norepinephrine. Laboratory evaluation significant for WBC 13.6, hemoglobin 8.0, creatinine 1.97, AST 91, lactic wnl, UA with moderate leukocytes and many bacteria. Nitrites negative. Due to need for pressors PCCM asked to see.   SUBJECTIVE:  Remains on trach collar during daytime and vent At bedtime  .  Doing well with passey muir. Feeling better today  PICC out 2/2 .  Remains on low dose pressors, weaning .  Awake and following commands. ST improved on Amiodarone    VITAL SIGNS: BP (!) 102/55   Pulse 86   Temp 98.3 F (36.8 C) (Oral)   Resp (!) 27   Ht 5'  10" (1.778 m)   Wt 176 lb 5.9 oz (80 kg)   SpO2 98%   BMI 25.31 kg/m   HEMODYNAMICS: CVP:  [12 mmHg] 12 mmHg  VENTILATOR SETTINGS: Vent Mode: PRVC FiO2 (%):  [28 %-40 %] 35 % Set Rate:  [15 bmp] 15 bmp Vt Set:  [500 mL] 500 mL PEEP:  [5 cmH20] 5 cmH20 Plateau Pressure:  [18 cmH20] 18 cmH20  INTAKE / OUTPUT: I/O last 3 completed shifts: In: 7203.2 [I.V.:4019.8; NG/GT:1980; IV Piggyback:1203.3] Out: 3225 [Urine:3225]  PHYSICAL EXAMINATION: General:  Chronically ill appearing male on trach collar  Neuro:  Awake, f/c , paraplegic  HEENT:  [place]/AT, PERRL ,  Cardiovascular:  Irreg/ no m/r/g  Lungs:  Coarse BS diffusely Abdomen:  Soft, non-tender, non-distended, BS + , Peg  Musculoskeletal:  RLE AKA remote Skin:   sacral ulcer   LABS:  BMET Recent Labs  Lab 10/06/17 2243 10/08/17 0617 10/09/17 0615  NA 136 139 139  K 4.3 3.3* 3.5  CL 99* 104 103  CO2 25 25 25   BUN 85* 67* 59*  CREATININE 1.97* 1.17 0.99  GLUCOSE 128* 190* 132*   Electrolytes Recent Labs  Lab 10/06/17 2243 10/08/17 0617 10/09/17 0615  CALCIUM 8.2* 8.1* 8.2*  MG  --  2.1  --   PHOS  --  1.8* 1.8*   CBC Recent Labs  Lab 10/06/17 2243 10/08/17 0617 10/09/17 0615  WBC 13.6* 15.0* 14.4*  HGB 8.0* 7.4* 7.4*  HCT 27.4* 25.3* 24.3*  PLT 242 225 184   Coag's Recent Labs  Lab  10/07/17 0400  INR 1.45   Sepsis Markers Recent Labs  Lab 10/06/17 2300 10/07/17 0130 10/07/17 0400 10/09/17 0615  LATICACIDVEN 1.80 1.32  --  1.2  PROCALCITON  --   --  45.77 21.68   ABG Recent Labs  Lab 10/06/17 2332 10/07/17 0414  PHART 7.331* 7.361  PCO2ART 29.7* 44.9  PO2ART 62.0* 176.0*   Liver Enzymes Recent Labs  Lab 10/06/17 2243 10/09/17 0615  AST 91* 137*  ALT 90* 160*  ALKPHOS 537* 736*  BILITOT 1.2 1.3*  ALBUMIN 2.0* 1.7*   Cardiac Enzymes No results for input(s): TROPONINI, PROBNP in the last 168 hours.  Glucose Recent Labs  Lab 10/08/17 1118 10/08/17 1548 10/08/17 1932  10/09/17 0018 10/09/17 0325 10/09/17 0729  GLUCAP 168* 128* 132* 133* 117* 146*   Imaging Dg Chest Port 1 View  Result Date: 10/09/2017 CLINICAL DATA:  Respiratory failure EXAM: PORTABLE CHEST 1 VIEW COMPARISON:  10/06/2017 FINDINGS: Tracheostomy. Right central venous catheter seen previously has been removed in the interval. Shallow inspiration. Cardiac enlargement with mild vascular congestion. Mild perihilar interstitial changes likely representing edema. Small bilateral pleural effusions. No significant change since previous study. No pneumothorax. IMPRESSION: Cardiac enlargement with mild pulmonary vascular congestion, bilateral interstitial edema, and small pleural effusions, similar to previous study. Electronically Signed   By: Burman Nieves M.D.   On: 10/09/2017 06:26   STUDIES:   CULTURES: Blood 2/1 >MRSA /E Coli  Urine 2/1 > Sputum 2/1 > C Diff 2/1 >neg    ANTIBIOTICS: Ciprofloxacin >>>1/22 Amikacin 1/30 >1/31 Levofloxacin 1/30 >1/31 Zerbaxa 2/1 >>>2/1 Linezolid 2/1 >>>2/1 Vanc 2/1 >> Avycaz 2/1>>  SIGNIFICANT EVENTS: 1/13 > 1/20 admit for PNA, UTI  LINES/TUBES: PICC RUE 1/18 >>> Trach 07/2016>>>  DISCUSSION: 67 year old male with paraplegia secondary to cerebellar atrophy. [facility] resident for chronic trach, nocturnal vent. Recently admitted for PNA, UTI with multiple organisms with multiple drug resistances. He was discharged 1/20 now is back with fevers and hypotension from what is presumed to be septic shock. He was started on pressors and broad spectrum antibiotics. Admitted to ICU.  ASSESSMENT / PLAN:  PULMONARY A: Chronic respiratory failure  COPD without acute exacerbation ? PNA Small bilateral pleural effusions  P:   Trach collar as able  Vent at bedtime  VAP bundle Scheduled xopenex, atrovent See ID Check CXR in am   CARDIOVASCULAR A:  Septic shock Atrial fibrillation with RVR-improved  Chronic systolic CHF (EF 81-19%)   P:  MAP  goal > Titrate Norepinephrine titrated to MAP goal Telemetry monitoring Heparin infusion in lieu of home warfarin Holding home metoprolol, diltiazem Amiodarone drip  RENAL A:   CKD IV reported although baseline creatinine 0.8 on recent admit AKI Hypokalemia  Hypophos +7 L I/O bal since admission   P:   S/p hydration Follow BMP Replace electrolytes as indicated BMET in AM Replace K PHOS  Decrease LR KVO   GASTROINTESTINAL A:   Tranasminitis  Diarrhea-C Diff neg   P:   TF for nutrition Protonix for SUP Tr LFT   HEMATOLOGIC A:   Anemia hemoglobin at about baseline  P:  Heparin infusion Follow CBC   INFECTIOUS A:   MRSA/E Coli?Proteus  Bacteremia  ID following  PCT tr down 45 to 21  PICC line out 2/2. Hope to have central line holiday if possible for few days .  P:   Cont IV abx  Appreciate ID recommendations to d/c central line and consider TEE next week.  Tr PCT   ENDOCRINE A:   Hyperglycemia    P:    SSI   NEUROLOGIC A:   Paraplegia secondary to cerebellar atrophy.   P:   RASS goal: 0 PRN fentanyl for analgesia, sedation if needed   FAMILY  - Updates: No family bedside 2/1  - Inter-disciplinary family meet or Palliative Care meeting due by:  2/6  Virl Coble NP-C  [facility]  [phone]   10/09/2017 11:26 AM

## 2017-10-09 NOTE — Progress Notes (Signed)
ANTICOAGULATION CONSULT NOTE  Pharmacy Consult for Heparin Indication: atrial fibrillation  No Known Allergies  Patient Measurements: Height: 5\' 10"  (177.8 cm) Weight: 176 lb 5.9 oz (80 kg) IBW/kg (Calculated) : 73  Vital Signs: Temp: 97.6 F (36.4 C) (02/03 1532) Temp Source: Oral (02/03 1532) BP: 99/64 (02/03 1700) Pulse Rate: 90 (02/03 1700)  Labs: Recent Labs    10/06/17 2243 10/07/17 0400  10/08/17 0617  10/08/17 2203 10/09/17 0615 10/09/17 1639  HGB 8.0*  --   --  7.4*  --   --  7.4*  --   HCT 27.4*  --   --  25.3*  --   --  24.3*  --   PLT 242  --   --  225  --   --  184  --   LABPROT  --  17.5*  --   --   --   --   --   --   INR  --  1.45  --   --   --   --   --   --   HEPARINUNFRC  --   --    < > <0.10*   < > 0.11* 0.20* 0.34  CREATININE 1.97*  --   --  1.17  --   --  0.99  --    < > = values in this interval not displayed.    Estimated Creatinine Clearance: 77.8 mL/min (by C-G formula based on SCr of 0.99 mg/dL).  Assessment: 67 y.o. male admitted with sepsis, h/o Afib, for heparin. Heparin level now therapeutic on  2200 units/hr. No signs of bleeding noted   Goal of Therapy:  Heparin level 0.3-0.7 units/ml Monitor platelets by anticoagulation protocol: Yes   Plan:  Continue heparin 2200 units/hr  Follow-up am labs.    Sharin MonsEmily Shy Guallpa, PharmD, BCPS PGY2 Infectious Diseases Pharmacy Resident Pager: [phone]  10/09/2017,6:22 PM

## 2017-10-09 NOTE — Progress Notes (Signed)
INFECTIOUS DISEASE PROGRESS NOTE  ID: Craig Oneal. is a 67 y.o. male with  Active Problems:   AKI (acute kidney injury) ([place])   Chronic respiratory failure (HCC)   Septic shock (HCC)   Chronic obstructive pulmonary disease (HCC)   History of Clostridium difficile infection   Pressure injury of skin   MRSA bacteremia   E coli bacteremia   Bacterial infection due to Proteus mirabilis   Infection of peripherally inserted central venous catheter (PICC)   History of MDR Pseudomonas aeruginosa infection  Subjective: No complaints.   Abtx:  Anti-infectives (From admission, onward)   Start     Dose/Rate Route Frequency Ordered Stop   10/08/17 1800  vancomycin (VANCOCIN) IVPB 1000 mg/200 mL premix     1,000 mg 200 mL/hr over 60 Minutes Intravenous Every 12 hours 10/08/17 0949     10/08/17 1400  ceftazidime-avibactam (AVYCAZ) 2.5 g in dextrose 5 % 50 mL IVPB     2.5 g 25 mL/hr over 2 Hours Intravenous Every 8 hours 10/08/17 0948     10/07/17 2200  linezolid (ZYVOX) IVPB 600 mg  Status:  Discontinued     600 mg 300 mL/hr over 60 Minutes Intravenous Every 12 hours 10/07/17 1609 10/07/17 1640   10/07/17 1800  ceftolozane-tazobactam (ZERBAXA) 750 mg in sodium chloride 0.9 % 100 mL IVPB  Status:  Discontinued     750 mg 105.7 mL/hr over 60 Minutes Intravenous Every 8 hours 10/07/17 1341 10/07/17 1706   10/07/17 1800  vancomycin (VANCOCIN) IVPB 750 mg/150 ml premix  Status:  Discontinued     750 mg 150 mL/hr over 60 Minutes Intravenous Every 12 hours 10/07/17 1640 10/08/17 0949   10/07/17 1800  ceftazidime-avibactam (AVYCAZ) 1.25 g in dextrose 5 % 50 mL IVPB  Status:  Discontinued     1.25 g 25 mL/hr over 2 Hours Intravenous Every 8 hours 10/07/17 1707 10/08/17 0948   10/07/17 1400  linezolid (ZYVOX) tablet 600 mg  Status:  Discontinued     600 mg Per Tube Every 12 hours 10/07/17 1341 10/07/17 1609   10/07/17 0515  vancomycin (VANCOCIN) 50 mg/mL oral solution 125 mg  Status:   Discontinued     125 mg Oral 4 times daily 10/07/17 0501 10/07/17 0844   10/07/17 0100  linezolid (ZYVOX) IVPB 600 mg     600 mg 300 mL/hr over 60 Minutes Intravenous  Once 10/07/17 0059 10/07/17 0220   10/07/17 0100  ceftolozane-tazobactam (ZERBAXA) 750 mg in sodium chloride 0.9 % 100 mL IVPB     750 mg 105.7 mL/hr over 60 Minutes Intravenous Every 8 hours 10/07/17 0059 10/07/17 1122      Medications:  Scheduled: . chlorhexidine gluconate (MEDLINE KIT)  15 mL Mouth Rinse BID  . feeding supplement (PRO-STAT SUGAR FREE 64)  30 mL Per Tube BID  . insulin aspart  0-9 Units Subcutaneous Q4H  . ipratropium  0.5 mg Nebulization Q6H  . levalbuterol  0.63 mg Nebulization Q6H  . mouth rinse  15 mL Mouth Rinse QID  . pantoprazole sodium  40 mg Per Tube QHS    Objective: Vital signs in last 24 hours: Temp:  [98.2 F (36.8 C)-98.6 F (37 C)] 98.3 F (36.8 C) (02/03 1143) Pulse Rate:  [50-135] 86 (02/03 1121) Resp:  [13-29] 27 (02/03 1121) BP: (77-132)/(44-93) 102/55 (02/03 1000) SpO2:  [89 %-100 %] 98 % (02/03 1121) FiO2 (%):  [28 %-40 %] 35 % (02/03 1121) Weight:  [80 kg (176  lb 5.9 oz)] 80 kg (176 lb 5.9 oz) (02/03 0500)   General appearance: alert and no distress Resp: rhonchi anterior - bilateral Cardio: regular rate and rhythm GI: abnormal findings:  distended and hypoactive bowel sounds Extremities: edema anasarca  Lab Results Recent Labs    10/08/17 0617 10/09/17 0615  WBC 15.0* 14.4*  HGB 7.4* 7.4*  HCT 25.3* 24.3*  NA 139 139  K 3.3* 3.5  CL 104 103  CO2 25 25  BUN 67* 59*  CREATININE 1.17 0.99   Liver Panel Recent Labs    10/06/17 2243 10/09/17 0615  PROT 7.4 6.9  ALBUMIN 2.0* 1.7*  AST 91* 137*  ALT 90* 160*  ALKPHOS 537* 736*  BILITOT 1.2 1.3*   Sedimentation Rate No results for input(s): ESRSEDRATE in the last 72 hours. C-Reactive Protein No results for input(s): CRP in the last 72 hours.  Microbiology: Recent Results (from the past 240  hour(s))  Urine culture     Status: Abnormal   Collection Time: 10/06/17 10:43 PM  Result Value Ref Range Status   Specimen Description URINE, RANDOM  Final   Special Requests   Final    NONE Performed at [facility], [address], [place], [place] [address]    Culture MULTIPLE SPECIES PRESENT, SUGGEST RECOLLECTION (A)  Final   Report Status 10/08/2017 FINAL  Final  Blood culture (routine x 2)     Status: Abnormal (Preliminary result)   Collection Time: 10/06/17 10:46 PM  Result Value Ref Range Status   Specimen Description BLOOD LEFT FOREARM  Final   Special Requests   Final    BOTTLES DRAWN AEROBIC AND ANAEROBIC Blood Culture adequate volume   Culture  Setup Time   Final    GRAM NEGATIVE RODS GRAM POSITIVE COCCI IN PAIRS IN CLUSTERS IN BOTH AEROBIC AND ANAEROBIC BOTTLES CRITICAL RESULT CALLED TO, READ BACK BY AND VERIFIED WITH: J MILLEN,PHARMD AT 9678 10/07/17 BY L BENFIELD    Culture (A)  Final    METHICILLIN RESISTANT STAPHYLOCOCCUS AUREUS ESCHERICHIA COLI Confirmed Extended Spectrum Beta-Lactamase Producer (ESBL).  In bloodstream infections from ESBL organisms, carbapenems are preferred over piperacillin/tazobactam. They are shown to have a lower risk of mortality. GRAM NEGATIVE RODS CULTURE REINCUBATED FOR BETTER GROWTH Performed at [facility], [place] [address] [place], [place] [address]    Report Status PENDING  Incomplete   Organism ID, Bacteria METHICILLIN RESISTANT STAPHYLOCOCCUS AUREUS  Final   Organism ID, Bacteria ESCHERICHIA COLI  Final      Susceptibility   Escherichia coli - MIC*    AMPICILLIN >=32 RESISTANT Resistant     CEFAZOLIN >=64 RESISTANT Resistant     CEFEPIME >=64 RESISTANT Resistant     CEFTAZIDIME RESISTANT Resistant     CEFTRIAXONE >=64 RESISTANT Resistant     CIPROFLOXACIN >=4 RESISTANT Resistant     GENTAMICIN <=1 SENSITIVE Sensitive     IMIPENEM <=0.25 SENSITIVE Sensitive     TRIMETH/SULFA >=320 RESISTANT Resistant      AMPICILLIN/SULBACTAM >=32 RESISTANT Resistant     PIP/TAZO <=4 SENSITIVE Sensitive     Extended ESBL POSITIVE Resistant     * ESCHERICHIA COLI   Methicillin resistant staphylococcus aureus - MIC*    CIPROFLOXACIN >=8 RESISTANT Resistant     ERYTHROMYCIN >=8 RESISTANT Resistant     GENTAMICIN <=0.5 SENSITIVE Sensitive     OXACILLIN >=4 RESISTANT Resistant     TETRACYCLINE <=1 SENSITIVE Sensitive     VANCOMYCIN 1 SENSITIVE Sensitive  TRIMETH/SULFA <=10 SENSITIVE Sensitive     CLINDAMYCIN <=0.25 SENSITIVE Sensitive     RIFAMPIN <=0.5 SENSITIVE Sensitive     Inducible Clindamycin NEGATIVE Sensitive     * METHICILLIN RESISTANT STAPHYLOCOCCUS AUREUS  Blood Culture ID Panel (Reflexed)     Status: Abnormal   Collection Time: 10/06/17 10:46 PM  Result Value Ref Range Status   Enterococcus species NOT DETECTED NOT DETECTED Final   Listeria monocytogenes NOT DETECTED NOT DETECTED Final   Staphylococcus species DETECTED (A) NOT DETECTED Final    Comment: CRITICAL RESULT CALLED TO, READ BACK BY AND VERIFIED WITH: J MILLEN,PHARMD AT 1534 10/08/17 BY L BENFIELD    Staphylococcus aureus DETECTED (A) NOT DETECTED Final    Comment: Methicillin (oxacillin)-resistant Staphylococcus aureus (MRSA). MRSA is predictably resistant to beta-lactam antibiotics (except ceftaroline). Preferred therapy is vancomycin unless clinically contraindicated. Patient requires contact precautions if  hospitalized. CRITICAL RESULT CALLED TO, READ BACK BY AND VERIFIED WITH: J MILLEN,PHARMD AT 1534 10/08/17 BY L BENFIELD    Methicillin resistance DETECTED (A) NOT DETECTED Final    Comment: CRITICAL RESULT CALLED TO, READ BACK BY AND VERIFIED WITH: J MILLEN,PHARMD AT 1534 10/08/17 BY L BENFIELD    Streptococcus species NOT DETECTED NOT DETECTED Final   Streptococcus agalactiae NOT DETECTED NOT DETECTED Final   Streptococcus pneumoniae NOT DETECTED NOT DETECTED Final   Streptococcus pyogenes NOT DETECTED NOT DETECTED Final    Acinetobacter baumannii NOT DETECTED NOT DETECTED Final   Enterobacteriaceae species DETECTED (A) NOT DETECTED Final    Comment: CRITICAL RESULT CALLED TO, READ BACK BY AND VERIFIED WITH: J MILLEN,PHARMD AT 1534 10/08/17 BY L BENFIELD    Enterobacter cloacae complex NOT DETECTED NOT DETECTED Final   Escherichia coli DETECTED (A) NOT DETECTED Final    Comment: CRITICAL RESULT CALLED TO, READ BACK BY AND VERIFIED WITH: J MILLEN,PHARMD AT 1534 10/08/17 BY L BENFIELD    Klebsiella oxytoca NOT DETECTED NOT DETECTED Final   Klebsiella pneumoniae NOT DETECTED NOT DETECTED Final   Proteus species DETECTED (A) NOT DETECTED Final    Comment: CRITICAL RESULT CALLED TO, READ BACK BY AND VERIFIED WITH: J MILLEN,PHARMD AT 1534 10/08/17 BY L BENFIELD    Serratia marcescens NOT DETECTED NOT DETECTED Final   Carbapenem resistance NOT DETECTED NOT DETECTED Final   Haemophilus influenzae NOT DETECTED NOT DETECTED Final   Neisseria meningitidis NOT DETECTED NOT DETECTED Final   Pseudomonas aeruginosa NOT DETECTED NOT DETECTED Final   Candida albicans NOT DETECTED NOT DETECTED Final   Candida glabrata NOT DETECTED NOT DETECTED Final   Candida krusei NOT DETECTED NOT DETECTED Final   Candida parapsilosis NOT DETECTED NOT DETECTED Final   Candida tropicalis NOT DETECTED NOT DETECTED Final    Comment: Performed at [facility], [address], [place], [place] [address]  Blood culture (routine x 2)     Status: Abnormal   Collection Time: 10/06/17 11:00 PM  Result Value Ref Range Status   Specimen Description BLOOD RIGHT ARM  Final   Special Requests   Final    BOTTLES DRAWN AEROBIC AND ANAEROBIC Blood Culture adequate volume   Culture  Setup Time   Final    GRAM POSITIVE COCCI IN CLUSTERS IN BOTH AEROBIC AND ANAEROBIC BOTTLES CRITICAL VALUE NOTED.  VALUE IS CONSISTENT WITH PREVIOUSLY REPORTED AND CALLED VALUE.    Culture (A)  Final    STAPHYLOCOCCUS AUREUS SUSCEPTIBILITIES PERFORMED ON PREVIOUS  CULTURE WITHIN THE LAST 5 DAYS. Performed at [facility], [address] [place], [place] [address]    Report Status 10/09/2017 FINAL  Final  C difficile quick scan w PCR reflex     Status: None   Collection Time: 10/07/17  4:20 AM  Result Value Ref Range Status   C Diff antigen NEGATIVE NEGATIVE Final   C Diff toxin NEGATIVE NEGATIVE Final   C Diff interpretation No C. difficile detected.  Final    Studies/Results: Dg Chest Port 1 View  Result Date: 10/09/2017 CLINICAL DATA:  Respiratory failure EXAM: PORTABLE CHEST 1 VIEW COMPARISON:  10/06/2017 FINDINGS: Tracheostomy. Right central venous catheter seen previously has been removed in the interval. Shallow inspiration. Cardiac enlargement with mild vascular congestion. Mild perihilar interstitial changes likely representing edema. Small bilateral pleural effusions. No significant change since previous study. No pneumothorax. IMPRESSION: Cardiac enlargement with mild pulmonary vascular congestion, bilateral interstitial edema, and small pleural effusions, similar to previous study. Electronically Signed   By: Lucienne Capers M.D.   On: 10/09/2017 06:26     Assessment/Plan: MRSA bacteremia 2/2 Polymicrobial bacteremia (ESBL E coli, Proteus) 1/2 Paraplegia Tracheostomy CKD with AKI Cardiomegally with CHF Sacral wound Anemia Prev candidemia, previous MDR pseudomonas pna (09-19-17)   Total days of antibiotics: 2 vanco/zerbaxa  Appreciate the outstanding help of pharmacy, adjusting meds with change in CrCl.  Will change to merrem.  TEE pending Kidney function improved however LFTs are worsening.  Due to CHF? Pressors? Consider u/s if persists? CXR unchanged Will repeat BCx Pressors off PIC pulled 2-2         Bobby Rumpf MD, FACP Infectious Diseases (pager) [phone] [email or web address] 10/09/2017, 12:11 PM  LOS: 2 days

## 2017-10-10 ENCOUNTER — Inpatient Hospital Stay (HOSPITAL_COMMUNITY): Payer: Medicare Other

## 2017-10-10 DIAGNOSIS — A4151 Sepsis due to Escherichia coli [E. coli]: Secondary | ICD-10-CM

## 2017-10-10 DIAGNOSIS — G8220 Paraplegia, unspecified: Secondary | ICD-10-CM | POA: Diagnosis not present

## 2017-10-10 DIAGNOSIS — J449 Chronic obstructive pulmonary disease, unspecified: Secondary | ICD-10-CM | POA: Diagnosis not present

## 2017-10-10 DIAGNOSIS — A4102 Sepsis due to Methicillin resistant Staphylococcus aureus: Secondary | ICD-10-CM | POA: Diagnosis not present

## 2017-10-10 DIAGNOSIS — N179 Acute kidney failure, unspecified: Secondary | ICD-10-CM | POA: Diagnosis not present

## 2017-10-10 DIAGNOSIS — J9610 Chronic respiratory failure, unspecified whether with hypoxia or hypercapnia: Secondary | ICD-10-CM | POA: Diagnosis not present

## 2017-10-10 DIAGNOSIS — Z931 Gastrostomy status: Secondary | ICD-10-CM | POA: Diagnosis not present

## 2017-10-10 DIAGNOSIS — I5020 Unspecified systolic (congestive) heart failure: Secondary | ICD-10-CM | POA: Diagnosis not present

## 2017-10-10 DIAGNOSIS — I480 Paroxysmal atrial fibrillation: Secondary | ICD-10-CM | POA: Diagnosis not present

## 2017-10-10 DIAGNOSIS — Z930 Tracheostomy status: Secondary | ICD-10-CM | POA: Diagnosis not present

## 2017-10-10 DIAGNOSIS — Z1612 Extended spectrum beta lactamase (ESBL) resistance: Secondary | ICD-10-CM | POA: Diagnosis not present

## 2017-10-10 DIAGNOSIS — Z8619 Personal history of other infectious and parasitic diseases: Secondary | ICD-10-CM | POA: Diagnosis not present

## 2017-10-10 DIAGNOSIS — R6521 Severe sepsis with septic shock: Secondary | ICD-10-CM | POA: Diagnosis not present

## 2017-10-10 DIAGNOSIS — Z87440 Personal history of urinary (tract) infections: Secondary | ICD-10-CM | POA: Diagnosis not present

## 2017-10-10 DIAGNOSIS — Z89611 Acquired absence of right leg above knee: Secondary | ICD-10-CM | POA: Diagnosis not present

## 2017-10-10 DIAGNOSIS — A499 Bacterial infection, unspecified: Secondary | ICD-10-CM

## 2017-10-10 DIAGNOSIS — B9562 Methicillin resistant Staphylococcus aureus infection as the cause of diseases classified elsewhere: Secondary | ICD-10-CM | POA: Diagnosis not present

## 2017-10-10 DIAGNOSIS — R7881 Bacteremia: Secondary | ICD-10-CM | POA: Diagnosis not present

## 2017-10-10 LAB — GLUCOSE, CAPILLARY
GLUCOSE-CAPILLARY: 104 mg/dL — AB (ref 65–99)
GLUCOSE-CAPILLARY: 106 mg/dL — AB (ref 65–99)
GLUCOSE-CAPILLARY: 95 mg/dL (ref 65–99)
Glucose-Capillary: 125 mg/dL — ABNORMAL HIGH (ref 65–99)
Glucose-Capillary: 85 mg/dL (ref 65–99)
Glucose-Capillary: 80 mg/dL (ref 65–99)

## 2017-10-10 LAB — HEPATIC FUNCTION PANEL
ALK PHOS: 744 U/L — AB (ref 38–126)
ALT: 144 U/L — AB (ref 17–63)
AST: 92 U/L — ABNORMAL HIGH (ref 15–41)
Albumin: 1.7 g/dL — ABNORMAL LOW (ref 3.5–5.0)
BILIRUBIN INDIRECT: 0.7 mg/dL (ref 0.3–0.9)
BILIRUBIN TOTAL: 1.1 mg/dL (ref 0.3–1.2)
Bilirubin, Direct: 0.4 mg/dL (ref 0.1–0.5)
TOTAL PROTEIN: 6.8 g/dL (ref 6.5–8.1)

## 2017-10-10 LAB — BASIC METABOLIC PANEL
ANION GAP: 13 (ref 5–15)
BUN: 53 mg/dL — ABNORMAL HIGH (ref 6–20)
CHLORIDE: 105 mmol/L (ref 101–111)
CO2: 25 mmol/L (ref 22–32)
Calcium: 8.1 mg/dL — ABNORMAL LOW (ref 8.9–10.3)
Creatinine, Ser: 0.86 mg/dL (ref 0.61–1.24)
Glucose, Bld: 103 mg/dL — ABNORMAL HIGH (ref 65–99)
POTASSIUM: 3.9 mmol/L (ref 3.5–5.1)
SODIUM: 143 mmol/L (ref 135–145)

## 2017-10-10 LAB — CBC
HEMATOCRIT: 22.7 % — AB (ref 39.0–52.0)
HEMOGLOBIN: 6.7 g/dL — AB (ref 13.0–17.0)
MCH: 23.5 pg — ABNORMAL LOW (ref 26.0–34.0)
MCHC: 29.5 g/dL — ABNORMAL LOW (ref 30.0–36.0)
MCV: 79.6 fL (ref 78.0–100.0)
Platelets: 159 10*3/uL (ref 150–400)
RBC: 2.85 MIL/uL — AB (ref 4.22–5.81)
RDW: 18.2 % — ABNORMAL HIGH (ref 11.5–15.5)
WBC: 13.9 10*3/uL — ABNORMAL HIGH (ref 4.0–10.5)

## 2017-10-10 LAB — PREPARE RBC (CROSSMATCH)

## 2017-10-10 LAB — HEPARIN LEVEL (UNFRACTIONATED): Heparin Unfractionated: 0.27 IU/mL — ABNORMAL LOW (ref 0.30–0.70)

## 2017-10-10 MED ORDER — SENNOSIDES 8.8 MG/5ML PO SYRP
5.0000 mL | ORAL_SOLUTION | Freq: Every day | ORAL | Status: DC | PRN
  Filled 2017-10-10: qty 5

## 2017-10-10 MED ORDER — SODIUM CHLORIDE 0.9 % IV SOLN
INTRAVENOUS | Status: DC
  Administered 2017-10-10: 1000 mL via INTRAVENOUS

## 2017-10-10 MED ORDER — SODIUM CHLORIDE 0.9 % IV SOLN: Freq: Once | INTRAVENOUS | Status: DC

## 2017-10-10 MED ORDER — SODIUM CHLORIDE 0.9 % IV SOLN: INTRAVENOUS | Status: DC

## 2017-10-10 MED ORDER — METOPROLOL TARTRATE 25 MG/10 ML ORAL SUSPENSION
12.5000 mg | Freq: Two times a day (BID) | ORAL | Status: DC
  Administered 2017-10-10 – 2017-10-14 (×7): 12.5 mg
  Filled 2017-10-10 (×11): qty 5

## 2017-10-10 MED ORDER — BISACODYL 10 MG RE SUPP: 10.0000 mg | Freq: Every day | RECTAL | Status: DC | PRN

## 2017-10-10 NOTE — Progress Notes (Signed)
Subjective: Endorses pain in his bottom, denies shortness of breath, abd pain, back pain.  Antibiotics:  Anti-infectives (From admission, onward)   Start     Dose/Rate Route Frequency Ordered Stop   10/10/17 0600  vancomycin (VANCOCIN) IVPB 750 mg/150 ml premix     750 mg 150 mL/hr over 60 Minutes Intravenous Every 12 hours 10/09/17 1807     10/09/17 1400  meropenem (MERREM) 1 g in sodium chloride 0.9 % 100 mL IVPB     1 g 200 mL/hr over 30 Minutes Intravenous Every 8 hours 10/09/17 1221     10/08/17 1800  vancomycin (VANCOCIN) IVPB 1000 mg/200 mL premix  Status:  Discontinued     1,000 mg 200 mL/hr over 60 Minutes Intravenous Every 12 hours 10/08/17 0949 10/09/17 1807   10/08/17 1400  ceftazidime-avibactam (AVYCAZ) 2.5 g in dextrose 5 % 50 mL IVPB  Status:  Discontinued     2.5 g 25 mL/hr over 2 Hours Intravenous Every 8 hours 10/08/17 0948 10/09/17 1217   10/07/17 2200  linezolid (ZYVOX) IVPB 600 mg  Status:  Discontinued     600 mg 300 mL/hr over 60 Minutes Intravenous Every 12 hours 10/07/17 1609 10/07/17 1640   10/07/17 1800  ceftolozane-tazobactam (ZERBAXA) 750 mg in sodium chloride 0.9 % 100 mL IVPB  Status:  Discontinued     750 mg 105.7 mL/hr over 60 Minutes Intravenous Every 8 hours 10/07/17 1341 10/07/17 1706   10/07/17 1800  vancomycin (VANCOCIN) IVPB 750 mg/150 ml premix  Status:  Discontinued     750 mg 150 mL/hr over 60 Minutes Intravenous Every 12 hours 10/07/17 1640 10/08/17 0949   10/07/17 1800  ceftazidime-avibactam (AVYCAZ) 1.25 g in dextrose 5 % 50 mL IVPB  Status:  Discontinued     1.25 g 25 mL/hr over 2 Hours Intravenous Every 8 hours 10/07/17 1707 10/08/17 0948   10/07/17 1400  linezolid (ZYVOX) tablet 600 mg  Status:  Discontinued     600 mg Per Tube Every 12 hours 10/07/17 1341 10/07/17 1609   10/07/17 0515  vancomycin (VANCOCIN) 50 mg/mL oral solution 125 mg  Status:  Discontinued     125 mg Oral 4 times daily 10/07/17 0501 10/07/17 0844   10/07/17 0100  linezolid (ZYVOX) IVPB 600 mg     600 mg 300 mL/hr over 60 Minutes Intravenous  Once 10/07/17 0059 10/07/17 0220   10/07/17 0100  ceftolozane-tazobactam (ZERBAXA) 750 mg in sodium chloride 0.9 % 100 mL IVPB     750 mg 105.7 mL/hr over 60 Minutes Intravenous Every 8 hours 10/07/17 0059 10/07/17 1122      Medications: Scheduled Meds: . chlorhexidine gluconate (MEDLINE KIT)  15 mL Mouth Rinse BID  . feeding supplement (PRO-STAT SUGAR FREE 64)  30 mL Per Tube BID  . insulin aspart  0-9 Units Subcutaneous Q4H  . ipratropium  0.5 mg Nebulization Q6H  . levalbuterol  0.63 mg Nebulization Q6H  . mouth rinse  15 mL Mouth Rinse QID  . pantoprazole sodium  40 mg Per Tube QHS   Continuous Infusions: . sodium chloride 250 mL (10/09/17 1155)  . sodium chloride    . amiodarone 30 mg/hr (10/09/17 2015)  . feeding supplement (VITAL AF 1.2 CAL) 1,000 mL (10/10/17 0200)  . heparin 2,200 Units/hr (10/10/17 0200)  . lactated ringers Stopped (10/09/17 1442)  . meropenem (MERREM) IV 1 g (10/10/17 5400)  . norepinephrine (LEVOPHED) Adult infusion Stopped (10/09/17 1153)  . vancomycin  PRN Meds:.sodium chloride, fentaNYL (SUBLIMAZE) injection, fentaNYL (SUBLIMAZE) injection  Objective: Weight change: 7.1 oz (0.2 kg)  Intake/Output Summary (Last 24 hours) at 10/10/2017 0841 Last data filed at 10/10/2017 0650 Gross per 24 hour  Intake 2027.89 ml  Output 1520 ml  Net 507.89 ml   Blood pressure (!) 101/51, pulse 93, temperature 99 F (37.2 C), temperature source Oral, resp. rate (!) 25, height 5' 10"  (1.778 m), weight 176 lb 12.9 oz (80.2 kg), SpO2 95 %. Temp:  [97.6 F (36.4 C)-99 F (37.2 C)] 99 F (37.2 C) (02/04 0727) Pulse Rate:  [51-106] 93 (02/04 0800) Resp:  [16-32] 25 (02/04 0800) BP: (90-121)/(49-72) 101/51 (02/04 0800) SpO2:  [90 %-100 %] 95 % (02/04 0800) FiO2 (%):  [35 %-40 %] 40 % (02/04 0800) Weight:  [176 lb 12.9 oz (80.2 kg)] 176 lb 12.9 oz (80.2 kg) (02/04  0500)  Physical Exam: General: Alert and awake, oriented x3, not in any acute distress. HEENT: anicteric sclera CVS: irregularly irregular, no M/R/G, radial pulses intact Chest: no increased work of breathing, coarse breath sounds throughout Abdomen: soft, distended, nontender, decreased bowel sounds Extremities: s/p R AKA, bil UE with edema Skin: no rashes Neuro: paraplegic, able to move bil LEs with 2/5 stregth  CBC: CBC Latest Ref Rng & Units 10/10/2017 10/09/2017 10/08/2017  WBC 4.0 - 10.5 K/uL 13.9(H) 14.4(H) 15.0(H)  Hemoglobin 13.0 - 17.0 g/dL 6.7(LL) 7.4(L) 7.4(L)  Hematocrit 39.0 - 52.0 % 22.7(L) 24.3(L) 25.3(L)  Platelets 150 - 400 K/uL 159 184 225    BMET Recent Labs    10/09/17 0615 10/10/17 0324  NA 139 143  K 3.5 3.9  CL 103 105  CO2 25 25  GLUCOSE 132* 103*  BUN 59* 53*  CREATININE 0.99 0.86  CALCIUM 8.2* 8.1*   Liver Panel  Recent Labs    10/09/17 0615  PROT 6.9  ALBUMIN 1.7*  AST 137*  ALT 160*  ALKPHOS 736*  BILITOT 1.3*   Sedimentation Rate No results for input(s): ESRSEDRATE in the last 72 hours. C-Reactive Protein No results for input(s): CRP in the last 72 hours.  Micro Results: Recent Results (from the past 720 hour(s))  Urine Culture     Status: None   Collection Time: 09/18/17  3:37 AM  Result Value Ref Range Status   Specimen Description URINE, RANDOM  Final   Special Requests NONE  Final   Culture NO GROWTH  Final   Report Status 09/19/2017 FINAL  Final  Blood culture (routine x 2)     Status: None   Collection Time: 09/18/17  3:58 AM  Result Value Ref Range Status   Specimen Description BLOOD RIGHT HAND  Final   Special Requests   Final    BOTTLES DRAWN AEROBIC AND ANAEROBIC Blood Culture adequate volume   Culture NO GROWTH 5 DAYS  Final   Report Status 09/23/2017 FINAL  Final  Blood culture (routine x 2)     Status: None   Collection Time: 09/18/17  4:03 AM  Result Value Ref Range Status   Specimen Description BLOOD LEFT  HAND  Final   Special Requests   Final    BOTTLES DRAWN AEROBIC AND ANAEROBIC Blood Culture adequate volume   Culture NO GROWTH 5 DAYS  Final   Report Status 09/23/2017 FINAL  Final  MRSA PCR Screening     Status: None   Collection Time: 09/18/17 12:20 PM  Result Value Ref Range Status   MRSA by PCR NEGATIVE NEGATIVE Final  Comment:        The GeneXpert MRSA Assay (FDA approved for NASAL specimens only), is one component of a comprehensive MRSA colonization surveillance program. It is not intended to diagnose MRSA infection nor to guide or monitor treatment for MRSA infections.   Culture, respiratory (NON-Expectorated)     Status: None   Collection Time: 09/19/17  2:11 PM  Result Value Ref Range Status   Specimen Description TRACHEAL ASPIRATE  Final   Special Requests NONE  Final   Gram Stain   Final    ABUNDANT WBC PRESENT,BOTH PMN AND MONONUCLEAR ABUNDANT GRAM NEGATIVE RODS MODERATE SQUAMOUS EPITHELIAL CELLS PRESENT    Culture MODERATE PSEUDOMONAS AERUGINOSA  Final   Report Status 09/22/2017 FINAL  Final   Organism ID, Bacteria PSEUDOMONAS AERUGINOSA  Final      Susceptibility   Pseudomonas aeruginosa - MIC*    CEFTAZIDIME 16 INTERMEDIATE Intermediate     CIPROFLOXACIN 1 SENSITIVE Sensitive     GENTAMICIN <=1 SENSITIVE Sensitive     IMIPENEM >=16 RESISTANT Resistant     CEFEPIME 16 INTERMEDIATE Intermediate     * MODERATE PSEUDOMONAS AERUGINOSA  Urine culture     Status: Abnormal   Collection Time: 10/06/17 10:43 PM  Result Value Ref Range Status   Specimen Description URINE, RANDOM  Final   Special Requests   Final    NONE Performed at [facility], [place] [address], [place], [place] [address]    Culture MULTIPLE SPECIES PRESENT, SUGGEST RECOLLECTION (A)  Final   Report Status 10/08/2017 FINAL  Final  Blood culture (routine x 2)     Status: Abnormal (Preliminary result)   Collection Time: 10/06/17 10:46 PM  Result Value Ref Range Status   Specimen  Description BLOOD LEFT FOREARM  Final   Special Requests   Final    BOTTLES DRAWN AEROBIC AND ANAEROBIC Blood Culture adequate volume   Culture  Setup Time   Final    GRAM NEGATIVE RODS GRAM POSITIVE COCCI IN PAIRS IN CLUSTERS IN BOTH AEROBIC AND ANAEROBIC BOTTLES CRITICAL RESULT CALLED TO, READ BACK BY AND VERIFIED WITH: J MILLEN,PHARMD AT 2440 10/07/17 BY L BENFIELD    Culture (A)  Final    METHICILLIN RESISTANT STAPHYLOCOCCUS AUREUS ESCHERICHIA COLI Confirmed Extended Spectrum Beta-Lactamase Producer (ESBL).  In bloodstream infections from ESBL organisms, carbapenems are preferred over piperacillin/tazobactam. They are shown to have a lower risk of mortality. PROTEUS MIRABILIS SUSCEPTIBILITIES TO FOLLOW Performed at [facility], [place] [address], [place],  [address]    Report Status PENDING  Incomplete   Organism ID, Bacteria METHICILLIN RESISTANT STAPHYLOCOCCUS AUREUS  Final   Organism ID, Bacteria ESCHERICHIA COLI  Final      Susceptibility   Escherichia coli - MIC*    AMPICILLIN >=32 RESISTANT Resistant     CEFAZOLIN >=64 RESISTANT Resistant     CEFEPIME >=64 RESISTANT Resistant     CEFTAZIDIME RESISTANT Resistant     CEFTRIAXONE >=64 RESISTANT Resistant     CIPROFLOXACIN >=4 RESISTANT Resistant     GENTAMICIN <=1 SENSITIVE Sensitive     IMIPENEM <=0.25 SENSITIVE Sensitive     TRIMETH/SULFA >=320 RESISTANT Resistant     AMPICILLIN/SULBACTAM >=32 RESISTANT Resistant     PIP/TAZO <=4 SENSITIVE Sensitive     Extended ESBL POSITIVE Resistant     * ESCHERICHIA COLI   Methicillin resistant staphylococcus aureus - MIC*    CIPROFLOXACIN >=8 RESISTANT Resistant     ERYTHROMYCIN >=8 RESISTANT Resistant  GENTAMICIN <=0.5 SENSITIVE Sensitive     OXACILLIN >=4 RESISTANT Resistant     TETRACYCLINE <=1 SENSITIVE Sensitive     VANCOMYCIN 1 SENSITIVE Sensitive     TRIMETH/SULFA <=10 SENSITIVE Sensitive     CLINDAMYCIN <=0.25 SENSITIVE Sensitive     RIFAMPIN <=0.5  SENSITIVE Sensitive     Inducible Clindamycin NEGATIVE Sensitive     * METHICILLIN RESISTANT STAPHYLOCOCCUS AUREUS  Blood Culture ID Panel (Reflexed)     Status: Abnormal   Collection Time: 10/06/17 10:46 PM  Result Value Ref Range Status   Enterococcus species NOT DETECTED NOT DETECTED Final   Listeria monocytogenes NOT DETECTED NOT DETECTED Final   Staphylococcus species DETECTED (A) NOT DETECTED Final    Comment: CRITICAL RESULT CALLED TO, READ BACK BY AND VERIFIED WITH: J MILLEN,PHARMD AT 1534 10/08/17 BY L BENFIELD    Staphylococcus aureus DETECTED (A) NOT DETECTED Final    Comment: Methicillin (oxacillin)-resistant Staphylococcus aureus (MRSA). MRSA is predictably resistant to beta-lactam antibiotics (except ceftaroline). Preferred therapy is vancomycin unless clinically contraindicated. Patient requires contact precautions if  hospitalized. CRITICAL RESULT CALLED TO, READ BACK BY AND VERIFIED WITH: J MILLEN,PHARMD AT 1534 10/08/17 BY L BENFIELD    Methicillin resistance DETECTED (A) NOT DETECTED Final    Comment: CRITICAL RESULT CALLED TO, READ BACK BY AND VERIFIED WITH: J MILLEN,PHARMD AT 1534 10/08/17 BY L BENFIELD    Streptococcus species NOT DETECTED NOT DETECTED Final   Streptococcus agalactiae NOT DETECTED NOT DETECTED Final   Streptococcus pneumoniae NOT DETECTED NOT DETECTED Final   Streptococcus pyogenes NOT DETECTED NOT DETECTED Final   Acinetobacter baumannii NOT DETECTED NOT DETECTED Final   Enterobacteriaceae species DETECTED (A) NOT DETECTED Final    Comment: CRITICAL RESULT CALLED TO, READ BACK BY AND VERIFIED WITH: J MILLEN,PHARMD AT 1534 10/08/17 BY L BENFIELD    Enterobacter cloacae complex NOT DETECTED NOT DETECTED Final   Escherichia coli DETECTED (A) NOT DETECTED Final    Comment: CRITICAL RESULT CALLED TO, READ BACK BY AND VERIFIED WITH: J MILLEN,PHARMD AT 1534 10/08/17 BY L BENFIELD    Klebsiella oxytoca NOT DETECTED NOT DETECTED Final   Klebsiella pneumoniae  NOT DETECTED NOT DETECTED Final   Proteus species DETECTED (A) NOT DETECTED Final    Comment: CRITICAL RESULT CALLED TO, READ BACK BY AND VERIFIED WITH: J MILLEN,PHARMD AT 1534 10/08/17 BY L BENFIELD    Serratia marcescens NOT DETECTED NOT DETECTED Final   Carbapenem resistance NOT DETECTED NOT DETECTED Final   Haemophilus influenzae NOT DETECTED NOT DETECTED Final   Neisseria meningitidis NOT DETECTED NOT DETECTED Final   Pseudomonas aeruginosa NOT DETECTED NOT DETECTED Final   Candida albicans NOT DETECTED NOT DETECTED Final   Candida glabrata NOT DETECTED NOT DETECTED Final   Candida krusei NOT DETECTED NOT DETECTED Final   Candida parapsilosis NOT DETECTED NOT DETECTED Final   Candida tropicalis NOT DETECTED NOT DETECTED Final    Comment: Performed at [facility], [address], [place], [place] [address]  Blood culture (routine x 2)     Status: Abnormal   Collection Time: 10/06/17 11:00 PM  Result Value Ref Range Status   Specimen Description BLOOD RIGHT ARM  Final   Special Requests   Final    BOTTLES DRAWN AEROBIC AND ANAEROBIC Blood Culture adequate volume   Culture  Setup Time   Final    GRAM POSITIVE COCCI IN CLUSTERS IN BOTH AEROBIC AND ANAEROBIC BOTTLES CRITICAL VALUE NOTED.  VALUE IS CONSISTENT WITH PREVIOUSLY REPORTED  AND CALLED VALUE.    Culture (A)  Final    STAPHYLOCOCCUS AUREUS SUSCEPTIBILITIES PERFORMED ON PREVIOUS CULTURE WITHIN THE LAST 5 DAYS. Performed at [facility], [place] [address], [place], [place] [address]    Report Status 10/09/2017 FINAL  Final  C difficile quick scan w PCR reflex     Status: None   Collection Time: 10/07/17  4:20 AM  Result Value Ref Range Status   C Diff antigen NEGATIVE NEGATIVE Final   C Diff toxin NEGATIVE NEGATIVE Final   C Diff interpretation No C. difficile detected.  Final  Culture, blood (Routine X 2) w Reflex to ID Panel     Status: None (Preliminary result)   Collection Time: 10/08/17 11:06 AM  Result  Value Ref Range Status   Specimen Description BLOOD LEFT ANTECUBITAL  Final   Special Requests   Final    BOTTLES DRAWN AEROBIC AND ANAEROBIC Blood Culture adequate volume   Culture   Final    NO GROWTH 1 DAY Performed at [facility], [place] [address] [place], [place] [address]    Report Status PENDING  Incomplete    Studies/Results: Dg Chest Port 1 View  Result Date: 10/09/2017 CLINICAL DATA:  Respiratory failure EXAM: PORTABLE CHEST 1 VIEW COMPARISON:  10/06/2017 FINDINGS: Tracheostomy. Right central venous catheter seen previously has been removed in the interval. Shallow inspiration. Cardiac enlargement with mild vascular congestion. Mild perihilar interstitial changes likely representing edema. Small bilateral pleural effusions. No significant change since previous study. No pneumothorax. IMPRESSION: Cardiac enlargement with mild pulmonary vascular congestion, bilateral interstitial edema, and small pleural effusions, similar to previous study. Electronically Signed   By: Lucienne Capers M.D.   On: 10/09/2017 06:26    Assessment/Plan:  INTERVAL HISTORY: PICC was d/c'd 02/02; still has RUE peripheral line from 1/31; LUE peripheral line 2/2 - still needs continuous amiodarone and heparin infusions currently. Repeat blood cultures on 02/02 NGTD x1d. His Hgb has been slowly downtrending since admission to 6.9 this morning w/o s/s of bleeding; primary is obtaining KUB to eval for evidence of abd/retroperitoneal bleeding.  Active Problems:   AKI (acute kidney injury) ([place])   Chronic respiratory failure (HCC)   Septic shock (HCC)   Chronic obstructive pulmonary disease (HCC)   History of Clostridium difficile infection   Pressure injury of skin   MRSA bacteremia   E coli bacteremia   Bacterial infection due to Proteus mirabilis   Infection of peripherally inserted central venous catheter (PICC)   History of MDR Pseudomonas aeruginosa infection   Craig Oneal. is a 67 y.o.  male with paraplegia, COPD, PAF, HFrEF, chronic respiratory failure s/p trach and PEG with multiple recent admissions for UTIs, MDR pseudomonas and Candidemia who presented to [facility] with septic shock, found to have MRSA and ESBL bacteremia.   Plan: --continue vanc and meropenem - appreciate pharmacy assistance with dosing --needs TEE to evaluate for vegetations --f/u repeat blood cultures from 2/2   LOS: 3 days   Nysir Fergusson 10/10/2017, 8:41 AM

## 2017-10-10 NOTE — Progress Notes (Signed)
PULMONARY / CRITICAL CARE MEDICINE   Name: Craig Oneal. MRN: [id number] DOB: 05-Aug-1951    ADMISSION DATE:  10/06/2017 CONSULTATION DATE:  2/1   REFERRING MD:  Allyne GeeSanders in ED  CHIEF COMPLAINT:  fever  HISTORY OF PRESENT ILLNESS:   67 yo male chronically vent dependent at night with cerebellar atrophy syndrome admitted on 2/1 with septic shock due to polymicrobial bacteremia.     SUBJECTIVE:  Transfused 1 U PRBC overnight PICC out over weekend  VITAL SIGNS: BP 109/71   Pulse (!) 104   Temp 99 F (37.2 C) (Oral)   Resp (!) 30   Ht 5\' 10"  (1.778 m)   Wt 176 lb 12.9 oz (80.2 kg)   SpO2 98%   BMI 25.37 kg/m   HEMODYNAMICS:    VENTILATOR SETTINGS: Vent Mode: PRVC FiO2 (%):  [35 %-40 %] 40 % Set Rate:  [15 bmp] 15 bmp Vt Set:  [500 mL] 500 mL PEEP:  [5 cmH20] 5 cmH20 Plateau Pressure:  [17 cmH20-22 cmH20] 22 cmH20  INTAKE / OUTPUT: I/O last 3 completed shifts: In: 4491.9 [I.V.:1866.9; NG/GT:1925; IV Piggyback:700] Out: 2345 [Urine:2345]  PHYSICAL EXAMINATION:  General:  In bed on trach collar HENT: NCAT trach in place PULM: CTA B, vent supported breathing CV: RRR, no mgr GI: BS+, soft, nontender MSK: normal bulk and tone Neuro: awake, nods head to question    LABS:  BMET Recent Labs  Lab 10/08/17 0617 10/09/17 0615 10/10/17 0324  NA 139 139 143  K 3.3* 3.5 3.9  CL 104 103 105  CO2 25 25 25   BUN 67* 59* 53*  CREATININE 1.17 0.99 0.86  GLUCOSE 190* 132* 103*    Electrolytes Recent Labs  Lab 10/08/17 0617 10/09/17 0615 10/10/17 0324  CALCIUM 8.1* 8.2* 8.1*  MG 2.1  --   --   PHOS 1.8* 1.8*  --     CBC Recent Labs  Lab 10/08/17 0617 10/09/17 0615 10/10/17 0324  WBC 15.0* 14.4* 13.9*  HGB 7.4* 7.4* 6.7*  HCT 25.3* 24.3* 22.7*  PLT 225 184 159    Coag's Recent Labs  Lab 10/07/17 0400  INR 1.45    Sepsis Markers Recent Labs  Lab 10/06/17 2300 10/07/17 0130 10/07/17 0400 10/09/17 0615  LATICACIDVEN 1.80 1.32  --   1.2  PROCALCITON  --   --  45.77 21.68    ABG Recent Labs  Lab 10/06/17 2332 10/07/17 0414  PHART 7.331* 7.361  PCO2ART 29.7* 44.9  PO2ART 62.0* 176.0*    Liver Enzymes Recent Labs  Lab 10/06/17 2243 10/09/17 0615  AST 91* 137*  ALT 90* 160*  ALKPHOS 537* 736*  BILITOT 1.2 1.3*  ALBUMIN 2.0* 1.7*    Cardiac Enzymes No results for input(s): TROPONINI, PROBNP in the last 168 hours.  Glucose Recent Labs  Lab 10/09/17 1140 10/09/17 1530 10/09/17 2019 10/09/17 2328 10/10/17 0408 10/10/17 0723  GLUCAP 141* 111* 123* 118* 104* 106*    Imaging No results found.   STUDIES:   CULTURES: Blood 2/1 >MRSA /E Coli  Urine 2/1 > Sputum 2/1 > C Diff 2/1 >neg    ANTIBIOTICS: Ciprofloxacin >>>1/22 Amikacin 1/30 >1/31 Levofloxacin 1/30 >1/31 Zerbaxa 2/1 >>>2/1 Linezolid 2/1 >>>2/1 Vanc 2/1 >> Avycaz 2/1>> 2/3 Meropenem 2/3 >   SIGNIFICANT EVENTS: 1/13 > 1/20 admit for PNA, UTI  LINES/TUBES: PICC RUE 1/18 >>>2/2 Trach 07/2016>>>   DISCUSSION: 67 y/o male with paraplegia from cerebellar atrophy here with HCAP, UTI and bacteremia from MRSA  and Ecoli.     ASSESSMENT / PLAN:  PULMONARY A: Chronic respiratory failure with nocturnal vent COPD without exacerbation P:   Continue nocturnal vent support Trach collar during daytime  CARDIOVASCULAR A:  Septic shock AFib with RVR > rate controlled  P:  Stop amiodarone Restart metoprolol Stop heparin with anemia TEE > consult cardiology  RENAL A:   No acute issues P:   Monitor BMET and UOP Replace electrolytes as needed   GASTROINTESTINAL A:   Constipation Chronic PEG P:   Hold tube feeding 2/4 with significant abdominal distension Dulcolax and senna today   HEMATOLOGIC A:   Anemia without bleeding  P:  Hold heparin indefinitely Transfuse 1 U PRBC  Goal Hgb > 7gm/dL  INFECTIOUS A:   Septic shock from MRSA and e coli bacteremia HCAP UTI P:   Continue vanc and  meropenem  ENDOCRINE A:   Hyperglycemia P:   continue SSI  NEUROLOGIC A:   Cerebellar atrophy P:   No sedation   FAMILY  - Updates: none bedside  - Inter-disciplinary family meet or Palliative Care meeting due by:  day 7     Heber Gret[place], MD [place] [facility] Pager: [phone] Cell: [phone] After 3pm or if no response, call [phone]   10/10/2017, 9:38 AM

## 2017-10-10 NOTE — Progress Notes (Signed)
eLink Physician-Brief Progress Note Patient Name: Craig DiegoCharlie J Tatsch Jr. DOB: Jun 30, 1951 MRN: [id number]   Date of Service  10/10/2017  HPI/Events of Note  Multiple issues:  1. Anemia - Hgb = 6.7 and 2. Abdomen is distended -  Patient is on a Heparin IV infusion for PAF. Also has history of cerebellar atrophy with associated paraplegia and Chronic PEG.   eICU Interventions  Will order: 1. Transfuse 1 unit PRBC. 2. Portable Abdominal Xray now.      Intervention Category Major Interventions: Other:  Brayen Bunn Dennard Nipugene 10/10/2017, 6:18 AM

## 2017-10-10 NOTE — Progress Notes (Signed)
    [facility] has been requested to perform a transesophageal echocardiogram on Mr. Craig Oneal for Bacteremia.  After careful review of history and examination, the risks and benefits of transesophageal echocardiogram have been explained including risks of esophageal damage, perforation (1:10,000 risk), bleeding, pharyngeal hematoma as well as other potential complications associated with conscious sedation including aspiration, arrhythmia, respiratory failure and death. Alternatives to treatment were discussed, questions were answered. Patient is willing to proceed (answered by nodding head). Daughter, Craig Oneal, has given verbal consult to proceed with procedure.  TEE - Dr. Delton SeeNelson @ 1-2 pm . NPO after midnight. Meds with sips.   Manson PasseyBhavinkumar Juliany Daughety, PA-C 10/10/2017 5:31 PM

## 2017-10-11 ENCOUNTER — Inpatient Hospital Stay (HOSPITAL_COMMUNITY): Payer: Medicare Other

## 2017-10-11 DIAGNOSIS — R7881 Bacteremia: Secondary | ICD-10-CM

## 2017-10-11 DIAGNOSIS — J449 Chronic obstructive pulmonary disease, unspecified: Secondary | ICD-10-CM | POA: Diagnosis not present

## 2017-10-11 DIAGNOSIS — A499 Bacterial infection, unspecified: Secondary | ICD-10-CM

## 2017-10-11 DIAGNOSIS — B964 Proteus (mirabilis) (morganii) as the cause of diseases classified elsewhere: Secondary | ICD-10-CM | POA: Diagnosis not present

## 2017-10-11 DIAGNOSIS — Z87440 Personal history of urinary (tract) infections: Secondary | ICD-10-CM | POA: Diagnosis not present

## 2017-10-11 DIAGNOSIS — R1032 Left lower quadrant pain: Secondary | ICD-10-CM

## 2017-10-11 DIAGNOSIS — N179 Acute kidney failure, unspecified: Secondary | ICD-10-CM | POA: Diagnosis not present

## 2017-10-11 DIAGNOSIS — J9610 Chronic respiratory failure, unspecified whether with hypoxia or hypercapnia: Secondary | ICD-10-CM | POA: Diagnosis not present

## 2017-10-11 DIAGNOSIS — Z1612 Extended spectrum beta lactamase (ESBL) resistance: Secondary | ICD-10-CM | POA: Diagnosis not present

## 2017-10-11 DIAGNOSIS — A4152 Sepsis due to Pseudomonas: Secondary | ICD-10-CM | POA: Diagnosis not present

## 2017-10-11 DIAGNOSIS — Z89611 Acquired absence of right leg above knee: Secondary | ICD-10-CM | POA: Diagnosis not present

## 2017-10-11 DIAGNOSIS — J9611 Chronic respiratory failure with hypoxia: Secondary | ICD-10-CM | POA: Diagnosis not present

## 2017-10-11 DIAGNOSIS — I5020 Unspecified systolic (congestive) heart failure: Secondary | ICD-10-CM | POA: Diagnosis not present

## 2017-10-11 DIAGNOSIS — Z930 Tracheostomy status: Secondary | ICD-10-CM | POA: Diagnosis not present

## 2017-10-11 DIAGNOSIS — G8220 Paraplegia, unspecified: Secondary | ICD-10-CM | POA: Diagnosis not present

## 2017-10-11 DIAGNOSIS — A4102 Sepsis due to Methicillin resistant Staphylococcus aureus: Secondary | ICD-10-CM | POA: Diagnosis not present

## 2017-10-11 DIAGNOSIS — B9562 Methicillin resistant Staphylococcus aureus infection as the cause of diseases classified elsewhere: Secondary | ICD-10-CM | POA: Diagnosis not present

## 2017-10-11 DIAGNOSIS — R6521 Severe sepsis with septic shock: Secondary | ICD-10-CM | POA: Diagnosis not present

## 2017-10-11 LAB — GLUCOSE, CAPILLARY
GLUCOSE-CAPILLARY: 64 mg/dL — AB (ref 65–99)
GLUCOSE-CAPILLARY: 68 mg/dL (ref 65–99)
GLUCOSE-CAPILLARY: 75 mg/dL (ref 65–99)
GLUCOSE-CAPILLARY: 79 mg/dL (ref 65–99)
GLUCOSE-CAPILLARY: 90 mg/dL (ref 65–99)
Glucose-Capillary: 70 mg/dL (ref 65–99)
Glucose-Capillary: 73 mg/dL (ref 65–99)
Glucose-Capillary: 162 mg/dL — ABNORMAL HIGH (ref 65–99)

## 2017-10-11 LAB — CBC WITH DIFFERENTIAL/PLATELET
BASOS ABS: 0.2 10*3/uL — AB (ref 0.0–0.1)
Basophils Relative: 1 %
EOS ABS: 0.2 10*3/uL (ref 0.0–0.7)
Eosinophils Relative: 1 %
HCT: 25.2 % — ABNORMAL LOW (ref 39.0–52.0)
Hemoglobin: 7.5 g/dL — ABNORMAL LOW (ref 13.0–17.0)
LYMPHS PCT: 11 %
Lymphs Abs: 1.8 10*3/uL (ref 0.7–4.0)
MCH: 23.6 pg — AB (ref 26.0–34.0)
MCHC: 29.8 g/dL — AB (ref 30.0–36.0)
MCV: 79.2 fL (ref 78.0–100.0)
Monocytes Absolute: 1 10*3/uL (ref 0.1–1.0)
Monocytes Relative: 6 %
NEUTROS ABS: 12.8 10*3/uL — AB (ref 1.7–7.7)
NEUTROS PCT: 81 %
PLATELETS: 156 10*3/uL (ref 150–400)
RBC: 3.18 MIL/uL — ABNORMAL LOW (ref 4.22–5.81)
RDW: 18.2 % — AB (ref 11.5–15.5)
WBC: 16 10*3/uL — ABNORMAL HIGH (ref 4.0–10.5)

## 2017-10-11 LAB — BPAM RBC
Blood Product Expiration Date: 201902062359
ISSUE DATE / TIME: 201902041052
Unit Type and Rh: 9500

## 2017-10-11 LAB — CULTURE, BLOOD (ROUTINE X 2): Special Requests: ADEQUATE

## 2017-10-11 LAB — BASIC METABOLIC PANEL
ANION GAP: 12 (ref 5–15)
BUN: 43 mg/dL — ABNORMAL HIGH (ref 6–20)
CALCIUM: 8.4 mg/dL — AB (ref 8.9–10.3)
CO2: 28 mmol/L (ref 22–32)
Chloride: 107 mmol/L (ref 101–111)
Creatinine, Ser: 0.87 mg/dL (ref 0.61–1.24)
GLUCOSE: 79 mg/dL (ref 65–99)
Potassium: 3.8 mmol/L (ref 3.5–5.1)
Sodium: 147 mmol/L — ABNORMAL HIGH (ref 135–145)

## 2017-10-11 LAB — TYPE AND SCREEN
ABO/RH(D): O POS
ANTIBODY SCREEN: NEGATIVE
Unit division: 0

## 2017-10-11 LAB — VANCOMYCIN, TROUGH: Vancomycin Tr: 28 ug/mL (ref 15–20)

## 2017-10-11 MED ORDER — DILTIAZEM HCL 30 MG PO TABS
30.0000 mg | ORAL_TABLET | Freq: Four times a day (QID) | ORAL | Status: DC
  Administered 2017-10-11 – 2017-10-14 (×14): 30 mg via ORAL
  Filled 2017-10-11 (×17): qty 1

## 2017-10-11 MED ORDER — VANCOMYCIN HCL 500 MG IV SOLR
500.0000 mg | Freq: Two times a day (BID) | INTRAVENOUS | Status: DC
  Administered 2017-10-12 – 2017-10-14 (×5): 500 mg via INTRAVENOUS
  Filled 2017-10-11 (×7): qty 500

## 2017-10-11 MED ORDER — DEXTROSE 50 % IV SOLN
INTRAVENOUS | Status: AC
  Administered 2017-10-12: 50 mL
  Filled 2017-10-11: qty 50

## 2017-10-11 MED ORDER — MIDAZOLAM HCL 2 MG/2ML IJ SOLN
2.0000 mg | Freq: Once | INTRAMUSCULAR | Status: AC
  Administered 2017-10-11: 2 mg via INTRAVENOUS

## 2017-10-11 MED ORDER — DEXTROSE-NACL 5-0.9 % IV SOLN
INTRAVENOUS | Status: DC
  Administered 2017-10-12: 50 mL/h via INTRAVENOUS

## 2017-10-11 MED ORDER — SODIUM CHLORIDE 0.9 % IV SOLN
1.0000 g | INTRAVENOUS | Status: DC
  Administered 2017-10-11 – 2017-10-14 (×4): 1 g via INTRAVENOUS
  Filled 2017-10-11 (×5): qty 1

## 2017-10-11 MED ORDER — MIDAZOLAM HCL 2 MG/2ML IJ SOLN
INTRAMUSCULAR | Status: AC
  Administered 2017-10-11: 2 mg
  Filled 2017-10-11: qty 4

## 2017-10-11 MED ORDER — DEXTROSE 50 % IV SOLN
INTRAVENOUS | Status: AC
  Administered 2017-10-11: 50 mL
  Filled 2017-10-11: qty 50

## 2017-10-11 MED ORDER — ERTAPENEM SODIUM 1 G IJ SOLR: 1.0000 g | INTRAMUSCULAR | Status: DC

## 2017-10-11 MED ORDER — DEXTROSE 50 % IV SOLN: 50.0000 mL | Freq: Once | INTRAVENOUS | Status: AC

## 2017-10-11 NOTE — Progress Notes (Signed)
eLink Physician-Brief Progress Note Patient Name: Craig DiegoCharlie J Millican Jr. DOB: 18-Aug-1951 MRN: [id number]   Date of Service  10/11/2017  HPI/Events of Note  Hypoglycemia - Blood glucose = 68. Currently on 0.9 NaCl IV infusion at 50 mL/hour.   eICU Interventions  Will order: 1. D/C 0.9 NaCl IV infusion.  2. D5 0.9 NaCl to run IV at 50 mL/hour.      Intervention Category Major Interventions: Other:  Sommer,Steven Dennard Nipugene 10/11/2017, 11:58 PM

## 2017-10-11 NOTE — Progress Notes (Signed)
RT note- Placed on ATC 40%, with PMV, tolerating well, good vocalization.

## 2017-10-11 NOTE — Progress Notes (Signed)
RT note- patient remains on ATC at 40%, PMV is off at this time, due to secretions and low sp02. Currently sp02 is 94%.

## 2017-10-11 NOTE — Progress Notes (Signed)
Pharmacy Antibiotic Note  Craig DiegoCharlie J Nichelson Jr. is a 67 y.o. male admitted on 10/06/2017 from [facility] with hypotension and possible sepsis. Found to have bacteremia with MRSA, E. Coli and proteus species. Patient is CKD stage 4 but renal function has improved today to a Scr of 0.99. Patient is paraplegic so would expect to see lower SCr levels. E. Coli is ESBL but sensitive to carbapenems. Pharmacy has been consulted to dose meropenem and vancomycin. WBC is down to 14.4 today and patient afebrile.    Today's vancomycin trough above goal at 28.  Plan: Meropenem 1 gm every 8 hours  Adjust vancomycin to 500 mg IV q 12 hrs - next dose tomorrow AM. Monitor renal function, clinical s/sx of improvement, repeat blood cultures   Height: 5\' 10"  (177.8 cm) Weight: 184 lb 11.9 oz (83.8 kg) IBW/kg (Calculated) : 73  Temp (24hrs), Avg:98.2 F (36.8 C), Min:98 F (36.7 C), Max:98.4 F (36.9 C)  Recent Labs  Lab 10/06/17 2243 10/06/17 2300 10/07/17 0130 10/08/17 0617 10/09/17 0615 10/09/17 1639 10/10/17 0324 10/11/17 0251 10/11/17 1637  WBC 13.6*  --   --  15.0* 14.4*  --  13.9* 16.0*  --   CREATININE 1.97*  --   --  1.17 0.99  --  0.86 0.87  --   LATICACIDVEN  --  1.80 1.32  --  1.2  --   --   --   --   VANCOTROUGH  --   --   --   --   --  28*  --   --  28*    Estimated Creatinine Clearance: 88.6 mL/min (by C-G formula based on SCr of 0.87 mg/dL).    No Known Allergies  Cdiff- negative  Flu-negative 1/31 BCx: 2/2 MRSA , 1/2 E. Coli (proteus also detected on BCID) ( E.coli S- Merrem) 1/31 Urine: Multiple species  2/2 Repeat BCx: in process   2/1 Vancomycin>> 2/1 Avycaz>>2/3 2/3 Merrem>> 2/01 Zerbaxa>>2/1 2/01 PO Vanc X 1  2/01 Linezolid X 1   Tad MooreJessica Khamiya Varin, Pharm D, BCPS  Clinical Pharmacist Pager [phone]  10/11/2017 5:40 PM

## 2017-10-11 NOTE — Progress Notes (Signed)
RT note-Placed back to full support for TEE.

## 2017-10-11 NOTE — CV Procedure (Signed)
     Transesophageal Echocardiogram Note  Craig Oneal. [id number] [birth year]  Procedure: Transesophageal Echocardiogram Indications: MRSA bacteremia  Procedure Details Consent: Obtained Time Out: Verified patient identification, verified procedure, site/side was marked, verified correct patient position, special equipment/implants available, Radiology Safety Procedures followed,  medications/allergies/relevent history reviewed, required imaging and test results available.  Performed  Medications: During this procedure the patient is administered a total of Versed 4 mg and Fentanyl 50 mcg to achieve and maintain moderate conscious sedation.  The patient's heart rate, blood pressure, and oxygen saturation are monitored continuously during the procedure. The period of conscious sedation is 30 minutes, of which I was present face-to-face 100% of this time.  No endocarditis was identified, for full report please see TEE dictation.    Complications: No apparent complications Patient did tolerate procedure well.  Tobias AlexanderKatarina Collins Kerby, MD, [facility]ACC 10/11/2017, 4:20 PM

## 2017-10-11 NOTE — Progress Notes (Addendum)
CSW aware that pt may d/c today if TEE  has been done. CSW contact [facility] to see if beds are available today for pt. CSW left VM asking that call be returned. CSW continues to follow for discharge needs at this time.      Claude MangesKierra S. Vontae Court, MSW, LCSW-A Emergency Department Clinical Social Worker [phone]

## 2017-10-11 NOTE — Progress Notes (Signed)
Placed on mechanical ventilation for nocturnal use.

## 2017-10-11 NOTE — Progress Notes (Signed)
PULMONARY / CRITICAL CARE MEDICINE   Name: Craig Oneal. MRN: [id number] DOB: 1951-01-24    ADMISSION DATE:  10/06/2017 CONSULTATION DATE:  2/1   REFERRING MD:  Allyne Gee in ED  CHIEF COMPLAINT:  fever  HISTORY OF PRESENT ILLNESS:   67 yo male chronically vent dependent at night with cerebellar atrophy syndrome admitted on 2/1 with septic shock due to polymicrobial bacteremia.     SUBJECTIVE:  Quiet night Currently enjoying his line holiday Plan for TEE today  VITAL SIGNS: BP (!) 110/56   Pulse 65   Temp 98.2 F (36.8 C) (Oral)   Resp 15   Ht 5\' 10"  (1.778 m)   Wt 184 lb 11.9 oz (83.8 kg)   SpO2 99%   BMI 26.51 kg/m   HEMODYNAMICS:    VENTILATOR SETTINGS: Vent Mode: PRVC FiO2 (%):  [40 %-60 %] 40 % Set Rate:  [15 bmp] 15 bmp Vt Set:  [500 mL] 500 mL PEEP:  [5 cmH20] 5 cmH20 Plateau Pressure:  [21 cmH20-22 cmH20] 21 cmH20  INTAKE / OUTPUT: I/O last 3 completed shifts: In: 3117.8 [I.V.:1137.8; Blood:345; Other:200; NG/GT:935; IV Piggyback:500] Out: 2985 [Urine:2985]  PHYSICAL EXAMINATION:  General:  In bed on vent HENT: NCAT Trach in place PULM: CTA B, vent supported breathing CV: RRR, no mgr GI: BS+, soft, nontender MSK: normal bulk and tone Neuro: awake, conversant, follows commands      LABS:  BMET Recent Labs  Lab 10/09/17 0615 10/10/17 0324 10/11/17 0251  NA 139 143 147*  K 3.5 3.9 3.8  CL 103 105 107  CO2 25 25 28   BUN 59* 53* 43*  CREATININE 0.99 0.86 0.87  GLUCOSE 132* 103* 79    Electrolytes Recent Labs  Lab 10/08/17 0617 10/09/17 0615 10/10/17 0324 10/11/17 0251  CALCIUM 8.1* 8.2* 8.1* 8.4*  MG 2.1  --   --   --   PHOS 1.8* 1.8*  --   --     CBC Recent Labs  Lab 10/09/17 0615 10/10/17 0324 10/11/17 0251  WBC 14.4* 13.9* 16.0*  HGB 7.4* 6.7* 7.5*  HCT 24.3* 22.7* 25.2*  PLT 184 159 156    Coag's Recent Labs  Lab 10/07/17 0400  INR 1.45    Sepsis Markers Recent Labs  Lab 10/06/17 2300  10/07/17 0130 10/07/17 0400 10/09/17 0615  LATICACIDVEN 1.80 1.32  --  1.2  PROCALCITON  --   --  45.77 21.68    ABG Recent Labs  Lab 10/06/17 2332 10/07/17 0414  PHART 7.331* 7.361  PCO2ART 29.7* 44.9  PO2ART 62.0* 176.0*    Liver Enzymes Recent Labs  Lab 10/06/17 2243 10/09/17 0615 10/10/17 1008  AST 91* 137* 92*  ALT 90* 160* 144*  ALKPHOS 537* 736* 744*  BILITOT 1.2 1.3* 1.1  ALBUMIN 2.0* 1.7* 1.7*    Cardiac Enzymes No results for input(s): TROPONINI, PROBNP in the last 168 hours.  Glucose Recent Labs  Lab 10/10/17 1550 10/10/17 2037 10/10/17 2319 10/11/17 0408 10/11/17 0634 10/11/17 0724  GLUCAP 95 85 80 70 73 90    Imaging Dg Abd Portable 1v  Result Date: 10/10/2017 CLINICAL DATA:  Abdominal distention. EXAM: PORTABLE ABDOMEN - 1 VIEW COMPARISON:  CT abdomen and pelvis 09/18/2017. FINDINGS: No free intraperitoneal air is identified. The bowel gas pattern is nonobstructive. Feeding tube is noted. No abnormal abdominal calcification or acute bony abnormality. IMPRESSION: Negative exam. Electronically Signed   By: Drusilla Kanner M.D.   On: 10/10/2017 09:58  STUDIES:   CULTURES: Blood 1/31 >MRSA /E Coli  Urine 1/31 > mult spec Sputum 2/1 > C Diff 2/1 >neg  Blood 2/2 >  Blood 2/4 > neg   ANTIBIOTICS: Ciprofloxacin >>>1/22 Amikacin 1/30 >1/31 Levofloxacin 1/30 >1/31 Zerbaxa 2/1 >>>2/1 Linezolid 2/1 >>>2/1 Vanc 2/1 >> Avycaz 2/1>> 2/3 Meropenem 2/3 >   SIGNIFICANT EVENTS: 1/13 > 1/20 admit for PNA, UTI  LINES/TUBES: PICC RUE 1/18 >>>2/2 Trach 07/2016>>>   DISCUSSION: 67 y/o male with paraplegia from cerebellar atrophy here with HCAP, UTI and bacteremia from MRSA and Ecoli.     ASSESSMENT / PLAN:  PULMONARY A: Chronic respiratory failure with nocturnal vent COPD without exacerbation P:   Continue vent a night Trach collar daytime  CARDIOVASCULAR A:  Septic shock AFib with RVR > rate controlled  P:  Continue  metoprolol Restart dilt Tele TEE today  RENAL A:   No acute issues P:   Monitor BMET and UOP Replace electrolytes as needed   GASTROINTESTINAL A:   Constipation Chronic PEG P:   Hold tube feeding for TEE today Bowel regimen  HEMATOLOGIC A:   Anemia without bleeding  P:  Continue to hold anticoagulation  INFECTIOUS A:   Septic shock from MRSA and e coli bacteremia > resolved HCAP UTI P:   Vanc/mero per ID TEE today  ENDOCRINE A:   Hyperglycemia P:   Continue SSI  NEUROLOGIC A:   Cerebellar atrophy P:   Hold sedation  FAMILY  - Updates: none bedside  - Inter-disciplinary family meet or Palliative Care meeting due by:  day 7    Heber CarolinaBrent McQuaid, MD [place] [facility] Pager: [phone] Cell: [phone] After 3pm or if no response, call [phone]   10/11/2017, 7:30 AM

## 2017-10-11 NOTE — Progress Notes (Signed)
  Echocardiogram 2D Echocardiogram has been performed.  Celene SkeenVijay  Deona Novitski 10/11/2017, 4:15 PM

## 2017-10-11 NOTE — Progress Notes (Signed)
Subjective: Patient endorses LLQ pain, denies recent BM. Otherwise no new complaints.  Antibiotics:  Anti-infectives (From admission, onward)   Start     Dose/Rate Route Frequency Ordered Stop   10/10/17 0600  vancomycin (VANCOCIN) IVPB 750 mg/150 ml premix     750 mg 150 mL/hr over 60 Minutes Intravenous Every 12 hours 10/09/17 1807     10/09/17 1400  meropenem (MERREM) 1 g in sodium chloride 0.9 % 100 mL IVPB     1 g 200 mL/hr over 30 Minutes Intravenous Every 8 hours 10/09/17 1221     10/08/17 1800  vancomycin (VANCOCIN) IVPB 1000 mg/200 mL premix  Status:  Discontinued     1,000 mg 200 mL/hr over 60 Minutes Intravenous Every 12 hours 10/08/17 0949 10/09/17 1807   10/08/17 1400  ceftazidime-avibactam (AVYCAZ) 2.5 g in dextrose 5 % 50 mL IVPB  Status:  Discontinued     2.5 g 25 mL/hr over 2 Hours Intravenous Every 8 hours 10/08/17 0948 10/09/17 1217   10/07/17 2200  linezolid (ZYVOX) IVPB 600 mg  Status:  Discontinued     600 mg 300 mL/hr over 60 Minutes Intravenous Every 12 hours 10/07/17 1609 10/07/17 1640   10/07/17 1800  ceftolozane-tazobactam (ZERBAXA) 750 mg in sodium chloride 0.9 % 100 mL IVPB  Status:  Discontinued     750 mg 105.7 mL/hr over 60 Minutes Intravenous Every 8 hours 10/07/17 1341 10/07/17 1706   10/07/17 1800  vancomycin (VANCOCIN) IVPB 750 mg/150 ml premix  Status:  Discontinued     750 mg 150 mL/hr over 60 Minutes Intravenous Every 12 hours 10/07/17 1640 10/08/17 0949   10/07/17 1800  ceftazidime-avibactam (AVYCAZ) 1.25 g in dextrose 5 % 50 mL IVPB  Status:  Discontinued     1.25 g 25 mL/hr over 2 Hours Intravenous Every 8 hours 10/07/17 1707 10/08/17 0948   10/07/17 1400  linezolid (ZYVOX) tablet 600 mg  Status:  Discontinued     600 mg Per Tube Every 12 hours 10/07/17 1341 10/07/17 1609   10/07/17 0515  vancomycin (VANCOCIN) 50 mg/mL oral solution 125 mg  Status:  Discontinued     125 mg Oral 4 times daily 10/07/17 0501 10/07/17 0844   10/07/17 0100  linezolid (ZYVOX) IVPB 600 mg     600 mg 300 mL/hr over 60 Minutes Intravenous  Once 10/07/17 0059 10/07/17 0220   10/07/17 0100  ceftolozane-tazobactam (ZERBAXA) 750 mg in sodium chloride 0.9 % 100 mL IVPB     750 mg 105.7 mL/hr over 60 Minutes Intravenous Every 8 hours 10/07/17 0059 10/07/17 1122      Medications: Scheduled Meds: . chlorhexidine gluconate (MEDLINE KIT)  15 mL Mouth Rinse BID  . diltiazem  30 mg Oral Q6H  . feeding supplement (PRO-STAT SUGAR FREE 64)  30 mL Per Tube BID  . insulin aspart  0-9 Units Subcutaneous Q4H  . ipratropium  0.5 mg Nebulization Q6H  . levalbuterol  0.63 mg Nebulization Q6H  . mouth rinse  15 mL Mouth Rinse QID  . metoprolol tartrate  12.5 mg Per Tube BID  . pantoprazole sodium  40 mg Per Tube QHS   Continuous Infusions: . sodium chloride 250 mL (10/09/17 1155)  . sodium chloride    . sodium chloride 1,000 mL (10/10/17 1333)  . sodium chloride    . lactated ringers Stopped (10/09/17 1442)  . meropenem (MERREM) IV 1 g (10/11/17 0517)  . norepinephrine (LEVOPHED) Adult infusion Stopped (10/09/17  1153)  . vancomycin 750 mg (10/11/17 0517)   PRN Meds:.sodium chloride, bisacodyl, fentaNYL (SUBLIMAZE) injection, fentaNYL (SUBLIMAZE) injection, sennosides  Objective: Weight change: 7 lb 15 oz (3.6 kg)  Intake/Output Summary (Last 24 hours) at 10/11/2017 0951 Last data filed at 10/11/2017 0800 Gross per 24 hour  Intake 2082 ml  Output 2020 ml  Net 62 ml   Blood pressure (!) 110/56, pulse (!) 105, temperature 98.2 F (36.8 C), temperature source Oral, resp. rate (!) 27, height 5' 10"  (1.778 m), weight 184 lb 11.9 oz (83.8 kg), SpO2 98 %. Temp:  [98.2 F (36.8 C)-99.4 F (37.4 C)] 98.2 F (36.8 C) (02/05 0726) Pulse Rate:  [33-113] 105 (02/05 0847) Resp:  [13-33] 27 (02/05 0847) BP: (70-128)/(36-76) 110/56 (02/05 0700) SpO2:  [93 %-100 %] 98 % (02/05 0847) FiO2 (%):  [40 %-60 %] 40 % (02/05 0847) Weight:  [184 lb 11.9 oz  (83.8 kg)] 184 lb 11.9 oz (83.8 kg) (02/05 0500)  Physical Exam: General: Alert and awake, oriented x3, not in any acute distress. CVS: irregularly irregular, no M/R/G, radial and R DP pulses intact Chest: no increased work of breathing, CTAB Abdomen: soft, distended, decreased bowel sounds, LLQ tenderness Extremities: s/p R AKA Skin: no rashes; site of previous PICC w/o acute skin changes or tenderness Neuro: paraplegic, able to move bil LEs with 2/5 strength  CBC: CBC Latest Ref Rng & Units 10/11/2017 10/10/2017 10/09/2017  WBC 4.0 - 10.5 K/uL 16.0(H) 13.9(H) 14.4(H)  Hemoglobin 13.0 - 17.0 g/dL 7.5(L) 6.7(LL) 7.4(L)  Hematocrit 39.0 - 52.0 % 25.2(L) 22.7(L) 24.3(L)  Platelets 150 - 400 K/uL 156 159 184    BMET Recent Labs    10/10/17 0324 10/11/17 0251  NA 143 147*  K 3.9 3.8  CL 105 107  CO2 25 28  GLUCOSE 103* 79  BUN 53* 43*  CREATININE 0.86 0.87  CALCIUM 8.1* 8.4*   Liver Panel  Recent Labs    10/09/17 0615 10/10/17 1008  PROT 6.9 6.8  ALBUMIN 1.7* 1.7*  AST 137* 92*  ALT 160* 144*  ALKPHOS 736* 744*  BILITOT 1.3* 1.1  BILIDIR  --  0.4  IBILI  --  0.7   Sedimentation Rate No results for input(s): ESRSEDRATE in the last 72 hours. C-Reactive Protein No results for input(s): CRP in the last 72 hours.  Micro Results: Recent Results (from the past 720 hour(s))  Urine Culture     Status: None   Collection Time: 09/18/17  3:37 AM  Result Value Ref Range Status   Specimen Description URINE, RANDOM  Final   Special Requests NONE  Final   Culture NO GROWTH  Final   Report Status 09/19/2017 FINAL  Final  Blood culture (routine x 2)     Status: None   Collection Time: 09/18/17  3:58 AM  Result Value Ref Range Status   Specimen Description BLOOD RIGHT HAND  Final   Special Requests   Final    BOTTLES DRAWN AEROBIC AND ANAEROBIC Blood Culture adequate volume   Culture NO GROWTH 5 DAYS  Final   Report Status 09/23/2017 FINAL  Final  Blood culture (routine x 2)      Status: None   Collection Time: 09/18/17  4:03 AM  Result Value Ref Range Status   Specimen Description BLOOD LEFT HAND  Final   Special Requests   Final    BOTTLES DRAWN AEROBIC AND ANAEROBIC Blood Culture adequate volume   Culture NO GROWTH 5 DAYS  Final  Report Status 09/23/2017 FINAL  Final  MRSA PCR Screening     Status: None   Collection Time: 09/18/17 12:20 PM  Result Value Ref Range Status   MRSA by PCR NEGATIVE NEGATIVE Final    Comment:        The GeneXpert MRSA Assay (FDA approved for NASAL specimens only), is one component of a comprehensive MRSA colonization surveillance program. It is not intended to diagnose MRSA infection nor to guide or monitor treatment for MRSA infections.   Culture, respiratory (NON-Expectorated)     Status: None   Collection Time: 09/19/17  2:11 PM  Result Value Ref Range Status   Specimen Description TRACHEAL ASPIRATE  Final   Special Requests NONE  Final   Gram Stain   Final    ABUNDANT WBC PRESENT,BOTH PMN AND MONONUCLEAR ABUNDANT GRAM NEGATIVE RODS MODERATE SQUAMOUS EPITHELIAL CELLS PRESENT    Culture MODERATE PSEUDOMONAS AERUGINOSA  Final   Report Status 09/22/2017 FINAL  Final   Organism ID, Bacteria PSEUDOMONAS AERUGINOSA  Final      Susceptibility   Pseudomonas aeruginosa - MIC*    CEFTAZIDIME 16 INTERMEDIATE Intermediate     CIPROFLOXACIN 1 SENSITIVE Sensitive     GENTAMICIN <=1 SENSITIVE Sensitive     IMIPENEM >=16 RESISTANT Resistant     CEFEPIME 16 INTERMEDIATE Intermediate     * MODERATE PSEUDOMONAS AERUGINOSA  Urine culture     Status: Abnormal   Collection Time: 10/06/17 10:43 PM  Result Value Ref Range Status   Specimen Description URINE, RANDOM  Final   Special Requests   Final    NONE Performed at [facility], [place] [address], [place], [place] [address]    Culture MULTIPLE SPECIES PRESENT, SUGGEST RECOLLECTION (A)  Final   Report Status 10/08/2017 FINAL  Final  Blood culture (routine x 2)      Status: Abnormal (Preliminary result)   Collection Time: 10/06/17 10:46 PM  Result Value Ref Range Status   Specimen Description BLOOD LEFT FOREARM  Final   Special Requests   Final    BOTTLES DRAWN AEROBIC AND ANAEROBIC Blood Culture adequate volume   Culture  Setup Time   Final    GRAM NEGATIVE RODS GRAM POSITIVE COCCI IN PAIRS IN CLUSTERS IN BOTH AEROBIC AND ANAEROBIC BOTTLES CRITICAL RESULT CALLED TO, READ BACK BY AND VERIFIED WITH: J MILLEN,PHARMD AT 0762 10/07/17 BY L BENFIELD    Culture (A)  Final    METHICILLIN RESISTANT STAPHYLOCOCCUS AUREUS ESCHERICHIA COLI Confirmed Extended Spectrum Beta-Lactamase Producer (ESBL).  In bloodstream infections from ESBL organisms, carbapenems are preferred over piperacillin/tazobactam. They are shown to have a lower risk of mortality. PROTEUS MIRABILIS SUSCEPTIBILITIES TO FOLLOW Performed at [facility], [place] [address] [place], [place] [address]    Report Status PENDING  Incomplete   Organism ID, Bacteria METHICILLIN RESISTANT STAPHYLOCOCCUS AUREUS  Final   Organism ID, Bacteria ESCHERICHIA COLI  Final      Susceptibility   Escherichia coli - MIC*    AMPICILLIN >=32 RESISTANT Resistant     CEFAZOLIN >=64 RESISTANT Resistant     CEFEPIME >=64 RESISTANT Resistant     CEFTAZIDIME RESISTANT Resistant     CEFTRIAXONE >=64 RESISTANT Resistant     CIPROFLOXACIN >=4 RESISTANT Resistant     GENTAMICIN <=1 SENSITIVE Sensitive     IMIPENEM <=0.25 SENSITIVE Sensitive     TRIMETH/SULFA >=320 RESISTANT Resistant     AMPICILLIN/SULBACTAM >=32 RESISTANT Resistant     PIP/TAZO <=4 SENSITIVE Sensitive  Extended ESBL POSITIVE Resistant     * ESCHERICHIA COLI   Methicillin resistant staphylococcus aureus - MIC*    CIPROFLOXACIN >=8 RESISTANT Resistant     ERYTHROMYCIN >=8 RESISTANT Resistant     GENTAMICIN <=0.5 SENSITIVE Sensitive     OXACILLIN >=4 RESISTANT Resistant     TETRACYCLINE <=1 SENSITIVE Sensitive     VANCOMYCIN 1 SENSITIVE  Sensitive     TRIMETH/SULFA <=10 SENSITIVE Sensitive     CLINDAMYCIN <=0.25 SENSITIVE Sensitive     RIFAMPIN <=0.5 SENSITIVE Sensitive     Inducible Clindamycin NEGATIVE Sensitive     * METHICILLIN RESISTANT STAPHYLOCOCCUS AUREUS  Blood Culture ID Panel (Reflexed)     Status: Abnormal   Collection Time: 10/06/17 10:46 PM  Result Value Ref Range Status   Enterococcus species NOT DETECTED NOT DETECTED Final   Listeria monocytogenes NOT DETECTED NOT DETECTED Final   Staphylococcus species DETECTED (A) NOT DETECTED Final    Comment: CRITICAL RESULT CALLED TO, READ BACK BY AND VERIFIED WITH: J MILLEN,PHARMD AT 1534 10/08/17 BY L BENFIELD    Staphylococcus aureus DETECTED (A) NOT DETECTED Final    Comment: Methicillin (oxacillin)-resistant Staphylococcus aureus (MRSA). MRSA is predictably resistant to beta-lactam antibiotics (except ceftaroline). Preferred therapy is vancomycin unless clinically contraindicated. Patient requires contact precautions if  hospitalized. CRITICAL RESULT CALLED TO, READ BACK BY AND VERIFIED WITH: J MILLEN,PHARMD AT 1534 10/08/17 BY L BENFIELD    Methicillin resistance DETECTED (A) NOT DETECTED Final    Comment: CRITICAL RESULT CALLED TO, READ BACK BY AND VERIFIED WITH: J MILLEN,PHARMD AT 1534 10/08/17 BY L BENFIELD    Streptococcus species NOT DETECTED NOT DETECTED Final   Streptococcus agalactiae NOT DETECTED NOT DETECTED Final   Streptococcus pneumoniae NOT DETECTED NOT DETECTED Final   Streptococcus pyogenes NOT DETECTED NOT DETECTED Final   Acinetobacter baumannii NOT DETECTED NOT DETECTED Final   Enterobacteriaceae species DETECTED (A) NOT DETECTED Final    Comment: CRITICAL RESULT CALLED TO, READ BACK BY AND VERIFIED WITH: J MILLEN,PHARMD AT 1534 10/08/17 BY L BENFIELD    Enterobacter cloacae complex NOT DETECTED NOT DETECTED Final   Escherichia coli DETECTED (A) NOT DETECTED Final    Comment: CRITICAL RESULT CALLED TO, READ BACK BY AND VERIFIED WITH: J  MILLEN,PHARMD AT 1534 10/08/17 BY L BENFIELD    Klebsiella oxytoca NOT DETECTED NOT DETECTED Final   Klebsiella pneumoniae NOT DETECTED NOT DETECTED Final   Proteus species DETECTED (A) NOT DETECTED Final    Comment: CRITICAL RESULT CALLED TO, READ BACK BY AND VERIFIED WITH: J MILLEN,PHARMD AT 1534 10/08/17 BY L BENFIELD    Serratia marcescens NOT DETECTED NOT DETECTED Final   Carbapenem resistance NOT DETECTED NOT DETECTED Final   Haemophilus influenzae NOT DETECTED NOT DETECTED Final   Neisseria meningitidis NOT DETECTED NOT DETECTED Final   Pseudomonas aeruginosa NOT DETECTED NOT DETECTED Final   Candida albicans NOT DETECTED NOT DETECTED Final   Candida glabrata NOT DETECTED NOT DETECTED Final   Candida krusei NOT DETECTED NOT DETECTED Final   Candida parapsilosis NOT DETECTED NOT DETECTED Final   Candida tropicalis NOT DETECTED NOT DETECTED Final    Comment: Performed at [facility], [address], [place], [place] [address]  Blood culture (routine x 2)     Status: Abnormal   Collection Time: 10/06/17 11:00 PM  Result Value Ref Range Status   Specimen Description BLOOD RIGHT ARM  Final   Special Requests   Final    BOTTLES DRAWN AEROBIC AND  ANAEROBIC Blood Culture adequate volume   Culture  Setup Time   Final    GRAM POSITIVE COCCI IN CLUSTERS IN BOTH AEROBIC AND ANAEROBIC BOTTLES CRITICAL VALUE NOTED.  VALUE IS CONSISTENT WITH PREVIOUSLY REPORTED AND CALLED VALUE.    Culture (A)  Final    STAPHYLOCOCCUS AUREUS SUSCEPTIBILITIES PERFORMED ON PREVIOUS CULTURE WITHIN THE LAST 5 DAYS. Performed at [facility], [place] [address] [place], [place] [address]    Report Status 10/09/2017 FINAL  Final  C difficile quick scan w PCR reflex     Status: None   Collection Time: 10/07/17  4:20 AM  Result Value Ref Range Status   C Diff antigen NEGATIVE NEGATIVE Final   C Diff toxin NEGATIVE NEGATIVE Final   C Diff interpretation No C. difficile detected.  Final  Culture, blood  (Routine X 2) w Reflex to ID Panel     Status: None (Preliminary result)   Collection Time: 10/08/17 11:06 AM  Result Value Ref Range Status   Specimen Description BLOOD LEFT ANTECUBITAL  Final   Special Requests   Final    BOTTLES DRAWN AEROBIC AND ANAEROBIC Blood Culture adequate volume   Culture   Final    NO GROWTH 2 DAYS Performed at [facility], [address], [place], [place] [address]    Report Status PENDING  Incomplete    Studies/Results: Dg Abd Portable 1v  Result Date: 10/10/2017 CLINICAL DATA:  Abdominal distention. EXAM: PORTABLE ABDOMEN - 1 VIEW COMPARISON:  CT abdomen and pelvis 09/18/2017. FINDINGS: No free intraperitoneal air is identified. The bowel gas pattern is nonobstructive. Feeding tube is noted. No abnormal abdominal calcification or acute bony abnormality. IMPRESSION: Negative exam. Electronically Signed   By: Inge Rise M.D.   On: 10/10/2017 09:58    Assessment/Plan:  INTERVAL HISTORY:  PICC was d/c'd 02/02 BCx 2/2 NGTD (prior to PICC removal) BCx 2/4 pending Off of all continuous infusions  Active Problems:   AKI (acute kidney injury) ([place])   Chronic respiratory failure (HCC)   Septic shock (HCC)   Chronic obstructive pulmonary disease (HCC)   History of Clostridium difficile infection   Pressure injury of skin   MRSA bacteremia   E coli bacteremia   Bacterial infection due to Proteus mirabilis   Infection of peripherally inserted central venous catheter (PICC)   History of MDR Pseudomonas aeruginosa infection   ESBL (extended spectrum beta-lactamase) producing bacteria infection   Craig J Jemuel Laursen. is a 67 y.o. male with paraplegia, COPD, PAF, HFrEF, chronic respiratory failure s/p trach and PEG with multiple recent admissions for UTIs, MDR pseudomonas and Candidemia who presented to [facility] with septic shock, found to have MRSA and ESBL bacteremia.   Plan: --continue vanc and meropenem - appreciate pharmacy assistance with dosing;  Vanc for at least 6 weeks, meropenem 7-10 days post PICC removal (2/2) --TEE today to evaluate for vegetations --f/u repeat blood cultures from 2/2, 2/4 (post PICC removal) --bowel regimen per primary   LOS: 4 days   Craig Oneal  PGY 2 Infectious Diseases [phone] 10/11/2017, 9:51 AM

## 2017-10-12 DIAGNOSIS — R6521 Severe sepsis with septic shock: Secondary | ICD-10-CM | POA: Diagnosis not present

## 2017-10-12 DIAGNOSIS — J9610 Chronic respiratory failure, unspecified whether with hypoxia or hypercapnia: Secondary | ICD-10-CM | POA: Diagnosis not present

## 2017-10-12 DIAGNOSIS — R509 Fever, unspecified: Secondary | ICD-10-CM | POA: Diagnosis not present

## 2017-10-12 DIAGNOSIS — Z1612 Extended spectrum beta lactamase (ESBL) resistance: Secondary | ICD-10-CM | POA: Diagnosis not present

## 2017-10-12 DIAGNOSIS — R7881 Bacteremia: Secondary | ICD-10-CM | POA: Diagnosis not present

## 2017-10-12 DIAGNOSIS — Z930 Tracheostomy status: Secondary | ICD-10-CM | POA: Diagnosis not present

## 2017-10-12 DIAGNOSIS — I480 Paroxysmal atrial fibrillation: Secondary | ICD-10-CM | POA: Diagnosis not present

## 2017-10-12 DIAGNOSIS — A4151 Sepsis due to Escherichia coli [E. coli]: Secondary | ICD-10-CM | POA: Diagnosis not present

## 2017-10-12 DIAGNOSIS — B9562 Methicillin resistant Staphylococcus aureus infection as the cause of diseases classified elsewhere: Secondary | ICD-10-CM | POA: Diagnosis not present

## 2017-10-12 DIAGNOSIS — I5020 Unspecified systolic (congestive) heart failure: Secondary | ICD-10-CM | POA: Diagnosis not present

## 2017-10-12 DIAGNOSIS — J449 Chronic obstructive pulmonary disease, unspecified: Secondary | ICD-10-CM | POA: Diagnosis not present

## 2017-10-12 DIAGNOSIS — R140 Abdominal distension (gaseous): Secondary | ICD-10-CM | POA: Diagnosis not present

## 2017-10-12 DIAGNOSIS — N179 Acute kidney failure, unspecified: Secondary | ICD-10-CM | POA: Diagnosis not present

## 2017-10-12 DIAGNOSIS — G8220 Paraplegia, unspecified: Secondary | ICD-10-CM | POA: Diagnosis not present

## 2017-10-12 DIAGNOSIS — B964 Proteus (mirabilis) (morganii) as the cause of diseases classified elsewhere: Secondary | ICD-10-CM | POA: Diagnosis not present

## 2017-10-12 LAB — GLUCOSE, CAPILLARY
GLUCOSE-CAPILLARY: 112 mg/dL — AB (ref 65–99)
GLUCOSE-CAPILLARY: 133 mg/dL — AB (ref 65–99)
GLUCOSE-CAPILLARY: 138 mg/dL — AB (ref 65–99)
Glucose-Capillary: 94 mg/dL (ref 65–99)
Glucose-Capillary: 82 mg/dL (ref 65–99)
Glucose-Capillary: 107 mg/dL — ABNORMAL HIGH (ref 65–99)
Glucose-Capillary: 116 mg/dL — ABNORMAL HIGH (ref 65–99)

## 2017-10-12 LAB — COMPREHENSIVE METABOLIC PANEL
ALBUMIN: 1.7 g/dL — AB (ref 3.5–5.0)
ALT: 74 U/L — ABNORMAL HIGH (ref 17–63)
AST: 32 U/L (ref 15–41)
Alkaline Phosphatase: 642 U/L — ABNORMAL HIGH (ref 38–126)
Anion gap: 10 (ref 5–15)
BUN: 25 mg/dL — AB (ref 6–20)
CO2: 28 mmol/L (ref 22–32)
Calcium: 8.7 mg/dL — ABNORMAL LOW (ref 8.9–10.3)
Chloride: 112 mmol/L — ABNORMAL HIGH (ref 101–111)
Creatinine, Ser: 0.8 mg/dL (ref 0.61–1.24)
GFR calc Af Amer: >60 mL/min (ref >60)
Glucose, Bld: 110 mg/dL — ABNORMAL HIGH (ref 65–99)
Potassium: 3.2 mmol/L — ABNORMAL LOW (ref 3.5–5.1)
Sodium: 150 mmol/L — ABNORMAL HIGH (ref 135–145)
Total Bilirubin: 1 mg/dL (ref 0.3–1.2)
Total Protein: 7.2 g/dL (ref 6.5–8.1)

## 2017-10-12 LAB — CBC
HEMATOCRIT: 24.8 % — AB (ref 39.0–52.0)
Hemoglobin: 7.4 g/dL — ABNORMAL LOW (ref 13.0–17.0)
MCH: 24 pg — ABNORMAL LOW (ref 26.0–34.0)
MCHC: 29.8 g/dL — AB (ref 30.0–36.0)
MCV: 80.5 fL (ref 78.0–100.0)
Platelets: 179 10*3/uL (ref 150–400)
RBC: 3.08 MIL/uL — ABNORMAL LOW (ref 4.22–5.81)
RDW: 18.2 % — AB (ref 11.5–15.5)
WBC: 13.3 10*3/uL — AB (ref 4.0–10.5)

## 2017-10-12 LAB — MAGNESIUM: MAGNESIUM: 1.9 mg/dL (ref 1.7–2.4)

## 2017-10-12 MED ORDER — VITAL AF 1.2 CAL PO LIQD
1000.0000 mL | ORAL | Status: DC
  Administered 2017-10-12 – 2017-10-14 (×4): 1000 mL

## 2017-10-12 MED ORDER — DEXTROSE 50 % IV SOLN
50.0000 mL | Freq: Once | INTRAVENOUS | Status: AC
  Administered 2017-10-12: 50 mL via INTRAVENOUS

## 2017-10-12 MED ORDER — POTASSIUM CHLORIDE CRYS ER 20 MEQ PO TBCR: 40.0000 meq | EXTENDED_RELEASE_TABLET | Freq: Two times a day (BID) | ORAL | Status: DC

## 2017-10-12 MED ORDER — FREE WATER
200.0000 mL | Freq: Three times a day (TID) | Status: DC
  Administered 2017-10-12 – 2017-10-14 (×6): 200 mL

## 2017-10-12 MED ORDER — VITAL HIGH PROTEIN PO LIQD
1000.0000 mL | ORAL | Status: DC
  Administered 2017-10-12: 1000 mL

## 2017-10-12 MED ORDER — POTASSIUM CHLORIDE 20 MEQ/15ML (10%) PO SOLN
40.0000 meq | Freq: Two times a day (BID) | ORAL | Status: AC
  Administered 2017-10-12 (×2): 40 meq via ORAL
  Filled 2017-10-12 (×2): qty 30

## 2017-10-12 MED ORDER — LEVOTHYROXINE SODIUM 125 MCG PO TABS
125.0000 ug | ORAL_TABLET | Freq: Every day | ORAL | Status: DC
  Administered 2017-10-12 – 2017-10-14 (×3): 125 ug
  Filled 2017-10-12 (×4): qty 1

## 2017-10-12 NOTE — Progress Notes (Addendum)
1:42pm- CSW spoke with pt's daughter Sunny SchleinFelicia to discus other placement options for pt being that [facility] is unable to take pt due to no beds today. CSW informed pt's daughter that the next step in finding pt a new place (if needed) [place] of state. Pt's daughter stated "NO MA'AM". CSW went on to explain the barrier in getting pt back to [facility], and pt;'s daughter expressed that they did not request a bed hold for pt at [facility] as holding a bed is $500 per day. CSW informed daughter that CSW has sought further assistance in getting pt back to [facility] and informed her that CSW would reach out to daughter again before sending pt's information to new facilities. Daughter wanted to know how long pt can stay in the hospital and CSW informed daughter that per NP pt was being discharged today. CSW remains available for support at this time.   12: 55pm- [facility] not [facility] benefits. CSW reached out to Medical Director (Dr. Mervyn SkeetersA) for next steps at this time.   12:53pm- CSW informed by Misty StanleyStacey at [facility] that they do not have a bed for pt this week, and are no expecting any new discharges this week. Misty StanleyStacey informed CSW that she would call CSW back with when they would have beds. CSW to follow up with RNCM to see if pt has LTACH benefits.   12:46pm- CSW reached out to admissions again, and left VM for call back.   CSW informed that pt can go back to [facility] today. CSW has reached out to [facility] admissions twice today to see what time CSW can send pt back to the facility as well as if facility has a bed today.. CSW remains available for support that this time.     Claude MangesKierra S. Quenton Recendez, MSW, LCSW-A Emergency Department Clinical Social Worker [phone]

## 2017-10-12 NOTE — Care Management (Signed)
CSW will facilitate discharge back to [facility] - pt is from SNF side.  Pt does not have remaining days on LTACH benefit

## 2017-10-12 NOTE — NC FL2 (Signed)
[place] LEVEL OF CARE SCREENING TOOL     IDENTIFICATION  Patient Name: Craig Oneal. Birthdate: Dec 11, 1950 Sex: male Admission Date (Current Location): 10/06/2017  [facility] and [place] Number:  Herbalist and Address:  [facility], [place] [address] [place], [place] [address]      Provider Number: [id number]  Attending Physician Name and Address:  Juanito Doom, MD  Relative Name and Phone Number:  [employer] [phone].     Current Level of Care: Hospital Recommended Level of Care: [facility] Prior Approval Number:    Date Approved/Denied:   PASRR Number:   [id number]   Discharge Plan: Other (Comment)([facility]. )    Current Diagnoses: Patient Active Problem List   Diagnosis Date Noted  . ESBL (extended spectrum beta-lactamase) producing bacteria infection   . Septic shock ([place]) 10/07/2017  . Pressure injury of skin 10/07/2017  . Chronic obstructive pulmonary disease ([place])   . History of Clostridium difficile infection   . MRSA bacteremia   . E coli bacteremia   . Bacterial infection due to Proteus mirabilis   . Infection of peripherally inserted central venous catheter (PICC)   . History of MDR Pseudomonas aeruginosa infection   . UTI (urinary tract infection) 09/18/2017  . E-coli UTI 09/18/2017  . Pneumonia due to Serratia marcescens ([place]) 09/18/2017  . Moraxella catarrhalis pneumonia ([place]) 09/18/2017  . Other cirrhosis of liver ([place]) 09/18/2017  . Chronic bilateral pleural effusions 09/18/2017  . Gastrostomy tube dependent ([place]) 09/18/2017  . 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 Cerebellar atrophy 09/18/2017  . Sepsis ([place]) 09/18/2017  . Atherosclerosis of aortic bifurcation and common iliac arteries ([place]) 09/18/2017  . Chronic respiratory failure ([place])   . Bilateral hydronephrosis   . Carbapenem-resistant Enterobacteriaceae infection   . Candida glabrata infection   .  Chronic systolic CHF (congestive heart failure) ([place])   . Pulmonary hypertension ([place])   . Atrial fibrillation, chronic ([place])   . Acute renal failure superimposed on stage 3 chronic kidney disease ([place])   . Hypernatremia   . Hypokalemia   . Hypomagnesemia   . Chronic indwelling Foley catheter   . Acute posthemorrhagic anemia   . S/P AKA (above knee amputation) unilateral, right ([place])   . Penile laceration   . Ventilator dependence ([place])   . Candidemia ([place])   . Tracheostomy status ([place])   . Hypoxemia   . Acute on chronic respiratory failure with hypoxemia ([place])   . Acute respiratory failure ([place])   . Amputation of right lower extremity above knee with complication ([place])   . Acute encephalopathy 08/01/2017  . HCAP (healthcare-associated pneumonia)   . Hyperkalemia   . AKI (acute kidney injury) ([place])   . Atrial fibrillation with rapid ventricular response ([place])   . Acute pulmonary edema (H[facility])   . Decubitus ulcer of right ischial area, stage IV ([place]) 01/23/2017    Orientation RESPIRATION BLADDER Height & Weight     Self, Time, Situation, Place  Vent Continent Weight: 179 lb 14.3 oz (81.6 kg) Height:  5' 10" (177.8 cm)  BEHAVIORAL SYMPTOMS/MOOD NEUROLOGICAL BOWEL NUTRITION STATUS      Incontinent Diet(please see discharge summary. )  AMBULATORY STATUS COMMUNICATION OF NEEDS Skin   Total Care Non-Verbally Normal                       Personal Care Assistance Level of Assistance  Total care  Total Care Assistance: Maximum assistance   Functional Limitations Info  Sight, Hearing, Speech Sight Info: Adequate Hearing Info: Adequate Speech Info: Impaired(pt has trach. )    SPECIAL CARE FACTORS FREQUENCY  PT (By licensed PT), OT (By licensed OT)     PT Frequency: 5 times a week  OT Frequency: 5 times a week            Contractures Contractures Info: Not present    Additional Factors Info  Code Status, Allergies Code Status Info: Full  Allergies Info:  NKA     Isolation Precautions Info: ESLB, VRE     Current Medications (10/12/2017):  This is the current hospital active medication list Current Facility-Administered Medications  Medication Dose Route Frequency Provider Last Rate Last Dose  . 0.9 %  sodium chloride infusion  250 mL Intravenous PRN Hoffman, Paul W, NP 10 mL/hr at 10/11/17 1800 250 mL at 10/11/17 1800  . 0.9 %  sodium chloride infusion   Intravenous Once Sommer, Steven E, MD      . 0.9 %  sodium chloride infusion   Intravenous Continuous Bhagat, Bhavinkumar, PA      . bisacodyl (DULCOLAX) suppository 10 mg  10 mg Rectal Daily PRN McQuaid, Douglas B, MD      . chlorhexidine gluconate (MEDLINE KIT) (PERIDEX) 0.12 % solution 15 mL  15 mL Mouth Rinse BID Scatliffe, Kristen D, MD   15 mL at 10/12/17 0850  . dextrose 5 %-0.9 % sodium chloride infusion   Intravenous Continuous Sommer, Steven E, MD   Stopped at 10/12/17 1136  . diltiazem (CARDIZEM) tablet 30 mg  30 mg Oral Q6H McQuaid, Douglas B, MD   30 mg at 10/12/17 1137  . ertapenem (INVANZ) 1 g in sodium chloride 0.9 % 50 mL IVPB  1 g Intravenous Q24H McQuaid, Douglas B, MD   Stopped at 10/12/17 1347  . feeding supplement (VITAL AF 1.2 CAL) liquid 1,000 mL  1,000 mL Per Tube Continuous McQuaid, Douglas B, MD 75 mL/hr at 10/12/17 1136 1,000 mL at 10/12/17 1136  . fentaNYL (SUBLIMAZE) injection 50 mcg  50 mcg Intravenous Q15 min PRN Hoffman, Paul W, NP   25 mcg at 10/11/17 1527  . fentaNYL (SUBLIMAZE) injection 50 mcg  50 mcg Intravenous Q2H PRN Hoffman, Paul W, NP      . free water 200 mL  200 mL Per Tube Q8H Simpson, Paula B, NP   200 mL at 10/12/17 1319  . insulin aspart (novoLOG) injection 0-9 Units  0-9 Units Subcutaneous Q4H Alva, Rakesh V, MD   1 Units at 10/09/17 2132  . ipratropium (ATROVENT) nebulizer solution 0.5 mg  0.5 mg Nebulization Q6H Hoffman, Paul W, NP   0.5 mg at 10/12/17 1312  . levalbuterol (XOPENEX) nebulizer solution 0.63 mg  0.63 mg Nebulization Q6H Hoffman,  Paul W, NP   0.63 mg at 10/12/17 1312  . levothyroxine (SYNTHROID, LEVOTHROID) tablet 125 mcg  125 mcg Per Tube QAC breakfast Simpson, Paula B, NP   125 mcg at 10/12/17 1007  . MEDLINE mouth rinse  15 mL Mouth Rinse QID Scatliffe, Kristen D, MD   15 mL at 10/12/17 1140  . metoprolol tartrate (LOPRESSOR) 25 mg/10 mL oral suspension 12.5 mg  12.5 mg Per Tube BID McQuaid, Douglas B, MD   12.5 mg at 10/12/17 0911  . pantoprazole sodium (PROTONIX) 40 mg/20 mL oral suspension 40 mg  40 mg Per Tube QHS Yacoub, Wesam G, MD   40 mg at   10/11/17 2300  . potassium chloride 20 MEQ/15ML (10%) solution 40 mEq  40 mEq Oral BID McQuaid, Douglas B, MD   40 mEq at 10/12/17 1403  . sennosides (SENOKOT) 8.8 MG/5ML syrup 5 mL  5 mL Per Tube Daily PRN McQuaid, Douglas B, MD      . vancomycin (VANCOCIN) 500 mg in sodium chloride 0.9 % 100 mL IVPB  500 mg Intravenous Q12H Carney, Jessica C, RPH 100 mL/hr at 10/12/17 0553 500 mg at 10/12/17 0553     Discharge Medications: Please see discharge summary for a list of discharge medications.  Relevant Imaging Results:  Relevant Lab Results:   Additional Information SSN- [id number] Pt had trach placed in 2017. Portex 8mm Cuffed.   Kierra S Wiley, LCSWA    

## 2017-10-12 NOTE — Progress Notes (Signed)
PULMONARY / CRITICAL CARE MEDICINE   Name: Craig Oneal. MRN: [id number] DOB: 07/27/1951    ADMISSION DATE:  10/06/2017 CONSULTATION DATE:  2/1   REFERRING MD:  [facility] in ED  CHIEF COMPLAINT:  fever  HISTORY OF PRESENT ILLNESS:   67 yo male chronically vent dependent at night with cerebellar atrophy syndrome admitted on 2/1 with septic shock due to polymicrobial bacteremia.     SUBJECTIVE:  Repeat BC ngtd x 3 Mild hypoglycemia  Nocturnal vent at night, tolerating TC 40% during day   VITAL SIGNS: BP 117/64   Pulse 99   Temp (!) 101.5 F (38.6 C) (Oral)   Resp 18   Ht 5' 10"  (1.778 m)   Wt 179 lb 14.3 oz (81.6 kg)   SpO2 95%   BMI 25.81 kg/m   HEMODYNAMICS:    VENTILATOR SETTINGS: Vent Mode: PRVC FiO2 (%):  [30 %-40 %] 40 % Set Rate:  [15 bmp] 15 bmp Vt Set:  [500 mL] 500 mL PEEP:  [5 cmH20] 5 cmH20 Plateau Pressure:  [18 cmH20-20 cmH20] 18 cmH20  INTAKE / OUTPUT: I/O last 3 completed shifts: In: 2230 [I.V.:2100; Other:80; IV Piggyback:50] Out: 8110 [Urine:3260]  PHYSICAL EXAMINATION: General:  Adult male lying in bed in NAD HEENT: [place]/AT, 8# portex trach midline, site c/d/i Neuro: Awake, mouths words, paraplegia CV: irir PULM: even/non-labored on TC 40%, scattered rhonchi, thick clear secretions, good cough GI: distended/ round, not firm, NT, hyperactive BS  Extremities: warm/dry, R aka, no LLE edema  Skin: no rashes   LABS:  BMET Recent Labs  Lab 10/09/17 0615 10/10/17 0324 10/11/17 0251  NA 139 143 147*  K 3.5 3.9 3.8  CL 103 105 107  CO2 25 25 28   BUN 59* 53* 43*  CREATININE 0.99 0.86 0.87  GLUCOSE 132* 103* 79    Electrolytes Recent Labs  Lab 10/08/17 0617 10/09/17 0615 10/10/17 0324 10/11/17 0251  CALCIUM 8.1* 8.2* 8.1* 8.4*  MG 2.1  --   --   --   PHOS 1.8* 1.8*  --   --     CBC Recent Labs  Lab 10/10/17 0324 10/11/17 0251 10/12/17 0335  WBC 13.9* 16.0* 13.3*  HGB 6.7* 7.5* 7.4*  HCT 22.7* 25.2* 24.8*  PLT 159  156 179    Coag's Recent Labs  Lab 10/07/17 0400  INR 1.45    Sepsis Markers Recent Labs  Lab 10/06/17 2300 10/07/17 0130 10/07/17 0400 10/09/17 0615  LATICACIDVEN 1.80 1.32  --  1.2  PROCALCITON  --   --  45.77 21.68    ABG Recent Labs  Lab 10/06/17 2332 10/07/17 0414  PHART 7.331* 7.361  PCO2ART 29.7* 44.9  PO2ART 62.0* 176.0*    Liver Enzymes Recent Labs  Lab 10/06/17 2243 10/09/17 0615 10/10/17 1008  AST 91* 137* 92*  ALT 90* 160* 144*  ALKPHOS 537* 736* 744*  BILITOT 1.2 1.3* 1.1  ALBUMIN 2.0* 1.7* 1.7*    Cardiac Enzymes No results for input(s): TROPONINI, PROBNP in the last 168 hours.  Glucose Recent Labs  Lab 10/11/17 1921 10/11/17 2006 10/11/17 2353 10/12/17 0024 10/12/17 0350 10/12/17 0719  GLUCAP 64* 162* 68 138* 94 82    Imaging No results found.  CULTURES: Blood1/31 > 2/2 MRSA , 1/2 E. Coli (proteus also detected on BCID) ( E.coli S- Merrem) Urine1/31 >mult spec Sputum 2/1 > C Diff 2/1 >neg Blood 2/2 > Blood 2/4 > neg  ANTIBIOTICS: Ciprofloxacin >>>1/22 Amikacin 1/30 >1/31 Levofloxacin 1/30 >1/31  Zerbaxa 2/1 x 1 dose Linezolid 2/1 x 1 dose Vanc PO 2/1 x1 dose Vanc 2/1 >> Avycaz 2/1>> 2/3 Meropenem 2/3 >2/5 Invanz 2/5 >>   SIGNIFICANT EVENTS: 1/13 > 1/20 admit for PNA, UTI  LINES/TUBES: PICC RUE 1/18 >>>2/2 Trach 07/2016>>> PIV x 2   DISCUSSION: 67 y/o male with paraplegia from cerebellar atrophy here with HCAP, UTI and bacteremia from MRSA and Ecoli.  TEE negative.  ASSESSMENT / PLAN:   PULMONARY A: Chronic respiratory failure with nocturnal vent COPD without exacerbation P:   Continue vent a night - PRVC 15/5/500/ 30% Trach collar daytime  CARDIOVASCULAR A:  Septic shock 2/2 bacteremia AFib with RVR > rate controlled  - TEE 2/6 negative for IE P:  Continue home metoprolol and dilt Tele  RENAL A:   No acute issues P:   Monitor BMET and UOP Replace electrolytes as  needed  GASTROINTESTINAL A:   Constipation Chronic PEG Elevated Alk phos/ mild Transaminitis - improving  P:   Restart TF today, consider restarting free water depending on am labs Bowel regimen Recheck LFT  HEMATOLOGIC A:   Anemia without bleeding - stable - near his baseline Hgb prior to admit P:  SCDS for VTE Hold anticoagulation given anemia and bleeding risk  Monitor for bleeding Transfuse for Hgb < 7  INFECTIOUS A:   Septic shock from MRSA and e coli bacteremia > resolved HCAP UTI MDRO P:   Vanc/invanz per ID TEE neg for IE Continue PIV x 2 for line holiday Follow repeat BC 2/4, ngtd  ENDOCRINE A:   Hyperglycemia Hypoglycemia- mild 2/6 Hypothyroidism  P:   Continue SSI Restart TF, d/c IVF after TF restarted Restart home synthroid   NEUROLOGIC A:   Cerebellar atrophy P:   Hold sedation  FAMILY  - Updates: no family at bedside 2/5; 2/6  Global: Case management consulted in regards to arranging transfer/ confirming bed back at [facility].    - Inter-disciplinary family meet or Palliative Care meeting due by:  day [place], [facility] [place] Pgr: [phone] or if no answer [phone] 10/12/2017, 8:22 AM

## 2017-10-12 NOTE — Progress Notes (Signed)
Subjective: Patient denies subjective fevers or chills, chest pain, shortness of breath, abd pain, N/V. He did have BM. He had recorded fever to 101.46F this AM which on recheck after no intervention was resolved.  Antibiotics:  Anti-infectives (From admission, onward)   Start     Dose/Rate Route Frequency Ordered Stop   10/12/17 0500  vancomycin (VANCOCIN) 500 mg in sodium chloride 0.9 % 100 mL IVPB     500 mg 100 mL/hr over 60 Minutes Intravenous Every 12 hours 10/11/17 1738     10/11/17 1400  ertapenem (INVANZ) 1 g in sodium chloride 0.9 % 50 mL IVPB     1 g 100 mL/hr over 30 Minutes Intravenous Every 24 hours 10/11/17 1247     10/11/17 1245  ertapenem (INVANZ) injection 1 g  Status:  Discontinued     1 g Intramuscular Every 24 hours 10/11/17 1244 10/11/17 1247   10/10/17 0600  vancomycin (VANCOCIN) IVPB 750 mg/150 ml premix  Status:  Discontinued     750 mg 150 mL/hr over 60 Minutes Intravenous Every 12 hours 10/09/17 1807 10/11/17 1729   10/09/17 1400  meropenem (MERREM) 1 g in sodium chloride 0.9 % 100 mL IVPB  Status:  Discontinued     1 g 200 mL/hr over 30 Minutes Intravenous Every 8 hours 10/09/17 1221 10/11/17 1244   10/08/17 1800  vancomycin (VANCOCIN) IVPB 1000 mg/200 mL premix  Status:  Discontinued     1,000 mg 200 mL/hr over 60 Minutes Intravenous Every 12 hours 10/08/17 0949 10/09/17 1807   10/08/17 1400  ceftazidime-avibactam (AVYCAZ) 2.5 g in dextrose 5 % 50 mL IVPB  Status:  Discontinued     2.5 g 25 mL/hr over 2 Hours Intravenous Every 8 hours 10/08/17 0948 10/09/17 1217   10/07/17 2200  linezolid (ZYVOX) IVPB 600 mg  Status:  Discontinued     600 mg 300 mL/hr over 60 Minutes Intravenous Every 12 hours 10/07/17 1609 10/07/17 1640   10/07/17 1800  ceftolozane-tazobactam (ZERBAXA) 750 mg in sodium chloride 0.9 % 100 mL IVPB  Status:  Discontinued     750 mg 105.7 mL/hr over 60 Minutes Intravenous Every 8 hours 10/07/17 1341 10/07/17 1706   10/07/17 1800   vancomycin (VANCOCIN) IVPB 750 mg/150 ml premix  Status:  Discontinued     750 mg 150 mL/hr over 60 Minutes Intravenous Every 12 hours 10/07/17 1640 10/08/17 0949   10/07/17 1800  ceftazidime-avibactam (AVYCAZ) 1.25 g in dextrose 5 % 50 mL IVPB  Status:  Discontinued     1.25 g 25 mL/hr over 2 Hours Intravenous Every 8 hours 10/07/17 1707 10/08/17 0948   10/07/17 1400  linezolid (ZYVOX) tablet 600 mg  Status:  Discontinued     600 mg Per Tube Every 12 hours 10/07/17 1341 10/07/17 1609   10/07/17 0515  vancomycin (VANCOCIN) 50 mg/mL oral solution 125 mg  Status:  Discontinued     125 mg Oral 4 times daily 10/07/17 0501 10/07/17 0844   10/07/17 0100  linezolid (ZYVOX) IVPB 600 mg     600 mg 300 mL/hr over 60 Minutes Intravenous  Once 10/07/17 0059 10/07/17 0220   10/07/17 0100  ceftolozane-tazobactam (ZERBAXA) 750 mg in sodium chloride 0.9 % 100 mL IVPB     750 mg 105.7 mL/hr over 60 Minutes Intravenous Every 8 hours 10/07/17 0059 10/07/17 1122      Medications: Scheduled Meds: . chlorhexidine gluconate (MEDLINE KIT)  15 mL Mouth Rinse BID  .  diltiazem  30 mg Oral Q6H  . feeding supplement (PRO-STAT SUGAR FREE 64)  30 mL Per Tube BID  . feeding supplement (VITAL HIGH PROTEIN)  1,000 mL Per Tube Q24H  . insulin aspart  0-9 Units Subcutaneous Q4H  . ipratropium  0.5 mg Nebulization Q6H  . levalbuterol  0.63 mg Nebulization Q6H  . levothyroxine  125 mcg Per Tube QAC breakfast  . mouth rinse  15 mL Mouth Rinse QID  . metoprolol tartrate  12.5 mg Per Tube BID  . pantoprazole sodium  40 mg Per Tube QHS   Continuous Infusions: . sodium chloride 250 mL (10/11/17 1800)  . sodium chloride    . sodium chloride    . dextrose 5 % and 0.9% NaCl 50 mL/hr (10/12/17 0026)  . ertapenem Stopped (10/11/17 1400)  . vancomycin 500 mg (10/12/17 0553)   PRN Meds:.sodium chloride, bisacodyl, fentaNYL (SUBLIMAZE) injection, fentaNYL (SUBLIMAZE) injection, sennosides  Objective: Weight change: -13.6  oz (-2.2 kg)  Intake/Output Summary (Last 24 hours) at 10/12/2017 1025 Last data filed at 10/12/2017 1000 Gross per 24 hour  Intake 1530 ml  Output 1840 ml  Net -310 ml   Blood pressure 125/77, pulse 90, temperature 99.6 F (37.6 C), temperature source Oral, resp. rate (!) 33, height _0  (1.778 m), weight 179 lb 14.3 oz (81.6 kg), SpO2 92 %. Temp:  [98 F (36.7 C)-101.5 F (38.6 C)] 99.6 F (37.6 C) (02/06 0849) Pulse Rate:  [48-144] 90 (02/06 1000) Resp:  [13-33] 33 (02/06 1000) BP: (100-125)/(47-77) 125/77 (02/06 1000) SpO2:  [91 %-100 %] 92 % (02/06 1000) FiO2 (%):  [30 %-40 %] 40 % (02/06 0800) Weight:  [179 lb 14.3 oz (81.6 kg)] 179 lb 14.3 oz (81.6 kg) (02/06 0448)  Physical Exam: General: Alert and awake, oriented x3, not in any acute distress. CVS: irregularly irregular, no M/R/G, radial and R DP pulses intact Chest: no increased work of breathing, CTAB Abdomen: soft, distended, +BS, nontender Extremities: s/p R AKA Skin: no rashes; warm and dry Neuro: paraplegic, able to move bil LEs with 2/5 strength  CBC: CBC Latest Ref Rng & Units 10/12/2017 10/11/2017 10/10/2017  WBC 4.0 - 10.5 K/uL 13.3(H) 16.0(H) 13.9(H)  Hemoglobin 13.0 - 17.0 g/dL 7.4(L) 7.5(L) 6.7(LL)  Hematocrit 39.0 - 52.0 % 24.8(L) 25.2(L) 22.7(L)  Platelets 150 - 400 K/uL 179 156 159    BMET Recent Labs    10/10/17 0324 10/11/17 0251  NA 143 147*  K 3.9 3.8  CL 105 107  CO2 25 28  GLUCOSE 103* 79  BUN 53* 43*  CREATININE 0.86 0.87  CALCIUM 8.1* 8.4*   Liver Panel  Recent Labs    10/10/17 1008  PROT 6.8  ALBUMIN 1.7*  AST 92*  ALT 144*  ALKPHOS 744*  BILITOT 1.1  BILIDIR 0.4  IBILI 0.7   Sedimentation Rate No results for input(s): ESRSEDRATE in the last 72 hours. C-Reactive Protein No results for input(s): CRP in the last 72 hours.  Micro Results: Recent Results (from the past 720 hour(s))  Urine Culture     Status: None   Collection Time: 09/18/17  3:37 AM  Result Value Ref  Range Status   Specimen Description URINE, RANDOM  Final   Special Requests NONE  Final   Culture NO GROWTH  Final   Report Status 09/19/2017 FINAL  Final  Blood culture (routine x 2)     Status: None   Collection Time: 09/18/17  3:58 AM  Result Value Ref  Range Status   Specimen Description BLOOD RIGHT HAND  Final   Special Requests   Final    BOTTLES DRAWN AEROBIC AND ANAEROBIC Blood Culture adequate volume   Culture NO GROWTH 5 DAYS  Final   Report Status 09/23/2017 FINAL  Final  Blood culture (routine x 2)     Status: None   Collection Time: 09/18/17  4:03 AM  Result Value Ref Range Status   Specimen Description BLOOD LEFT HAND  Final   Special Requests   Final    BOTTLES DRAWN AEROBIC AND ANAEROBIC Blood Culture adequate volume   Culture NO GROWTH 5 DAYS  Final   Report Status 09/23/2017 FINAL  Final  MRSA PCR Screening     Status: None   Collection Time: 09/18/17 12:20 PM  Result Value Ref Range Status   MRSA by PCR NEGATIVE NEGATIVE Final    Comment:        The GeneXpert MRSA Assay (FDA approved for NASAL specimens only), is one component of a comprehensive MRSA colonization surveillance program. It is not intended to diagnose MRSA infection nor to guide or monitor treatment for MRSA infections.   Culture, respiratory (NON-Expectorated)     Status: None   Collection Time: 09/19/17  2:11 PM  Result Value Ref Range Status   Specimen Description TRACHEAL ASPIRATE  Final   Special Requests NONE  Final   Gram Stain   Final    ABUNDANT WBC PRESENT,BOTH PMN AND MONONUCLEAR ABUNDANT GRAM NEGATIVE RODS MODERATE SQUAMOUS EPITHELIAL CELLS PRESENT    Culture MODERATE PSEUDOMONAS AERUGINOSA  Final   Report Status 09/22/2017 FINAL  Final   Organism ID, Bacteria PSEUDOMONAS AERUGINOSA  Final      Susceptibility   Pseudomonas aeruginosa - MIC*    CEFTAZIDIME 16 INTERMEDIATE Intermediate     CIPROFLOXACIN 1 SENSITIVE Sensitive     GENTAMICIN <=1 SENSITIVE Sensitive      IMIPENEM >=16 RESISTANT Resistant     CEFEPIME 16 INTERMEDIATE Intermediate     * MODERATE PSEUDOMONAS AERUGINOSA  Urine culture     Status: Abnormal   Collection Time: 10/06/17 10:43 PM  Result Value Ref Range Status   Specimen Description URINE, RANDOM  Final   Special Requests   Final    NONE Performed at [facility], [place] [address], [place], [place] [address]    Culture MULTIPLE SPECIES PRESENT, SUGGEST RECOLLECTION (A)  Final   Report Status 10/08/2017 FINAL  Final  Blood culture (routine x 2)     Status: Abnormal   Collection Time: 10/06/17 10:46 PM  Result Value Ref Range Status   Specimen Description BLOOD LEFT FOREARM  Final   Special Requests   Final    BOTTLES DRAWN AEROBIC AND ANAEROBIC Blood Culture adequate volume   Culture  Setup Time   Final    GRAM NEGATIVE RODS GRAM POSITIVE COCCI IN PAIRS IN CLUSTERS IN BOTH AEROBIC AND ANAEROBIC BOTTLES CRITICAL RESULT CALLED TO, READ BACK BY AND VERIFIED WITH: J MILLEN,PHARMD AT 1660 10/07/17 BY L BENFIELD Performed at [facility], [place] [address] [place], [place] [address]    Culture (A)  Final    METHICILLIN RESISTANT STAPHYLOCOCCUS AUREUS ESCHERICHIA COLI Confirmed Extended Spectrum Beta-Lactamase Producer (ESBL).  In bloodstream infections from ESBL organisms, carbapenems are preferred over piperacillin/tazobactam. They are shown to have a lower risk of mortality. PROTEUS MIRABILIS    Report Status 10/11/2017 FINAL  Final   Organism ID, Bacteria METHICILLIN RESISTANT STAPHYLOCOCCUS AUREUS  Final  Organism ID, Bacteria ESCHERICHIA COLI  Final   Organism ID, Bacteria PROTEUS MIRABILIS  Final      Susceptibility   Escherichia coli - MIC*    AMPICILLIN >=32 RESISTANT Resistant     CEFAZOLIN >=64 RESISTANT Resistant     CEFEPIME >=64 RESISTANT Resistant     CEFTAZIDIME RESISTANT Resistant     CEFTRIAXONE >=64 RESISTANT Resistant     CIPROFLOXACIN >=4 RESISTANT Resistant     GENTAMICIN <=1 SENSITIVE  Sensitive     IMIPENEM <=0.25 SENSITIVE Sensitive     TRIMETH/SULFA >=320 RESISTANT Resistant     AMPICILLIN/SULBACTAM >=32 RESISTANT Resistant     PIP/TAZO <=4 SENSITIVE Sensitive     Extended ESBL POSITIVE Resistant     * ESCHERICHIA COLI   Methicillin resistant staphylococcus aureus - MIC*    CIPROFLOXACIN >=8 RESISTANT Resistant     ERYTHROMYCIN >=8 RESISTANT Resistant     GENTAMICIN <=0.5 SENSITIVE Sensitive     OXACILLIN >=4 RESISTANT Resistant     TETRACYCLINE <=1 SENSITIVE Sensitive     VANCOMYCIN 1 SENSITIVE Sensitive     TRIMETH/SULFA <=10 SENSITIVE Sensitive     CLINDAMYCIN <=0.25 SENSITIVE Sensitive     RIFAMPIN <=0.5 SENSITIVE Sensitive     Inducible Clindamycin NEGATIVE Sensitive     * METHICILLIN RESISTANT STAPHYLOCOCCUS AUREUS   Proteus mirabilis - MIC*    AMPICILLIN <=2 SENSITIVE Sensitive     CEFAZOLIN <=4 SENSITIVE Sensitive     CEFEPIME <=1 SENSITIVE Sensitive     CEFTAZIDIME <=1 SENSITIVE Sensitive     CEFTRIAXONE <=1 SENSITIVE Sensitive     CIPROFLOXACIN >=4 RESISTANT Resistant     GENTAMICIN <=1 SENSITIVE Sensitive     IMIPENEM 4 SENSITIVE Sensitive     TRIMETH/SULFA <=20 SENSITIVE Sensitive     AMPICILLIN/SULBACTAM <=2 SENSITIVE Sensitive     PIP/TAZO <=4 SENSITIVE Sensitive     * PROTEUS MIRABILIS  Blood Culture ID Panel (Reflexed)     Status: Abnormal   Collection Time: 10/06/17 10:46 PM  Result Value Ref Range Status   Enterococcus species NOT DETECTED NOT DETECTED Final   Listeria monocytogenes NOT DETECTED NOT DETECTED Final   Staphylococcus species DETECTED (A) NOT DETECTED Final    Comment: CRITICAL RESULT CALLED TO, READ BACK BY AND VERIFIED WITH: J MILLEN,PHARMD AT 1534 10/08/17 BY L BENFIELD    Staphylococcus aureus DETECTED (A) NOT DETECTED Final    Comment: Methicillin (oxacillin)-resistant Staphylococcus aureus (MRSA). MRSA is predictably resistant to beta-lactam antibiotics (except ceftaroline). Preferred therapy is vancomycin unless  clinically contraindicated. Patient requires contact precautions if  hospitalized. CRITICAL RESULT CALLED TO, READ BACK BY AND VERIFIED WITH: J MILLEN,PHARMD AT 1534 10/08/17 BY L BENFIELD    Methicillin resistance DETECTED (A) NOT DETECTED Final    Comment: CRITICAL RESULT CALLED TO, READ BACK BY AND VERIFIED WITH: J MILLEN,PHARMD AT 1534 10/08/17 BY L BENFIELD    Streptococcus species NOT DETECTED NOT DETECTED Final   Streptococcus agalactiae NOT DETECTED NOT DETECTED Final   Streptococcus pneumoniae NOT DETECTED NOT DETECTED Final   Streptococcus pyogenes NOT DETECTED NOT DETECTED Final   Acinetobacter baumannii NOT DETECTED NOT DETECTED Final   Enterobacteriaceae species DETECTED (A) NOT DETECTED Final    Comment: CRITICAL RESULT CALLED TO, READ BACK BY AND VERIFIED WITH: J MILLEN,PHARMD AT 1534 10/08/17 BY L BENFIELD    Enterobacter cloacae complex NOT DETECTED NOT DETECTED Final   Escherichia coli DETECTED (A) NOT DETECTED Final    Comment: CRITICAL RESULT CALLED TO, READ BACK  BY AND VERIFIED WITH: J MILLEN,PHARMD AT 1534 10/08/17 BY L BENFIELD    Klebsiella oxytoca NOT DETECTED NOT DETECTED Final   Klebsiella pneumoniae NOT DETECTED NOT DETECTED Final   Proteus species DETECTED (A) NOT DETECTED Final    Comment: CRITICAL RESULT CALLED TO, READ BACK BY AND VERIFIED WITH: J MILLEN,PHARMD AT 1534 10/08/17 BY L BENFIELD    Serratia marcescens NOT DETECTED NOT DETECTED Final   Carbapenem resistance NOT DETECTED NOT DETECTED Final   Haemophilus influenzae NOT DETECTED NOT DETECTED Final   Neisseria meningitidis NOT DETECTED NOT DETECTED Final   Pseudomonas aeruginosa NOT DETECTED NOT DETECTED Final   Candida albicans NOT DETECTED NOT DETECTED Final   Candida glabrata NOT DETECTED NOT DETECTED Final   Candida krusei NOT DETECTED NOT DETECTED Final   Candida parapsilosis NOT DETECTED NOT DETECTED Final   Candida tropicalis NOT DETECTED NOT DETECTED Final    Comment: Performed at [facility], [place] [address] [place], [place] [address]  Blood culture (routine x 2)     Status: Abnormal   Collection Time: 10/06/17 11:00 PM  Result Value Ref Range Status   Specimen Description BLOOD RIGHT ARM  Final   Special Requests   Final    BOTTLES DRAWN AEROBIC AND ANAEROBIC Blood Culture adequate volume   Culture  Setup Time   Final    GRAM POSITIVE COCCI IN CLUSTERS IN BOTH AEROBIC AND ANAEROBIC BOTTLES CRITICAL VALUE NOTED.  VALUE IS CONSISTENT WITH PREVIOUSLY REPORTED AND CALLED VALUE.    Culture (A)  Final    STAPHYLOCOCCUS AUREUS SUSCEPTIBILITIES PERFORMED ON PREVIOUS CULTURE WITHIN THE LAST 5 DAYS. Performed at [facility], [place] [address], [place], [place] [address]    Report Status 10/09/2017 FINAL  Final  C difficile quick scan w PCR reflex     Status: None   Collection Time: 10/07/17  4:20 AM  Result Value Ref Range Status   C Diff antigen NEGATIVE NEGATIVE Final   C Diff toxin NEGATIVE NEGATIVE Final   C Diff interpretation No C. difficile detected.  Final  Culture, blood (Routine X 2) w Reflex to ID Panel     Status: None (Preliminary result)   Collection Time: 10/08/17 11:06 AM  Result Value Ref Range Status   Specimen Description BLOOD LEFT ANTECUBITAL  Final   Special Requests   Final    BOTTLES DRAWN AEROBIC AND ANAEROBIC Blood Culture adequate volume   Culture   Final    NO GROWTH 3 DAYS Performed at [facility], [address] [place], [place] [address]    Report Status PENDING  Incomplete    Studies/Results: No results found.  TEE 10/11/17: - No evidence of endocarditis.  Assessment/Plan:  INTERVAL HISTORY:  PICC was d/c'd 02/02 BCx 2/2 NGTD (prior to PICC removal) BCx 2/4 pending TF restarted  Active Problems:   AKI (acute kidney injury) ([facility])   Chronic respiratory failure (HCC)   Septic shock (HCC)   Chronic obstructive pulmonary disease (HCC)   History of Clostridium difficile infection   Pressure injury of skin    MRSA bacteremia   E coli bacteremia   Bacterial infection due to Proteus mirabilis   Infection of peripherally inserted central venous catheter (PICC)   History of MDR Pseudomonas aeruginosa infection   ESBL (extended spectrum beta-lactamase) producing bacteria infection   Craig Oneal. is a 67 y.o. male with paraplegia, COPD, PAF, HFrEF, chronic respiratory failure s/p trach and PEG with  multiple recent admissions for UTIs, MDR pseudomonas and Candidemia who presented to [facility] with septic shock, found to have MRSA and ESBL bacteremia. TEE w/o vegetations.  Plan: --as no other indication that fever is from new source and resolved w/o intervention will continue to monitor --continue vanc and invanz - appreciate pharmacy assistance with dosing; Vanc for at least 6 weeks, invanz 7-10 days post PICC removal (2/2) --f/u repeat blood cultures from 2/2, 2/4 (post PICC removal)   LOS: 5 days   [name]  PGY 2 Infectious Diseases [phone] 10/12/2017, 10:25 AM

## 2017-10-12 NOTE — Progress Notes (Signed)
Nutrition Consult / Follow-up  DOCUMENTATION CODES:   Not applicable  INTERVENTION:    Vital AF 1.2 at 75 ml/h (1800 ml per day)  Provides 2160 kcal, 135 gm protein, 1460 ml free water daily  NUTRITION DIAGNOSIS:   Inadequate oral intake related to inability to eat as evidenced by NPO status.  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  Discussed patient in ICU rounds and with RN today. Received MD Consult for TF initiation and management. TF has been on hold for the past 2 days due to abdominal distention. Belly is now soft and TF has been resumed. Hopeful for transfer back to [facility] soon. Patient is currently on trach collar. Labs and medications reviewed.  Diet Order:  Diet NPO time specified Except for: Sips with Meds  EDUCATION NEEDS:   No education needs have been identified at this time  Skin:  Skin Assessment: Skin Integrity Issues: Skin Integrity Issues:: Stage III, Stage IV, Unstageable Stage III: R ischial tuberosity Stage IV: R ischial tuberosity Unstageable: sacrum  Last BM:  2/6  Height:   Ht Readings from Last 1 Encounters:  10/06/17 5\' 10"  (1.778 m)    Weight:   Wt Readings from Last 1 Encounters:  10/12/17 179 lb 14.3 oz (81.6 kg)    Ideal Body Weight:  70.3 kg  BMI:  Body mass index is 25.81 kg/m.  Estimated Nutritional Needs:   Kcal:  2000-2200  Protein:  120-135 gm  Fluid:  >/= 2 L   Joaquin CourtsKimberly Eivin Mascio, RD, LDN, CNSC Pager [phone] After Hours Pager [phone]

## 2017-10-13 ENCOUNTER — Inpatient Hospital Stay (HOSPITAL_COMMUNITY): Payer: Medicare Other

## 2017-10-13 DIAGNOSIS — A4102 Sepsis due to Methicillin resistant Staphylococcus aureus: Secondary | ICD-10-CM | POA: Diagnosis not present

## 2017-10-13 DIAGNOSIS — I4891 Unspecified atrial fibrillation: Secondary | ICD-10-CM

## 2017-10-13 DIAGNOSIS — R748 Abnormal levels of other serum enzymes: Secondary | ICD-10-CM

## 2017-10-13 DIAGNOSIS — I480 Paroxysmal atrial fibrillation: Secondary | ICD-10-CM | POA: Diagnosis not present

## 2017-10-13 DIAGNOSIS — R7881 Bacteremia: Secondary | ICD-10-CM | POA: Diagnosis not present

## 2017-10-13 DIAGNOSIS — A419 Sepsis, unspecified organism: Secondary | ICD-10-CM | POA: Diagnosis not present

## 2017-10-13 DIAGNOSIS — J9611 Chronic respiratory failure with hypoxia: Secondary | ICD-10-CM | POA: Diagnosis not present

## 2017-10-13 DIAGNOSIS — D649 Anemia, unspecified: Secondary | ICD-10-CM | POA: Diagnosis not present

## 2017-10-13 DIAGNOSIS — R6521 Severe sepsis with septic shock: Secondary | ICD-10-CM | POA: Diagnosis not present

## 2017-10-13 DIAGNOSIS — G319 Degenerative disease of nervous system, unspecified: Secondary | ICD-10-CM | POA: Diagnosis not present

## 2017-10-13 DIAGNOSIS — R140 Abdominal distension (gaseous): Secondary | ICD-10-CM

## 2017-10-13 DIAGNOSIS — J449 Chronic obstructive pulmonary disease, unspecified: Secondary | ICD-10-CM | POA: Diagnosis not present

## 2017-10-13 LAB — URINALYSIS, ROUTINE W REFLEX MICROSCOPIC
BILIRUBIN URINE: NEGATIVE
Glucose, UA: NEGATIVE mg/dL
Ketones, ur: NEGATIVE mg/dL
Nitrite: NEGATIVE
PH: 5 (ref 5.0–8.0)
Protein, ur: 30 mg/dL — AB
SPECIFIC GRAVITY, URINE: 1.016 (ref 1.005–1.030)
SQUAMOUS EPITHELIAL / LPF: NONE SEEN

## 2017-10-13 LAB — CBC
HCT: 26.1 % — ABNORMAL LOW (ref 39.0–52.0)
Hemoglobin: 7.4 g/dL — ABNORMAL LOW (ref 13.0–17.0)
MCH: 23.3 pg — AB (ref 26.0–34.0)
MCHC: 28.4 g/dL — ABNORMAL LOW (ref 30.0–36.0)
MCV: 82.3 fL (ref 78.0–100.0)
PLATELETS: 164 10*3/uL (ref 150–400)
RBC: 3.17 MIL/uL — ABNORMAL LOW (ref 4.22–5.81)
RDW: 18.7 % — AB (ref 11.5–15.5)
WBC: 15.6 10*3/uL — AB (ref 4.0–10.5)

## 2017-10-13 LAB — GLUCOSE, CAPILLARY
GLUCOSE-CAPILLARY: 114 mg/dL — AB (ref 65–99)
GLUCOSE-CAPILLARY: 120 mg/dL — AB (ref 65–99)
GLUCOSE-CAPILLARY: 115 mg/dL — AB (ref 65–99)
GLUCOSE-CAPILLARY: 105 mg/dL — AB (ref 65–99)
Glucose-Capillary: 113 mg/dL — ABNORMAL HIGH (ref 65–99)
Glucose-Capillary: 119 mg/dL — ABNORMAL HIGH (ref 65–99)

## 2017-10-13 LAB — CULTURE, BLOOD (ROUTINE X 2)
CULTURE: NO GROWTH
Special Requests: ADEQUATE

## 2017-10-13 MED ORDER — ACETAMINOPHEN 160 MG/5ML PO SOLN
650.0000 mg | Freq: Four times a day (QID) | ORAL | Status: DC | PRN
  Administered 2017-10-13: 650 mg
  Filled 2017-10-13: qty 20.3

## 2017-10-13 NOTE — Progress Notes (Addendum)
CSW received call from [place]ennie at [facility] as well as from [place] Republic[place]aren at [facility]. Both informed CSW that they have received pt's referral at this time and are reviewing pt's need. CSW also was informed that per [facility] rep, pt should have a bed at [facility] tomorrow to return there. CSW has informed other facilities that pt is from [facility] and the goal is to get pt back there, however facilities are willing to see if they can still meet pt's needs in the event that pt cannot go to [facility] tomorrow. Facilities to contact CSW back with updated information as needed. CSW remains available for support at this time and plans to update daughter as CSW coordinates further discharge plans.     Claude MangesKierra S. Delando Satter, MSW, LCSW-A Emergency Department Clinical Social Worker [phone]

## 2017-10-13 NOTE — Progress Notes (Signed)
Pt taken off vent and placed back on trach collar at this time. Pt denies SOB, no increased wob, VS within normal limits.

## 2017-10-13 NOTE — Progress Notes (Addendum)
7:41am- CSW spoke with pt;s daughter to inform her that CSW has not heard anything from facilities at this time and that CSW would continue to reach out to [facility] for beds today. Pt's daughter expressed that she would also reach out to [facility] as she reports that this has never happened before. CSW also asked for further permission to send information to facilities in [place] and pt's daughter IS NOT agreeable to this as she reports that those facilities are 5 hours away from her and she wouldn't be able to visit pt as she would like. CSW continues to follow for needs at this time.   LATE ENTRY: CSW informed that pt is not appropriate for LTACH and [facility] still has no beds. CSW has spoken with pt's daughter Sunny SchleinFelicia and received permission to fax pt out to other facilities in [place]. CSW has faxed pt out at this time. CSW to follow up with [facility] today about beds.     Claude MangesKierra S. Keshara Kiger, MSW, LCSW-A Emergency Department Clinical Social Worker [phone]

## 2017-10-13 NOTE — Progress Notes (Signed)
CSW spoke with Misty StanleyStacey at [facility] and was informed that they still do not have beds today for pt. CSW was informed that [facility] is expecting a discharge tomorrow morning and they should be able to take pt tomorrow morning after 10:30am.    CSW has spoken with pt's daughter again and asked for permission to send pt's information to out of state facilities. Pt' daughter agreed to this. CSW has reached out to [place] GrantJenni from [facility] and left VM to follow up on referral for pt. CSW has also reached out to Ryland GroupJeanie Stewart from [facility] to see if they received referral on pt at this time. At this time CSW is awaiting call back from both parties. CSW remains available for support at this time.    Claude MangesKierra S. Edin Skarda, MSW, LCSW-A Emergency Department Clinical Social Worker [phone]

## 2017-10-13 NOTE — Progress Notes (Signed)
PROGRESS NOTE    Craig Oneal.  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 Blood cultures sent and started on cefepime 11/26 and amikacin 11/27.  Additionally, daughter reports patient normally requires ventilator at night normally but has required continously for the last two days.     Patient has been at [facility] since February 2018; prior to was at [facility].  Hospitalized at [facility] in 01/2017 with proteus bacteremia, septic and hemorrhagic shock.  Additionally has history of serratia and pseudomonas from tracheal aspirate in July, both sensitive to Cefepime, as well as enterobacter and morganella.  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 Patient was admitted to [facility] 11/26 2018 ---> 12/15/ 2018 for severe sepsis positive VRE UTI, Pseudomonas HCAP, CRE, fungemia, Acute on CKD stage III.    Admitted to [facility] (1/13 to 1/20) for sepsis secondary to multidrug resistant pneumonia culture positive for Pseudomonas, Moraxella, and Serratia and multidrug resistant E. coli urinary tract infection.  He was initially managed with meropenem and ciprofloxacin.  Infectious diseases was consulted and antibiotics were changed to ciprofloxacin via PEG. He was discharged to [facility] on this regimen until 1/22. Then 1/30 he developed fevers and was felt to possibly have developed pneumonia again. He was started on amikacin and levofloxacin for this. 1/31 fevers worsened up to 103F and he became  hypotensive, which persisted despite IVF bolus at [facility].    He was sent to [facility] ED on 1/31 for further evaluation where he remained hypotensive despite 3L IV fluid resuscitation and required vasopressor support.  Labs significant for WBC 13.6, Hgb 8.0, sCr 1.97, AST 91, normal lactic acid, and UA with moderate leukocytes with many bacteria, nitrite negative.  He was admitted to the ICU by PCCM service for septic shock and placed on vasopressor support.    He additionally required short term rate control with amiodarone drip and was placed on heparin gtt.  He was noted to have watery diarrhea and given his history of Cdiff and recent MDRO with heavy antibiotic use, he was empirically started on oral vancomycin until stool PCR was negative for Cdiff on 2/1.  Infectious disease was consulted again after blood cultures found to be positive for MRSA on ESBL bacteremia.  He remained on nocturnal ventilation with transition back to trach collar during the day on 2/2.  His PICC line was discontinued on 2/2 with repeat blood cultures drawn and repeated 2/4 still pending.  Heparin gtt was stopped on 2/4 secondary to anemia and required transfusion of 1 unit PRBC for Hgb of 6.7 without signs of bleeding.  His transaminitis, noted back to his prevous admission, thought due to shock and sepsis continues to slowly improve.  Meropenum was changed to Invanz by ID on 2/5. A transesophageal echocardiogram was performed on 2/5 in which was negative for endocarditis.  Infectious Disease recommendations given to continue invanz for 7-10 days following PICC removal and vancomycin for at least 4 weeks given no vegetations.       Subjective: 2/7 A/O 2 (does not  know where, when). Follows commands. Negative CP, negative SOB, negative abdominal pain.   Assessment & Plan:   Active Problems:   AKI (acute kidney injury) ([place])   Chronic respiratory failure (HCC)   Septic shock (HCC)   Chronic obstructive pulmonary disease  (HCC)   History of Clostridium difficile infection   Pressure injury of skin   MRSA bacteremia   E coli bacteremia   Bacterial infection due to Proteus mirabilis   Infection of peripherally inserted central venous catheter (PICC)   History of MDR Pseudomonas aeruginosa infection   ESBL (extended spectrum beta-lactamase) producing bacteria infection   Distended abdomen  Chronic respiratory failure with nocturnal vent Continue vent a night - PRVC 15/5/500/ 30% Trach collar daytime   COPD without exacerbation  Septic shock: Positive MRSA bacteremia - positive MRSA, Escherichia coli, Proteus Mirabilis anc/invanz per ID TEE neg for IE Continue PIV x 2 for line holiday Follow repeat BC 2/4, ngtd   AFib with RVR -Currently rate controlled ;   - TEE 2/6 negative for IE  Chronic PEG  Elevated Alk phos/ mild Transaminitis - improving   Anemia without bleeding - stable - near his baseline Hgb prior to admit    Hyperglycemia/Hypoglycemia - mild 2/6  Hypothyroidism   Cerebellar atrophy Hold sedation    DVT prophylaxis: SCD Code Status: Full Family Communication: None Disposition Plan: TBD   Consultants:  [facility]  Procedures/Significant Events:     I have personally reviewed and interpreted all radiology studies and my findings are as above.  VENTILATOR SETTINGS:    Cultures 1/31 urine positive multiple species 1/31 blood positive MRSA, Escherichia coli, Proteus Mirabilis 2/2 blood negative 2/4 blood NGTD 2/7 urine pending   Antimicrobials: Anti-infectives (From admission, onward)   Start     Stop   10/12/17 0500  vancomycin (VANCOCIN) 500 mg in sodium chloride 0.9 % 100 mL IVPB         10/11/17 1400  ertapenem (INVANZ) 1 g in sodium chloride 0.9 % 50 mL IVPB     10/18/17 2359   10/11/17 1245  ertapenem (INVANZ) injection 1 g  Status:  Discontinued     10/11/17 1247   10/10/17 0600  vancomycin (VANCOCIN) IVPB 750 mg/150 ml premix  Status:   Discontinued     10/11/17 1729   10/09/17 1400  meropenem (MERREM) 1 g in sodium chloride 0.9 % 100 mL IVPB  Status:  Discontinued     10/11/17 1244   10/08/17 1800  vancomycin (VANCOCIN) IVPB 1000 mg/200 mL premix  Status:  Discontinued     10/09/17 1807   10/08/17 1400  ceftazidime-avibactam (AVYCAZ) 2.5 g in dextrose 5 % 50 mL IVPB  Status:  Discontinued     10/09/17 1217   10/07/17 2200  linezolid (ZYVOX) IVPB 600 mg  Status:  Discontinued     10/07/17 1640   10/07/17 1800  ceftolozane-tazobactam (ZERBAXA) 750 mg in sodium chloride 0.9 % 100 mL IVPB  Status:  Discontinued     10/07/17 1706   10/07/17 1800  vancomycin (VANCOCIN) IVPB 750 mg/150 ml premix  Status:  Discontinued     10/08/17 0949   10/07/17 1800  ceftazidime-avibactam (AVYCAZ) 1.25 g in dextrose 5 % 50 mL IVPB  Status:  Discontinued     10/08/17 0948   10/07/17 1400  linezolid (ZYVOX) tablet 600 mg  Status:  Discontinued     10/07/17 1609   10/07/17 0515  vancomycin (VANCOCIN) 50 mg/mL oral solution 125 mg  Status:  Discontinued     10/07/17 0844   10/07/17 0100  linezolid (ZYVOX) IVPB 600 mg     10/07/17 0220   10/07/17 0100  ceftolozane-tazobactam (ZERBAXA) 750 mg in sodium chloride 0.9 % 100 mL IVPB     10/07/17 1122       Devices   LINES / TUBES:      Continuous Infusions: . sodium chloride 250 mL (10/11/17 1800)  . sodium chloride    . sodium chloride    . dextrose 5 % and 0.9% NaCl Stopped (10/12/17 1136)  . ertapenem 1 g (10/13/17 1310)  . feeding supplement (VITAL AF 1.2 CAL) 1,000 mL (10/13/17 0600)  . vancomycin Stopped (10/13/17 0503)     Objective: Vitals:   10/13/17 1100 10/13/17 1147 10/13/17 1200 10/13/17 1300  BP: (!) 120/57  118/64 (!) 98/53  Pulse: 84  84 87  Resp: (!) 26  (!) 29 (!) 27  Temp:  99 F (37.2 C)    TempSrc:  Oral    SpO2: 96%  98% 98%  Weight:      Height:        Intake/Output Summary (Last 24 hours) at 10/13/2017 1335 Last data filed at 10/13/2017 1200 Gross  per 24 hour  Intake 2645 ml  Output 1240 ml  Net 1405 ml   Filed Weights   10/11/17 0500 10/12/17 0448 10/13/17 0500  Weight: 184 lb 11.9 oz (83.8 kg) 179 lb 14.3 oz (81.6 kg) 179 lb 14.3 oz (81.6 kg)    Examination:  General: A/O 2, (does not aware, when), positive chronic respiratory distress Neck:  Negative scars, masses, torticollis, lymphadenopathy, JVD, #8 cuffed trach in place negative sign of infection Lungs: Clear to auscultation bilaterally without wheezes or crackles Cardiovascular: Irregular irregular rhythm and rate without murmur gallop or rub normal S1 and S2 Abdomen: negative abdominal pain, nondistended, positive soft, bowel sounds, no rebound, no ascites, no appreciable mass, PEG tube in place, negative sign of infection. Extremities: RIGHT AKA incision line well-healed negative sign of infection, left lower extremity negative pedal edema.  Skin: Negative rashes, lesions, ulcers Psychiatric:  Unable to fully evaluate  Central nervous system:  Cranial nerves II through XII intact, tongue/uvula midline, all extremities muscle strength 5/5, sensation intact throughout, positive dysarthria, negative expressive aphasia, negative receptive aphasia.  .     Data Reviewed: Care during the described time interval was provided by me .  I have reviewed this patient's available data, including medical history, events of note, physical examination, and all test results as part of my evaluation.   CBC: Recent Labs  Lab 10/06/17 2243  10/09/17 0615 10/10/17 0324 10/11/17 0251 10/12/17 0335 10/13/17 0229  WBC 13.6*   < > 14.4* 13.9* 16.0* 13.3* 15.6*  NEUTROABS 10.3*  --   --   --  12.8*  --   --   HGB 8.0*   < > 7.4* 6.7* 7.5* 7.4* 7.4*  HCT 27.4*   < > 24.3* 22.7* 25.2* 24.8* 26.1*  MCV 81.1   < > 79.7 79.6 79.2 80.5 82.3  PLT 242   < > 184 159 156 179 164   < > = values in this interval not displayed.   Basic Metabolic Panel: Recent Labs  Lab 10/08/17 0617  10/09/17 0615 10/10/17 0324 10/11/17 0251 10/12/17 1017  NA 139 139 143 147* 150*  K 3.3* 3.5 3.9 3.8 3.2*  CL 104 103 105 107 112*  CO2 _0 28  GLUCOSE 190* 132* 103* 79 110*  BUN 67* 59* 53* 43* 25*  CREATININE 1.17 0.99 0.86 0.87 0.80  CALCIUM 8.1* 8.2* 8.1* 8.4* 8.7*  MG 2.1  --   --   --  1.9  PHOS 1.8* 1.8*  --   --   --    GFR: Estimated Creatinine Clearance: 96.3 mL/min (by C-G formula based on SCr of 0.8 mg/dL). Liver Function Tests: Recent Labs  Lab 10/06/17 2243 10/09/17 0615 10/10/17 1008 10/12/17 1017  AST 91* 137* 92* 32  ALT 90* 160* 144* 74*  ALKPHOS 537* 736* 744* 642*  BILITOT 1.2 1.3* 1.1 1.0  PROT 7.4 6.9 6.8 7.2  ALBUMIN 2.0* 1.7* 1.7* 1.7*   No results for input(s): LIPASE, AMYLASE in the last 168 hours. No results for input(s): AMMONIA in the last 168 hours. Coagulation Profile: Recent Labs  Lab 10/07/17 0400  INR 1.45   Cardiac Enzymes: No results for input(s): CKTOTAL, CKMB, CKMBINDEX, TROPONINI in the last 168 hours. BNP (last 3 results) No results for input(s): PROBNP in the last 8760 hours. HbA1C: No results for input(s): HGBA1C in the last 72 hours. CBG: Recent Labs  Lab 10/12/17 1911 10/12/17 2330 10/13/17 0329 10/13/17 0724 10/13/17 1141  GLUCAP 107* 116* 114* 119* 120*   Lipid Profile: No results for input(s): CHOL, HDL, LDLCALC, TRIG, CHOLHDL, LDLDIRECT in the last 72 hours. Thyroid Function Tests: No results for input(s): TSH, T4TOTAL, FREET4, T3FREE, THYROIDAB in the last 72 hours. Anemia Panel: No results for input(s): VITAMINB12, FOLATE, FERRITIN, TIBC, IRON, RETICCTPCT in the last 72 hours. Urine analysis:    Component Value Date/Time   COLORURINE AMBER (A) 10/13/2017 1150   APPEARANCEUR CLOUDY (A) 10/13/2017 1150   LABSPEC 1.016 10/13/2017 1150   PHURINE 5.0 10/13/2017 1150   GLUCOSEU NEGATIVE 10/13/2017 1150   HGBUR SMALL (A) 10/13/2017 1150   BILIRUBINUR NEGATIVE 10/13/2017 1150   KETONESUR  NEGATIVE 10/13/2017 1150   PROTEINUR 30 (A) 10/13/2017 1150   NITRITE NEGATIVE 10/13/2017 1150   LEUKOCYTESUR MODERATE (A) 10/13/2017 1150   Sepsis Labs: _0 (procalcitonin:4,lacticidven:4)  ) Recent Results (from the past 240 hour(s))  Urine culture     Status: Abnormal   Collection Time: 10/06/17 10:43 PM  Result Value Ref Range Status   Specimen Description URINE, RANDOM  Final   Special Requests   Final    NONE Performed at [facility], [place] [address] [place], [place] [address]    Culture MULTIPLE SPECIES PRESENT, SUGGEST RECOLLECTION (A)  Final   Report Status 10/08/2017 FINAL  Final  Blood culture (routine x 2)     Status: Abnormal   Collection Time: 10/06/17 10:46 PM  Result Value Ref Range Status   Specimen Description BLOOD LEFT FOREARM  Final   Special Requests   Final    BOTTLES DRAWN AEROBIC AND ANAEROBIC Blood Culture adequate volume   Culture  Setup Time   Final    GRAM NEGATIVE RODS GRAM POSITIVE COCCI IN PAIRS IN CLUSTERS IN BOTH AEROBIC AND ANAEROBIC BOTTLES CRITICAL RESULT CALLED TO, READ BACK BY AND VERIFIED WITH: J MILLEN,PHARMD AT 2229 10/07/17 BY L BENFIELD Performed at [facility], [place] [address], [place], [place] [address]    Culture (A)  Final    METHICILLIN RESISTANT STAPHYLOCOCCUS AUREUS ESCHERICHIA COLI Confirmed Extended Spectrum Beta-Lactamase Producer (ESBL).  In bloodstream infections from ESBL organisms, carbapenems are preferred over piperacillin/tazobactam. They are shown to have a lower risk of mortality. PROTEUS MIRABILIS  Report Status 10/11/2017 FINAL  Final   Organism ID, Bacteria METHICILLIN RESISTANT STAPHYLOCOCCUS AUREUS  Final   Organism ID, Bacteria ESCHERICHIA COLI  Final   Organism ID, Bacteria PROTEUS MIRABILIS  Final      Susceptibility   Escherichia coli - MIC*    AMPICILLIN >=32 RESISTANT Resistant     CEFAZOLIN >=64 RESISTANT Resistant     CEFEPIME >=64 RESISTANT Resistant     CEFTAZIDIME  RESISTANT Resistant     CEFTRIAXONE >=64 RESISTANT Resistant     CIPROFLOXACIN >=4 RESISTANT Resistant     GENTAMICIN <=1 SENSITIVE Sensitive     IMIPENEM <=0.25 SENSITIVE Sensitive     TRIMETH/SULFA >=320 RESISTANT Resistant     AMPICILLIN/SULBACTAM >=32 RESISTANT Resistant     PIP/TAZO <=4 SENSITIVE Sensitive     Extended ESBL POSITIVE Resistant     * ESCHERICHIA COLI   Methicillin resistant staphylococcus aureus - MIC*    CIPROFLOXACIN >=8 RESISTANT Resistant     ERYTHROMYCIN >=8 RESISTANT Resistant     GENTAMICIN <=0.5 SENSITIVE Sensitive     OXACILLIN >=4 RESISTANT Resistant     TETRACYCLINE <=1 SENSITIVE Sensitive     VANCOMYCIN 1 SENSITIVE Sensitive     TRIMETH/SULFA <=10 SENSITIVE Sensitive     CLINDAMYCIN <=0.25 SENSITIVE Sensitive     RIFAMPIN <=0.5 SENSITIVE Sensitive     Inducible Clindamycin NEGATIVE Sensitive     * METHICILLIN RESISTANT STAPHYLOCOCCUS AUREUS   Proteus mirabilis - MIC*    AMPICILLIN <=2 SENSITIVE Sensitive     CEFAZOLIN <=4 SENSITIVE Sensitive     CEFEPIME <=1 SENSITIVE Sensitive     CEFTAZIDIME <=1 SENSITIVE Sensitive     CEFTRIAXONE <=1 SENSITIVE Sensitive     CIPROFLOXACIN >=4 RESISTANT Resistant     GENTAMICIN <=1 SENSITIVE Sensitive     IMIPENEM 4 SENSITIVE Sensitive     TRIMETH/SULFA <=20 SENSITIVE Sensitive     AMPICILLIN/SULBACTAM <=2 SENSITIVE Sensitive     PIP/TAZO <=4 SENSITIVE Sensitive     * PROTEUS MIRABILIS  Blood Culture ID Panel (Reflexed)     Status: Abnormal   Collection Time: 10/06/17 10:46 PM  Result Value Ref Range Status   Enterococcus species NOT DETECTED NOT DETECTED Final   Listeria monocytogenes NOT DETECTED NOT DETECTED Final   Staphylococcus species DETECTED (A) NOT DETECTED Final    Comment: CRITICAL RESULT CALLED TO, READ BACK BY AND VERIFIED WITH: J MILLEN,PHARMD AT 1534 10/08/17 BY L BENFIELD    Staphylococcus aureus DETECTED (A) NOT DETECTED Final    Comment: Methicillin (oxacillin)-resistant Staphylococcus  aureus (MRSA). MRSA is predictably resistant to beta-lactam antibiotics (except ceftaroline). Preferred therapy is vancomycin unless clinically contraindicated. Patient requires contact precautions if  hospitalized. CRITICAL RESULT CALLED TO, READ BACK BY AND VERIFIED WITH: J MILLEN,PHARMD AT 1534 10/08/17 BY L BENFIELD    Methicillin resistance DETECTED (A) NOT DETECTED Final    Comment: CRITICAL RESULT CALLED TO, READ BACK BY AND VERIFIED WITH: J MILLEN,PHARMD AT 1534 10/08/17 BY L BENFIELD    Streptococcus species NOT DETECTED NOT DETECTED Final   Streptococcus agalactiae NOT DETECTED NOT DETECTED Final   Streptococcus pneumoniae NOT DETECTED NOT DETECTED Final   Streptococcus pyogenes NOT DETECTED NOT DETECTED Final   Acinetobacter baumannii NOT DETECTED NOT DETECTED Final   Enterobacteriaceae species DETECTED (A) NOT DETECTED Final    Comment: CRITICAL RESULT CALLED TO, READ BACK BY AND VERIFIED WITH: J MILLEN,PHARMD AT 1534 10/08/17 BY L BENFIELD    Enterobacter cloacae complex NOT DETECTED NOT DETECTED Final  Escherichia coli DETECTED (A) NOT DETECTED Final    Comment: CRITICAL RESULT CALLED TO, READ BACK BY AND VERIFIED WITH: J MILLEN,PHARMD AT 1534 10/08/17 BY L BENFIELD    Klebsiella oxytoca NOT DETECTED NOT DETECTED Final   Klebsiella pneumoniae NOT DETECTED NOT DETECTED Final   Proteus species DETECTED (A) NOT DETECTED Final    Comment: CRITICAL RESULT CALLED TO, READ BACK BY AND VERIFIED WITH: J MILLEN,PHARMD AT 1534 10/08/17 BY L BENFIELD    Serratia marcescens NOT DETECTED NOT DETECTED Final   Carbapenem resistance NOT DETECTED NOT DETECTED Final   Haemophilus influenzae NOT DETECTED NOT DETECTED Final   Neisseria meningitidis NOT DETECTED NOT DETECTED Final   Pseudomonas aeruginosa NOT DETECTED NOT DETECTED Final   Candida albicans NOT DETECTED NOT DETECTED Final   Candida glabrata NOT DETECTED NOT DETECTED Final   Candida krusei NOT DETECTED NOT DETECTED Final   Candida  parapsilosis NOT DETECTED NOT DETECTED Final   Candida tropicalis NOT DETECTED NOT DETECTED Final    Comment: Performed at [facility], [place] [address], [place], [place] [address]  Blood culture (routine x 2)     Status: Abnormal   Collection Time: 10/06/17 11:00 PM  Result Value Ref Range Status   Specimen Description BLOOD RIGHT ARM  Final   Special Requests   Final    BOTTLES DRAWN AEROBIC AND ANAEROBIC Blood Culture adequate volume   Culture  Setup Time   Final    GRAM POSITIVE COCCI IN CLUSTERS IN BOTH AEROBIC AND ANAEROBIC BOTTLES CRITICAL VALUE NOTED.  VALUE IS CONSISTENT WITH PREVIOUSLY REPORTED AND CALLED VALUE.    Culture (A)  Final    STAPHYLOCOCCUS AUREUS SUSCEPTIBILITIES PERFORMED ON PREVIOUS CULTURE WITHIN THE LAST 5 DAYS. Performed at [facility], [place] [address] [place], [place] [address]    Report Status 10/09/2017 FINAL  Final  C difficile quick scan w PCR reflex     Status: None   Collection Time: 10/07/17  4:20 AM  Result Value Ref Range Status   C Diff antigen NEGATIVE NEGATIVE Final   C Diff toxin NEGATIVE NEGATIVE Final   C Diff interpretation No C. difficile detected.  Final  Culture, blood (Routine X 2) w Reflex to ID Panel     Status: None (Preliminary result)   Collection Time: 10/08/17 11:06 AM  Result Value Ref Range Status   Specimen Description BLOOD LEFT ANTECUBITAL  Final   Special Requests   Final    BOTTLES DRAWN AEROBIC AND ANAEROBIC Blood Culture adequate volume   Culture   Final    NO GROWTH 4 DAYS Performed at [facility], [address], [place], [place] [address]    Report Status PENDING  Incomplete  Culture, blood (Routine X 2) w Reflex to ID Panel     Status: None (Preliminary result)   Collection Time: 10/10/17  8:39 PM  Result Value Ref Range Status   Specimen Description BLOOD RIGHT HAND  Final   Special Requests IN PEDIATRIC BOTTLE Blood Culture adequate volume  Final   Culture   Final    NO GROWTH 1  DAY Performed at [facility], [place] [address], [place], [place] [address]    Report Status PENDING  Incomplete  Culture, blood (Routine X 2) w Reflex to ID Panel     Status: None (Preliminary result)   Collection Time: 10/10/17  8:42 PM  Result Value Ref Range Status   Specimen Description BLOOD RIGHT HAND  Final   Special Requests IN PEDIATRIC BOTTLE Blood Culture adequate volume  Final   Culture   Final    NO GROWTH 1 DAY Performed at [facility], [place] [address] [place], [place] [address]    Report Status PENDING  Incomplete         Radiology Studies: Dg Chest Port 1 View  Result Date: 10/13/2017 CLINICAL DATA:  Encounter for fever and pneumonia EXAM: PORTABLE CHEST 1 VIEW COMPARISON:  10/09/2017 FINDINGS: Tracheostomy tube in good position. Stable enlarged cardiac silhouette. There is dense LEFT basilar opacity. Fine interstitial pattern unchanged. No pneumothorax. IMPRESSION: 1. No significant change. 2. Dense LEFT lower lobe atelectasis versus infiltrate. 3. Fine interstitial edema. Electronically Signed   By: Suzy Bouchard M.D.   On: 10/13/2017 09:34        Scheduled Meds: . chlorhexidine gluconate (MEDLINE KIT)  15 mL Mouth Rinse BID  . diltiazem  30 mg Oral Q6H  . free water  200 mL Per Tube Q8H  . insulin aspart  0-9 Units Subcutaneous Q4H  . ipratropium  0.5 mg Nebulization Q6H  . levalbuterol  0.63 mg Nebulization Q6H  . levothyroxine  125 mcg Per Tube QAC breakfast  . mouth rinse  15 mL Mouth Rinse QID  . metoprolol tartrate  12.5 mg Per Tube BID  . pantoprazole sodium  40 mg Per Tube QHS   Continuous Infusions: . sodium chloride 250 mL (10/11/17 1800)  . sodium chloride    . sodium chloride    . dextrose 5 % and 0.9% NaCl Stopped (10/12/17 1136)  . ertapenem 1 g (10/13/17 1310)  . feeding supplement (VITAL AF 1.2 CAL) 1,000 mL (10/13/17 0600)  . vancomycin Stopped (10/13/17 0503)     LOS: 6 days    Time spent: 40  minutes    , Geraldo Docker, MD [facility] Pager [phone]   If 7PM-7AM, please contact night-coverage [email or web address] Password [email or web address] 10/13/2017, 1:35 PM

## 2017-10-13 NOTE — Progress Notes (Signed)
Subjective: Patient denies subjective fevers or chills, chest pain, shortness of breath, abd pain, N/V. Patient spiked another fever last night to 102.69F.  Antibiotics:  Anti-infectives (From admission, onward)   Start     Dose/Rate Route Frequency Ordered Stop   10/12/17 0500  vancomycin (VANCOCIN) 500 mg in sodium chloride 0.9 % 100 mL IVPB     500 mg 100 mL/hr over 60 Minutes Intravenous Every 12 hours 10/11/17 1738     10/11/17 1400  ertapenem (INVANZ) 1 g in sodium chloride 0.9 % 50 mL IVPB     1 g 100 mL/hr over 30 Minutes Intravenous Every 24 hours 10/11/17 1247 10/18/17 2359   10/11/17 1245  ertapenem (INVANZ) injection 1 g  Status:  Discontinued     1 g Intramuscular Every 24 hours 10/11/17 1244 10/11/17 1247   10/10/17 0600  vancomycin (VANCOCIN) IVPB 750 mg/150 ml premix  Status:  Discontinued     750 mg 150 mL/hr over 60 Minutes Intravenous Every 12 hours 10/09/17 1807 10/11/17 1729   10/09/17 1400  meropenem (MERREM) 1 g in sodium chloride 0.9 % 100 mL IVPB  Status:  Discontinued     1 g 200 mL/hr over 30 Minutes Intravenous Every 8 hours 10/09/17 1221 10/11/17 1244   10/08/17 1800  vancomycin (VANCOCIN) IVPB 1000 mg/200 mL premix  Status:  Discontinued     1,000 mg 200 mL/hr over 60 Minutes Intravenous Every 12 hours 10/08/17 0949 10/09/17 1807   10/08/17 1400  ceftazidime-avibactam (AVYCAZ) 2.5 g in dextrose 5 % 50 mL IVPB  Status:  Discontinued     2.5 g 25 mL/hr over 2 Hours Intravenous Every 8 hours 10/08/17 0948 10/09/17 1217   10/07/17 2200  linezolid (ZYVOX) IVPB 600 mg  Status:  Discontinued     600 mg 300 mL/hr over 60 Minutes Intravenous Every 12 hours 10/07/17 1609 10/07/17 1640   10/07/17 1800  ceftolozane-tazobactam (ZERBAXA) 750 mg in sodium chloride 0.9 % 100 mL IVPB  Status:  Discontinued     750 mg 105.7 mL/hr over 60 Minutes Intravenous Every 8 hours 10/07/17 1341 10/07/17 1706   10/07/17 1800  vancomycin (VANCOCIN) IVPB 750 mg/150 ml  premix  Status:  Discontinued     750 mg 150 mL/hr over 60 Minutes Intravenous Every 12 hours 10/07/17 1640 10/08/17 0949   10/07/17 1800  ceftazidime-avibactam (AVYCAZ) 1.25 g in dextrose 5 % 50 mL IVPB  Status:  Discontinued     1.25 g 25 mL/hr over 2 Hours Intravenous Every 8 hours 10/07/17 1707 10/08/17 0948   10/07/17 1400  linezolid (ZYVOX) tablet 600 mg  Status:  Discontinued     600 mg Per Tube Every 12 hours 10/07/17 1341 10/07/17 1609   10/07/17 0515  vancomycin (VANCOCIN) 50 mg/mL oral solution 125 mg  Status:  Discontinued     125 mg Oral 4 times daily 10/07/17 0501 10/07/17 0844   10/07/17 0100  linezolid (ZYVOX) IVPB 600 mg     600 mg 300 mL/hr over 60 Minutes Intravenous  Once 10/07/17 0059 10/07/17 0220   10/07/17 0100  ceftolozane-tazobactam (ZERBAXA) 750 mg in sodium chloride 0.9 % 100 mL IVPB     750 mg 105.7 mL/hr over 60 Minutes Intravenous Every 8 hours 10/07/17 0059 10/07/17 1122      Medications: Scheduled Meds: . chlorhexidine gluconate (MEDLINE KIT)  15 mL Mouth Rinse BID  . diltiazem  30 mg Oral Q6H  . free water  200 mL Per Tube Q8H  . insulin aspart  0-9 Units Subcutaneous Q4H  . ipratropium  0.5 mg Nebulization Q6H  . levalbuterol  0.63 mg Nebulization Q6H  . levothyroxine  125 mcg Per Tube QAC breakfast  . mouth rinse  15 mL Mouth Rinse QID  . metoprolol tartrate  12.5 mg Per Tube BID  . pantoprazole sodium  40 mg Per Tube QHS   Continuous Infusions: . sodium chloride 250 mL (10/11/17 1800)  . sodium chloride    . sodium chloride    . dextrose 5 % and 0.9% NaCl Stopped (10/12/17 1136)  . ertapenem Stopped (10/12/17 1347)  . feeding supplement (VITAL AF 1.2 CAL) 1,000 mL (10/13/17 0600)  . vancomycin Stopped (10/13/17 0503)   PRN Meds:.sodium chloride, acetaminophen (TYLENOL) oral liquid 160 mg/5 mL, bisacodyl, fentaNYL (SUBLIMAZE) injection, fentaNYL (SUBLIMAZE) injection, sennosides  Objective: Weight change: 0 lb (0 kg)  Intake/Output  Summary (Last 24 hours) at 10/13/2017 1129 Last data filed at 10/13/2017 1000 Gross per 24 hour  Intake 2767.5 ml  Output 1215 ml  Net 1552.5 ml   Blood pressure (!) 120/57, pulse 84, temperature (!) 100.5 F (38.1 C), temperature source Oral, resp. rate (!) 26, height 5' 10"  (1.778 m), weight 179 lb 14.3 oz (81.6 kg), SpO2 96 %. Temp:  [99.2 F (37.3 C)-102.9 F (39.4 C)] 100.5 F (38.1 C) (02/07 0728) Pulse Rate:  [61-178] 84 (02/07 1100) Resp:  [13-37] 26 (02/07 1100) BP: (98-130)/(47-72) 120/57 (02/07 1100) SpO2:  [92 %-100 %] 96 % (02/07 1100) FiO2 (%):  [30 %-40 %] 40 % (02/07 1057) Weight:  [179 lb 14.3 oz (81.6 kg)] 179 lb 14.3 oz (81.6 kg) (02/07 0500)  Physical Exam: General: Alert and awake, oriented x3, not in any acute distress. CVS: irregularly irregular, no M/R/G, radial and R DP pulses intact Chest: no increased work of breathing, CTAB Abdomen: soft, distended, +BS, nontender Extremities: s/p R AKA Skin: no rashes; warm and dry Neuro: paraplegic; nonfocal  CBC: CBC Latest Ref Rng & Units 10/13/2017 10/12/2017 10/11/2017  WBC 4.0 - 10.5 K/uL 15.6(H) 13.3(H) 16.0(H)  Hemoglobin 13.0 - 17.0 g/dL 7.4(L) 7.4(L) 7.5(L)  Hematocrit 39.0 - 52.0 % 26.1(L) 24.8(L) 25.2(L)  Platelets 150 - 400 K/uL 164 179 156    BMET Recent Labs    10/11/17 0251 10/12/17 1017  NA 147* 150*  K 3.8 3.2*  CL 107 112*  CO2 28 28  GLUCOSE 79 110*  BUN 43* 25*  CREATININE 0.87 0.80  CALCIUM 8.4* 8.7*   Liver Panel  Recent Labs    10/12/17 1017  PROT 7.2  ALBUMIN 1.7*  AST 32  ALT 74*  ALKPHOS 642*  BILITOT 1.0   Sedimentation Rate No results for input(s): ESRSEDRATE in the last 72 hours. C-Reactive Protein No results for input(s): CRP in the last 72 hours.  Micro Results: Recent Results (from the past 720 hour(s))  Urine Culture     Status: None   Collection Time: 09/18/17  3:37 AM  Result Value Ref Range Status   Specimen Description URINE, RANDOM  Final   Special  Requests NONE  Final   Culture NO GROWTH  Final   Report Status 09/19/2017 FINAL  Final  Blood culture (routine x 2)     Status: None   Collection Time: 09/18/17  3:58 AM  Result Value Ref Range Status   Specimen Description BLOOD RIGHT HAND  Final   Special Requests   Final    BOTTLES  DRAWN AEROBIC AND ANAEROBIC Blood Culture adequate volume   Culture NO GROWTH 5 DAYS  Final   Report Status 09/23/2017 FINAL  Final  Blood culture (routine x 2)     Status: None   Collection Time: 09/18/17  4:03 AM  Result Value Ref Range Status   Specimen Description BLOOD LEFT HAND  Final   Special Requests   Final    BOTTLES DRAWN AEROBIC AND ANAEROBIC Blood Culture adequate volume   Culture NO GROWTH 5 DAYS  Final   Report Status 09/23/2017 FINAL  Final  MRSA PCR Screening     Status: None   Collection Time: 09/18/17 12:20 PM  Result Value Ref Range Status   MRSA by PCR NEGATIVE NEGATIVE Final    Comment:        The GeneXpert MRSA Assay (FDA approved for NASAL specimens only), is one component of a comprehensive MRSA colonization surveillance program. It is not intended to diagnose MRSA infection nor to guide or monitor treatment for MRSA infections.   Culture, respiratory (NON-Expectorated)     Status: None   Collection Time: 09/19/17  2:11 PM  Result Value Ref Range Status   Specimen Description TRACHEAL ASPIRATE  Final   Special Requests NONE  Final   Gram Stain   Final    ABUNDANT WBC PRESENT,BOTH PMN AND MONONUCLEAR ABUNDANT GRAM NEGATIVE RODS MODERATE SQUAMOUS EPITHELIAL CELLS PRESENT    Culture MODERATE PSEUDOMONAS AERUGINOSA  Final   Report Status 09/22/2017 FINAL  Final   Organism ID, Bacteria PSEUDOMONAS AERUGINOSA  Final      Susceptibility   Pseudomonas aeruginosa - MIC*    CEFTAZIDIME 16 INTERMEDIATE Intermediate     CIPROFLOXACIN 1 SENSITIVE Sensitive     GENTAMICIN <=1 SENSITIVE Sensitive     IMIPENEM >=16 RESISTANT Resistant     CEFEPIME 16 INTERMEDIATE  Intermediate     * MODERATE PSEUDOMONAS AERUGINOSA  Urine culture     Status: Abnormal   Collection Time: 10/06/17 10:43 PM  Result Value Ref Range Status   Specimen Description URINE, RANDOM  Final   Special Requests   Final    NONE Performed at [facility], [place] [address] [place], [place] [address]    Culture MULTIPLE SPECIES PRESENT, SUGGEST RECOLLECTION (A)  Final   Report Status 10/08/2017 FINAL  Final  Blood culture (routine x 2)     Status: Abnormal   Collection Time: 10/06/17 10:46 PM  Result Value Ref Range Status   Specimen Description BLOOD LEFT FOREARM  Final   Special Requests   Final    BOTTLES DRAWN AEROBIC AND ANAEROBIC Blood Culture adequate volume   Culture  Setup Time   Final    GRAM NEGATIVE RODS GRAM POSITIVE COCCI IN PAIRS IN CLUSTERS IN BOTH AEROBIC AND ANAEROBIC BOTTLES CRITICAL RESULT CALLED TO, READ BACK BY AND VERIFIED WITH: J MILLEN,PHARMD AT 6256 10/07/17 BY L BENFIELD Performed at [facility], [place] [address], [place], [place] [address]    Culture (A)  Final    METHICILLIN RESISTANT STAPHYLOCOCCUS AUREUS ESCHERICHIA COLI Confirmed Extended Spectrum Beta-Lactamase Producer (ESBL).  In bloodstream infections from ESBL organisms, carbapenems are preferred over piperacillin/tazobactam. They are shown to have a lower risk of mortality. PROTEUS MIRABILIS    Report Status 10/11/2017 FINAL  Final   Organism ID, Bacteria METHICILLIN RESISTANT STAPHYLOCOCCUS AUREUS  Final   Organism ID, Bacteria ESCHERICHIA COLI  Final   Organism ID, Bacteria PROTEUS MIRABILIS  Final  Susceptibility   Escherichia coli - MIC*    AMPICILLIN >=32 RESISTANT Resistant     CEFAZOLIN >=64 RESISTANT Resistant     CEFEPIME >=64 RESISTANT Resistant     CEFTAZIDIME RESISTANT Resistant     CEFTRIAXONE >=64 RESISTANT Resistant     CIPROFLOXACIN >=4 RESISTANT Resistant     GENTAMICIN <=1 SENSITIVE Sensitive     IMIPENEM <=0.25 SENSITIVE Sensitive     TRIMETH/SULFA  >=320 RESISTANT Resistant     AMPICILLIN/SULBACTAM >=32 RESISTANT Resistant     PIP/TAZO <=4 SENSITIVE Sensitive     Extended ESBL POSITIVE Resistant     * ESCHERICHIA COLI   Methicillin resistant staphylococcus aureus - MIC*    CIPROFLOXACIN >=8 RESISTANT Resistant     ERYTHROMYCIN >=8 RESISTANT Resistant     GENTAMICIN <=0.5 SENSITIVE Sensitive     OXACILLIN >=4 RESISTANT Resistant     TETRACYCLINE <=1 SENSITIVE Sensitive     VANCOMYCIN 1 SENSITIVE Sensitive     TRIMETH/SULFA <=10 SENSITIVE Sensitive     CLINDAMYCIN <=0.25 SENSITIVE Sensitive     RIFAMPIN <=0.5 SENSITIVE Sensitive     Inducible Clindamycin NEGATIVE Sensitive     * METHICILLIN RESISTANT STAPHYLOCOCCUS AUREUS   Proteus mirabilis - MIC*    AMPICILLIN <=2 SENSITIVE Sensitive     CEFAZOLIN <=4 SENSITIVE Sensitive     CEFEPIME <=1 SENSITIVE Sensitive     CEFTAZIDIME <=1 SENSITIVE Sensitive     CEFTRIAXONE <=1 SENSITIVE Sensitive     CIPROFLOXACIN >=4 RESISTANT Resistant     GENTAMICIN <=1 SENSITIVE Sensitive     IMIPENEM 4 SENSITIVE Sensitive     TRIMETH/SULFA <=20 SENSITIVE Sensitive     AMPICILLIN/SULBACTAM <=2 SENSITIVE Sensitive     PIP/TAZO <=4 SENSITIVE Sensitive     * PROTEUS MIRABILIS  Blood Culture ID Panel (Reflexed)     Status: Abnormal   Collection Time: 10/06/17 10:46 PM  Result Value Ref Range Status   Enterococcus species NOT DETECTED NOT DETECTED Final   Listeria monocytogenes NOT DETECTED NOT DETECTED Final   Staphylococcus species DETECTED (A) NOT DETECTED Final    Comment: CRITICAL RESULT CALLED TO, READ BACK BY AND VERIFIED WITH: J MILLEN,PHARMD AT 1534 10/08/17 BY L BENFIELD    Staphylococcus aureus DETECTED (A) NOT DETECTED Final    Comment: Methicillin (oxacillin)-resistant Staphylococcus aureus (MRSA). MRSA is predictably resistant to beta-lactam antibiotics (except ceftaroline). Preferred therapy is vancomycin unless clinically contraindicated. Patient requires contact precautions if    hospitalized. CRITICAL RESULT CALLED TO, READ BACK BY AND VERIFIED WITH: J MILLEN,PHARMD AT 1534 10/08/17 BY L BENFIELD    Methicillin resistance DETECTED (A) NOT DETECTED Final    Comment: CRITICAL RESULT CALLED TO, READ BACK BY AND VERIFIED WITH: J MILLEN,PHARMD AT 1534 10/08/17 BY L BENFIELD    Streptococcus species NOT DETECTED NOT DETECTED Final   Streptococcus agalactiae NOT DETECTED NOT DETECTED Final   Streptococcus pneumoniae NOT DETECTED NOT DETECTED Final   Streptococcus pyogenes NOT DETECTED NOT DETECTED Final   Acinetobacter baumannii NOT DETECTED NOT DETECTED Final   Enterobacteriaceae species DETECTED (A) NOT DETECTED Final    Comment: CRITICAL RESULT CALLED TO, READ BACK BY AND VERIFIED WITH: J MILLEN,PHARMD AT 1534 10/08/17 BY L BENFIELD    Enterobacter cloacae complex NOT DETECTED NOT DETECTED Final   Escherichia coli DETECTED (A) NOT DETECTED Final    Comment: CRITICAL RESULT CALLED TO, READ BACK BY AND VERIFIED WITH: J MILLEN,PHARMD AT 1534 10/08/17 BY L BENFIELD    Klebsiella oxytoca NOT DETECTED NOT  DETECTED Final   Klebsiella pneumoniae NOT DETECTED NOT DETECTED Final   Proteus species DETECTED (A) NOT DETECTED Final    Comment: CRITICAL RESULT CALLED TO, READ BACK BY AND VERIFIED WITH: J MILLEN,PHARMD AT 1534 10/08/17 BY L BENFIELD    Serratia marcescens NOT DETECTED NOT DETECTED Final   Carbapenem resistance NOT DETECTED NOT DETECTED Final   Haemophilus influenzae NOT DETECTED NOT DETECTED Final   Neisseria meningitidis NOT DETECTED NOT DETECTED Final   Pseudomonas aeruginosa NOT DETECTED NOT DETECTED Final   Candida albicans NOT DETECTED NOT DETECTED Final   Candida glabrata NOT DETECTED NOT DETECTED Final   Candida krusei NOT DETECTED NOT DETECTED Final   Candida parapsilosis NOT DETECTED NOT DETECTED Final   Candida tropicalis NOT DETECTED NOT DETECTED Final    Comment: Performed at [facility], [place] [address], [place], [place] [address]  Blood  culture (routine x 2)     Status: Abnormal   Collection Time: 10/06/17 11:00 PM  Result Value Ref Range Status   Specimen Description BLOOD RIGHT ARM  Final   Special Requests   Final    BOTTLES DRAWN AEROBIC AND ANAEROBIC Blood Culture adequate volume   Culture  Setup Time   Final    GRAM POSITIVE COCCI IN CLUSTERS IN BOTH AEROBIC AND ANAEROBIC BOTTLES CRITICAL VALUE NOTED.  VALUE IS CONSISTENT WITH PREVIOUSLY REPORTED AND CALLED VALUE.    Culture (A)  Final    STAPHYLOCOCCUS AUREUS SUSCEPTIBILITIES PERFORMED ON PREVIOUS CULTURE WITHIN THE LAST 5 DAYS. Performed at [facility], [place] [address], [place], [place] [address]    Report Status 10/09/2017 FINAL  Final  C difficile quick scan w PCR reflex     Status: None   Collection Time: 10/07/17  4:20 AM  Result Value Ref Range Status   C Diff antigen NEGATIVE NEGATIVE Final   C Diff toxin NEGATIVE NEGATIVE Final   C Diff interpretation No C. difficile detected.  Final  Culture, blood (Routine X 2) w Reflex to ID Panel     Status: None (Preliminary result)   Collection Time: 10/08/17 11:06 AM  Result Value Ref Range Status   Specimen Description BLOOD LEFT ANTECUBITAL  Final   Special Requests   Final    BOTTLES DRAWN AEROBIC AND ANAEROBIC Blood Culture adequate volume   Culture   Final    NO GROWTH 4 DAYS Performed at [facility], [address] [place], [place] [address]    Report Status PENDING  Incomplete  Culture, blood (Routine X 2) w Reflex to ID Panel     Status: None (Preliminary result)   Collection Time: 10/10/17  8:39 PM  Result Value Ref Range Status   Specimen Description BLOOD RIGHT HAND  Final   Special Requests IN PEDIATRIC BOTTLE Blood Culture adequate volume  Final   Culture   Final    NO GROWTH 1 DAY Performed at [facility], [place] [address] [place], [place] [address]    Report Status PENDING  Incomplete  Culture, blood (Routine X 2) w Reflex to ID Panel     Status: None (Preliminary  result)   Collection Time: 10/10/17  8:42 PM  Result Value Ref Range Status   Specimen Description BLOOD RIGHT HAND  Final   Special Requests IN PEDIATRIC BOTTLE Blood Culture adequate volume  Final   Culture   Final    NO GROWTH 1 DAY Performed at [facility], [place]  [address], [place], [place] [address]    Report Status PENDING  Incomplete    Studies/Results: Dg Chest Port 1 View  Result Date: 10/13/2017 CLINICAL DATA:  Encounter for fever and pneumonia EXAM: PORTABLE CHEST 1 VIEW COMPARISON:  10/09/2017 FINDINGS: Tracheostomy tube in good position. Stable enlarged cardiac silhouette. There is dense LEFT basilar opacity. Fine interstitial pattern unchanged. No pneumothorax. IMPRESSION: 1. No significant change. 2. Dense LEFT lower lobe atelectasis versus infiltrate. 3. Fine interstitial edema. Electronically Signed   By: Suzy Bouchard M.D.   On: 10/13/2017 09:34    TEE 10/11/17: - No evidence of endocarditis.  Assessment/Plan:  INTERVAL HISTORY:  PICC was d/c'd 02/02 BCx 2/2 NGTD (prior to PICC removal) BCx 2/4 NGTD (post PICC removal)  Active Problems:   AKI (acute kidney injury) ([place])   Chronic respiratory failure ([facility])   Septic shock ([facility])   Chronic obstructive pulmonary disease ([place])   History of Clostridium difficile infection   Pressure injury of skin   MRSA bacteremia   E coli bacteremia   Bacterial infection due to Proteus mirabilis   Infection of peripherally inserted central venous catheter (PICC)   History of MDR Pseudomonas aeruginosa infection   ESBL (extended spectrum beta-lactamase) producing bacteria infection   Craig Oneal. is a 67 y.o. male with paraplegia, COPD, PAF, HFrEF, chronic respiratory failure s/p trach and PEG with multiple recent admissions for UTIs, MDR pseudomonas and Candidemia who presented to [facility] with septic shock, found to have MRSA and ESBL bacteremia. TEE w/o vegetations. Patient with recurrent fever for 2 episodes, however no  other acute changes to indicate further uncovered infection; CXR from today without evidence of pneumonia.  Plan: --as no other indication that fever is from new source, will continue monitoring for now --continue vanc and invanz; Vanc for at least 4 weeks, invanz 7-10 days post PICC removal (2/2) --f/u repeat blood cultures from 2/2, 2/4 (post PICC removal)   LOS: 6 days   [name]  PGY 2 Infectious Diseases [phone] 10/13/2017, 11:29 AM

## 2017-10-14 DIAGNOSIS — A4102 Sepsis due to Methicillin resistant Staphylococcus aureus: Secondary | ICD-10-CM | POA: Diagnosis not present

## 2017-10-14 DIAGNOSIS — R6521 Severe sepsis with septic shock: Secondary | ICD-10-CM | POA: Diagnosis not present

## 2017-10-14 DIAGNOSIS — E039 Hypothyroidism, unspecified: Secondary | ICD-10-CM

## 2017-10-14 DIAGNOSIS — I4891 Unspecified atrial fibrillation: Secondary | ICD-10-CM | POA: Diagnosis not present

## 2017-10-14 DIAGNOSIS — G8220 Paraplegia, unspecified: Secondary | ICD-10-CM | POA: Diagnosis not present

## 2017-10-14 DIAGNOSIS — I5020 Unspecified systolic (congestive) heart failure: Secondary | ICD-10-CM | POA: Diagnosis not present

## 2017-10-14 DIAGNOSIS — G319 Degenerative disease of nervous system, unspecified: Secondary | ICD-10-CM | POA: Diagnosis not present

## 2017-10-14 DIAGNOSIS — I480 Paroxysmal atrial fibrillation: Secondary | ICD-10-CM | POA: Diagnosis not present

## 2017-10-14 DIAGNOSIS — E870 Hyperosmolality and hypernatremia: Secondary | ICD-10-CM

## 2017-10-14 DIAGNOSIS — J9611 Chronic respiratory failure with hypoxia: Secondary | ICD-10-CM | POA: Diagnosis not present

## 2017-10-14 DIAGNOSIS — Z87440 Personal history of urinary (tract) infections: Secondary | ICD-10-CM | POA: Diagnosis not present

## 2017-10-14 DIAGNOSIS — J9610 Chronic respiratory failure, unspecified whether with hypoxia or hypercapnia: Secondary | ICD-10-CM | POA: Diagnosis not present

## 2017-10-14 DIAGNOSIS — J449 Chronic obstructive pulmonary disease, unspecified: Secondary | ICD-10-CM | POA: Diagnosis not present

## 2017-10-14 DIAGNOSIS — Z89611 Acquired absence of right leg above knee: Secondary | ICD-10-CM | POA: Diagnosis not present

## 2017-10-14 DIAGNOSIS — A419 Sepsis, unspecified organism: Secondary | ICD-10-CM | POA: Diagnosis not present

## 2017-10-14 DIAGNOSIS — Z9911 Dependence on respirator [ventilator] status: Secondary | ICD-10-CM | POA: Diagnosis not present

## 2017-10-14 DIAGNOSIS — Z931 Gastrostomy status: Secondary | ICD-10-CM | POA: Diagnosis not present

## 2017-10-14 DIAGNOSIS — R7881 Bacteremia: Secondary | ICD-10-CM | POA: Diagnosis not present

## 2017-10-14 DIAGNOSIS — Z930 Tracheostomy status: Secondary | ICD-10-CM | POA: Diagnosis not present

## 2017-10-14 LAB — BASIC METABOLIC PANEL
ANION GAP: 12 (ref 5–15)
Anion gap: 12 (ref 5–15)
BUN: 27 mg/dL — ABNORMAL HIGH (ref 6–20)
BUN: 26 mg/dL — ABNORMAL HIGH (ref 6–20)
CHLORIDE: 112 mmol/L — AB (ref 101–111)
CHLORIDE: 110 mmol/L (ref 101–111)
CO2: 30 mmol/L (ref 22–32)
CO2: 31 mmol/L (ref 22–32)
CREATININE: 0.84 mg/dL (ref 0.61–1.24)
CREATININE: 0.79 mg/dL (ref 0.61–1.24)
Calcium: 8.5 mg/dL — ABNORMAL LOW (ref 8.9–10.3)
Calcium: 8.5 mg/dL — ABNORMAL LOW (ref 8.9–10.3)
GFR calc non Af Amer: >60 mL/min (ref >60)
GFR calc non Af Amer: >60 mL/min (ref >60)
GLUCOSE: 129 mg/dL — AB (ref 65–99)
Glucose, Bld: 114 mg/dL — ABNORMAL HIGH (ref 65–99)
POTASSIUM: 4.1 mmol/L (ref 3.5–5.1)
Potassium: 4.2 mmol/L (ref 3.5–5.1)
SODIUM: 154 mmol/L — AB (ref 135–145)
Sodium: 153 mmol/L — ABNORMAL HIGH (ref 135–145)

## 2017-10-14 LAB — GLUCOSE, CAPILLARY
GLUCOSE-CAPILLARY: 104 mg/dL — AB (ref 65–99)
GLUCOSE-CAPILLARY: 110 mg/dL — AB (ref 65–99)
GLUCOSE-CAPILLARY: 123 mg/dL — AB (ref 65–99)
Glucose-Capillary: 118 mg/dL — ABNORMAL HIGH (ref 65–99)

## 2017-10-14 LAB — URINE CULTURE: CULTURE: NO GROWTH

## 2017-10-14 LAB — VANCOMYCIN, TROUGH: Vancomycin Tr: 17 ug/mL (ref 15–20)

## 2017-10-14 LAB — MAGNESIUM: MAGNESIUM: 1.9 mg/dL (ref 1.7–2.4)

## 2017-10-14 LAB — CBC
HEMATOCRIT: 27.2 % — AB (ref 39.0–52.0)
HEMOGLOBIN: 7.5 g/dL — AB (ref 13.0–17.0)
MCH: 23.2 pg — ABNORMAL LOW (ref 26.0–34.0)
MCHC: 27.6 g/dL — ABNORMAL LOW (ref 30.0–36.0)
MCV: 84.2 fL (ref 78.0–100.0)
Platelets: 180 10*3/uL (ref 150–400)
RBC: 3.23 MIL/uL — ABNORMAL LOW (ref 4.22–5.81)
RDW: 19.2 % — ABNORMAL HIGH (ref 11.5–15.5)
WBC: 15.6 10*3/uL — AB (ref 4.0–10.5)

## 2017-10-14 MED ORDER — IPRATROPIUM BROMIDE 0.02 % IN SOLN
0.5000 mg | Freq: Three times a day (TID) | RESPIRATORY_TRACT | Status: DC
  Administered 2017-10-14 (×2): 0.5 mg via RESPIRATORY_TRACT
  Filled 2017-10-14 (×2): qty 2.5

## 2017-10-14 MED ORDER — DEXTROSE-NACL 5-0.9 % IV SOLN: 50.0000 mL/h | INTRAVENOUS | 0 refills | Status: AC

## 2017-10-14 MED ORDER — FREE WATER
200.0000 mL | Status: DC
  Administered 2017-10-14 (×3): 200 mL

## 2017-10-14 MED ORDER — VANCOMYCIN HCL 500 MG IV SOLR: 500.0000 mg | Freq: Two times a day (BID) | INTRAVENOUS | 0 refills | Status: AC

## 2017-10-14 MED ORDER — METOPROLOL TARTRATE 25 MG/10 ML ORAL SUSPENSION: 12.5000 mg | Freq: Two times a day (BID) | ORAL | 0 refills | Status: AC

## 2017-10-14 MED ORDER — SODIUM CHLORIDE 0.9 % IV SOLN: 1.0000 g | INTRAVENOUS | 0 refills | Status: AC

## 2017-10-14 MED ORDER — FREE WATER: 200.0000 mL | 0 refills | Status: AC

## 2017-10-14 MED ORDER — IPRATROPIUM BROMIDE 0.02 % IN SOLN: 0.5000 mg | Freq: Three times a day (TID) | RESPIRATORY_TRACT | 0 refills | Status: AC

## 2017-10-14 MED ORDER — LEVOTHYROXINE SODIUM 125 MCG PO TABS: 125.0000 ug | ORAL_TABLET | Freq: Every day | ORAL | 0 refills | Status: AC

## 2017-10-14 MED ORDER — INSULIN ASPART 100 UNIT/ML ~~LOC~~ SOLN
0.0000 [IU] | SUBCUTANEOUS | 0 refills | Status: AC
Start: 2017-10-14 — End: ?

## 2017-10-14 MED ORDER — PANTOPRAZOLE SODIUM 40 MG PO PACK: 40.0000 mg | PACK | Freq: Every day | ORAL | 0 refills | Status: AC

## 2017-10-14 MED ORDER — VITAL AF 1.2 CAL PO LIQD: 1000.0000 mL | ORAL | 0 refills | Status: AC

## 2017-10-14 MED ORDER — LEVALBUTEROL HCL 0.63 MG/3ML IN NEBU
0.6300 mg | INHALATION_SOLUTION | Freq: Three times a day (TID) | RESPIRATORY_TRACT | Status: DC
  Administered 2017-10-14 (×2): 0.63 mg via RESPIRATORY_TRACT
  Filled 2017-10-14 (×5): qty 3

## 2017-10-14 MED ORDER — LEVALBUTEROL HCL 0.63 MG/3ML IN NEBU: 0.6300 mg | INHALATION_SOLUTION | Freq: Three times a day (TID) | RESPIRATORY_TRACT | 0 refills | Status: AC

## 2017-10-14 MED ORDER — DILTIAZEM HCL 30 MG PO TABS: 30.0000 mg | ORAL_TABLET | Freq: Four times a day (QID) | ORAL | 0 refills | Status: AC

## 2017-10-14 NOTE — Progress Notes (Signed)
Patient being dc to [facility] now vanc tr came back 17 therapeutic - continue current dose for dc  Isaac BlissMichael Gergory Biello, PharmD, BCPS, BCCCP Clinical Pharmacist Clinical phone for 10/14/2017 from 1430 [phone]: [phone] If after 2300, please call main pharmacy at: [phone] 10/14/2017 5:55 PM

## 2017-10-14 NOTE — Discharge Summary (Signed)
Physician Discharge Summary  Craig Oneal. TZG:[id number] DOB: 03-12-1951 DOA: 10/06/2017  PCP: System, Pcp Not In  Admit date: 10/06/2017 Discharge date: 10/14/2017  Time spent: 35 minutes  Recommendations for Outpatient Follow-up:   Chronic Respiratory Failure with Hypoxia on nocturnal Vent -Currently on trach collar: FiO2 60% -Patient on vent at night.  PRVC  Vt Set; 545m  Set rate: 15  FiO2: 30%  PEEP: 5   COPD without exacerbation -See respiratory failure   Septic shock: Positive MRSA bacteremia/ Escherichia coli/ Proteus Mirabilis -TEE negative for vegetations see results below -Continue PIV 2 for line holiday until blood cultures from 2/4 result as negative then we will place PICC line. [facility] has agreed to place PICC line (Saturday or Sunday if cultures remain negative).  -Continue antibiotics per ID instructions. -Per Infectious Disease continue invanz for 7-10 days following PICC removal and vancomycin for at least 4 weeks given no vegetations. PICC line removed 2/2   AFib with RVR -Currently rate controlled ;   - TEE 2/6 negative for IE   Chronic PEG -Negative sign of infection   Elevated Alk phos/ mild Transaminitis  - improving    Anemia unspecified type without bleeding (baseline HgB~7-9) Recent Labs  Lab 10/09/17 0615 10/10/17 0324 10/11/17 0251 10/12/17 0335 10/13/17 0229 10/14/17 0225  HGB 7.4* 6.7* 7.5* 7.4* 7.4* 7.5*  -Stable   Hyperglycemia/Hypoglycemia -Resolved   Hypernatremia -Free water 200 ml q 4 hr    Hypothyroidism  -Synthroid 125 g daily    Cerebellar atrophy -Hold sedative medication     Discharge Diagnoses:  Active Problems:   AKI (acute kidney injury) ([place]   Chronic respiratory failure (HCC)   Septic shock (HCC)   Chronic obstructive pulmonary disease (HCC)   History of Clostridium difficile infection   Pressure injury of skin   MRSA bacteremia   E coli bacteremia   Bacterial infection due to Proteus  mirabilis   Infection of peripherally inserted central venous catheter (PICC)   History of MDR Pseudomonas aeruginosa infection   ESBL (extended spectrum beta-lactamase) producing bacteria infection   Distended abdomen   Discharge Condition: Stable  Diet recommendation: Vital 1.2 Cal 758mhr  Filed Weights   10/12/17 0448 10/13/17 0500 10/14/17 0500  Weight: 179 lb 14.3 oz (81.6 kg) 179 lb 14.3 oz (81.6 kg) 173 lb 1 oz (78.5 kg)    History of present illness:  67 year old BM PMHx of 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 Blood cultures sent and started on cefepime 11/26 and amikacin 11/27.  Additionally, daughter reports patient normally requires ventilator at night normally but has required continously for the last two days.     [facility] with proteus bacteremia, septic and hemorrhagic shock.  Additionally has history of serratia and pseudomonas from tracheal aspirate in July, both sensitive to Cefepime, as well as enterobacter and morganella.  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 [facility]15/ 2018 for severe sepsis positive VRE UTI, Pseudomonas HCAP, CRE, fungemia, Acute on CKD stage  III.    Admitted to [facility] (1/13 to 1/20) for sepsis secondary to multidrug resistant pneumonia culture positive for Pseudomonas, Moraxella, and Serratia and multidrug resistant E. coli urinary tract infection.  He was initially managed with meropenem and  ciprofloxacin.  Infectious diseases was consulted and antibiotics were changed to ciprofloxacin via PEG. He was discharged to [facility] on this regimen until 1/22. Then 1/30 he developed fevers and was felt to possibly have developed pneumonia again. He was started on amikacin and levofloxacin for this. 1/31 fevers worsened up to 103F and he became hypotensive, which persisted despite IVF bolus at [facility].    He was sent to [facility] ED on 1/31 for further evaluation where he remained hypotensive despite 3L IV fluid resuscitation and required vasopressor support.  Labs significant for WBC 13.6, Hgb 8.0, sCr 1.97, AST 91, normal lactic acid, and UA with moderate leukocytes with many bacteria, nitrite negative.  He was admitted to the ICU by PCCM service for septic shock and placed on vasopressor support.    He additionally required short term rate control with amiodarone drip and was placed on heparin gtt.  He was noted to have watery diarrhea and given his history of Cdiff and recent MDRO with heavy antibiotic use, he was empirically started on oral vancomycin until stool PCR was negative for Cdiff on 2/1.  Infectious disease was consulted again after blood cultures found to be positive for MRSA on ESBL bacteremia.  He remained on nocturnal ventilation with transition back to trach collar during the day on 2/2.  His PICC line was discontinued on 2/2 with repeat blood cultures drawn and repeated 2/4 still pending.  Heparin gtt was stopped on 2/4 secondary to anemia and required transfusion of 1 unit PRBC for Hgb of 6.7 without signs of bleeding.  His transaminitis, noted back to his prevous admission, thought due to shock and sepsis continues to slowly improve.  Meropenum was changed to Invanz by ID on 2/5. A transesophageal echocardiogram was performed on 2/5 in which was negative for endocarditis.  Infectious Disease recommendations given to continue invanz for 7-10 days following PICC removal and vancomycin for at  least 4 weeks given no vegetations.    During his hospitalization patient again treated for multidrug resistant bacteremia. Patient responded well to antibiotics. Patient's hospitalization complicated by A. fib with RVR which is now current the rate controlled (most likely exacerbated by his bacteremia). Stable for discharge     Procedures: PICC RUE 1/18 >>>2/2 Trach 07/2016>>>   Consultations: [facility]    Cultures  1/31 urine positive multiple species 1/31 blood positive MRSA, Escherichia coli, Proteus Mirabilis 2/2 blood negative 2/4 blood NGTD 2/7 urine pending     Antibiotics Anti-infectives (From admission, onward)   Start     Stop   10/12/17 0500  vancomycin (VANCOCIN) 500 mg in sodium chloride 0.9 % 100 mL IVPB     11/05/17 2359   10/11/17 1400  ertapenem (INVANZ) 1 g in sodium chloride 0.9 % 50 mL IVPB     10/18/17 2359   10/11/17 1245  ertapenem (INVANZ) injection 1 g  Status:  Discontinued     10/11/17 1247   10/10/17 0600  vancomycin (VANCOCIN) IVPB 750 mg/150 ml premix  Status:  Discontinued     10/11/17 1729   10/09/17 1400  meropenem (MERREM) 1 g in sodium chloride 0.9 % 100 mL IVPB  Status:  Discontinued     10/11/17 1244   10/08/17 1800  vancomycin (  VANCOCIN) IVPB 1000 mg/200 mL premix  Status:  Discontinued     10/09/17 1807   10/08/17 1400  ceftazidime-avibactam (AVYCAZ) 2.5 g in dextrose 5 % 50 mL IVPB  Status:  Discontinued     10/09/17 1217   10/07/17 2200  linezolid (ZYVOX) IVPB 600 mg  Status:  Discontinued     10/07/17 1640   10/07/17 1800  ceftolozane-tazobactam (ZERBAXA) 750 mg in sodium chloride 0.9 % 100 mL IVPB  Status:  Discontinued     10/07/17 1706   10/07/17 1800  vancomycin (VANCOCIN) IVPB 750 mg/150 ml premix  Status:  Discontinued     10/08/17 0949   10/07/17 1800  ceftazidime-avibactam (AVYCAZ) 1.25 g in dextrose 5 % 50 mL IVPB  Status:  Discontinued     10/08/17 0948   10/07/17 1400  linezolid (ZYVOX) tablet 600 mg  Status:   Discontinued     10/07/17 1609   10/07/17 0515  vancomycin (VANCOCIN) 50 mg/mL oral solution 125 mg  Status:  Discontinued     10/07/17 0844   10/07/17 0100  linezolid (ZYVOX) IVPB 600 mg     10/07/17 0220   10/07/17 0100  ceftolozane-tazobactam (ZERBAXA) 750 mg in sodium chloride 0.9 % 100 mL IVPB     10/07/17 1122       Discharge Exam: Vitals:   10/14/17 1400 10/14/17 1500 10/14/17 1501 10/14/17 1526  BP: 116/61 118/65 118/65   Pulse: 67 69 96   Resp: _0 Temp:    98.3 F (36.8 C)  TempSrc:    Oral  SpO2: 98% 100% 100%   Weight:      Height:        General:  A/O 2 (does not know where, when), positive chronic respiratory distress (worse today i.e. increased FiO2 requirement on trach collar) Neck:  Negative scars, masses, torticollis, lymphadenopathy, JVD, #8 cuffed trach in place negative sign of infection Lungs: coarse breath sounds, Clear to auscultation bilaterally without wheezes or crackles Cardiovascular: Irregular irregular rhythm and rate, without murmur gallop or rub normal S1 and S2   Discharge Instructions   Allergies as of 10/14/2017   No Known Allergies     Medication List    STOP taking these medications   amikacin IVPB Commonly known as:  AMIKIN   ceFEPIme 2 g in dextrose 5 % 50 mL   chlorhexidine gluconate (MEDLINE KIT) 0.12 % solution Commonly known as:  PERIDEX   ciprofloxacin 500 MG/5ML (10%) suspension Commonly known as:  CIPRO   Darbepoetin Alfa 25 MCG/ML Soln   dextrose 5 % and 0.45% NaCl 5-0.45 % Replaced by:  dextrose 5 % and 0.9% NaCl 5-0.9 % infusion   enoxaparin 80 MG/0.8ML injection Commonly known as:  LOVENOX   fentaNYL 100 MCG/2ML injection Commonly known as:  SUBLIMAZE   ipratropium-albuterol 0.5-2.5 (3) MG/3ML Soln Commonly known as:  DUONEB   levofloxacin 500 MG/100ML Soln Commonly known as:  LEVAQUIN   linezolid 600 MG/300ML IVPB Commonly known as:  ZYVOX   loratadine 10 MG tablet Commonly known as:   CLARITIN   metoprolol tartrate 25 MG tablet Commonly known as:  LOPRESSOR Replaced by:  metoprolol tartrate 25 mg/10 mL Susp   sodium chloride irrigation 0.9 % irrigation   warfarin 10 MG tablet Commonly known as:  COUMADIN     TAKE these medications   acetaminophen 325 MG tablet Commonly known as:  TYLENOL Place 650 mg into feeding tube every 6 (six) hours as needed  for mild pain (temp greater than than 100.6).   atorvastatin 10 MG tablet Commonly known as:  LIPITOR Place 1 tablet (10 mg total) into feeding tube at bedtime.   Baclofen 5 MG Tabs Place 5 mg into feeding tube 2 (two) times daily.   collagenase ointment Commonly known as:  SANTYL Apply topically daily. Apply Santyl to right buttock wound Q day, then cover with moist 2X2 and foam dressing.  (Change foam dressing Q 3 days or PRN soiling.)   dextrose 5 % and 0.9% NaCl 5-0.9 % infusion Inject 50 mL/hr into the vein continuous. Replaces:  dextrose 5 % and 0.45% NaCl 5-0.45 %   diltiazem 30 MG tablet Commonly known as:  CARDIZEM Take 1 tablet (30 mg total) by mouth every 6 (six) hours. What changed:    how to take this  when to take this  additional instructions  Another medication with the same name was removed. Continue taking this medication, and follow the directions you see here.   ertapenem 1 g in sodium chloride 0.9 % 50 mL Inject 1 g into the vein daily. Start taking on:  10/15/2017   feeding supplement (PRO-STAT SUGAR FREE 64) Liqd Place 30 mLs into feeding tube 2 (two) times daily.   feeding supplement (VITAL AF 1.2 CAL) Liqd Place 1,000 mLs into feeding tube continuous. What changed:    how much to take  when to take this  additional instructions   fluticasone 50 MCG/ACT nasal spray Commonly known as:  FLONASE Place 2 sprays into both nostrils daily.   free water Soln Place 200 mLs into feeding tube every 4 (four) hours.   glycopyrrolate 1 MG tablet Commonly known as:   ROBINUL Place 1 tablet (1 mg total) into feeding tube 2 (two) times daily.   insulin aspart 100 UNIT/ML injection Commonly known as:  novoLOG Inject 0-9 Units into the skin every 4 (four) hours. What changed:    medication strength  how much to take  when to take this  reasons to take this  additional instructions   ipratropium 0.02 % nebulizer solution Commonly known as:  ATROVENT Take 2.5 mLs (0.5 mg total) by nebulization 3 (three) times daily.   lactulose 10 GM/15ML solution Commonly known as:  CHRONULAC Take 15 mLs (10 g total) by mouth 2 (two) times daily as needed for severe constipation.   levalbuterol 0.63 MG/3ML nebulizer solution Commonly known as:  XOPENEX Take 3 mLs (0.63 mg total) by nebulization 3 (three) times daily.   levothyroxine 125 MCG tablet Commonly known as:  SYNTHROID, LEVOTHROID Place 1 tablet (125 mcg total) into feeding tube daily before breakfast. Start taking on:  10/15/2017   metoprolol tartrate 25 mg/10 mL Susp Commonly known as:  LOPRESSOR Place 5 mLs (12.5 mg total) into feeding tube 2 (two) times daily. Replaces:  metoprolol tartrate 25 MG tablet   montelukast 10 MG tablet Commonly known as:  SINGULAIR Place 10 mg into feeding tube at bedtime.   multivitamin with minerals Tabs tablet Place 1 tablet into feeding tube daily.   oxyCODONE 5 MG immediate release tablet Commonly known as:  Oxy IR/ROXICODONE 1 tablet (5 mg total) by Per J Tube route every 6 (six) hours as needed for moderate pain.   pantoprazole sodium 40 mg/20 mL Pack Commonly known as:  PROTONIX Place 20 mLs (40 mg total) into feeding tube at bedtime. What changed:  when to take this   simethicone 40 mg/0.69m Susp Commonly known as:  MYLICON  Place 0.6 mLs (40 mg total) into feeding tube every 6 (six) hours as needed for flatulence.   vancomycin 500 mg in sodium chloride 0.9 % 100 mL Inject 500 mg into the vein every 12 (twelve) hours.   Vitamin D  (Ergocalciferol) 50000 units Caps capsule Commonly known as:  DRISDOL Place 50,000 Units into feeding tube every Friday.      No Known Allergies    The results of significant diagnostics from this hospitalization (including imaging, microbiology, ancillary and laboratory) are listed below for reference.    Significant Diagnostic Studies: Ct Abdomen Pelvis W Contrast  Result Date: 09/18/2017 CLINICAL DATA:  67 year old male with hematuria and abnormal LFT. EXAM: CT ABDOMEN AND PELVIS WITH CONTRAST TECHNIQUE: Multidetector CT imaging of the abdomen and pelvis was performed using the standard protocol following bolus administration of intravenous contrast. CONTRAST:  149m ISOVUE-300 IOPAMIDOL (ISOVUE-300) INJECTION 61% COMPARISON:  Abdominal radiograph dated 09/18/2017 and CT of the abdomen pelvis dated 08/04/2017 and 08/02/2017. FINDINGS: Evaluation is limited due to streak artifact caused by patient's arms. Lower chest: Partially visualized small bilateral pleural effusions. There is cardiomegaly with coronary vascular calcification. Partially visualized tip of the catheter in the right atrium. There is diffuse interstitial prominence consistent with CHF. Bibasilar subsegmental atelectasis versus infiltrate noted. No intra-abdominal free air. Small free fluid adjacent to the liver. Hepatobiliary: Minimal irregularity of the hepatic contour concerning for early changes of cirrhosis. Clinical correlation is recommended. A 12 mm subcapsular hypodense focus in the right lobe of the liver (series 3, image 30) is not characterized but may represent an area of scarring. This is similar to the CT of 08/02/2017. there is no intrahepatic biliary ductal dilatation. Layering sludge or small stones noted in the gallbladder. No pericholecystic fluid or evidence of acute cholecystitis by CT. Pancreas: Unremarkable. No pancreatic ductal dilatation or surrounding inflammatory changes. Spleen: Normal in size without  focal abnormality. Adrenals/Urinary Tract: The adrenal glands are unremarkable. Mild bilateral hydronephrosis. There is interval improvement of the right-sided hydronephrosis compared to the prior CT. There is a 1 cm left renal cyst. Subcentimeter left renal hypodense lesion is not well characterized. There is mild enhancement of the urothelium. Correlation with urinalysis recommended to exclude UTI. No stone identified. The urinary bladder is unremarkable. A Foley catheter is seen with balloon in the prostatic urethra and tip just entering the bladder. Recommend further advancing of the Foley into the bladder. Stomach/Bowel: A percutaneous gastrostomy is noted with balloon and tip in the body of the stomach. Moderate amount of stool noted throughout the colon. There is no bowel obstruction or active inflammation. Vascular/Lymphatic: There is moderate aortoiliac atherosclerotic disease. 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 There is reconstitution of the flow in the distal right external iliac artery. The origins of the celiac axis, SMA remain patent. No portal venous gas. There is no adenopathy. Mildly rounded lymph node along the right iliac chain measures 9 mm in diameter similar to prior CT. Reproductive: Mildly enlarged prostate gland measures 5.5 cm in diameter. Other: Right ischial deep decubitus ulcer extends close to the ischial tuberosity. No evidence of osteomyelitis by CT. No drainable fluid collection or abscess. Musculoskeletal: No acute osseous pathology. IMPRESSION: 1. Cardiomegaly with findings of CHF, small bilateral pleural effusions, and bibasilar atelectasis versus infiltrate. Clinical correlation is recommended. 2. Mild bilateral hydronephrosis. Correlation with urinalysis recommended to exclude UTI. 3. Foley catheter with balloon in the prostatic urethra.  Recommend further advancing the catheter into the bladder. 4. No  bowel obstruction or active inflammation. Percutaneous gastrostomy within the stomach. 5. Slight irregularity of the hepatic contour concerning for early changes of cirrhosis. Clinical correlation is recommended. 6. Layering sludge or small stones. 7. Advanced Aortic Atherosclerosis (ICD10-I70.0). 8. 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 This is of indeterminate chronicity but likely chronic. Clinical correlation is recommended. 9. Right ischial decubitus ulcer. No fluid collection or abscess. No evidence of osteomyelitis by CT. Clinical correlation is recommended. Electronically Signed   By: Anner Crete M.D.   On: 09/18/2017 06:19   Dg Chest Port 1 View  Result Date: 10/13/2017 CLINICAL DATA:  Encounter for fever and pneumonia EXAM: PORTABLE CHEST 1 VIEW COMPARISON:  10/09/2017 FINDINGS: Tracheostomy tube in good position. Stable enlarged cardiac silhouette. There is dense LEFT basilar opacity. Fine interstitial pattern unchanged. No pneumothorax. IMPRESSION: 1. No significant change. 2. Dense LEFT lower lobe atelectasis versus infiltrate. 3. Fine interstitial edema. Electronically Signed   By: Suzy Bouchard M.D.   On: 10/13/2017 09:34   Dg Chest Port 1 View  Result Date: 10/09/2017 CLINICAL DATA:  Respiratory failure EXAM: PORTABLE CHEST 1 VIEW COMPARISON:  10/06/2017 FINDINGS: Tracheostomy. Right central venous catheter seen previously has been removed in the interval. Shallow inspiration. Cardiac enlargement with mild vascular congestion. Mild perihilar interstitial changes likely representing edema. Small bilateral pleural effusions. No significant change since previous study. No pneumothorax. IMPRESSION: Cardiac enlargement with mild pulmonary vascular congestion, bilateral interstitial edema, and small pleural effusions, similar to previous study. Electronically Signed   By: Lucienne Capers M.D.    On: 10/09/2017 06:26   Dg Chest Port 1 View  Result Date: 10/06/2017 CLINICAL DATA:  Fever and hypotension. Low blood pressure. History of COPD. EXAM: PORTABLE CHEST 1 VIEW COMPARISON:  09/21/2017 FINDINGS: Endotracheal tube is in place. Right PICC line with tip over the cavoatrial junction region. No pneumothorax. Cardiac enlargement. Mild interstitial pattern to the lungs suggesting mild edema. Blunting of the costophrenic angles suggesting tiny bilateral pleural effusions. Similar appearance to previous study. No developing consolidation. No pneumothorax. IMPRESSION: Appliances appear in satisfactory position. Cardiac enlargement with mild interstitial pulmonary edema and small bilateral pleural effusions, similar to previous study. Electronically Signed   By: Lucienne Capers M.D.   On: 10/06/2017 23:32   Dg Chest Port 1 View  Result Date: 09/21/2017 CLINICAL DATA:  Shortness of breath. EXAM: PORTABLE CHEST 1 VIEW COMPARISON:  09/19/2017 FINDINGS: Tracheostomy tube overlies the airway. The right PICC now crosses the midline and terminates over the region of the left brachiocephalic vein, a change from the prior study where the tip projected over the cavoatrial junction. The cardiac silhouette remains mildly enlarged. Mild bilateral perihilar opacity is similar to the prior study and likely represents edema. Left basilar opacity is unchanged and likely represents a combination of atelectasis and small pleural effusion. There is at most a trace residual right pleural effusion. No pneumothorax is identified. IMPRESSION: 1. Interval change in position of PICC tip, now in the left brachiocephalic vein. 2. Unchanged mild pulmonary edema, small left pleural effusion, and likely atelectasis in the left base. Electronically Signed   By: Logan Bores M.D.   On: 09/21/2017 09:12   Dg Chest Port 1 View  Result Date: 09/20/2017 CLINICAL DATA:  PICC placement EXAM: PORTABLE CHEST 1 VIEW COMPARISON:  Chest  radiograph from earlier today. FINDINGS: Right  PICC terminates at the cavoatrial junction. Tracheostomy tube terminates over the tracheal air column at the thoracic inlet. Stable cardiomediastinal silhouette with mild cardiomegaly. No pneumothorax. Stable small left pleural effusion. Stable trace right pleural effusion. Stable mild pulmonary edema and left basilar lung opacity. IMPRESSION: 1. Right PICC terminates at the cavoatrial junction. 2. Stable mild congestive heart failure. 3. Stable small left pleural effusion. 4. Stable left basilar lung opacity, probably atelectasis. Electronically Signed   By: Ilona Sorrel M.D.   On: 09/20/2017 00:51   Portable Chest 1 View  Result Date: 09/19/2017 CLINICAL DATA:  Tracheostomy. EXAM: PORTABLE CHEST 1 VIEW COMPARISON:  09/18/2017. FINDINGS: Tracheostomy is midline. Right PICC tip projects over the SVC RA junction. Heart is enlarged, stable. Mild to moderate mixed interstitial and airspace opacification, similar. Small bilateral pleural effusions. IMPRESSION: Congestive heart failure. Electronically Signed   By: Lorin Picket M.D.   On: 09/19/2017 07:39   Dg Chest Port 1 View  Result Date: 09/18/2017 CLINICAL DATA:  Sepsis EXAM: PORTABLE CHEST 1 VIEW COMPARISON:  340 hours FINDINGS: Stable tracheostomy tube and right PICC. Airspace disease throughout both lungs is stable on the right and worse on the left. No pneumothorax. Small pleural effusions are grossly unchanged. IMPRESSION: Diffuse bilateral airspace disease is stable on the right but worse on the left. Small pleural effusions. Electronically Signed   By: Marybelle Killings M.D.   On: 09/18/2017 12:50   Dg Chest Portable 1 View  Result Date: 09/18/2017 CLINICAL DATA:  Shortness of breath, on treatment for urinary tract infection. EXAM: PORTABLE CHEST 1 VIEW COMPARISON:  Chest radiograph August 15, 2017 FINDINGS: The cardiac silhouette is mildly enlarged and unchanged. Increasing interstitial prominence  with retrocardiac consolidation. Small pleural effusions. No pneumothorax. Tracheostomy tube tip projects 5.3 cm above the carina. RIGHT PICC distal tip projecting cavoatrial junction. No pneumothorax. Soft tissue planes and included osseous structures are nonacute. Osteopenia. IMPRESSION: Cardiomegaly. Increasing interstitial prominence seen with atypical infection or pulmonary edema with retrocardiac consolidation. Small pleural effusions. RIGHT PICC distal tip projects at cavoatrial junction. Tracheostomy tube in situ. Electronically Signed   By: Elon Alas M.D.   On: 09/18/2017 04:08   Dg Abd Portable 1v  Result Date: 10/10/2017 CLINICAL DATA:  Abdominal distention. EXAM: PORTABLE ABDOMEN - 1 VIEW COMPARISON:  CT abdomen and pelvis 09/18/2017. FINDINGS: No free intraperitoneal air is identified. The bowel gas pattern is nonobstructive. Feeding tube is noted. No abnormal abdominal calcification or acute bony abnormality. IMPRESSION: Negative exam. Electronically Signed   By: Inge Rise M.D.   On: 10/10/2017 09:58   Dg Abd Portable 2 Views  Result Date: 09/18/2017 CLINICAL DATA:  Shortness of breath, on treatment for urinary tract infection. No bowel movement for 4 days. EXAM: PORTABLE ABDOMEN - 2 VIEW COMPARISON:  Abdominal radiograph August 17, 2017 FINDINGS: Gastrostomy tube projects in stomach. Bowel gas pattern is nondilated and nonobstructive. Large body habitus. Phleboliths project in the pelvis. No intra-abdominal mass effect. Limited assessment for free air due to habitus. Soft tissue planes and included osseous structures are unchanged. IMPRESSION: Nonspecific bowel gas pattern.  Gastrostomy tube in situ. Electronically Signed   By: Elon Alas M.D.   On: 09/18/2017 04:10   US Abdomen Limited Ruq  Result Date: 09/18/2017 CLINICAL DATA:  Elevated LFTs EXAM: ULTRASOUND ABDOMEN LIMITED RIGHT UPPER QUADRANT COMPARISON:  CT 09/18/2017 FINDINGS: Gallbladder: Small layering  gallstones and sludge within the gallbladder. Small amount of pericholecystic fluid. Normal wall thickness. Negative sonographic Murphy's. Common bile  duct: Diameter: Normal caliber, 5-6 mm. Liver: Slightly nodular contours of the liver suggests the possibility of early cirrhosis. No focal hepatic abnormality. Portal vein is patent on color Doppler imaging with normal direction of blood flow towards the liver. IMPRESSION: Layering gall stones and sludge within the gallbladder. There is a small amount of pericholecystic fluid. There is no wall thickening or sonographic Murphy's sign. This fluid could be related to liver disease. Mildly nodular contours of the liver suggests cirrhosis. Electronically Signed   By: Rolm Baptise M.D.   On: 09/18/2017 07:42    Microbiology: Recent Results (from the past 240 hour(s))  Urine culture     Status: Abnormal   Collection Time: 10/06/17 10:43 PM  Result Value Ref Range Status   Specimen Description URINE, RANDOM  Final   Special Requests   Final    NONE Performed at [facility], [address], [place], [place] [address]    Culture MULTIPLE SPECIES PRESENT, SUGGEST RECOLLECTION (A)  Final   Report Status 10/08/2017 FINAL  Final  Blood culture (routine x 2)     Status: Abnormal   Collection Time: 10/06/17 10:46 PM  Result Value Ref Range Status   Specimen Description BLOOD LEFT FOREARM  Final   Special Requests   Final    BOTTLES DRAWN AEROBIC AND ANAEROBIC Blood Culture adequate volume   Culture  Setup Time   Final    GRAM NEGATIVE RODS GRAM POSITIVE COCCI IN PAIRS IN CLUSTERS IN BOTH AEROBIC AND ANAEROBIC BOTTLES CRITICAL RESULT CALLED TO, READ BACK BY AND VERIFIED WITH: J MILLEN,PHARMD AT 2993 10/07/17 BY L BENFIELD Performed at [facility], [place] [address] [place], [place] [address]    Culture (A)  Final    METHICILLIN RESISTANT STAPHYLOCOCCUS AUREUS ESCHERICHIA COLI Confirmed Extended Spectrum Beta-Lactamase Producer (ESBL).  In  bloodstream infections from ESBL organisms, carbapenems are preferred over piperacillin/tazobactam. They are shown to have a lower risk of mortality. PROTEUS MIRABILIS    Report Status 10/11/2017 FINAL  Final   Organism ID, Bacteria METHICILLIN RESISTANT STAPHYLOCOCCUS AUREUS  Final   Organism ID, Bacteria ESCHERICHIA COLI  Final   Organism ID, Bacteria PROTEUS MIRABILIS  Final      Susceptibility   Escherichia coli - MIC*    AMPICILLIN >=32 RESISTANT Resistant     CEFAZOLIN >=64 RESISTANT Resistant     CEFEPIME >=64 RESISTANT Resistant     CEFTAZIDIME RESISTANT Resistant     CEFTRIAXONE >=64 RESISTANT Resistant     CIPROFLOXACIN >=4 RESISTANT Resistant     GENTAMICIN <=1 SENSITIVE Sensitive     IMIPENEM <=0.25 SENSITIVE Sensitive     TRIMETH/SULFA >=320 RESISTANT Resistant     AMPICILLIN/SULBACTAM >=32 RESISTANT Resistant     PIP/TAZO <=4 SENSITIVE Sensitive     Extended ESBL POSITIVE Resistant     * ESCHERICHIA COLI   Methicillin resistant staphylococcus aureus - MIC*    CIPROFLOXACIN >=8 RESISTANT Resistant     ERYTHROMYCIN >=8 RESISTANT Resistant     GENTAMICIN <=0.5 SENSITIVE Sensitive     OXACILLIN >=4 RESISTANT Resistant     TETRACYCLINE <=1 SENSITIVE Sensitive     VANCOMYCIN 1 SENSITIVE Sensitive     TRIMETH/SULFA <=10 SENSITIVE Sensitive     CLINDAMYCIN <=0.25 SENSITIVE Sensitive     RIFAMPIN <=0.5 SENSITIVE Sensitive     Inducible Clindamycin NEGATIVE Sensitive     * METHICILLIN RESISTANT STAPHYLOCOCCUS AUREUS   Proteus mirabilis - MIC*    AMPICILLIN <=2 SENSITIVE Sensitive  CEFAZOLIN <=4 SENSITIVE Sensitive     CEFEPIME <=1 SENSITIVE Sensitive     CEFTAZIDIME <=1 SENSITIVE Sensitive     CEFTRIAXONE <=1 SENSITIVE Sensitive     CIPROFLOXACIN >=4 RESISTANT Resistant     GENTAMICIN <=1 SENSITIVE Sensitive     IMIPENEM 4 SENSITIVE Sensitive     TRIMETH/SULFA <=20 SENSITIVE Sensitive     AMPICILLIN/SULBACTAM <=2 SENSITIVE Sensitive     PIP/TAZO <=4 SENSITIVE  Sensitive     * PROTEUS MIRABILIS  Blood Culture ID Panel (Reflexed)     Status: Abnormal   Collection Time: 10/06/17 10:46 PM  Result Value Ref Range Status   Enterococcus species NOT DETECTED NOT DETECTED Final   Listeria monocytogenes NOT DETECTED NOT DETECTED Final   Staphylococcus species DETECTED (A) NOT DETECTED Final    Comment: CRITICAL RESULT CALLED TO, READ BACK BY AND VERIFIED WITH: J MILLEN,PHARMD AT 1534 10/08/17 BY L BENFIELD    Staphylococcus aureus DETECTED (A) NOT DETECTED Final    Comment: Methicillin (oxacillin)-resistant Staphylococcus aureus (MRSA). MRSA is predictably resistant to beta-lactam antibiotics (except ceftaroline). Preferred therapy is vancomycin unless clinically contraindicated. Patient requires contact precautions if  hospitalized. CRITICAL RESULT CALLED TO, READ BACK BY AND VERIFIED WITH: J MILLEN,PHARMD AT 1534 10/08/17 BY L BENFIELD    Methicillin resistance DETECTED (A) NOT DETECTED Final    Comment: CRITICAL RESULT CALLED TO, READ BACK BY AND VERIFIED WITH: J MILLEN,PHARMD AT 1534 10/08/17 BY L BENFIELD    Streptococcus species NOT DETECTED NOT DETECTED Final   Streptococcus agalactiae NOT DETECTED NOT DETECTED Final   Streptococcus pneumoniae NOT DETECTED NOT DETECTED Final   Streptococcus pyogenes NOT DETECTED NOT DETECTED Final   Acinetobacter baumannii NOT DETECTED NOT DETECTED Final   Enterobacteriaceae species DETECTED (A) NOT DETECTED Final    Comment: CRITICAL RESULT CALLED TO, READ BACK BY AND VERIFIED WITH: J MILLEN,PHARMD AT 1534 10/08/17 BY L BENFIELD    Enterobacter cloacae complex NOT DETECTED NOT DETECTED Final   Escherichia coli DETECTED (A) NOT DETECTED Final    Comment: CRITICAL RESULT CALLED TO, READ BACK BY AND VERIFIED WITH: J MILLEN,PHARMD AT 1534 10/08/17 BY L BENFIELD    Klebsiella oxytoca NOT DETECTED NOT DETECTED Final   Klebsiella pneumoniae NOT DETECTED NOT DETECTED Final   Proteus species DETECTED (A) NOT DETECTED Final     Comment: CRITICAL RESULT CALLED TO, READ BACK BY AND VERIFIED WITH: J MILLEN,PHARMD AT 1534 10/08/17 BY L BENFIELD    Serratia marcescens NOT DETECTED NOT DETECTED Final   Carbapenem resistance NOT DETECTED NOT DETECTED Final   Haemophilus influenzae NOT DETECTED NOT DETECTED Final   Neisseria meningitidis NOT DETECTED NOT DETECTED Final   Pseudomonas aeruginosa NOT DETECTED NOT DETECTED Final   Candida albicans NOT DETECTED NOT DETECTED Final   Candida glabrata NOT DETECTED NOT DETECTED Final   Candida krusei NOT DETECTED NOT DETECTED Final   Candida parapsilosis NOT DETECTED NOT DETECTED Final   Candida tropicalis NOT DETECTED NOT DETECTED Final    Comment: Performed at [facility], [address] [place], [place] [address]  Blood culture (routine x 2)     Status: Abnormal   Collection Time: 10/06/17 11:00 PM  Result Value Ref Range Status   Specimen Description BLOOD RIGHT ARM  Final   Special Requests   Final    BOTTLES DRAWN AEROBIC AND ANAEROBIC Blood Culture adequate volume   Culture  Setup Time   Final    GRAM POSITIVE COCCI IN CLUSTERS IN BOTH  AEROBIC AND ANAEROBIC BOTTLES CRITICAL VALUE NOTED.  VALUE IS CONSISTENT WITH PREVIOUSLY REPORTED AND CALLED VALUE.    Culture (A)  Final    STAPHYLOCOCCUS AUREUS SUSCEPTIBILITIES PERFORMED ON PREVIOUS CULTURE WITHIN THE LAST 5 DAYS. Performed at [facility], [place] [address] [place], [place] [address]    Report Status 10/09/2017 FINAL  Final  C difficile quick scan w PCR reflex     Status: None   Collection Time: 10/07/17  4:20 AM  Result Value Ref Range Status   C Diff antigen NEGATIVE NEGATIVE Final   C Diff toxin NEGATIVE NEGATIVE Final   C Diff interpretation No C. difficile detected.  Final  Culture, blood (Routine X 2) w Reflex to ID Panel     Status: None   Collection Time: 10/08/17 11:06 AM  Result Value Ref Range Status   Specimen Description BLOOD LEFT ANTECUBITAL  Final   Special Requests   Final     BOTTLES DRAWN AEROBIC AND ANAEROBIC Blood Culture adequate volume   Culture   Final    NO GROWTH 5 DAYS Performed at [facility], [address], [place], [place] [address]    Report Status 10/13/2017 FINAL  Final  Culture, blood (Routine X 2) w Reflex to ID Panel     Status: None (Preliminary result)   Collection Time: 10/10/17  8:39 PM  Result Value Ref Range Status   Specimen Description BLOOD RIGHT HAND  Final   Special Requests IN PEDIATRIC BOTTLE Blood Culture adequate volume  Final   Culture   Final    NO GROWTH 3 DAYS Performed at [facility], [place] [address] [place], [place] [address]    Report Status PENDING  Incomplete  Culture, blood (Routine X 2) w Reflex to ID Panel     Status: None (Preliminary result)   Collection Time: 10/10/17  8:42 PM  Result Value Ref Range Status   Specimen Description BLOOD RIGHT HAND  Final   Special Requests IN PEDIATRIC BOTTLE Blood Culture adequate volume  Final   Culture   Final    NO GROWTH 3 DAYS Performed at [facility], [place] [address], [place], [place] [address]    Report Status PENDING  Incomplete  Culture, Urine     Status: None   Collection Time: 10/13/17 12:16 PM  Result Value Ref Range Status   Specimen Description URINE, CATHETERIZED  Final   Special Requests Immunocompromised  Final   Culture   Final    NO GROWTH Performed at [facility], [place] [address] [place], [place] [address]    Report Status 10/14/2017 FINAL  Final     Labs: Basic Metabolic Panel: Recent Labs  Lab 10/08/17 0617 10/09/17 0615 10/10/17 0324 10/11/17 0251 10/12/17 1017 10/14/17 0225  NA 139 139 143 147* 150* 154*  K 3.3* 3.5 3.9 3.8 3.2* 4.1  CL 104 103 105 107 112* 112*  CO2 _0 GLUCOSE 190* 132* 103* 79 110* 114*  BUN 67* 59* 53* 43* 25* 27*  CREATININE 1.17 0.99 0.86 0.87 0.80 0.84  CALCIUM 8.1* 8.2* 8.1* 8.4* 8.7* 8.5*  MG 2.1  --   --   --  1.9 1.9  PHOS 1.8* 1.8*  --   --   --   --     Liver Function Tests: Recent Labs  Lab 10/09/17 0615 10/10/17 1008 10/12/17 1017  AST 137* 92* 32  ALT 160*  144* 74*  ALKPHOS 736* 744* 642*  BILITOT 1.3* 1.1 1.0  PROT 6.9 6.8 7.2  ALBUMIN 1.7* 1.7* 1.7*   No results for input(s): LIPASE, AMYLASE in the last 168 hours. No results for input(s): AMMONIA in the last 168 hours. CBC: Recent Labs  Lab 10/10/17 0324 10/11/17 0251 10/12/17 0335 10/13/17 0229 10/14/17 0225  WBC 13.9* 16.0* 13.3* 15.6* 15.6*  NEUTROABS  --  12.8*  --   --   --   HGB 6.7* 7.5* 7.4* 7.4* 7.5*  HCT 22.7* 25.2* 24.8* 26.1* 27.2*  MCV 79.6 79.2 80.5 82.3 84.2  PLT 159 156 179 164 180   Cardiac Enzymes: No results for input(s): CKTOTAL, CKMB, CKMBINDEX, TROPONINI in the last 168 hours. BNP: BNP (last 3 results) Recent Labs    08/01/17 1912 09/18/17 0347  BNP 316.7* 641.2*    ProBNP (last 3 results) No results for input(s): PROBNP in the last 8760 hours.  CBG: Recent Labs  Lab 10/13/17 2346 10/14/17 0341 10/14/17 0643 10/14/17 1110 10/14/17 1520  GLUCAP 113* 104* 110* 118* 123*       Signed:  Dia Crawford, MD [facility] [phone] pager

## 2017-10-14 NOTE — Progress Notes (Signed)
Pt seen by trach team for consult. No education needed at this time. All necessary equipment available at bedside. Will continue to monitor for needs.   

## 2017-10-14 NOTE — Progress Notes (Addendum)
4:46pm- CSw has called and set up transport at this time. CSW has updated pt's daughter as well as printed off discharge summary for facility and sent via hub. No further CSW needs. CSW signing off.   4:25pm- CSW has [facility] (coverage for the night [phone]) to inform him of handoff. CSW asked that discharge summary is sent to [facility]. Christiane HaJonathan is agreeable to send over needed information (discharge summary) to [facility] at this time. CSW has provided Christiane HaJonathan with [facility] and has updated RN at this time [facility] CS[facility] taking over on case at this time. This evening CSW signing off.   CSW spoke with Dr. Joseph ArtWoods and confirmed with him that [facility] is able to place PICC when needed as well as accept pt back on 60 percent vent at this time. CSW has already placed needed documents on the chart for [facility]. CSW will call and set up transport once discharge summary is in. CSW continues to follow for needs.   Claude MangesKierra S. [place]ana Montini, MSW, LCSW-A Emergency Department Clinical Social Worker [phone]

## 2017-10-14 NOTE — Progress Notes (Signed)
Subjective: Patient denies subjective fevers or chills, chest pain, shortness of breath, abd pain, N/V.   Antibiotics:  Anti-infectives (From admission, onward)   Start     Dose/Rate Route Frequency Ordered Stop   10/12/17 0500  vancomycin (VANCOCIN) 500 mg in sodium chloride 0.9 % 100 mL IVPB     500 mg 100 mL/hr over 60 Minutes Intravenous Every 12 hours 10/11/17 1738     10/11/17 1400  ertapenem (INVANZ) 1 g in sodium chloride 0.9 % 50 mL IVPB     1 g 100 mL/hr over 30 Minutes Intravenous Every 24 hours 10/11/17 1247 10/18/17 2359   10/11/17 1245  ertapenem (INVANZ) injection 1 g  Status:  Discontinued     1 g Intramuscular Every 24 hours 10/11/17 1244 10/11/17 1247   10/10/17 0600  vancomycin (VANCOCIN) IVPB 750 mg/150 ml premix  Status:  Discontinued     750 mg 150 mL/hr over 60 Minutes Intravenous Every 12 hours 10/09/17 1807 10/11/17 1729   10/09/17 1400  meropenem (MERREM) 1 g in sodium chloride 0.9 % 100 mL IVPB  Status:  Discontinued     1 g 200 mL/hr over 30 Minutes Intravenous Every 8 hours 10/09/17 1221 10/11/17 1244   10/08/17 1800  vancomycin (VANCOCIN) IVPB 1000 mg/200 mL premix  Status:  Discontinued     1,000 mg 200 mL/hr over 60 Minutes Intravenous Every 12 hours 10/08/17 0949 10/09/17 1807   10/08/17 1400  ceftazidime-avibactam (AVYCAZ) 2.5 g in dextrose 5 % 50 mL IVPB  Status:  Discontinued     2.5 g 25 mL/hr over 2 Hours Intravenous Every 8 hours 10/08/17 0948 10/09/17 1217   10/07/17 2200  linezolid (ZYVOX) IVPB 600 mg  Status:  Discontinued     600 mg 300 mL/hr over 60 Minutes Intravenous Every 12 hours 10/07/17 1609 10/07/17 1640   10/07/17 1800  ceftolozane-tazobactam (ZERBAXA) 750 mg in sodium chloride 0.9 % 100 mL IVPB  Status:  Discontinued     750 mg 105.7 mL/hr over 60 Minutes Intravenous Every 8 hours 10/07/17 1341 10/07/17 1706   10/07/17 1800  vancomycin (VANCOCIN) IVPB 750 mg/150 ml premix  Status:  Discontinued     750 mg 150 mL/hr  over 60 Minutes Intravenous Every 12 hours 10/07/17 1640 10/08/17 0949   10/07/17 1800  ceftazidime-avibactam (AVYCAZ) 1.25 g in dextrose 5 % 50 mL IVPB  Status:  Discontinued     1.25 g 25 mL/hr over 2 Hours Intravenous Every 8 hours 10/07/17 1707 10/08/17 0948   10/07/17 1400  linezolid (ZYVOX) tablet 600 mg  Status:  Discontinued     600 mg Per Tube Every 12 hours 10/07/17 1341 10/07/17 1609   10/07/17 0515  vancomycin (VANCOCIN) 50 mg/mL oral solution 125 mg  Status:  Discontinued     125 mg Oral 4 times daily 10/07/17 0501 10/07/17 0844   10/07/17 0100  linezolid (ZYVOX) IVPB 600 mg     600 mg 300 mL/hr over 60 Minutes Intravenous  Once 10/07/17 0059 10/07/17 0220   10/07/17 0100  ceftolozane-tazobactam (ZERBAXA) 750 mg in sodium chloride 0.9 % 100 mL IVPB     750 mg 105.7 mL/hr over 60 Minutes Intravenous Every 8 hours 10/07/17 0059 10/07/17 1122      Medications: Scheduled Meds: . chlorhexidine gluconate (MEDLINE KIT)  15 mL Mouth Rinse BID  . diltiazem  30 mg Oral Q6H  . free water  200 mL Per Tube Q4H  .  insulin aspart  0-9 Units Subcutaneous Q4H  . ipratropium  0.5 mg Nebulization TID  . levalbuterol  0.63 mg Nebulization TID  . levothyroxine  125 mcg Per Tube QAC breakfast  . mouth rinse  15 mL Mouth Rinse QID  . metoprolol tartrate  12.5 mg Per Tube BID  . pantoprazole sodium  40 mg Per Tube QHS   Continuous Infusions: . sodium chloride 250 mL (10/11/17 1800)  . sodium chloride    . sodium chloride    . dextrose 5 % and 0.9% NaCl Stopped (10/12/17 1136)  . ertapenem Stopped (10/13/17 1340)  . feeding supplement (VITAL AF 1.2 CAL) 1,000 mL (10/14/17 1000)  . vancomycin Stopped (10/14/17 0626)   PRN Meds:.sodium chloride, acetaminophen (TYLENOL) oral liquid 160 mg/5 mL, bisacodyl, fentaNYL (SUBLIMAZE) injection, fentaNYL (SUBLIMAZE) injection, sennosides  Objective: Weight change: -13.4 oz (-3.1 kg)  Intake/Output Summary (Last 24 hours) at 10/14/2017 1057 Last  data filed at 10/14/2017 1000 Gross per 24 hour  Intake 2390 ml  Output 1480 ml  Net 910 ml   Blood pressure 139/75, pulse (!) 106, temperature 100.1 F (37.8 C), temperature source Oral, resp. rate (!) 28, height 5' 10"  (1.778 m), weight 173 lb 1 oz (78.5 kg), SpO2 100 %. Temp:  [98.6 F (37 C)-100.1 F (37.8 C)] 100.1 F (37.8 C) (02/08 0732) Pulse Rate:  [51-148] 106 (02/08 1000) Resp:  [15-39] 28 (02/08 1000) BP: (87-139)/(50-75) 139/75 (02/08 0900) SpO2:  [88 %-100 %] 100 % (02/08 1000) FiO2 (%):  [30 %-60 %] 60 % (02/08 0802) Weight:  [173 lb 1 oz (78.5 kg)] 173 lb 1 oz (78.5 kg) (02/08 0500)  Physical Exam: General: Alert and awake, not in any acute distress. CVS: irregularly irregular, no M/R/G, radial and R DP pulses intact Chest: no increased work of breathing, CTAB on anterior lung fields Abdomen: soft, distended, +BS, nontender Extremities: s/p R AKA Skin: no rashes; warm and dry Neuro: paraplegic; nonfocal  CBC: CBC Latest Ref Rng & Units 10/14/2017 10/13/2017 10/12/2017  WBC 4.0 - 10.5 K/uL 15.6(H) 15.6(H) 13.3(H)  Hemoglobin 13.0 - 17.0 g/dL 7.5(L) 7.4(L) 7.4(L)  Hematocrit 39.0 - 52.0 % 27.2(L) 26.1(L) 24.8(L)  Platelets 150 - 400 K/uL 180 164 179    BMET Recent Labs    10/12/17 1017 10/14/17 0225  NA 150* 154*  K 3.2* 4.1  CL 112* 112*  CO2 28 30  GLUCOSE 110* 114*  BUN 25* 27*  CREATININE 0.80 0.84  CALCIUM 8.7* 8.5*   Liver Panel  Recent Labs    10/12/17 1017  PROT 7.2  ALBUMIN 1.7*  AST 32  ALT 74*  ALKPHOS 642*  BILITOT 1.0   Sedimentation Rate No results for input(s): ESRSEDRATE in the last 72 hours. C-Reactive Protein No results for input(s): CRP in the last 72 hours.  Micro Results: Recent Results (from the past 720 hour(s))  Urine Culture     Status: None   Collection Time: 09/18/17  3:37 AM  Result Value Ref Range Status   Specimen Description URINE, RANDOM  Final   Special Requests NONE  Final   Culture NO GROWTH  Final    Report Status 09/19/2017 FINAL  Final  Blood culture (routine x 2)     Status: None   Collection Time: 09/18/17  3:58 AM  Result Value Ref Range Status   Specimen Description BLOOD RIGHT HAND  Final   Special Requests   Final    BOTTLES DRAWN AEROBIC AND ANAEROBIC Blood Culture  adequate volume   Culture NO GROWTH 5 DAYS  Final   Report Status 09/23/2017 FINAL  Final  Blood culture (routine x 2)     Status: None   Collection Time: 09/18/17  4:03 AM  Result Value Ref Range Status   Specimen Description BLOOD LEFT HAND  Final   Special Requests   Final    BOTTLES DRAWN AEROBIC AND ANAEROBIC Blood Culture adequate volume   Culture NO GROWTH 5 DAYS  Final   Report Status 09/23/2017 FINAL  Final  MRSA PCR Screening     Status: None   Collection Time: 09/18/17 12:20 PM  Result Value Ref Range Status   MRSA by PCR NEGATIVE NEGATIVE Final    Comment:        The GeneXpert MRSA Assay (FDA approved for NASAL specimens only), is one component of a comprehensive MRSA colonization surveillance program. It is not intended to diagnose MRSA infection nor to guide or monitor treatment for MRSA infections.   Culture, respiratory (NON-Expectorated)     Status: None   Collection Time: 09/19/17  2:11 PM  Result Value Ref Range Status   Specimen Description TRACHEAL ASPIRATE  Final   Special Requests NONE  Final   Gram Stain   Final    ABUNDANT WBC PRESENT,BOTH PMN AND MONONUCLEAR ABUNDANT GRAM NEGATIVE RODS MODERATE SQUAMOUS EPITHELIAL CELLS PRESENT    Culture MODERATE PSEUDOMONAS AERUGINOSA  Final   Report Status 09/22/2017 FINAL  Final   Organism ID, Bacteria PSEUDOMONAS AERUGINOSA  Final      Susceptibility   Pseudomonas aeruginosa - MIC*    CEFTAZIDIME 16 INTERMEDIATE Intermediate     CIPROFLOXACIN 1 SENSITIVE Sensitive     GENTAMICIN <=1 SENSITIVE Sensitive     IMIPENEM >=16 RESISTANT Resistant     CEFEPIME 16 INTERMEDIATE Intermediate     * MODERATE PSEUDOMONAS AERUGINOSA  Urine  culture     Status: Abnormal   Collection Time: 10/06/17 10:43 PM  Result Value Ref Range Status   Specimen Description URINE, RANDOM  Final   Special Requests   Final    NONE Performed at [facility], [place] [address] [place], [place] [address]    Culture MULTIPLE SPECIES PRESENT, SUGGEST RECOLLECTION (A)  Final   Report Status 10/08/2017 FINAL  Final  Blood culture (routine x 2)     Status: Abnormal   Collection Time: 10/06/17 10:46 PM  Result Value Ref Range Status   Specimen Description BLOOD LEFT FOREARM  Final   Special Requests   Final    BOTTLES DRAWN AEROBIC AND ANAEROBIC Blood Culture adequate volume   Culture  Setup Time   Final    GRAM NEGATIVE RODS GRAM POSITIVE COCCI IN PAIRS IN CLUSTERS IN BOTH AEROBIC AND ANAEROBIC BOTTLES CRITICAL RESULT CALLED TO, READ BACK BY AND VERIFIED WITH: J MILLEN,PHARMD AT 6063 10/07/17 BY L BENFIELD Performed at [facility], [place] [address] [place], [place] [address]    Culture (A)  Final    METHICILLIN RESISTANT STAPHYLOCOCCUS AUREUS ESCHERICHIA COLI Confirmed Extended Spectrum Beta-Lactamase Producer (ESBL).  In bloodstream infections from ESBL organisms, carbapenems are preferred over piperacillin/tazobactam. They are shown to have a lower risk of mortality. PROTEUS MIRABILIS    Report Status 10/11/2017 FINAL  Final   Organism ID, Bacteria METHICILLIN RESISTANT STAPHYLOCOCCUS AUREUS  Final   Organism ID, Bacteria ESCHERICHIA COLI  Final   Organism ID, Bacteria PROTEUS MIRABILIS  Final      Susceptibility   Escherichia coli -  MIC*    AMPICILLIN >=32 RESISTANT Resistant     CEFAZOLIN >=64 RESISTANT Resistant     CEFEPIME >=64 RESISTANT Resistant     CEFTAZIDIME RESISTANT Resistant     CEFTRIAXONE >=64 RESISTANT Resistant     CIPROFLOXACIN >=4 RESISTANT Resistant     GENTAMICIN <=1 SENSITIVE Sensitive     IMIPENEM <=0.25 SENSITIVE Sensitive     TRIMETH/SULFA >=320 RESISTANT Resistant     AMPICILLIN/SULBACTAM >=32  RESISTANT Resistant     PIP/TAZO <=4 SENSITIVE Sensitive     Extended ESBL POSITIVE Resistant     * ESCHERICHIA COLI   Methicillin resistant staphylococcus aureus - MIC*    CIPROFLOXACIN >=8 RESISTANT Resistant     ERYTHROMYCIN >=8 RESISTANT Resistant     GENTAMICIN <=0.5 SENSITIVE Sensitive     OXACILLIN >=4 RESISTANT Resistant     TETRACYCLINE <=1 SENSITIVE Sensitive     VANCOMYCIN 1 SENSITIVE Sensitive     TRIMETH/SULFA <=10 SENSITIVE Sensitive     CLINDAMYCIN <=0.25 SENSITIVE Sensitive     RIFAMPIN <=0.5 SENSITIVE Sensitive     Inducible Clindamycin NEGATIVE Sensitive     * METHICILLIN RESISTANT STAPHYLOCOCCUS AUREUS   Proteus mirabilis - MIC*    AMPICILLIN <=2 SENSITIVE Sensitive     CEFAZOLIN <=4 SENSITIVE Sensitive     CEFEPIME <=1 SENSITIVE Sensitive     CEFTAZIDIME <=1 SENSITIVE Sensitive     CEFTRIAXONE <=1 SENSITIVE Sensitive     CIPROFLOXACIN >=4 RESISTANT Resistant     GENTAMICIN <=1 SENSITIVE Sensitive     IMIPENEM 4 SENSITIVE Sensitive     TRIMETH/SULFA <=20 SENSITIVE Sensitive     AMPICILLIN/SULBACTAM <=2 SENSITIVE Sensitive     PIP/TAZO <=4 SENSITIVE Sensitive     * PROTEUS MIRABILIS  Blood Culture ID Panel (Reflexed)     Status: Abnormal   Collection Time: 10/06/17 10:46 PM  Result Value Ref Range Status   Enterococcus species NOT DETECTED NOT DETECTED Final   Listeria monocytogenes NOT DETECTED NOT DETECTED Final   Staphylococcus species DETECTED (A) NOT DETECTED Final    Comment: CRITICAL RESULT CALLED TO, READ BACK BY AND VERIFIED WITH: J MILLEN,PHARMD AT 1534 10/08/17 BY L BENFIELD    Staphylococcus aureus DETECTED (A) NOT DETECTED Final    Comment: Methicillin (oxacillin)-resistant Staphylococcus aureus (MRSA). MRSA is predictably resistant to beta-lactam antibiotics (except ceftaroline). Preferred therapy is vancomycin unless clinically contraindicated. Patient requires contact precautions if  hospitalized. CRITICAL RESULT CALLED TO, READ BACK BY AND  VERIFIED WITH: J MILLEN,PHARMD AT 1534 10/08/17 BY L BENFIELD    Methicillin resistance DETECTED (A) NOT DETECTED Final    Comment: CRITICAL RESULT CALLED TO, READ BACK BY AND VERIFIED WITH: J MILLEN,PHARMD AT 1534 10/08/17 BY L BENFIELD    Streptococcus species NOT DETECTED NOT DETECTED Final   Streptococcus agalactiae NOT DETECTED NOT DETECTED Final   Streptococcus pneumoniae NOT DETECTED NOT DETECTED Final   Streptococcus pyogenes NOT DETECTED NOT DETECTED Final   Acinetobacter baumannii NOT DETECTED NOT DETECTED Final   Enterobacteriaceae species DETECTED (A) NOT DETECTED Final    Comment: CRITICAL RESULT CALLED TO, READ BACK BY AND VERIFIED WITH: J MILLEN,PHARMD AT 1534 10/08/17 BY L BENFIELD    Enterobacter cloacae complex NOT DETECTED NOT DETECTED Final   Escherichia coli DETECTED (A) NOT DETECTED Final    Comment: CRITICAL RESULT CALLED TO, READ BACK BY AND VERIFIED WITH: J MILLEN,PHARMD AT 1534 10/08/17 BY L BENFIELD    Klebsiella oxytoca NOT DETECTED NOT DETECTED Final   Klebsiella pneumoniae NOT  DETECTED NOT DETECTED Final   Proteus species DETECTED (A) NOT DETECTED Final    Comment: CRITICAL RESULT CALLED TO, READ BACK BY AND VERIFIED WITH: J MILLEN,PHARMD AT 1534 10/08/17 BY L BENFIELD    Serratia marcescens NOT DETECTED NOT DETECTED Final   Carbapenem resistance NOT DETECTED NOT DETECTED Final   Haemophilus influenzae NOT DETECTED NOT DETECTED Final   Neisseria meningitidis NOT DETECTED NOT DETECTED Final   Pseudomonas aeruginosa NOT DETECTED NOT DETECTED Final   Candida albicans NOT DETECTED NOT DETECTED Final   Candida glabrata NOT DETECTED NOT DETECTED Final   Candida krusei NOT DETECTED NOT DETECTED Final   Candida parapsilosis NOT DETECTED NOT DETECTED Final   Candida tropicalis NOT DETECTED NOT DETECTED Final    Comment: Performed at [facility], [place] [address], [place], [place] [address]  Blood culture (routine x 2)     Status: Abnormal   Collection Time:  10/06/17 11:00 PM  Result Value Ref Range Status   Specimen Description BLOOD RIGHT ARM  Final   Special Requests   Final    BOTTLES DRAWN AEROBIC AND ANAEROBIC Blood Culture adequate volume   Culture  Setup Time   Final    GRAM POSITIVE COCCI IN CLUSTERS IN BOTH AEROBIC AND ANAEROBIC BOTTLES CRITICAL VALUE NOTED.  VALUE IS CONSISTENT WITH PREVIOUSLY REPORTED AND CALLED VALUE.    Culture (A)  Final    STAPHYLOCOCCUS AUREUS SUSCEPTIBILITIES PERFORMED ON PREVIOUS CULTURE WITHIN THE LAST 5 DAYS. Performed at [facility], [place] [address], [place], [place] [address]    Report Status 10/09/2017 FINAL  Final  C difficile quick scan w PCR reflex     Status: None   Collection Time: 10/07/17  4:20 AM  Result Value Ref Range Status   C Diff antigen NEGATIVE NEGATIVE Final   C Diff toxin NEGATIVE NEGATIVE Final   C Diff interpretation No C. difficile detected.  Final  Culture, blood (Routine X 2) w Reflex to ID Panel     Status: None   Collection Time: 10/08/17 11:06 AM  Result Value Ref Range Status   Specimen Description BLOOD LEFT ANTECUBITAL  Final   Special Requests   Final    BOTTLES DRAWN AEROBIC AND ANAEROBIC Blood Culture adequate volume   Culture   Final    NO GROWTH 5 DAYS Performed at [facility], [address] [place], [place] [address]    Report Status 10/13/2017 FINAL  Final  Culture, blood (Routine X 2) w Reflex to ID Panel     Status: None (Preliminary result)   Collection Time: 10/10/17  8:39 PM  Result Value Ref Range Status   Specimen Description BLOOD RIGHT HAND  Final   Special Requests IN PEDIATRIC BOTTLE Blood Culture adequate volume  Final   Culture   Final    NO GROWTH 2 DAYS Performed at [facility], [place] [address], [place], [place] [address]    Report Status PENDING  Incomplete  Culture, blood (Routine X 2) w Reflex to ID Panel     Status: None (Preliminary result)   Collection Time: 10/10/17  8:42 PM  Result Value Ref Range Status    Specimen Description BLOOD RIGHT HAND  Final   Special Requests IN PEDIATRIC BOTTLE Blood Culture adequate volume  Final   Culture   Final    NO GROWTH 2 DAYS Performed at [facility], [place] [address] [place], [place] [address]    Report  Status PENDING  Incomplete    Studies/Results: Dg Chest Port 1 View  Result Date: 10/13/2017 CLINICAL DATA:  Encounter for fever and pneumonia EXAM: PORTABLE CHEST 1 VIEW COMPARISON:  10/09/2017 FINDINGS: Tracheostomy tube in good position. Stable enlarged cardiac silhouette. There is dense LEFT basilar opacity. Fine interstitial pattern unchanged. No pneumothorax. IMPRESSION: 1. No significant change. 2. Dense LEFT lower lobe atelectasis versus infiltrate. 3. Fine interstitial edema. Electronically Signed   By: Suzy Bouchard M.D.   On: 10/13/2017 09:34    TEE 10/11/17: - No evidence of endocarditis.  Assessment/Plan:  INTERVAL HISTORY:  PICC was d/c'd 02/02 BCx 2/2 NGTDx5d - final (prior to PICC removal) BCx 2/4 NGTD (post PICC removal) Has hypernatremia to 154 today.  Active Problems:   AKI (acute kidney injury) ([place])   Chronic respiratory failure (HCC)   Septic shock (HCC)   Chronic obstructive pulmonary disease (HCC)   History of Clostridium difficile infection   Pressure injury of skin   MRSA bacteremia   E coli bacteremia   Bacterial infection due to Proteus mirabilis   Infection of peripherally inserted central venous catheter (PICC)   History of MDR Pseudomonas aeruginosa infection   ESBL (extended spectrum beta-lactamase) producing bacteria infection   Distended abdomen   Craig Oneal. is a 67 y.o. male with paraplegia, COPD, PAF, HFrEF, chronic respiratory failure s/p trach and PEG with multiple recent admissions for UTIs, MDR pseudomonas and Candidemia who presented to [facility] with septic shock, found to have MRSA and ESBL bacteremia. TEE w/o vegetations. 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 He hasn't had fever is 24hrs.  Plan: --as no other indication that fever is from new source, will continue monitoring for now --continue vanc and invanz; Vanc for at least 4 weeks, invanz 7-10 days post PICC removal (2/2) --repeat BCx prior to PICC removal is NGx5d final; f/u repeat blood cultures from 2/4 (post PICC removal) --will s/o - call with further questions   LOS: 7 days   [name]  PGY 2 Infectious Diseases [phone] 10/14/2017, 10:57 AM

## 2017-10-14 NOTE — Progress Notes (Addendum)
10:37am- CSW received call from [place] at [facility] and was informed that they can take pt back today at 3pm. CSW has updated RN of this as well as pt's daughter who expressed that she would at the hospital around 1 today. CSW to set up discharge needs once d/c summary has been received.   CSW still has not received return called from other facilities on taking pt at this time. CSW spoke with Misty StanleyStacey at [facility] this morning and was informed that they have a meeting at 9:30am to discuss who will be discharging. Misty StanleyStacey expressed that she would call CSW soon after to let CSW know about taking pt back today. CSW continuing to follow for needs at this time.    Claude MangesKierra S. Maryclare Nydam, MSW, LCSW-A Emergency Department Clinical Social Worker [phone]

## 2017-10-16 LAB — CULTURE, BLOOD (ROUTINE X 2)
CULTURE: NO GROWTH
CULTURE: NO GROWTH
Special Requests: ADEQUATE
Special Requests: ADEQUATE

## 2018-06-06 IMAGING — DX DG ABD PORTABLE 2V
3 series · 3 of 3 positions shown · non-contrast
Comparison: Abdominal radiograph August 17, 2017

CLINICAL DATA: Shortness of breath, on treatment for urinary tract
infection. No bowel movement for 4 days.

EXAM:
PORTABLE ABDOMEN - 2 VIEW

[abdomen erect]
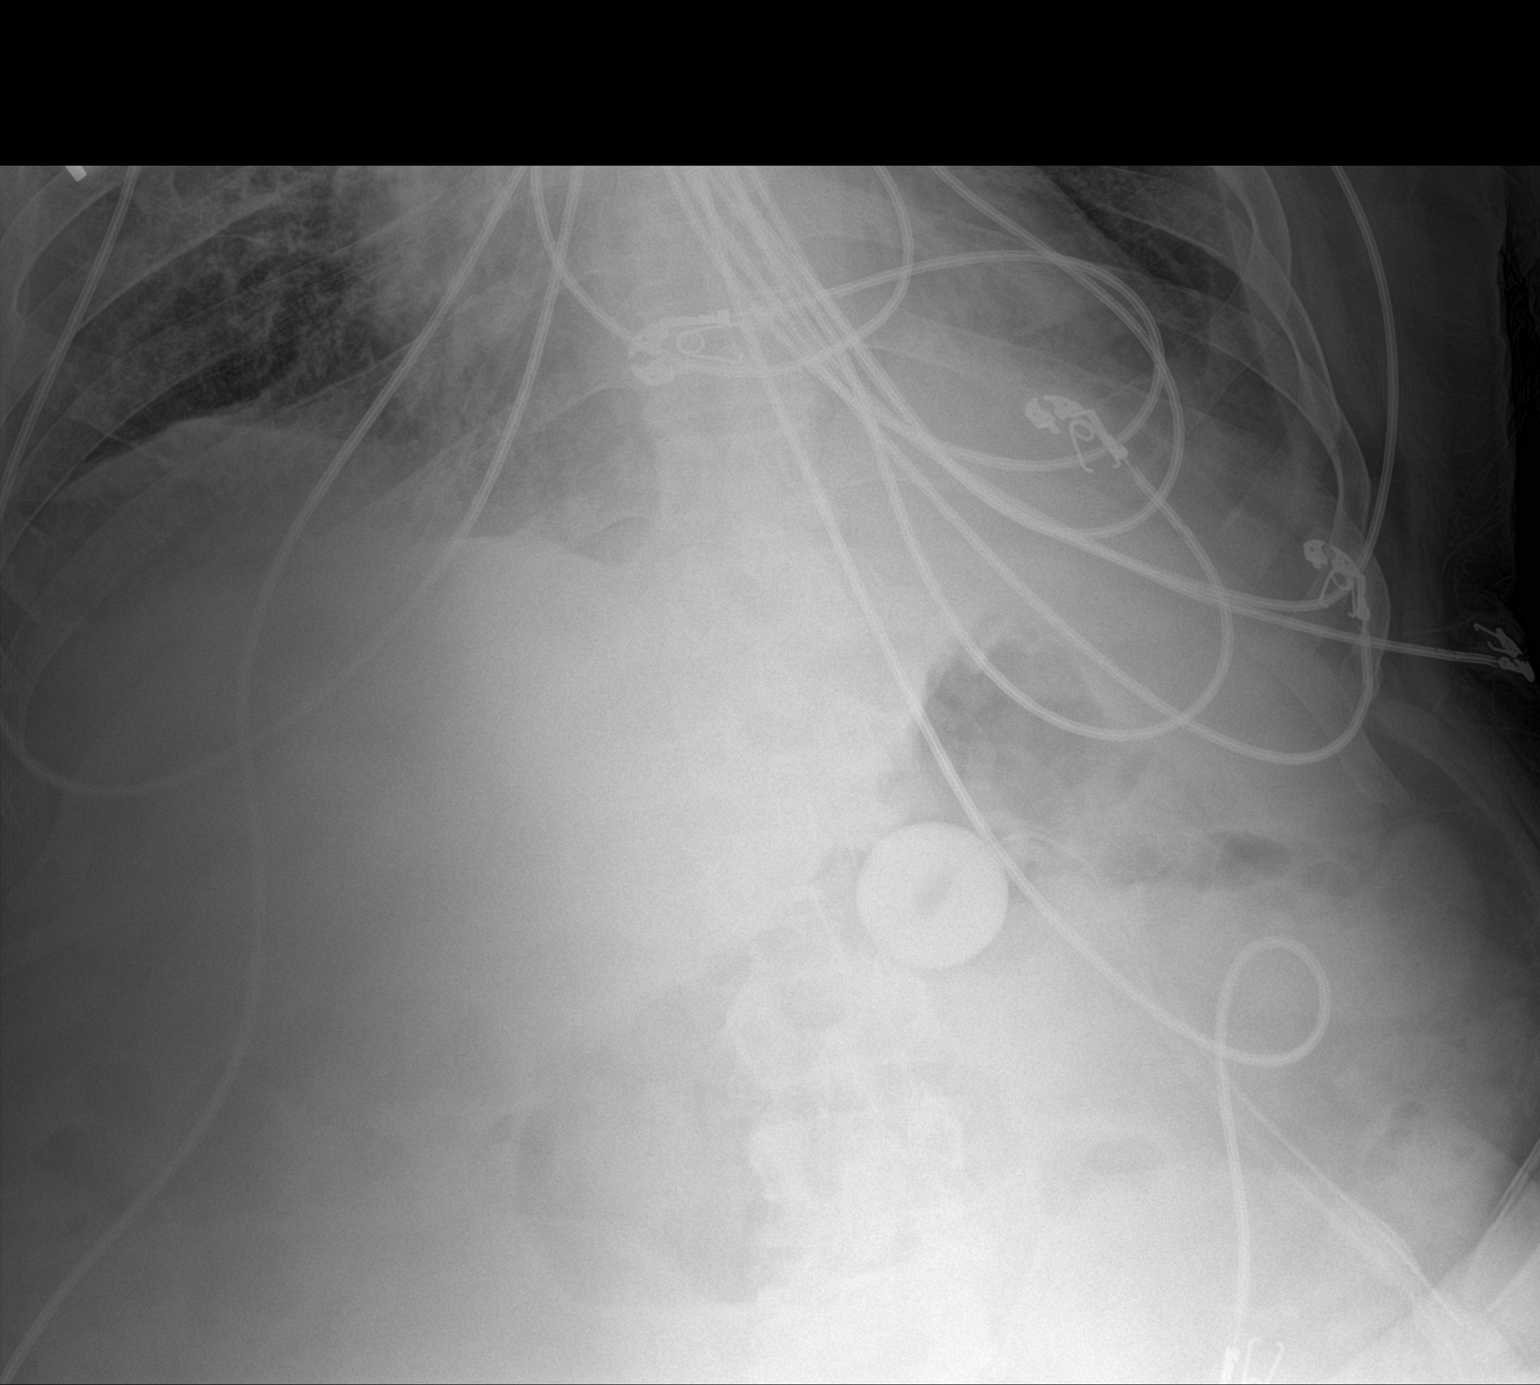

[abdomen supine (1 of 2)]
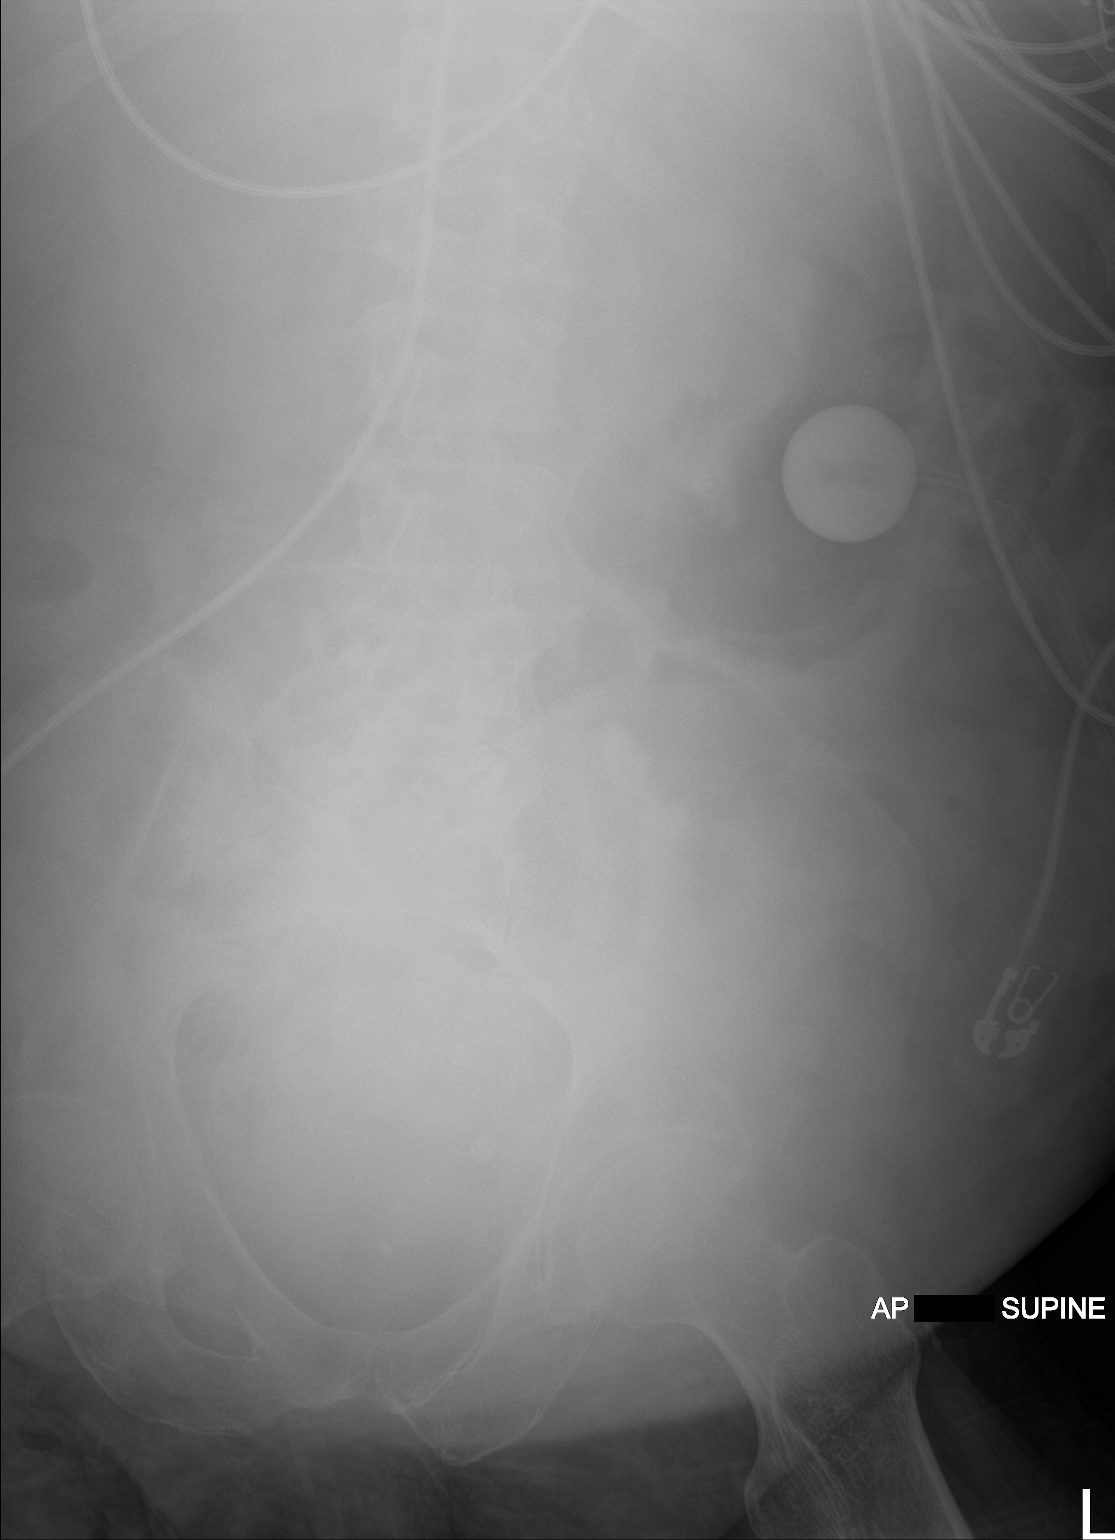

[abdomen supine (2 of 2)]
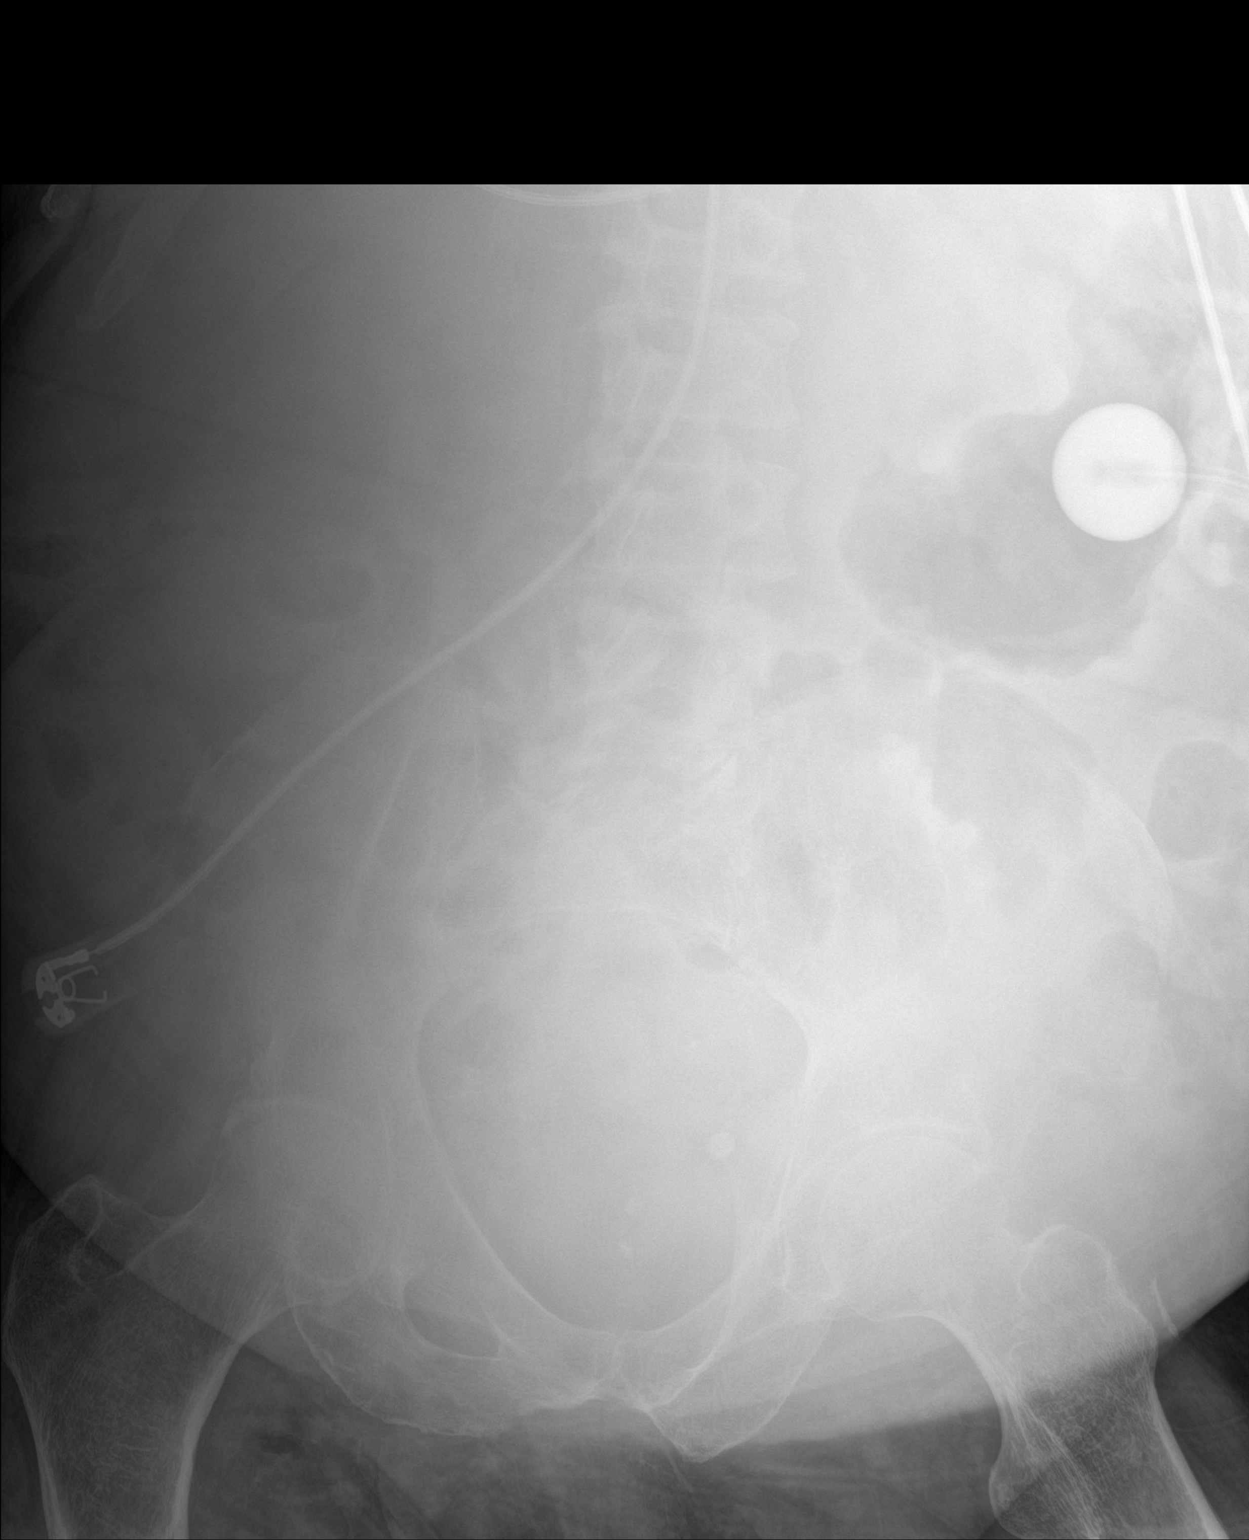

[3 of 3 positions shown; findings below may reference images not displayed]

FINDINGS: Gastrostomy tube projects in stomach. Bowel gas pattern is
nondilated and nonobstructive. Large body habitus. Phleboliths
project in the pelvis. No intra-abdominal mass effect. Limited
assessment for free air due to habitus. Soft tissue planes and
included osseous structures are unchanged.
IMPRESSION: Nonspecific bowel gas pattern.  Gastrostomy tube in situ.

## 2018-06-28 IMAGING — CR DG ABD PORTABLE 1V
2 series · 2 of 2 positions shown · non-contrast
Comparison: CT abdomen and pelvis 09/18/2017.

CLINICAL DATA: Abdominal distention.

EXAM:
PORTABLE ABDOMEN - 1 VIEW

[AP (1 of 2)]
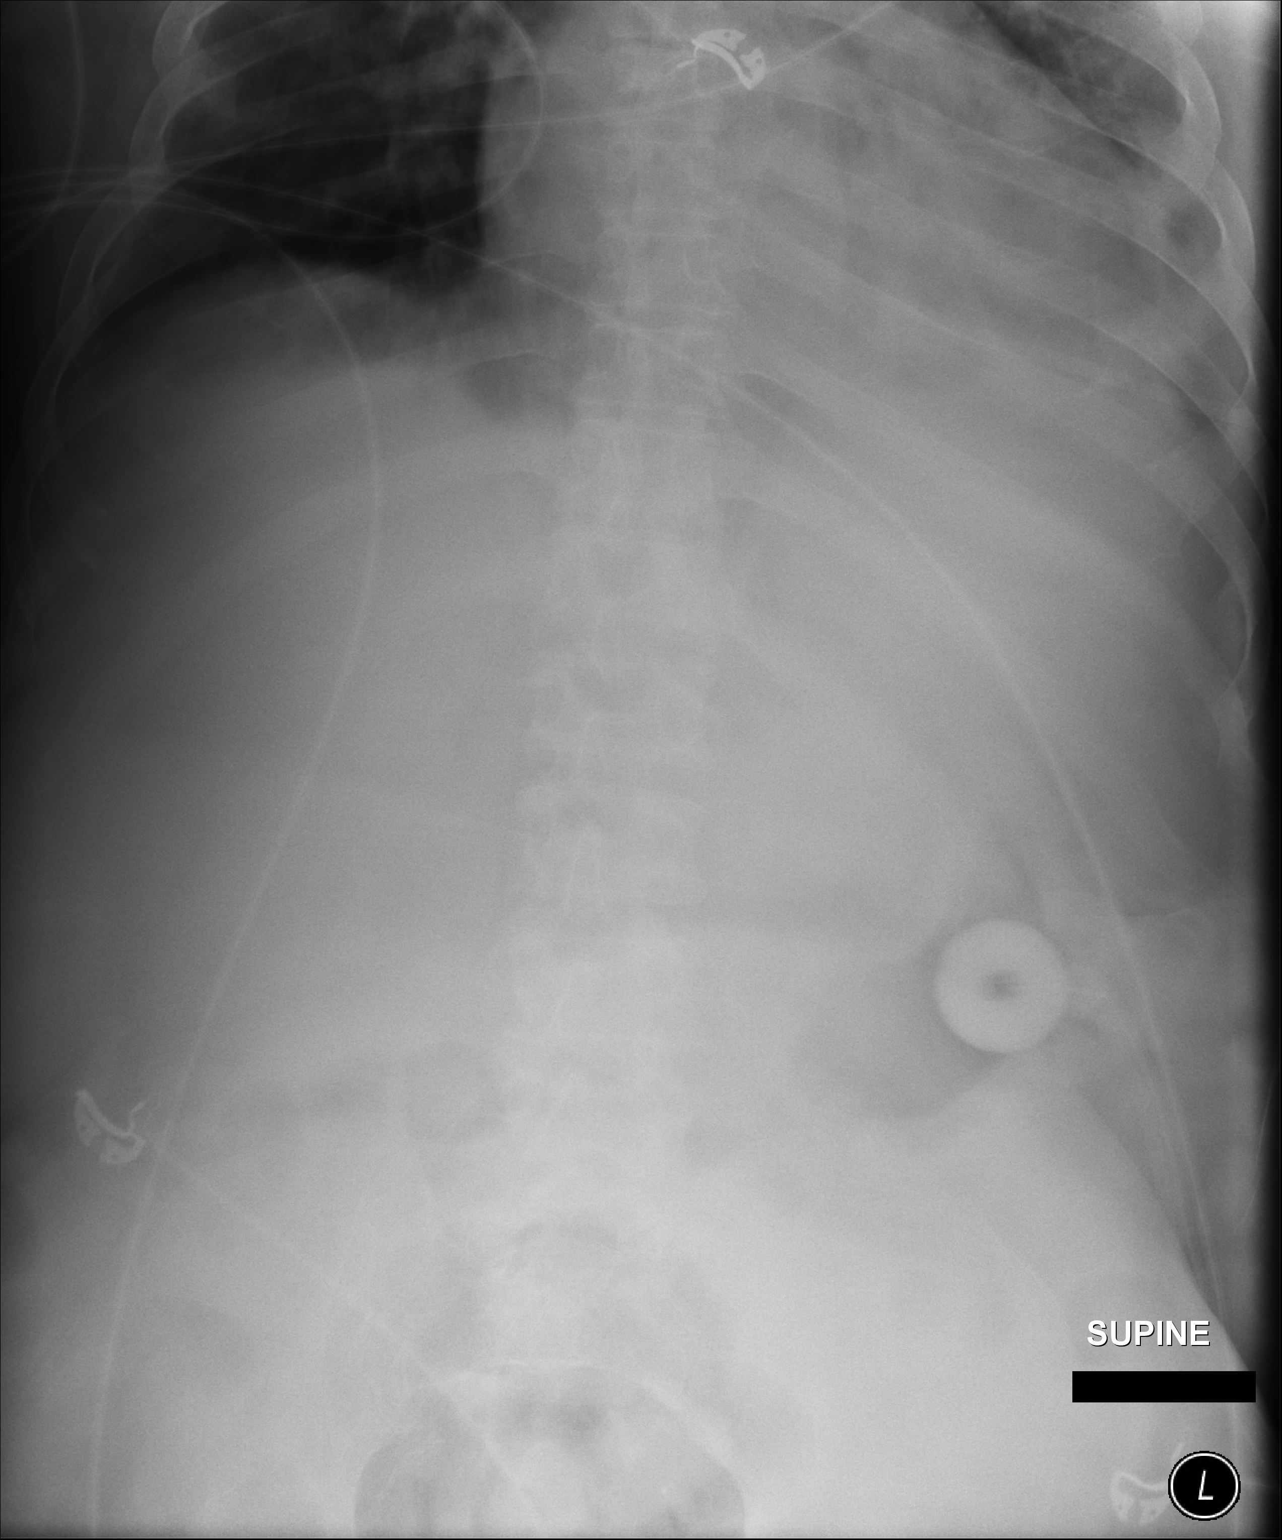

[AP (2 of 2)]
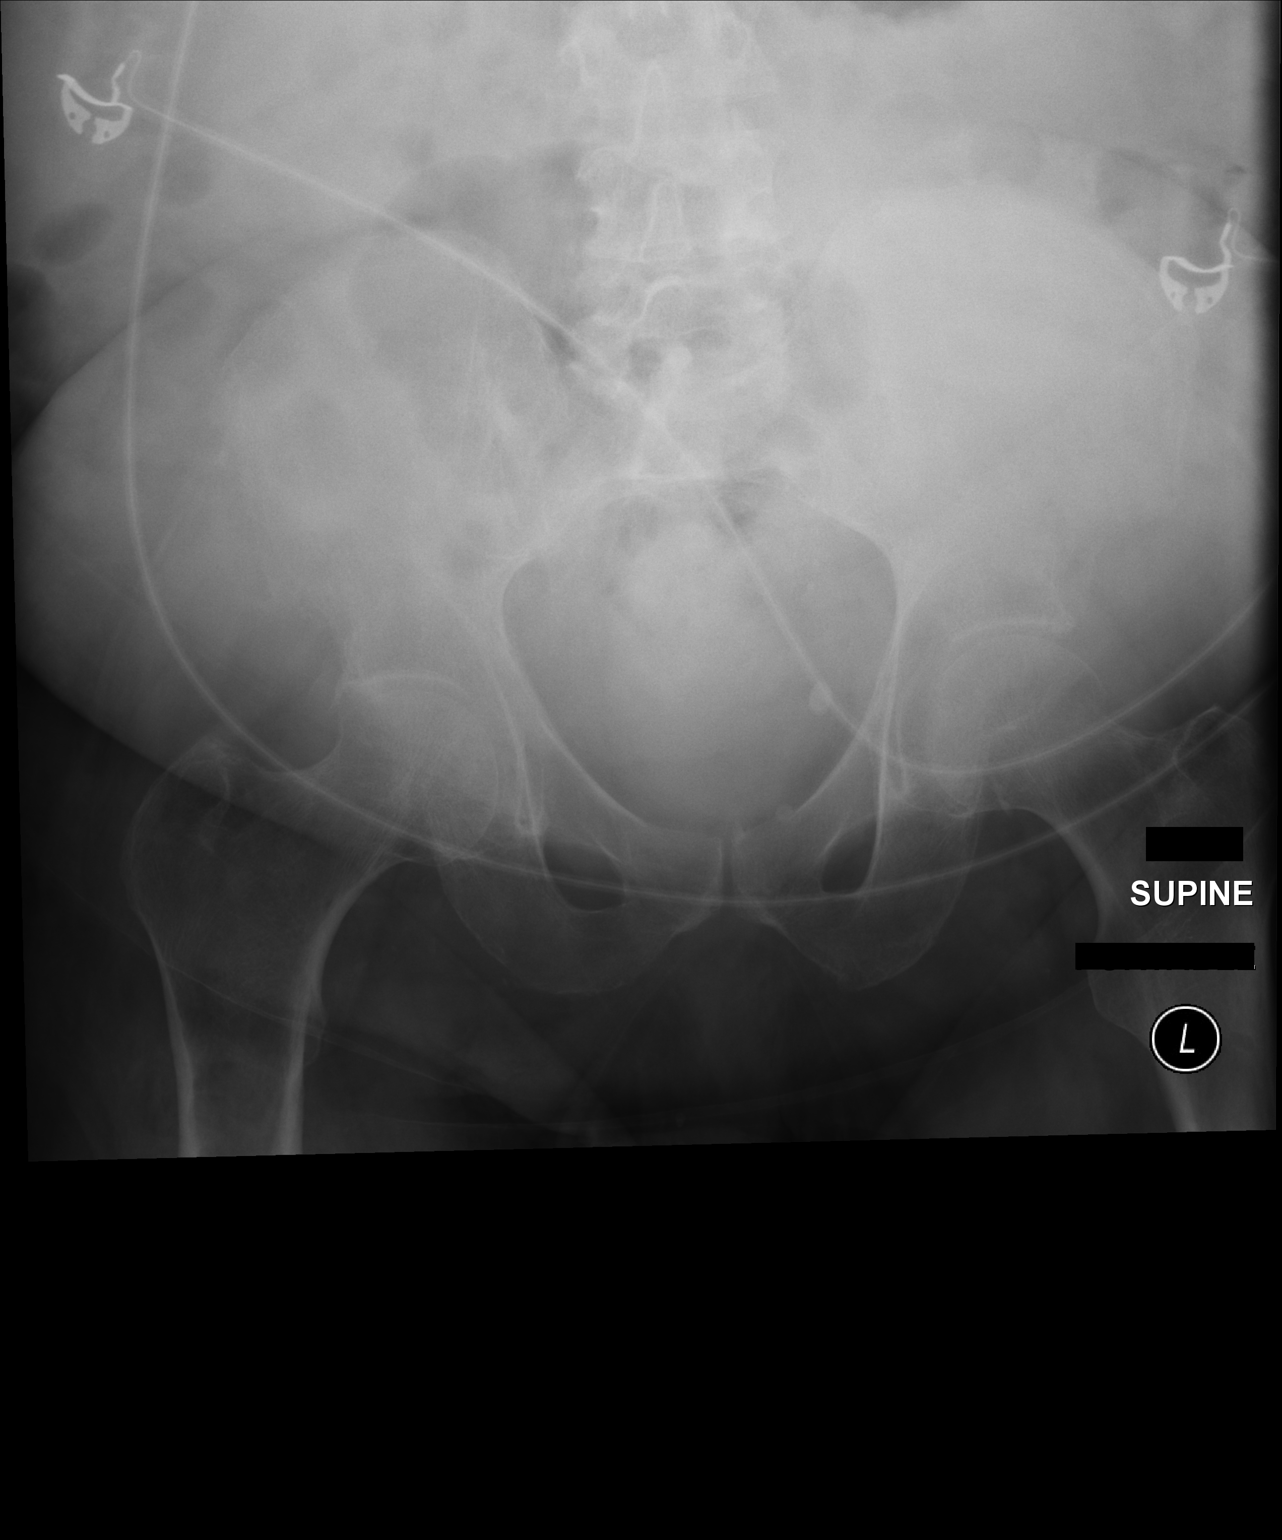

[2 of 2 positions shown; findings below may reference images not displayed]

FINDINGS: No free intraperitoneal air is identified. The bowel gas pattern is
nonobstructive. Feeding tube is noted. No abnormal abdominal
calcification or acute bony abnormality.
IMPRESSION: Negative exam.

## 2018-07-01 IMAGING — DX DG CHEST 1V PORT
1 series · 1 of 1 positions shown · non-contrast
Comparison: 10/09/2017

CLINICAL DATA: Encounter for fever and pneumonia

EXAM:
PORTABLE CHEST 1 VIEW

[chest ap]
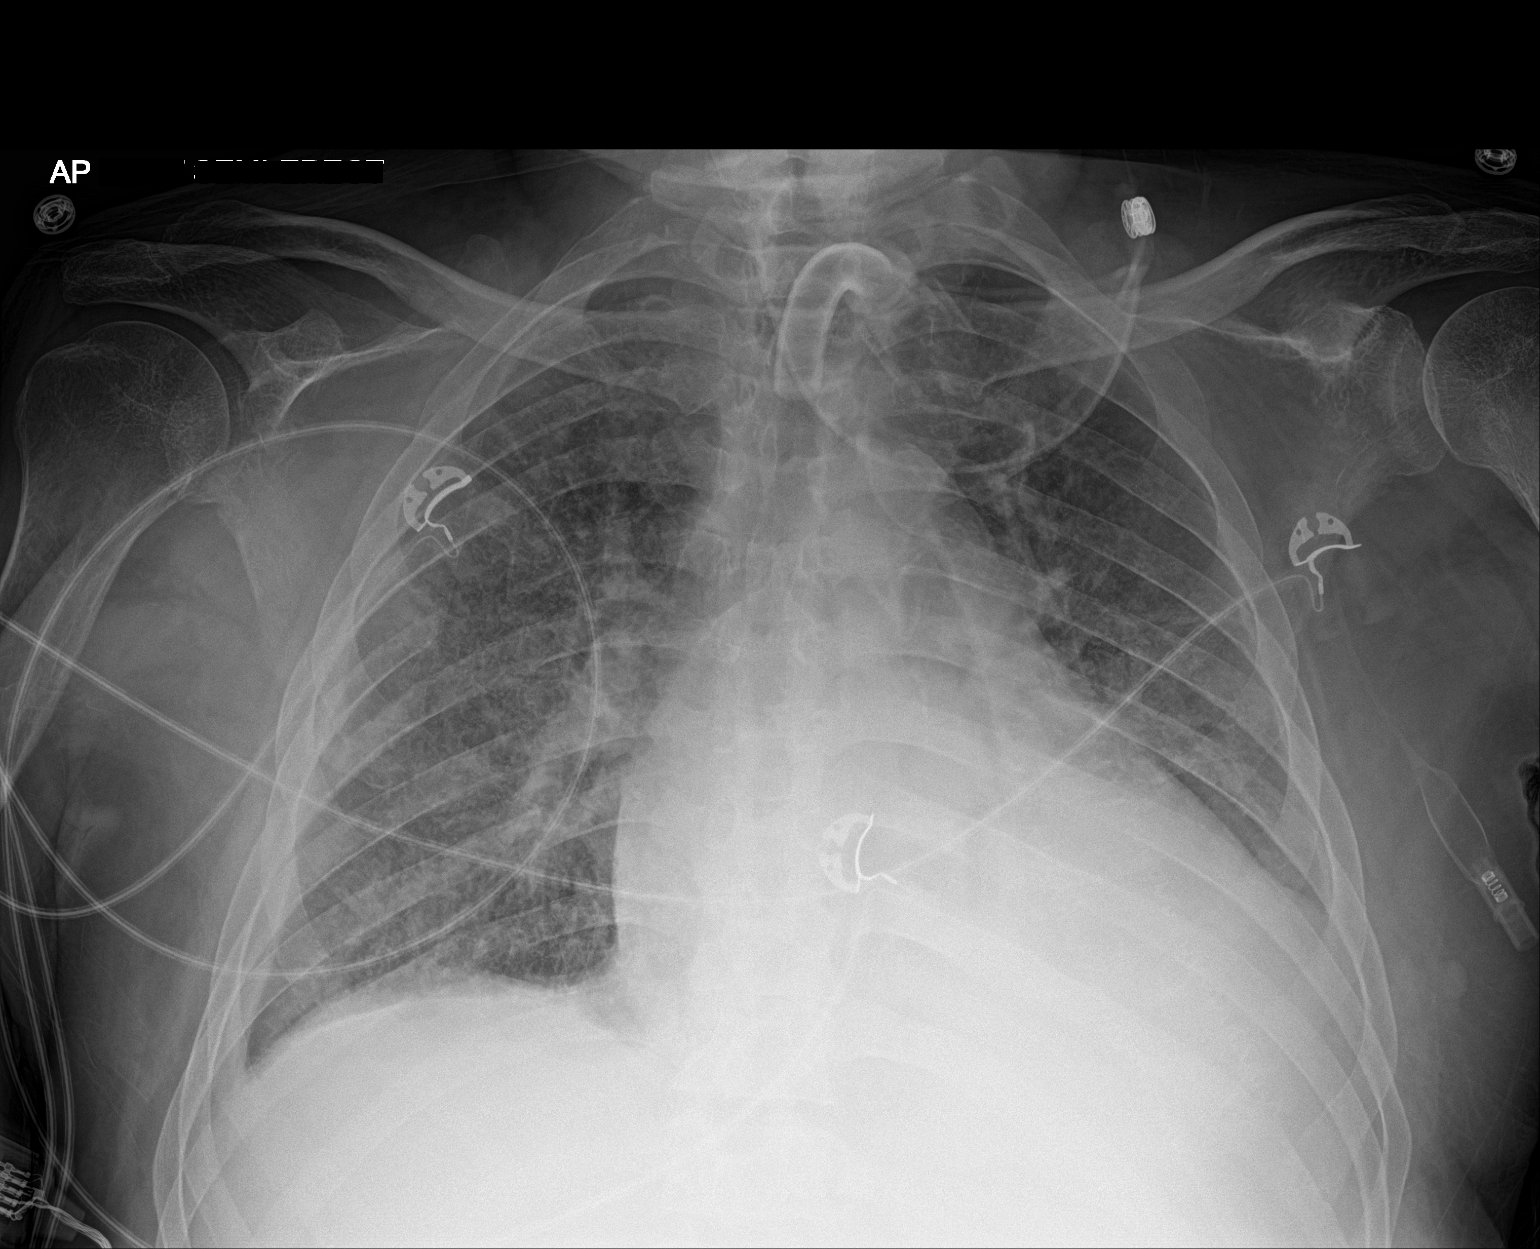

[1 of 1 positions shown; findings below may reference images not displayed]

FINDINGS: Tracheostomy tube in good position. Stable enlarged cardiac
silhouette. There is dense LEFT basilar opacity. Fine interstitial
pattern unchanged. No pneumothorax.
IMPRESSION: 1. No significant change.
2. Dense LEFT lower lobe atelectasis versus infiltrate.
3. Fine interstitial edema.

## 2018-08-06 DEATH — deceased
